# Patient Record
Sex: Female | Born: 1940 | Race: White | Hispanic: No | Smoking: Former smoker
Health system: Southern US, Community
[De-identification: ages and names within clinical notes are randomized; demographics above are authoritative.]

## PROBLEM LIST (undated history)

## (undated) DIAGNOSIS — Z952 Presence of prosthetic heart valve: Secondary | ICD-10-CM

## (undated) DIAGNOSIS — E039 Hypothyroidism, unspecified: Secondary | ICD-10-CM

## (undated) DIAGNOSIS — Z9289 Personal history of other medical treatment: Secondary | ICD-10-CM

## (undated) DIAGNOSIS — M810 Age-related osteoporosis without current pathological fracture: Secondary | ICD-10-CM

## (undated) DIAGNOSIS — K635 Polyp of colon: Secondary | ICD-10-CM

## (undated) DIAGNOSIS — I10 Essential (primary) hypertension: Secondary | ICD-10-CM

## (undated) DIAGNOSIS — F419 Anxiety disorder, unspecified: Secondary | ICD-10-CM

## (undated) DIAGNOSIS — Z8701 Personal history of pneumonia (recurrent): Secondary | ICD-10-CM

## (undated) DIAGNOSIS — K5732 Diverticulitis of large intestine without perforation or abscess without bleeding: Secondary | ICD-10-CM

## (undated) DIAGNOSIS — J449 Chronic obstructive pulmonary disease, unspecified: Secondary | ICD-10-CM

## (undated) DIAGNOSIS — R197 Diarrhea, unspecified: Secondary | ICD-10-CM

## (undated) DIAGNOSIS — I350 Nonrheumatic aortic (valve) stenosis: Secondary | ICD-10-CM

## (undated) DIAGNOSIS — K219 Gastro-esophageal reflux disease without esophagitis: Secondary | ICD-10-CM

## (undated) DIAGNOSIS — M479 Spondylosis, unspecified: Secondary | ICD-10-CM

## (undated) DIAGNOSIS — E782 Mixed hyperlipidemia: Secondary | ICD-10-CM

## (undated) HISTORY — DX: Nonrheumatic aortic (valve) stenosis: I35.0

## (undated) HISTORY — PX: OTHER SURGICAL HISTORY: SHX169

## (undated) HISTORY — DX: Polyp of colon: K63.5

## (undated) HISTORY — DX: Personal history of pneumonia (recurrent): Z87.01

## (undated) HISTORY — DX: Mixed hyperlipidemia: E78.2

## (undated) HISTORY — DX: Essential (primary) hypertension: I10

## (undated) HISTORY — DX: Diverticulitis of large intestine without perforation or abscess without bleeding: K57.32

## (undated) HISTORY — PX: LUMBAR DISC SURGERY: SHX700

## (undated) HISTORY — DX: Gastro-esophageal reflux disease without esophagitis: K21.9

## (undated) HISTORY — DX: Age-related osteoporosis without current pathological fracture: M81.0

## (undated) HISTORY — PX: APPENDECTOMY: SHX54

## (undated) HISTORY — DX: Chronic obstructive pulmonary disease, unspecified: J44.9

---

## 1970-10-18 HISTORY — PX: ABDOMINAL HYSTERECTOMY: SUR658

## 1998-08-20 ENCOUNTER — Inpatient Hospital Stay (HOSPITAL_COMMUNITY): Admission: EM | Admit: 1998-08-20 | Discharge: 1998-08-21 | Payer: Self-pay | Admitting: Cardiovascular Disease

## 1998-08-21 ENCOUNTER — Encounter: Payer: Self-pay | Admitting: Cardiovascular Disease

## 1999-05-29 ENCOUNTER — Ambulatory Visit (HOSPITAL_COMMUNITY): Admission: RE | Admit: 1999-05-29 | Discharge: 1999-05-29 | Payer: Self-pay | Admitting: Critical Care Medicine

## 1999-12-22 ENCOUNTER — Ambulatory Visit (HOSPITAL_COMMUNITY): Admission: RE | Admit: 1999-12-22 | Discharge: 1999-12-22 | Payer: Self-pay | Admitting: Gastroenterology

## 1999-12-22 ENCOUNTER — Encounter: Payer: Self-pay | Admitting: Gastroenterology

## 2005-10-07 ENCOUNTER — Ambulatory Visit: Payer: Self-pay | Admitting: Internal Medicine

## 2005-10-15 ENCOUNTER — Ambulatory Visit: Payer: Self-pay | Admitting: Internal Medicine

## 2005-10-15 ENCOUNTER — Ambulatory Visit (HOSPITAL_COMMUNITY): Admission: RE | Admit: 2005-10-15 | Discharge: 2005-10-15 | Payer: Self-pay | Admitting: Internal Medicine

## 2005-10-15 HISTORY — PX: COLONOSCOPY: SHX174

## 2005-10-15 HISTORY — PX: ESOPHAGOGASTRODUODENOSCOPY: SHX1529

## 2007-10-19 HISTORY — PX: ROTATOR CUFF REPAIR: SHX139

## 2007-11-02 ENCOUNTER — Ambulatory Visit (HOSPITAL_BASED_OUTPATIENT_CLINIC_OR_DEPARTMENT_OTHER): Admission: RE | Admit: 2007-11-02 | Discharge: 2007-11-02 | Payer: Self-pay | Admitting: Orthopedic Surgery

## 2008-06-11 ENCOUNTER — Ambulatory Visit: Payer: Self-pay | Admitting: Cardiology

## 2010-10-23 ENCOUNTER — Encounter (INDEPENDENT_AMBULATORY_CARE_PROVIDER_SITE_OTHER): Payer: Self-pay

## 2010-11-19 NOTE — Letter (Signed)
Summary: Recall Colonoscopy/Endoscopy, Change to Office Visit  [facility]  [address]   [place], [place] [address]   Phone: [phone]  Fax: [phone]      October 23, 2010   April Hull [address] [place], [place]  [address] April 01, 1941   Dear [name],   According to our records, it is time for you to schedule a Colonoscopy. However, after reviewing your medical record, we recommend an office visit in order to determine your need for a repeat procedure.  Please call [phone] at your convenience to schedule an office visit. If you have any questions or concerns, please feel free to contact our office.   Sincerely,   Cloria Spring LPN  [facility] Ph: [phone]   Fax: [phone]

## 2011-03-02 NOTE — Assessment & Plan Note (Signed)
[facility]                          [facility] OFFICE NOTE   April, Hull                       MRN:          [id number]  DATE:06/11/2008                            DOB:          1941-03-17    PRIMARY CARDIOLOGIST:  Jonelle Sidle, MD (new)   REFERRING PHYSICIAN:  Doreen Beam, MD   REASON FOR CONSULTATION:  April Hull is a very pleasant 70 year old  female, with no documented history of CAD, and whose husband was a long-  time patient of ours.  He passed away approximately 2 months ago and [name] is still experiencing significant grief.  It is in this context  that she presented to Dr. Sherril Croon recently, complaining of palpitations and  shortness of breath.  Preliminary workup consisted of both a 2-D echo  and an exercise stress Cardiolite.  She is now referred to Dr. [name] for further evaluation and recommendations.   The 2-D echo yielded normal LVF with no focal wall motion abnormalities  and no significant valvular disease.  The exercise stress Cardiolite,  during which the patient exercised for 7.5 minutes with no associated  chest pain, yielded normal perfusion and normal function (EF 53%).  There were no significant associated EKG changes during testing.   Clinically, April Hull informs me that predominately she has been  experiencing occasional palpitations, with some mild associated  symptoms.  However, these are not new and, in fact, she had been  evaluated for this symptom some 10 years ago here in our [facility].  She recalls being placed on an event monitor, which was reportedly  negative.   April Hull also refers to a remote cardiac catheterization at [facility],  some 10 years ago, which was also reportedly normal.   Clinically, she feels that she has noted some worsening shortness of  breath these past several months.  With respect to her chest pain, she  does report occasional chest pressure and  tightness when walking a  flight of stairs.  However, she did reiterate that she did not  experience any chest pain during her recent exercise stress test.   EKG in our office today reveals NSR at 79 bpm with normal axis and  nonspecific ST abnormalities.   CURRENT MEDICATIONS:  1. Levothyroxine 0.112 mg daily.  2. Nexium 20 inches daily.   PAST MEDICAL HISTORY:  1. GERD.  2. COPD.  3. Hypothyroidism.  4. Status post esophageal dilatation in 2006.  5. Status post lower back surgery.  6. Partial hysterectomy.  7. Longstanding palpitations.   SOCIAL HISTORY:  The patient is recently widowed, has 2 grown children.  She smokes half-a-pack a day.  Denies alcohol use.  Drinks 1 cup of  regular coffee a day as well as some occasional chocolate.  Typically,  does not drink regular tea.   FAMILY HISTORY:  Mother deceased age 61, with heart problems.  Six  brothers are deceased, 2 of whom had heart disease with 1 having  undergone CABG in his 30s.  The other died at age 4, fatal myocardial  infarction.   REVIEW OF SYSTEMS:  Denies history of myocardial infarction, congestive  heart failure, or stroke.  Denies history of hypertension or diabetes  mellitus.  Does have significant reflux disease.  Remaining systems  negative.   PHYSICAL EXAMINATION:  VITAL SIGNS:  Blood pressure currently 160/89,  pulse 81, regular weight 130.  GENERAL:  70 year old female, sitting upright, no distress.  HEENT:  Normocephalic, atraumatic.  NECK:  Palpable bilateral carotid pulses without bruits; no JVD.  LUNGS:  Clear to auscultation in all fields.  HEART:  RRR (S1S2) no significant murmurs.  No rubs or gallops.  ABDOMEN:  Soft, nontender, intact bowel sounds.  No bruits or pulsatile  epigastric mass.  EXTREMITIES:  Palpable bilateral femoral pulse without bruits.  Intact  distal pulses without edema.  No focal deficit.   Recent lab data notable for normal electrolytes, renal function, and  liver  enzymes.  Total cholesterol 293, triglyceride 146, HDL 46 and LDL  218.  TSH normal, CBC normal.   IMPRESSION:  1. Atypical chest pain.      a.     Recent normal adequate exercise stress Cardiolite; ejection       fraction 53%.      b.     Reportedly normal remote cardiac catheterization.  2. Exertional dyspnea.      a.     Normal left ventricular function.  3. Longstanding palpitations.  4. COPD/longstanding tobacco.  5. Hypertension.  6. Dyslipidemia.  7. Gastroesophageal reflux disease.   PLAN:  1. No further cardiac workup is recommended at this point in time.      The patient presents with atypical symptoms and had a recent      adequate stress test, which showed no definite evidence of      ischemia.  With respect to her palpitations, she is disinclined for      a reevaluation with a repeat monitor.  If, however, her episodes      increase in either frequency or intensity, then she is amenable to      having further evaluation with an event monitor.  2. Recommend starting low-dose aspirin 81 mg daily, for primary      prevention.  We will refer to Dr. Sherril Croon regarding treatment of      possible new onset hypertension.  I have instructed April Hull to      closely monitor this with outpatient ambulatory blood pressure      readings.  We also discussed the importance of lipid management,      and suggested initial treatment with diet/exercise regimen.  3. We also addressed the importance of smoking cessation, but      acknowledged that she continues to be in a grieving state over the      recent loss of her husband.  She clearly understands the long-term      impact of continued tobacco smoking, however.  4. We will have the patient return to Dr. [name] on an as-      needed basis.      Rozell Searing, PA-C  Electronically Signed      Jonelle Sidle, MD  Electronically Signed   GS/MedQ  DD: 06/11/2008  DT: 06/12/2008  Job #: [id number]

## 2011-03-02 NOTE — Op Note (Signed)
April Hull, April Hull                ACCOUNT NO.:  [id number]   MEDICAL RECORD NO.:  [id number]          PATIENT TYPE:  AMB   LOCATION:  DSC                          FACILITY:  [facility]   PHYSICIAN:  Rodney A. Mortenson, M.D.DATE OF BIRTH:  09-Oct-1941   DATE OF PROCEDURE:  11/02/2007  DATE OF DISCHARGE:                               OPERATIVE REPORT   JUSTIFICATION FOR PROCEDURE:  This 70 year old female presents with a  three-month history of left shoulder, left arm pain.  She has been  evaluated, has weakness to abduction and external rotation the shoulder.  An MRI shows arthritis of the glenohumeral joint and retracted tear of  the supraspinatus.  Because of persistent pain and discomfort, she was  now admitted for surgical reconstruction.  Complication discussed  preoperatively.  Questions answered and encouraged.   PREOPERATIVE DIAGNOSES:  1. Osteoarthritis, glenohumeral joint left shoulder.  2. Torn labrum.  3. Retracted tear supraspinatus left shoulder.   POSTOPERATIVE DIAGNOSES:  1. Osteoarthritis, glenohumeral joint left shoulder.  2. Torn labrum.  3. Retracted tear supraspinatus left shoulder.   OPERATION:  Arthroscopic debridement of the labrum; open acromioplasty  and open repair of rotator cuff left shoulder using Mitek anchor.   SURGEON:  Lenard Galloway. Chaney Malling, M.D.   ANESTHESIA:  General.   PROCEDURE:  The patient was placed on the operating table in the supine  position.  After satisfactory of general anesthesia, the patient was  placed in semi-sitting position.  The left shoulder and upper extremity  was prepped with DuraPrep and draped out in the usual manner.  Through  the standard posterior portal, an arthroscope was introduced and an  anterior operative portal was inserted.  Very careful examination of the  shoulder was undertaken.  There was an large area of total loss of  articular cartilage over the glenoid and a marked tearing of the  anterior labrum but  this was not torn.  Through the anterior portal, the  labrum was debrided with a full radius resector.  The posterior labrum  had some fraying and tearing.  This was also debrided.  A tear in the  rotator cuff could be seen just posterior to the biceps tendon.  The  biceps tendon itself appeared normal and the articular cartilage of the  humeral head was fairly normal.  This was a full thickness tear of the  supraspinatus.  The arthroscope was then removed.   Saber cut incision made over the anterolateral aspect of left shoulder.  Skin edges were retracted.  About 1 cm deltoid was released off the  anterior aspect of the acromion and a saw was used to do a small  acromioplasty.  Once this was accomplished, a bursectomy was completed.  There was a defect in the supraspinatus just posterior to the biceps  tendon.  This was opened and this was pulled off the footprint.  The  area of footprint was debrided with a rongeur.  The frayed and torn  material was then debrided sharply with a knife.  A Mitek anchor placed  over the footprint and the rotator cuff was  advanced distally and tacked  down in the anatomic position directly over the footprint.  This had an  excellent repair, good range of motion without impingement was  accomplished.  Again, this had an excellent stable repair.  Deltoid  fibers then reattached with 0 Vicryl, 2-0 Vicryl used to close the  subcutaneous tissue and stainless steel staples used to close the skin.  Sterile dressings were applied and the patient returned to the recovery  room doing extremely well.   DISPOSITION:  1. Discharge to home.  2. She may get out of the sling some during the day but wear it at      night.  3. Percocet for pain.  4. Follow-up in [place], [place], on Wednesday.      Rodney A. Chaney Malling, M.D.  Electronically Signed     RAM/MEDQ  D:  11/02/2007  T:  11/02/2007  Job:  [id number]   cc:   Doreen Beam, MD

## 2011-03-05 NOTE — Op Note (Signed)
[name], April Hull                ACCOUNT NO.:  [id number]   MEDICAL RECORD NO.:  [id number]          PATIENT TYPE:  AMB   LOCATION:  DAY                           FACILITY:  [facility]   PHYSICIAN:  Lionel December, M.D.    DATE OF BIRTH:  [birth year]   DATE OF PROCEDURE:  10/15/2005  DATE OF DISCHARGE:                                 OPERATIVE REPORT   PROCEDURE:  Esophagogastroduodenoscopy with esophageal dilation followed by  colonoscopy.   [name] is a 70 year old Caucasian female with chronic GERD whose symptoms  are poorly controlled with PPI. She also complains of intermittent solid  food dysphagia. She has a history of colonic polyps and overdue for  surveillance exam. She also has intermittent hematochezia.  Procedures were  reviewed the patient, informed consent was obtained.   MEDICINES FOR CONSCIOUS SEDATION:  Cetacaine spray pharyngeal topical  anesthesia, Demerol 25 milligrams IV, Versed 7 milligrams IV in divided  dose.   FINDINGS:  Procedures performed in endoscopy suite. The patient's vital  signs and O2 sat were monitored during procedure and remained stable.   PROCEDURE IN DETAIL:  Esophagogastroduodenoscopy. The patient was placed in  the left lateral position and Olympus videoscope was passed via oropharynx  without any difficulty into esophagus.  Mucosa of the esophagus was normal.  GE junction was at 41 cm from the incisors and was unremarkable.   Stomach. It was empty and distended very well with insufflation. Folds of  the proximal stomach were normal. Examination of mucosa at body, antrum,  pyloric channel, as well as angularis, fundus and cardia was normal.   Duodenum.  Bulbar mucosa was normal. Scope was passed and second part of the  duodenum where mucosa and folds were normal. Endoscope was withdrawn.   Esophagus was dilated by passing 54-French Maloney dilator to the full  insertion. As the dilator was withdrawn, endoscope was passed again and  esophageal mucosa reexamined and there was no disruption noted. Endoscope  was withdrawn and the patient prepared for procedure #2.   Colonoscopy. Rectal examination performed. No abnormality noted in external  or digital exam. Olympus videoscope was placed at the rectum and advanced  under vision into sigmoid colon beyond. Preparation was satisfactory.  Scattered diverticula noted at sigmoid colon. Scope was passed in the cecum  which was identified by appendiceal stump and ileocecal valve. Pictures  taken for the record. As the scope was withdrawn, colonic mucosa was  carefully examined. A single small polyp at transverse colon which was  ablated via cold biopsy. Mucosa and rest of colon and rectum was normal.  Scope was retroflexed to examine anorectal junction which was unremarkable.  There was thickening to anal papilla, otherwise normal. Endoscope was then  withdrawn. The patient tolerated the procedure well.   FINAL DIAGNOSES:  1.  Normal esophagogastroduodenoscopy.  2.  Esophagus dilated given history of dysphagia.  3.  Sigmoid colon diverticulosis.  4.  A small polyp ablated via cold biopsy from the transverse colon.   RECOMMENDATIONS:  1.  Antireflux measures. She needs to make every effort to quit cigarette  smoking which would help.  2.  Will increase Protonix to 40 milligrams p.o. b.i.d., prescription given      for a month with two refills.  3.  High-fiber diet. I will be contacting the patient with biopsy results      and plan to see him back in the office in 12 weeks.      Lionel December, M.D.  Electronically Signed     NR/MEDQ  D:  10/15/2005  T:  10/15/2005  Job:  [id number]   cc:   Dr. Doyne Keel

## 2011-07-08 LAB — POCT HEMOGLOBIN-HEMACUE: Hemoglobin: 13

## 2013-01-29 ENCOUNTER — Encounter: Payer: Self-pay | Admitting: Cardiology

## 2013-02-02 ENCOUNTER — Encounter: Payer: Self-pay | Admitting: Cardiology

## 2013-02-02 ENCOUNTER — Ambulatory Visit (INDEPENDENT_AMBULATORY_CARE_PROVIDER_SITE_OTHER): Payer: Medicare Other | Admitting: Cardiology

## 2013-02-02 VITALS — BP 139/82 | HR 80 | Ht 63.0 in | Wt 132.8 lb

## 2013-02-02 DIAGNOSIS — I350 Nonrheumatic aortic (valve) stenosis: Secondary | ICD-10-CM | POA: Insufficient documentation

## 2013-02-02 DIAGNOSIS — Z720 Tobacco use: Secondary | ICD-10-CM

## 2013-02-02 DIAGNOSIS — F17200 Nicotine dependence, unspecified, uncomplicated: Secondary | ICD-10-CM

## 2013-02-02 DIAGNOSIS — J449 Chronic obstructive pulmonary disease, unspecified: Secondary | ICD-10-CM | POA: Insufficient documentation

## 2013-02-02 DIAGNOSIS — I10 Essential (primary) hypertension: Secondary | ICD-10-CM

## 2013-02-02 DIAGNOSIS — J441 Chronic obstructive pulmonary disease with (acute) exacerbation: Secondary | ICD-10-CM

## 2013-02-02 DIAGNOSIS — I359 Nonrheumatic aortic valve disorder, unspecified: Secondary | ICD-10-CM

## 2013-02-02 NOTE — Patient Instructions (Addendum)
Your physician recommends that you schedule a follow-up appointment in: 3 months. Your physician recommends that you continue on your current medications as directed. Please refer to the Current Medication list given to you today. Your physician has requested that you have an echocardiogram in 3 months just before your next visit. Echocardiography is a painless test that uses sound waves to create images of your heart. It provides your doctor with information about the size and shape of your heart and how well your heart's chambers and valves are working. This procedure takes approximately one hour. There are no restrictions for this procedure. Our office will let you know if you need to have PFT's arranged at the hospital. This will be done if your family doctor has not already arranged this.

## 2013-02-02 NOTE — Assessment & Plan Note (Signed)
Agree that she needs PFTs and a chest x-ray. This will be important in future surgical consultations.

## 2013-02-02 NOTE — Assessment & Plan Note (Signed)
Reviewed recent echocardiogram report from [facility] internal medicine. Somewhat discordant information presented. She has relatively low gradients, calculated valve area of 1.14 cm, LVOT to AV peak velocity ratio 0.39. I suspect she has at least moderate aortic stenosis based on this. LVEF vigorous at 65-70%. Plan at this time will be to let her proceed with full physical and PFTs as this information will be important ultimately in terms of surgical intervention. A followup echocardiogram is going to be obtained through our office in 3 months, we can better review the images, and determine if any further studies are needed. I will see her back at that time.

## 2013-02-02 NOTE — Progress Notes (Signed)
Clinical Summary April Hull is a 72 y.o.female referred for cardiology consultation by Dr. Sheilah Mins. She underwent a recent echocardiogram demonstrating aortic stenosis and is referred for further discussion.  Recent echocardiogram done at [facility] internal medicine on 4/7 reports mild LVH with LVEF 65-70%, grade 1 diastolic dysfunction, mildly thickened mitral valve with mild mitral regurgitation, thickened and calcified aortic valve with mean gradient 11 mm mercury and peak gradient 24 mm mercury, LVOT to AV peak velocity ratio 0.39, valve area calculation based on velocity 1.14 cm, mild tricuspid regurgitation, RVSP 19 mm mercury. Cannot review the images at this time.  In terms of symptoms, she reports chronic shortness of breath over several months to years in the setting of tobacco use and underlying COPD, but has not been recently reassessed objectively. She reports chronic sinus problems, intermittent cough. No exertional chest pain. Does experience palpitations. She has had prior episodes of syncope around the time that she lost her husband 5 years ago. No exertional syncope.  She will be having a full physical with Dr. Sherril Croon soon, also PFTs.   Allergies  Allergen Reactions  . Bactrim (Sulfamethoxazole-Tmp Ds)   . Codeine Palpitations    Dizziness, sweats     Current Outpatient Prescriptions  Medication Sig Dispense Refill  . aspirin 81 MG tablet Take 81 mg by mouth. Takes 2-3 per week      . fish oil-omega-3 fatty acids 1000 MG capsule Take 1 g by mouth daily.      Marland Kitchen levothyroxine (SYNTHROID, LEVOTHROID) 100 MCG tablet Take 100 mcg by mouth daily before breakfast.       No current facility-administered medications for this visit.    Past Medical History  Diagnosis Date  . Essential hypertension, benign   . GERD (gastroesophageal reflux disease)   . COPD (chronic obstructive pulmonary disease)     Past Surgical History  Procedure Laterality Date  . Left shoulder surgery    .  Esophagogastroduodenoscopy    . Back surgery    . Abdominal hysterectomy      Family History  Problem Relation Age of Onset  . Heart attack Mother   . COPD Father   . CAD Brother     Social History Ms. Pimenta reports that she has been smoking Cigarettes.  She has been smoking about 0.00 packs per day. She does not have any smokeless tobacco history on file. Ms. Vandenheuvel reports that she does not drink alcohol.  Review of Systems No orthopnea or PND, no edema, no reported bleeding episodes. Otherwise negative except as outlined.  Physical Examination Filed Vitals:   02/02/13 0844  BP: 139/82  Pulse: 80   Filed Weights   02/02/13 0844  Weight: 132 lb 12.8 oz (60.238 kg)   No acute distress. HEENT: Conjunctiva and lids normal, oropharynx clear with moist mucosa. Neck: Supple, no elevated JVP or carotid bruits, no thyromegaly. Lungs: Clear to auscultation but diminished breath sound, nonlabored breathing at rest. Cardiac: Regular rate and rhythm, no S3, 2-3/6 systolic murmur with preserved second heart, no pericardial rub. Abdomen: Soft, nontender, bowel sounds present, no guarding or rebound. Extremities: No pitting edema, distal pulses 2+. Skin: Warm and dry. Musculoskeletal: No kyphosis. Neuropsychiatric: Alert and oriented x3, affect grossly appropriate.   Problem List and Plan   Aortic stenosis Reviewed recent echocardiogram report from [facility] internal medicine. Somewhat discordant information presented. She has relatively low gradients, calculated valve area of 1.14 cm, LVOT to AV peak velocity ratio 0.39. I suspect she has  at least moderate aortic stenosis based on this. LVEF vigorous at 65-70%. Plan at this time will be to let her proceed with full physical and PFTs as this information will be important ultimately in terms of surgical intervention. A followup echocardiogram is going to be obtained through our office in 3 months, we can better review the images, and  determine if any further studies are needed. I will see her back at that time.  Essential hypertension, benign No change to current regimen.  Tobacco abuse Patient states that she has cut back, but does not plan to quit smoking. We discussed this today.  COPD exacerbation Agree that she needs PFTs and a chest x-ray. This will be important in future surgical consultations.    Jonelle Sidle, M.D., F.A.C.C.

## 2013-02-02 NOTE — Assessment & Plan Note (Signed)
No change to current regimen. 

## 2013-02-02 NOTE — Assessment & Plan Note (Signed)
Patient states that she has cut back, but does not plan to quit smoking. We discussed this today.

## 2013-03-15 ENCOUNTER — Ambulatory Visit: Payer: Self-pay | Admitting: Cardiology

## 2013-04-18 ENCOUNTER — Other Ambulatory Visit (INDEPENDENT_AMBULATORY_CARE_PROVIDER_SITE_OTHER): Payer: Medicare Other

## 2013-04-18 ENCOUNTER — Other Ambulatory Visit: Payer: Self-pay

## 2013-04-18 DIAGNOSIS — I359 Nonrheumatic aortic valve disorder, unspecified: Secondary | ICD-10-CM

## 2013-04-18 DIAGNOSIS — I350 Nonrheumatic aortic (valve) stenosis: Secondary | ICD-10-CM

## 2013-04-19 ENCOUNTER — Telehealth: Payer: Self-pay | Admitting: *Deleted

## 2013-04-19 NOTE — Telephone Encounter (Signed)
Patient informed. 

## 2013-04-19 NOTE — Telephone Encounter (Signed)
Message copied by Eustace Moore on Thu Apr 19, 2013 10:12 AM ------      Message from: MCDOWELL, Illene Bolus      Created: Wed Apr 18, 2013  7:23 PM       Stable findings, moderate to severe aortic stenosis. Will review further at followup visit. ------

## 2013-04-23 ENCOUNTER — Ambulatory Visit (INDEPENDENT_AMBULATORY_CARE_PROVIDER_SITE_OTHER): Payer: Medicare Other | Admitting: Cardiology

## 2013-04-23 ENCOUNTER — Encounter: Payer: Self-pay | Admitting: Cardiology

## 2013-04-23 ENCOUNTER — Other Ambulatory Visit: Payer: Self-pay | Admitting: *Deleted

## 2013-04-23 ENCOUNTER — Telehealth: Payer: Self-pay | Admitting: Cardiology

## 2013-04-23 VITALS — BP 132/78 | HR 83 | Ht 63.0 in | Wt 133.0 lb

## 2013-04-23 DIAGNOSIS — F17200 Nicotine dependence, unspecified, uncomplicated: Secondary | ICD-10-CM

## 2013-04-23 DIAGNOSIS — J441 Chronic obstructive pulmonary disease with (acute) exacerbation: Secondary | ICD-10-CM

## 2013-04-23 DIAGNOSIS — R0602 Shortness of breath: Secondary | ICD-10-CM

## 2013-04-23 DIAGNOSIS — Z720 Tobacco use: Secondary | ICD-10-CM

## 2013-04-23 DIAGNOSIS — I359 Nonrheumatic aortic valve disorder, unspecified: Secondary | ICD-10-CM

## 2013-04-23 DIAGNOSIS — I350 Nonrheumatic aortic (valve) stenosis: Secondary | ICD-10-CM

## 2013-04-23 DIAGNOSIS — I10 Essential (primary) hypertension: Secondary | ICD-10-CM

## 2013-04-23 NOTE — Assessment & Plan Note (Signed)
Smoking cessation discussed 

## 2013-04-23 NOTE — Assessment & Plan Note (Signed)
Moderate to severe by recent followup echocardiogram. I would like to go ahead and have PFTs so that we have a sense of the significance of her COPD as this will impact eventually surgical management. Followup echocardiogram in symptom review in 6 months.

## 2013-04-23 NOTE — Telephone Encounter (Signed)
Mrs. April Hull called the office back very concerned about having upcoming surgery. States that her children are concerned about her waiting six more months before coming back to office for a follow up. She is requesting that Dr. Diona Browner call aer or advise if it is safe to her to wait (6) more months.

## 2013-04-23 NOTE — Telephone Encounter (Signed)
Patient discussed at visit with Dr Diona Browner today the possibility needing valve surgery in the future. Patient just wanted to make sure that it was ok to wait 6 months for follow up. Discussed with Dr Diona Browner and is ok to wait unless worsening symptoms. Advise patient.

## 2013-04-23 NOTE — Assessment & Plan Note (Signed)
PFTs being obtained.

## 2013-04-23 NOTE — Assessment & Plan Note (Signed)
No change to current regimen, keep followup with Dr. Vyas. 

## 2013-04-23 NOTE — Patient Instructions (Addendum)
Your physician has requested that you have an echocardiogram. Echocardiography is a painless test that uses sound waves to create images of your heart. It provides your doctor with information about the size and shape of your heart and how well your heart's chambers and valves are working. This procedure takes approximately one hour. There are no restrictions for this procedure.  Your physician wants you to follow-up in: 6 months after your echo You will receive a reminder letter in the mail two months in advance. If you don't receive a letter, please call our office to schedule the follow-up appointment.   Your physician has recommended that you have a pulmonary function test. Pulmonary Function Tests are a group of tests that measure how well air moves in and out of your lungs.

## 2013-04-23 NOTE — Progress Notes (Signed)
   Clinical Summary [name] is a 72 y.o.female last seen in April of this year. She reports stable NYHA class II dyspnea, intermittent cough. No exertional chest pain.  Recent echocardiogram on 7/2 revealed mild LVH with LVEF 65-70%, grade 1 diastolic dysfunction, functionally bicuspid aortic valve with moderate to severe aortic stenosis, mean gradient 16 mm mercury, valve area of 1.3 cm although smaller by planimetry, VTI ratio of LVOT to AV 0.41.  We reviewed the results of her echocardiogram. She has not had PFTs as yet to assess severity of her COPD.  Allergies  Allergen Reactions  . Bactrim (Sulfamethoxazole-Tmp Ds)   . Codeine Palpitations    Dizziness, sweats     Current Outpatient Prescriptions  Medication Sig Dispense Refill  . aspirin 81 MG tablet Take 81 mg by mouth. Takes 2-3 per week      . atorvastatin (LIPITOR) 10 MG tablet Take 10 mg by mouth daily.      Marland Kitchen levothyroxine (SYNTHROID, LEVOTHROID) 112 MCG tablet Take 112 mcg by mouth daily before breakfast.      . omeprazole (PRILOSEC) 40 MG capsule Take 40 mg by mouth daily.       No current facility-administered medications for this visit.    Past Medical History  Diagnosis Date  . Essential hypertension, benign   . GERD (gastroesophageal reflux disease)   . COPD (chronic obstructive pulmonary disease)   . Aortic stenosis     Social History [name] reports that she has been smoking Cigarettes.  She has been smoking about 0.00 packs per day. She does not have any smokeless tobacco history on file. [name] reports that she does not drink alcohol.  Review of Systems No palpitations, syncope, orthopnea, PND. Seasonal allergies. Stable appetite. Otherwise negative.  Physical Examination Filed Vitals:   04/23/13 0836  BP: 132/78  Pulse: 83   Filed Weights   04/23/13 0836  Weight: 133 lb (60.328 kg)    No acute distress.  HEENT: Conjunctiva and lids normal, oropharynx clear with moist mucosa.  Neck:  Supple, no elevated JVP or carotid bruits, no thyromegaly.  Lungs: Clear to auscultation but diminished breath sound, nonlabored breathing at rest.  Cardiac: Regular rate and rhythm, no S3, 2-3/6 systolic murmur with preserved second heart, no pericardial rub.  Abdomen: Soft, nontender, bowel sounds present, no guarding or rebound.  Extremities: No pitting edema, distal pulses 2+.  Skin: Warm and dry.  Musculoskeletal: No kyphosis.  Neuropsychiatric: Alert and oriented x3, affect grossly appropriate.   Problem List and Plan   Aortic stenosis Moderate to severe by recent followup echocardiogram. I would like to go ahead and have PFTs so that we have a sense of the significance of her COPD as this will impact eventually surgical management. Followup echocardiogram in symptom review in 6 months.  Essential hypertension, benign No change to current regimen, keep followup with Dr. Sherril Croon.  COPD exacerbation PFTs being obtained.  Tobacco abuse Smoking cessation discussed.    Jonelle Sidle, M.D., F.A.C.C.

## 2013-05-01 LAB — PULMONARY FUNCTION TEST

## 2013-05-09 ENCOUNTER — Telehealth: Payer: Self-pay | Admitting: *Deleted

## 2013-05-09 NOTE — Telephone Encounter (Signed)
Message copied by Eustace Moore on Wed May 09, 2013  8:18 AM ------      Message from: MCDOWELL, Illene Bolus      Created: Thu May 03, 2013  8:09 AM       Noted. This looks like a preliminary report. Uncertain if read by Dr. Orson Aloe or not. She does look to have a moderate obstructive lung defect, also decreased diffusion capacity. Would be consistent with COPD. Does not look like study quality was adequate due to patient's shortness of breath and difficulty performing the test. ------

## 2013-05-09 NOTE — Telephone Encounter (Signed)
Patient informed. Patient request copy be sent to PCP.

## 2013-10-24 ENCOUNTER — Other Ambulatory Visit (INDEPENDENT_AMBULATORY_CARE_PROVIDER_SITE_OTHER): Payer: Medicare Other

## 2013-10-24 ENCOUNTER — Other Ambulatory Visit: Payer: Self-pay

## 2013-10-24 DIAGNOSIS — I359 Nonrheumatic aortic valve disorder, unspecified: Secondary | ICD-10-CM

## 2013-10-24 DIAGNOSIS — I350 Nonrheumatic aortic (valve) stenosis: Secondary | ICD-10-CM

## 2013-10-25 ENCOUNTER — Telehealth: Payer: Self-pay | Admitting: *Deleted

## 2013-10-25 NOTE — Telephone Encounter (Signed)
Message copied by Eustace MooreANDERSON, Jevan Gaunt M on Thu Oct 25, 2013  4:25 PM ------      Message from: MCDOWELL, Illene BolusSAMUEL G      Created: Thu Oct 25, 2013  7:42 AM       Reviewed report. Relatively stable, moderate to severe aortic stenosis as noted previously. Can review further office followup. ------

## 2013-10-26 ENCOUNTER — Encounter: Payer: Self-pay | Admitting: Cardiology

## 2013-10-26 ENCOUNTER — Ambulatory Visit (INDEPENDENT_AMBULATORY_CARE_PROVIDER_SITE_OTHER): Payer: Medicare Other | Admitting: Cardiology

## 2013-10-26 VITALS — BP 134/82 | HR 87 | Ht 63.0 in | Wt 124.0 lb

## 2013-10-26 DIAGNOSIS — I10 Essential (primary) hypertension: Secondary | ICD-10-CM

## 2013-10-26 DIAGNOSIS — J449 Chronic obstructive pulmonary disease, unspecified: Secondary | ICD-10-CM

## 2013-10-26 DIAGNOSIS — F17200 Nicotine dependence, unspecified, uncomplicated: Secondary | ICD-10-CM

## 2013-10-26 DIAGNOSIS — I359 Nonrheumatic aortic valve disorder, unspecified: Secondary | ICD-10-CM

## 2013-10-26 DIAGNOSIS — Z720 Tobacco use: Secondary | ICD-10-CM

## 2013-10-26 DIAGNOSIS — I350 Nonrheumatic aortic (valve) stenosis: Secondary | ICD-10-CM

## 2013-10-26 NOTE — Telephone Encounter (Signed)
Discussed at OV today with Dr. Diona BrownerMcDowell.

## 2013-10-26 NOTE — Assessment & Plan Note (Signed)
Keep followup with Dr. Vyas. 

## 2013-10-26 NOTE — Assessment & Plan Note (Signed)
She is continuing to work on smoking cessation. Has cut back significantly.

## 2013-10-26 NOTE — Progress Notes (Signed)
    Clinical Summary Ms. Skibinski is a 73 y.o.female last seen in July 2014. She tells me that she is getting over a prolonged upper respiratory tract infection that has been managed by Dr. Sherril CroonVyas as an outpatient. From a cardiac perspective, no chest pain, no worsening shortness of breath over baseline. She is trying to continue to cut back smoking and quit at some point.  Recent followup echocardiogram in January showed moderate LVH with LVEF of 65-70%, grade 1 diastolic dysfunction, functionally bicuspid aortic valve with moderate to severe aortic stenosis, mean gradient 21 mm mercury, mild mitral regurgitation, mild tricuspid regurgitation. Overall relatively stable findings. We discussed this today. No clear indication to pursue surgical workup as yet.   Allergies  Allergen Reactions  . Bactrim [Sulfamethoxazole-Tmp Ds]   . Codeine Palpitations    Dizziness, sweats     Current Outpatient Prescriptions  Medication Sig Dispense Refill  . aspirin 81 MG tablet Take 81 mg by mouth. Takes 2-3 per week      . atorvastatin (LIPITOR) 10 MG tablet Take 10 mg by mouth daily.      Marland Kitchen. levothyroxine (SYNTHROID, LEVOTHROID) 100 MCG tablet Take 100 mcg by mouth daily before breakfast.      . omeprazole (PRILOSEC) 40 MG capsule Take 40 mg by mouth daily.       No current facility-administered medications for this visit.    Past Medical History  Diagnosis Date  . Essential hypertension, benign   . GERD (gastroesophageal reflux disease)   . COPD (chronic obstructive pulmonary disease)   . Aortic stenosis     Social History Ms. Yurkovich reports that she has been smoking Cigarettes.  She has been smoking about 0.00 packs per day. She does not have any smokeless tobacco history on file. Ms. Barrientes reports that she does not drink alcohol.  Review of Systems No palpitations or syncope. No orthopnea or leg edema. Otherwise as outlined.  Physical Examination Filed Vitals:   10/26/13 0855  BP: 134/82    Pulse: 87   Filed Weights   10/26/13 0855  Weight: 124 lb (56.246 kg)    No acute distress.  HEENT: Conjunctiva and lids normal, oropharynx clear with moist mucosa.  Neck: Supple, no elevated JVP or carotid bruits, no thyromegaly.  Lungs: Diminished breath sounds with scattered rhonchi, nonlabored breathing at rest.  Cardiac: Regular rate and rhythm, no S3, 3/6 systolic murmur with preserved second heart, no pericardial rub.  Abdomen: Soft, nontender, bowel sounds present, no guarding or rebound.  Extremities: No pitting edema, distal pulses 2+.  Skin: Warm and dry.  Musculoskeletal: No kyphosis.  Neuropsychiatric: Alert and oriented x3, affect grossly appropriate.   Problem List and Plan   Aortic stenosis Moderate to severe with relatively stable gradients. Plan is to continue observation, followup echocardiogram in 6 months with clinical visit.  Tobacco abuse She is continuing to work on smoking cessation. Has cut back significantly.  Essential hypertension, benign No change to current regimen.  COPD (chronic obstructive pulmonary disease) Keep followup with Dr. Sherril CroonVyas.    Jonelle SidleSamuel G. McDowell, M.D., F.A.C.C. \

## 2013-10-26 NOTE — Assessment & Plan Note (Signed)
No change to current regimen. 

## 2013-10-26 NOTE — Patient Instructions (Signed)
Continue all current medications. Echo - due in 6 months (just prior to next office visit) Your physician wants you to follow up in: 6 months.  You will receive a reminder letter in the mail one-two months in advance.  If you don't receive a letter, please call our office to schedule the follow up appointment

## 2013-10-26 NOTE — Assessment & Plan Note (Signed)
Moderate to severe with relatively stable gradients. Plan is to continue observation, followup echocardiogram in 6 months with clinical visit.

## 2014-01-15 ENCOUNTER — Encounter: Payer: Self-pay | Admitting: Cardiology

## 2014-02-11 ENCOUNTER — Encounter: Payer: Self-pay | Admitting: Pulmonary Disease

## 2014-02-11 ENCOUNTER — Ambulatory Visit (INDEPENDENT_AMBULATORY_CARE_PROVIDER_SITE_OTHER): Payer: Medicare Other | Admitting: Pulmonary Disease

## 2014-02-11 VITALS — BP 144/98 | HR 89 | Ht 63.0 in | Wt 133.8 lb

## 2014-02-11 DIAGNOSIS — I359 Nonrheumatic aortic valve disorder, unspecified: Secondary | ICD-10-CM

## 2014-02-11 DIAGNOSIS — R053 Chronic cough: Secondary | ICD-10-CM | POA: Insufficient documentation

## 2014-02-11 DIAGNOSIS — Z720 Tobacco use: Secondary | ICD-10-CM

## 2014-02-11 DIAGNOSIS — K219 Gastro-esophageal reflux disease without esophagitis: Secondary | ICD-10-CM

## 2014-02-11 DIAGNOSIS — J449 Chronic obstructive pulmonary disease, unspecified: Secondary | ICD-10-CM

## 2014-02-11 DIAGNOSIS — R058 Other specified cough: Secondary | ICD-10-CM

## 2014-02-11 DIAGNOSIS — I350 Nonrheumatic aortic (valve) stenosis: Secondary | ICD-10-CM

## 2014-02-11 DIAGNOSIS — R05 Cough: Secondary | ICD-10-CM

## 2014-02-11 DIAGNOSIS — F17200 Nicotine dependence, unspecified, uncomplicated: Secondary | ICD-10-CM

## 2014-02-11 DIAGNOSIS — J4489 Other specified chronic obstructive pulmonary disease: Secondary | ICD-10-CM

## 2014-02-11 DIAGNOSIS — R059 Cough, unspecified: Secondary | ICD-10-CM

## 2014-02-11 MED ORDER — OMEPRAZOLE 40 MG PO CPDR: 40.0000 mg | DELAYED_RELEASE_CAPSULE | Freq: Every day | ORAL | Status: DC

## 2014-02-11 MED ORDER — FLUTICASONE PROPIONATE 50 MCG/ACT NA SUSP: 2.0000 | Freq: Every day | NASAL | Status: DC

## 2014-02-11 MED ORDER — ALBUTEROL SULFATE HFA 108 (90 BASE) MCG/ACT IN AERS: 2.0000 | INHALATION_SPRAY | Freq: Four times a day (QID) | RESPIRATORY_TRACT | Status: DC | PRN

## 2014-02-11 NOTE — Progress Notes (Deleted)
   Subjective:    Patient ID: Jake BatheShelby J Yoo, female    DOB: 04/14/1941, 73 y.o.   MRN: [id number]  HPI    Review of Systems  Constitutional: Negative for fever and unexpected weight change.  HENT: Positive for congestion, ear pain, postnasal drip, rhinorrhea, sinus pressure and trouble swallowing. Negative for dental problem, nosebleeds, sneezing and sore throat.   Eyes: Negative for redness and itching.  Respiratory: Positive for cough and shortness of breath. Negative for chest tightness and wheezing.   Cardiovascular: Positive for palpitations. Negative for leg swelling.  Gastrointestinal: Negative for nausea and vomiting.  Genitourinary: Negative for dysuria.  Musculoskeletal: Negative for joint swelling.  Skin: Negative for rash.  Neurological: Negative for headaches.  Hematological: Does not bruise/bleed easily.  Psychiatric/Behavioral: Negative for dysphoric mood. The patient is not nervous/anxious.        Objective:   Physical Exam        Assessment & Plan:

## 2014-02-11 NOTE — Patient Instructions (Signed)
Use nasal irrigation (saline nasal spray) daily Flonase two sprays each nostril daily Proair two puffs up to four times per day as needed for cough, wheeze, or chest congestion Use omeprazole 30 to 45 minutes before first meal of the day Follow up in 6 weeks

## 2014-02-11 NOTE — Progress Notes (Signed)
Chief Complaint  Patient presents with  . Pulmonary Consult    Referred by Dr. Loreen Freudruv Vyas for chronic cough with thick clear mucous production x several months.    History of Present Illness: April Hull is a 73 y.o. female smoker for evaluation of chronic cough.  She has extensive history of smoking.  She started smoking as a teenager.  She smoked up to 1.5 packs per day, but is down to 3 cigarettes per day.  She had PFT's done several years ago, and was told she has COPD.  She has not been on inhaler therapy for this.  She developed a cough a few months ago.  She thinks this is related to her smoking, post-nasal drip, and reflux.  She is bringing up clear, stringy phlegm.  She is getting more frequent sputum production, and this is worse when she lays down.  She will also get episodes of wheezing, and this seems to happen more frequently.  She uses Ayr's nasal spray, but no other sinus regimen.  She takes 40 mg omeprazole at night.  She had EGD in 2006.  She denies prior history of allergies or asthma.  She denies history of pneumonia or exposure to tuberculosis.  She denies animal/bird exposures.  She is followed by Dr. Diona BrownerMcDowell with cardiology for aortic stenosis.  Tests: PFT 05/01/13 >> FEV1 1.54 (75%), FEV1% 64, TLC 5.04 (110%), RV 2.63 (140%), DLCO 58% Echo 10/24/13 >> mod LVH, EF 65 to 70%, grade 1 diastolic dysfx, mod AS, mild MR, mild TR CXR 01/15/14 >> normal   April Hull  has a past medical history of Hypertension; GERD (gastroesophageal reflux disease); Aortic stenosis; High cholesterol; Colon polyp; Sigmoid diverticulitis; Osteoporosis; and COPD (chronic obstructive pulmonary disease).  April Hull  has past surgical history that includes Left shoulder surgery (2006); Esophagogastroduodenoscopy (10/15/05); Vaginal hysterectomy; Colonoscopy (10/15/05); and Lumbar disc surgery.  Prior to Admission medications   Medication Sig Start Date End Date Taking? Authorizing  Provider  aspirin 81 MG tablet Take 81 mg by mouth. Takes 2-3 per week   Yes Historical Provider, MD  levothyroxine (SYNTHROID, LEVOTHROID) 100 MCG tablet Take 100 mcg by mouth daily before breakfast.   Yes Historical Provider, MD  omeprazole (PRILOSEC) 40 MG capsule Take 40 mg by mouth. 2-3 times per week.   Yes Historical Provider, MD    Allergies  Allergen Reactions  . Bactrim [Sulfamethoxazole-Tmp Ds]   . Codeine Palpitations    Dizziness, sweats     Her family history includes Breast cancer in her sister; CAD in her brother; COPD in her father; Emphysema in her father; Heart attack in her mother; Heart disease in her sister; Uterine cancer in her daughter.  She  reports that she has been smoking Cigarettes.  She has a 40 pack-year smoking history. She has never used smokeless tobacco. She reports that she does not drink alcohol or use illicit drugs.  Review of Systems  Constitutional: Negative for fever and unexpected weight change.  HENT: Positive for congestion, ear pain, postnasal drip, rhinorrhea, sinus pressure and trouble swallowing. Negative for dental problem, nosebleeds, sneezing and sore throat.   Eyes: Negative for redness and itching.  Respiratory: Positive for cough and shortness of breath. Negative for chest tightness and wheezing.   Cardiovascular: Positive for palpitations. Negative for leg swelling.  Gastrointestinal: Negative for nausea and vomiting.  Genitourinary: Negative for dysuria.  Musculoskeletal: Negative for joint swelling.  Skin: Negative for rash.  Neurological: Negative for headaches.  Hematological: Does not bruise/bleed easily.  Psychiatric/Behavioral: Negative for dysphoric mood. The patient is not nervous/anxious.    Physical Exam:  General - No distress ENT - No sinus tenderness, no oral exudate, no LAN, no thyromegaly, TM clear, pupils equal/reactive Cardiac - s1s2 regular, no murmur, pulses symmetric Chest - No wheeze/rales/dullness, good  air entry, normal respiratory excursion Back - No focal tenderness Abd - Soft, non-tender, no organomegaly, + bowel sounds Ext - No edema Neuro - Normal strength, cranial nerves intact Skin - No rashes Psych - Normal mood, and behavior  Assessment/Plan:  Coralyn HellingVineet Valene Villa, MD [facility]/Sleep Pager:  [phone]

## 2014-02-11 NOTE — Assessment & Plan Note (Signed)
Related to post-nasal drip, COPD, and reflux.   

## 2014-02-12 ENCOUNTER — Encounter: Payer: Self-pay | Admitting: Pulmonary Disease

## 2014-02-12 NOTE — Assessment & Plan Note (Signed)
Reviewed her PFT results with her.  Will have her try prn proair.  Depending on response will determine if she needs to try maintenance inhaler regimen.

## 2014-02-12 NOTE — Assessment & Plan Note (Signed)
Advised her to try using omeprazole 30 minutes before first meal of day.  Also discussed dietary restrictions with hx of reflux.  She may need additional GI evaluation if her cough and reflux persist.

## 2014-02-12 NOTE — Assessment & Plan Note (Signed)
Related to post-nasal drip.  Will have her use nasal irrigation and flonase.

## 2014-02-12 NOTE — Assessment & Plan Note (Signed)
Explained how continued tobacco use is contributing to her current symptoms.  Discussed options to help with smoking cessation.  She will consider these.  Will review again at next visit.

## 2014-02-12 NOTE — Assessment & Plan Note (Signed)
She will follow up with Dr. McDowell this Summer after having repeat Echo.   

## 2014-02-27 ENCOUNTER — Other Ambulatory Visit: Payer: Self-pay | Admitting: *Deleted

## 2014-02-27 DIAGNOSIS — I350 Nonrheumatic aortic (valve) stenosis: Secondary | ICD-10-CM

## 2014-04-08 ENCOUNTER — Ambulatory Visit (INDEPENDENT_AMBULATORY_CARE_PROVIDER_SITE_OTHER): Payer: Medicare Other | Admitting: Pulmonary Disease

## 2014-04-08 ENCOUNTER — Encounter: Payer: Self-pay | Admitting: Pulmonary Disease

## 2014-04-08 VITALS — BP 124/80 | HR 92 | Temp 97.9°F | Ht 63.0 in | Wt 132.6 lb

## 2014-04-08 DIAGNOSIS — J411 Mucopurulent chronic bronchitis: Secondary | ICD-10-CM

## 2014-04-08 DIAGNOSIS — R053 Chronic cough: Secondary | ICD-10-CM

## 2014-04-08 DIAGNOSIS — R05 Cough: Secondary | ICD-10-CM

## 2014-04-08 DIAGNOSIS — Z720 Tobacco use: Secondary | ICD-10-CM

## 2014-04-08 DIAGNOSIS — J418 Mixed simple and mucopurulent chronic bronchitis: Secondary | ICD-10-CM

## 2014-04-08 DIAGNOSIS — F17200 Nicotine dependence, unspecified, uncomplicated: Secondary | ICD-10-CM

## 2014-04-08 DIAGNOSIS — R058 Other specified cough: Secondary | ICD-10-CM

## 2014-04-08 DIAGNOSIS — R059 Cough, unspecified: Secondary | ICD-10-CM

## 2014-04-08 DIAGNOSIS — I359 Nonrheumatic aortic valve disorder, unspecified: Secondary | ICD-10-CM

## 2014-04-08 DIAGNOSIS — I350 Nonrheumatic aortic (valve) stenosis: Secondary | ICD-10-CM

## 2014-04-08 MED ORDER — FLUTICASONE FUROATE-VILANTEROL 100-25 MCG/INH IN AEPB: 1.0000 | INHALATION_SPRAY | Freq: Every day | RESPIRATORY_TRACT | Status: DC

## 2014-04-08 NOTE — Assessment & Plan Note (Signed)
Related to post-nasal drip, COPD, and reflux.

## 2014-04-08 NOTE — Assessment & Plan Note (Signed)
She will follow up with Dr. Diona BrownerMcDowell this Summer after having repeat Echo.

## 2014-04-08 NOTE — Assessment & Plan Note (Signed)
She has partial improvement with proair.  Will have her try scheduled Breo.  Discussed side effects to monitor for >> advised her to call if she [facility].

## 2014-04-08 NOTE — Patient Instructions (Signed)
Breo one puff daily >> rinse mouth after each use Proair two puffs as needed for cough, wheeze, or chest congestion Flonase two sprays each nostril daily as needed for sinus congestion, runny nose, or post-nasal drip Follow up in 4 months

## 2014-04-08 NOTE — Assessment & Plan Note (Signed)
Discussed options to help smoking cessation.  She feels she has too many issues to deal with at present, and is not ready to try to stop smoking.

## 2014-04-08 NOTE — Assessment & Plan Note (Signed)
She can use flonase prn.

## 2014-04-08 NOTE — Progress Notes (Signed)
Chief Complaint  Patient presents with  . Follow-up    Pt c/o productive cough with clear mucus, wheezing and SOB. No improvement since last OV. Pt states that she can only tell a small difference with new medications.     History of Present Illness: April Hull is a 73 y.o. female smoker with chronic cough from COPD, post-nasal drip, and reflux.  She also has hx of aortic stenosis.  She is still having cough.  Flonase and proair have helped.  She still smokes.  She brings up clear sputum.  She will wheeze some, and get winded.  She has notice intermittent feelings of palpitations and is going to f/u with cardiology.  TESTS: PFT 05/01/13 >> FEV1 1.54 (75%), FEV1% 64, TLC 5.04 (110%), RV 2.63 (140%), DLCO 58%  Echo 10/24/13 >> mod LVH, EF 65 to 70%, grade 1 diastolic dysfx, mod AS, mild MR, mild TR  CXR 01/15/14 >> normal    Soffia J Bledsoe  has a past medical history of Hypertension; GERD (gastroesophageal reflux disease); Aortic stenosis; High cholesterol; Colon polyp; Sigmoid diverticulitis; Osteoporosis; and COPD (chronic obstructive pulmonary disease).  Nestor RampShelby J Holderness  has past surgical history that includes Left shoulder surgery (2006); Esophagogastroduodenoscopy (10/15/05); Vaginal hysterectomy; Colonoscopy (10/15/05); and Lumbar disc surgery.  Prior to Admission medications   Medication Sig Start Date End Date Taking? Authorizing Shenika Quint  albuterol (PROAIR HFA) 108 (90 BASE) MCG/ACT inhaler Inhale 2 puffs into the lungs every 6 (six) hours as needed for wheezing or shortness of breath. 02/11/14  Yes Coralyn HellingVineet Sood, MD  aspirin 81 MG tablet Take 81 mg by mouth. Takes 2-3 per week   Yes Historical Ahtziry Saathoff, MD  fluticasone (FLONASE) 50 MCG/ACT nasal spray Place 2 sprays into both nostrils daily. 02/11/14  Yes Coralyn HellingVineet Sood, MD  levothyroxine (SYNTHROID, LEVOTHROID) 100 MCG tablet Take 100 mcg by mouth daily before breakfast.   Yes Historical Cammi Consalvo, MD  omeprazole (PRILOSEC) 40 MG capsule  Take 1 capsule (40 mg total) by mouth daily before breakfast. 2-3 times per week. 02/11/14  Yes Coralyn HellingVineet Sood, MD    Allergies  Allergen Reactions  . Bactrim [Sulfamethoxazole-Tmp Ds]   . Codeine Palpitations    Dizziness, sweats      Physical Exam:  General - No distress ENT - No sinus tenderness, no oral exudate, no LAN Cardiac - s1s2 regular, 2/6 SM Chest - No wheeze/rales/dullness Back - No focal tenderness Abd - Soft, non-tender Ext - No edema Neuro - Normal strength Skin - No rashes Psych - normal mood, and behavior   Assessment/Plan:  Coralyn HellingVineet Sood, MD [place] [facility]/Sleep Pager:  [phone]

## 2014-04-24 ENCOUNTER — Other Ambulatory Visit: Payer: Self-pay

## 2014-04-24 ENCOUNTER — Other Ambulatory Visit (INDEPENDENT_AMBULATORY_CARE_PROVIDER_SITE_OTHER): Payer: Medicare Other

## 2014-04-24 DIAGNOSIS — I359 Nonrheumatic aortic valve disorder, unspecified: Secondary | ICD-10-CM

## 2014-04-24 DIAGNOSIS — I059 Rheumatic mitral valve disease, unspecified: Secondary | ICD-10-CM

## 2014-04-24 DIAGNOSIS — I350 Nonrheumatic aortic (valve) stenosis: Secondary | ICD-10-CM

## 2014-05-02 ENCOUNTER — Encounter: Payer: Self-pay | Admitting: Cardiology

## 2014-05-02 ENCOUNTER — Ambulatory Visit (INDEPENDENT_AMBULATORY_CARE_PROVIDER_SITE_OTHER): Payer: Medicare Other | Admitting: Cardiology

## 2014-05-02 VITALS — BP 123/74 | HR 88 | Ht 63.0 in | Wt 132.4 lb

## 2014-05-02 DIAGNOSIS — I359 Nonrheumatic aortic valve disorder, unspecified: Secondary | ICD-10-CM

## 2014-05-02 DIAGNOSIS — J449 Chronic obstructive pulmonary disease, unspecified: Secondary | ICD-10-CM

## 2014-05-02 DIAGNOSIS — I350 Nonrheumatic aortic (valve) stenosis: Secondary | ICD-10-CM

## 2014-05-02 DIAGNOSIS — I10 Essential (primary) hypertension: Secondary | ICD-10-CM

## 2014-05-02 NOTE — Patient Instructions (Signed)
Your physician recommends that you schedule a follow-up appointment in: 6 months. You will receive a reminder letter in the mail in about 4 months reminding you to call and schedule your appointment. If you don't receive this letter, please contact our office. Your physician recommends that you continue on your current medications as directed. Please refer to the Current Medication list given to you today. Your physician has requested that you have an echocardiogram in 6 months just before your next visit. Echocardiography is a painless test that uses sound waves to create images of your heart. It provides your doctor with information about the size and shape of your heart and how well your heart's chambers and valves are working. This procedure takes approximately one hour. There are no restrictions for this procedure.  

## 2014-05-02 NOTE — Assessment & Plan Note (Signed)
Continue to work on smoking cessation. She is following with Dr. Craige CottaSood as well.

## 2014-05-02 NOTE — Progress Notes (Signed)
Clinical Summary April Hull is a 73 y.o.female last seen in January. She presents without any clear-cut exertional angina or progressive shortness of breath. Does tell me that she has been tired, sometimes lightheaded. 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  She has been trying to quit smoking completely, now down to 2 cigarettes a day. I congratulated her on her hard work.  Recent followup echocardiogram in July showed moderate LVH with LVEF 65-70%, grade 1 diastolic dysfunction with increased filling pressures, functionally bicuspid aortic valve with moderate to severe aortic stenosis, mean gradient 20 mm mercury, valve area 1.2 cm, MAC with mild mitral regurgitation, mild left atrial enlargement, trivial tricuspid regurgitation. We discussed the results.   Allergies  Allergen Reactions  . Bactrim [Sulfamethoxazole-Tmp Ds]   . Codeine Palpitations    Dizziness, sweats     Current Outpatient Prescriptions  Medication Sig Dispense Refill  . albuterol (PROAIR HFA) 108 (90 BASE) MCG/ACT inhaler Inhale 2 puffs into the lungs every 6 (six) hours as needed for wheezing or shortness of breath.  1 Inhaler  3  . aspirin 81 MG tablet Take 81 mg by mouth every other day.       . fluticasone (FLONASE) 50 MCG/ACT nasal spray Place 2 sprays into both nostrils daily.  16 g  2  . Fluticasone Furoate-Vilanterol (BREO ELLIPTA) 100-25 MCG/INH AEPB Inhale 1 puff into the lungs daily.  28 each  5  . levothyroxine (SYNTHROID, LEVOTHROID) 100 MCG tablet Take 100 mcg by mouth daily before breakfast.      . omeprazole (PRILOSEC) 40 MG capsule Take 40 mg by mouth every other day.       No current facility-administered medications for this visit.    Past Medical History  Diagnosis Date  . Essential hypertension, benign   . GERD (gastroesophageal reflux disease)   . Aortic stenosis   . Mixed hyperlipidemia   . Colon polyp   . Sigmoid diverticulitis   .  Osteoporosis   . COPD (chronic obstructive pulmonary disease)     Social History April Hull reports that she has been smoking Cigarettes.  She has a 40 pack-year smoking history. She has never used smokeless tobacco. April Hull reports that she does not drink alcohol.  Review of Systems No palpitations or syncope. Other systems reviewed and negative except as outlined.  Physical Examination Filed Vitals:   05/02/14 1003  BP: 123/74  Pulse: 88   Filed Weights   05/02/14 1003  Weight: 132 lb 6.4 oz (60.056 kg)    No acute distress.  HEENT: Conjunctiva and lids normal, oropharynx clear with moist mucosa.  Neck: Supple, no elevated JVP or carotid bruits, no thyromegaly.  Lungs: Diminished breath sounds with scattered rhonchi, nonlabored breathing at rest.  Cardiac: Regular rate and rhythm, no S3, 3/6 systolic murmur with preserved second heart, no pericardial rub.  Abdomen: Soft, nontender, bowel sounds present, no guarding or rebound.  Extremities: No pitting edema, distal pulses 2+.  Skin: Warm and dry.  Musculoskeletal: No kyphosis.  Neuropsychiatric: Alert and oriented x3, affect grossly appropriate.   Problem List and Plan   Aortic stenosis Stable, moderate to severe aortic stenosis, most recent mean gradient 20 mm mercury with valve area 1.2 cm. Clinical followup arranged in 6 months with repeat study at that time.  COPD (chronic obstructive pulmonary disease) Continue to work on smoking cessation. She is following with Dr. Craige CottaSood as well.  Essential  hypertension, benign Blood pressure is normal today.    Jonelle Sidle, M.D., F.A.C.C.

## 2014-05-02 NOTE — Assessment & Plan Note (Signed)
Blood pressure is normal today. 

## 2014-05-02 NOTE — Assessment & Plan Note (Signed)
Stable, moderate to severe aortic stenosis, most recent mean gradient 20 mm mercury with valve area 1.2 cm. Clinical followup arranged in 6 months with repeat study at that time.

## 2014-05-13 ENCOUNTER — Telehealth: Payer: Self-pay | Admitting: Cardiology

## 2014-05-13 NOTE — Telephone Encounter (Signed)
Patient is real shaky and fills like she is going to pass out.  Her eyes will not focus and she cant see good.  Last stated last week shortly after her visit with us.

## 2014-05-13 NOTE — Telephone Encounter (Signed)
Patient c/o not feeling well since her last visit. Patient said she didn't feel well when she saw cardiologist at last visit. Patient said her eyes aren't focusing good, she is shaky, and she feels like she is going to pass out. Patient informed nurse that she has sob all the time from her COPD. No c/o increased sob, no chest pain. Patient informed nurse that she feels like she did in the past when her vertigo would flare up. Nurse advised patient that she needed to contact her PCP with these symptoms and if her PCP felt that she needed to see her cardiologist, then they should contact our office for an urgent appointment. Patient verbalized understanding of plan.

## 2014-08-08 ENCOUNTER — Ambulatory Visit: Payer: Medicare Other | Admitting: Pulmonary Disease

## 2014-08-16 ENCOUNTER — Encounter: Payer: Self-pay | Admitting: Pulmonary Disease

## 2014-08-16 ENCOUNTER — Ambulatory Visit (INDEPENDENT_AMBULATORY_CARE_PROVIDER_SITE_OTHER): Payer: Medicare Other | Admitting: Pulmonary Disease

## 2014-08-16 VITALS — BP 128/78 | HR 77 | Temp 98.7°F | Ht 63.0 in | Wt 132.0 lb

## 2014-08-16 DIAGNOSIS — R05 Cough: Secondary | ICD-10-CM

## 2014-08-16 DIAGNOSIS — J449 Chronic obstructive pulmonary disease, unspecified: Secondary | ICD-10-CM

## 2014-08-16 DIAGNOSIS — Z720 Tobacco use: Secondary | ICD-10-CM

## 2014-08-16 DIAGNOSIS — R058 Other specified cough: Secondary | ICD-10-CM

## 2014-08-16 DIAGNOSIS — K2100 Gastro-esophageal reflux disease with esophagitis, without bleeding: Secondary | ICD-10-CM

## 2014-08-16 DIAGNOSIS — R053 Chronic cough: Secondary | ICD-10-CM

## 2014-08-16 NOTE — Patient Instructions (Signed)
Follow up in 6 months 

## 2014-08-16 NOTE — Assessment & Plan Note (Signed)
She has mild changes on PFT and no significant response to trial of inhaler therapy.  She can continue prn albuterol.

## 2014-08-16 NOTE — Assessment & Plan Note (Signed)
Advised her to use flonase and nasal irrigation on a regular basis.

## 2014-08-16 NOTE — Assessment & Plan Note (Signed)
She will d/w her PCP about getting referral to GI for further assessment.  She has previously been seen by Dr. Karilyn Cotaehman.  I am concerned that it will be difficult to control her cough symptoms until her GI issues are further addressed.

## 2014-08-16 NOTE — Assessment & Plan Note (Signed)
Related to post-nasal drip, COPD, and reflux.  It seems like reflux is the main issue at present.   She has been on ACE inhibitor for past several months, but her cough symptoms predate this and she has not noticed her cough getting any different since being on ACE inhibitor.

## 2014-08-16 NOTE — Progress Notes (Signed)
Chief Complaint  Patient presents with  . Follow-up    Pt reports increased cough. Pt states that PCP gave her a Rx for Pred and Levaquin 500 and a depo injection for increased breathing issues and cough. Both completed(7 day course) Pt taking Lisinopril 10mg . Used sample of Breo, did not work.     History of Present Illness: April Hull is a 73 y.o. female smoker with chronic cough from COPD, post-nasal drip, and reflux.  She also has hx of aortic stenosis.  She was in hospital in July for elevated blood pressure.  She was started on lisinopril then.  She continues to have trouble with her cough.  She tried breo >> no help.  She has tried using albuterol >> not much improvement.  She continues to have sinus congestion and post-nasal drip.  She has been using flonase.  She feels her main problem is related to reflux.  She says that she has a severely restricted diet >> otherwise when she eats then she feels burning, bitter liquid come back up her throat.  She has tried using PPI's, but these don't seem to help much.  She also has difficulty with constipation.  She was seen by Dr. Karilyn Cotaehman with GI for colonoscopy about 8 years ago.  She is down to 2 cigarettes per day.  TESTS: PFT 05/01/13 >> FEV1 1.54 (75%), FEV1% 64, TLC 5.04 (110%), RV 2.63 (140%), DLCO 58%  Echo 10/24/13 >> mod LVH, EF 65 to 70%, grade 1 diastolic dysfx, mod AS, mild MR, mild TR  CXR 01/15/14 >> normal   PMHx, PSHx, Medications, Allergies, Fhx, Shx reviewed.  Physical Exam:  General - No distress ENT - No sinus tenderness, no oral exudate, no LAN Cardiac - s1s2 regular, 2/6 SM Chest - No wheeze/rales/dullness Back - No focal tenderness Abd - Soft, non-tender Ext - No edema Neuro - Normal strength Skin - No rashes Psych - normal mood, and behavior   Assessment/Plan:  April HellingVineet Maegan Buller, MD [facility] Pager:  [phone]

## 2014-08-16 NOTE — Assessment & Plan Note (Signed)
Encouraged her to continue her smoking cessation efforts. 

## 2014-09-13 ENCOUNTER — Other Ambulatory Visit: Payer: Self-pay | Admitting: Pulmonary Disease

## 2014-10-21 ENCOUNTER — Encounter: Payer: Self-pay | Admitting: Cardiology

## 2014-10-21 ENCOUNTER — Ambulatory Visit (INDEPENDENT_AMBULATORY_CARE_PROVIDER_SITE_OTHER): Payer: Medicare Other | Admitting: Cardiology

## 2014-10-21 VITALS — BP 107/66 | HR 97 | Ht 63.0 in | Wt 120.0 lb

## 2014-10-21 DIAGNOSIS — I10 Essential (primary) hypertension: Secondary | ICD-10-CM

## 2014-10-21 DIAGNOSIS — I350 Nonrheumatic aortic (valve) stenosis: Secondary | ICD-10-CM

## 2014-10-21 NOTE — Patient Instructions (Signed)
   Your physician has requested that you have an echocardiogram. Echocardiography is a painless test that uses sound waves to create images of your heart. It provides your doctor with information about the size and shape of your heart and how well your heart's chambers and valves are working. This procedure takes approximately one hour. There are no restrictions for this procedure. Office will contact with results via phone or letter.   Continue all current medications. Your physician wants you to follow up in: 6 months.  You will receive a reminder letter in the mail one-two months in advance.  If you don't receive a letter, please call our office to schedule the follow up appointment     

## 2014-10-21 NOTE — Progress Notes (Signed)
Reason for visit: Aortic stenosis  Clinical Summary April Hull is a 74 y.o.female last seen in July 2015. She presents for a routine follow-up visit. From a cardiac perspective, no change in baseline dyspnea on exertion, no angina symptoms or palpitations. She continues on aspirin and Zestril.  She reports having problems with diverticulitis over the last few months, is under the care of Dr. Teena Dunk at this time, and continues on an antibiotic regimen. She anticipates colonoscopy in approximately 3 weeks.  Echocardiogram from July 2015 showed moderate LVH with LVEF 65-70%, grade 1 diastolic dysfunction with increased filling pressures, functionally bicuspid aortic valve with moderate to severe aortic stenosis, mean gradient 20 mm mercury, valve area 1.2 cm, MAC with mild mitral regurgitation, mild left atrial enlargement, trivial tricuspid regurgitation. She is due for a follow-up study.  Allergies  Allergen Reactions  . Bactrim [Sulfamethoxazole-Trimethoprim]   . Codeine Palpitations    Dizziness, sweats     Current Outpatient Prescriptions  Medication Sig Dispense Refill  . albuterol (PROAIR HFA) 108 (90 BASE) MCG/ACT inhaler Inhale 2 puffs into the lungs every 6 (six) hours as needed for wheezing or shortness of breath. 1 Inhaler 3  . aspirin 81 MG tablet Take 81 mg by mouth every other day.     . benzonatate (TESSALON) 100 MG capsule Take 100 mg by mouth 3 (three) times daily as needed for cough.    . calcium carbonate (TUMS EX) 750 MG chewable tablet Chew 1 tablet by mouth daily.    . fluticasone (FLONASE) 50 MCG/ACT nasal spray USE TWO SPRAY(S) IN EACH NOSTRIL ONCE DAILY 16 g 5  . levothyroxine (SYNTHROID, LEVOTHROID) 100 MCG tablet Take 100 mcg by mouth daily before breakfast.    . lisinopril (PRINIVIL,ZESTRIL) 10 MG tablet Take 10 mg by mouth daily.     No current facility-administered medications for this visit.    Past Medical History  Diagnosis Date  . Essential  hypertension, benign   . GERD (gastroesophageal reflux disease)   . Aortic stenosis   . Mixed hyperlipidemia   . Colon polyp   . Sigmoid diverticulitis   . Osteoporosis   . COPD (chronic obstructive pulmonary disease)     Social History Ms. Sweetin reports that she has been smoking Cigarettes.  She started smoking about 58 years ago. She has a 10 pack-year smoking history. She has never used smokeless tobacco. Ms. Salasar reports that she does not drink alcohol.  Review of Systems Complete review of systems negative except as otherwise outlined in the clinical summary and also the following. Reports problems with appetite, also constipation. No recent fevers or chills.  Physical Examination Filed Vitals:   10/21/14 0954  BP: 107/66  Pulse: 97   Filed Weights   10/21/14 0954  Weight: 120 lb (54.432 kg)    No acute distress.  HEENT: Conjunctiva and lids normal, oropharynx clear with moist mucosa.  Neck: Supple, no elevated JVP or carotid bruits, no thyromegaly.  Lungs: Diminished breath sounds with scattered rhonchi, nonlabored breathing at rest.  Cardiac: Regular rate and rhythm, no S3, 3/6 systolic murmur with preserved second heart, no pericardial rub.  Abdomen: Soft, nontender, bowel sounds present, no guarding or rebound.  Extremities: No pitting edema, distal pulses 2+.  Skin: Warm and dry.  Musculoskeletal: No kyphosis.  Neuropsychiatric: Alert and oriented x3, affect grossly appropriate.   Problem List and Plan   Aortic stenosis Symptomatically stable with moderate to severe aortic stenosis as outlined above. We will obtain  a follow-up echocardiogram for continued surveillance. I anticipate that she should be able to go ahead with planned colonoscopy in the next 3 weeks with Dr. Teena Dunk. No clear indication for SBE prophylaxis based on guidelines. We will continue six-month follow-up visits for now.  Essential hypertension, benign Blood pressure is well  controlled today.    Jonelle Sidle, M.D., F.A.C.C.

## 2014-10-21 NOTE — Assessment & Plan Note (Signed)
Blood pressure is well-controlled today. 

## 2014-10-21 NOTE — Assessment & Plan Note (Signed)
Symptomatically stable with moderate to severe aortic stenosis as outlined above. We will obtain a follow-up echocardiogram for continued surveillance. I anticipate that she should be able to go ahead with planned colonoscopy in the next 3 weeks with Dr. Teena Dunk. No clear indication for SBE prophylaxis based on guidelines. We will continue six-month follow-up visits for now.

## 2014-10-23 ENCOUNTER — Other Ambulatory Visit: Payer: Self-pay

## 2014-10-23 ENCOUNTER — Other Ambulatory Visit (INDEPENDENT_AMBULATORY_CARE_PROVIDER_SITE_OTHER): Payer: Medicare Other

## 2014-10-23 DIAGNOSIS — I350 Nonrheumatic aortic (valve) stenosis: Secondary | ICD-10-CM

## 2014-10-23 DIAGNOSIS — J449 Chronic obstructive pulmonary disease, unspecified: Secondary | ICD-10-CM

## 2014-10-24 ENCOUNTER — Telehealth: Payer: Self-pay | Admitting: *Deleted

## 2014-10-24 NOTE — Telephone Encounter (Signed)
-----   Message from Jonelle SidleSamuel G McDowell, MD sent at 10/24/2014  1:07 PM EST ----- Reviewed report. Please let her know that there does not look to be any significant progression in degree of aortic stenosis. We will continue observation for now.

## 2014-10-25 NOTE — Telephone Encounter (Signed)
Patient informed. 

## 2014-11-22 ENCOUNTER — Encounter (HOSPITAL_COMMUNITY): Payer: Self-pay | Admitting: General Practice

## 2014-11-22 ENCOUNTER — Inpatient Hospital Stay (HOSPITAL_COMMUNITY)
Admission: RE | Admit: 2014-11-22 | Discharge: 2014-12-09 | DRG: 329 | Disposition: A | Discharge status: Skilled Nursing Facility | Payer: Medicare Other | Source: Ambulatory Visit | Attending: General Surgery | Admitting: General Surgery

## 2014-11-22 ENCOUNTER — Other Ambulatory Visit (INDEPENDENT_AMBULATORY_CARE_PROVIDER_SITE_OTHER): Payer: Self-pay | Admitting: Surgery

## 2014-11-22 ENCOUNTER — Inpatient Hospital Stay (HOSPITAL_COMMUNITY): Payer: Medicare Other

## 2014-11-22 DIAGNOSIS — R1110 Vomiting, unspecified: Secondary | ICD-10-CM

## 2014-11-22 DIAGNOSIS — Z7951 Long term (current) use of inhaled steroids: Secondary | ICD-10-CM | POA: Diagnosis not present

## 2014-11-22 DIAGNOSIS — E871 Hypo-osmolality and hyponatremia: Secondary | ICD-10-CM

## 2014-11-22 DIAGNOSIS — I10 Essential (primary) hypertension: Secondary | ICD-10-CM | POA: Diagnosis present

## 2014-11-22 DIAGNOSIS — K56609 Unspecified intestinal obstruction, unspecified as to partial versus complete obstruction: Secondary | ICD-10-CM | POA: Diagnosis present

## 2014-11-22 DIAGNOSIS — D72829 Elevated white blood cell count, unspecified: Secondary | ICD-10-CM | POA: Diagnosis present

## 2014-11-22 DIAGNOSIS — E039 Hypothyroidism, unspecified: Secondary | ICD-10-CM | POA: Diagnosis present

## 2014-11-22 DIAGNOSIS — D649 Anemia, unspecified: Secondary | ICD-10-CM | POA: Diagnosis present

## 2014-11-22 DIAGNOSIS — R058 Other specified cough: Secondary | ICD-10-CM | POA: Diagnosis present

## 2014-11-22 DIAGNOSIS — K219 Gastro-esophageal reflux disease without esophagitis: Secondary | ICD-10-CM | POA: Diagnosis present

## 2014-11-22 DIAGNOSIS — F17210 Nicotine dependence, cigarettes, uncomplicated: Secondary | ICD-10-CM | POA: Diagnosis present

## 2014-11-22 DIAGNOSIS — K630 Abscess of intestine: Secondary | ICD-10-CM | POA: Diagnosis present

## 2014-11-22 DIAGNOSIS — Z7982 Long term (current) use of aspirin: Secondary | ICD-10-CM

## 2014-11-22 DIAGNOSIS — Z01811 Encounter for preprocedural respiratory examination: Secondary | ICD-10-CM

## 2014-11-22 DIAGNOSIS — K5732 Diverticulitis of large intestine without perforation or abscess without bleeding: Principal | ICD-10-CM | POA: Diagnosis present

## 2014-11-22 DIAGNOSIS — J449 Chronic obstructive pulmonary disease, unspecified: Secondary | ICD-10-CM | POA: Diagnosis present

## 2014-11-22 DIAGNOSIS — M810 Age-related osteoporosis without current pathological fracture: Secondary | ICD-10-CM | POA: Diagnosis present

## 2014-11-22 DIAGNOSIS — K5660 Unspecified intestinal obstruction: Secondary | ICD-10-CM

## 2014-11-22 DIAGNOSIS — R05 Cough: Secondary | ICD-10-CM | POA: Diagnosis present

## 2014-11-22 DIAGNOSIS — E782 Mixed hyperlipidemia: Secondary | ICD-10-CM | POA: Diagnosis present

## 2014-11-22 DIAGNOSIS — R140 Abdominal distension (gaseous): Secondary | ICD-10-CM

## 2014-11-22 DIAGNOSIS — E43 Unspecified severe protein-calorie malnutrition: Secondary | ICD-10-CM | POA: Insufficient documentation

## 2014-11-22 DIAGNOSIS — K5780 Diverticulitis of intestine, part unspecified, with perforation and abscess without bleeding: Secondary | ICD-10-CM | POA: Insufficient documentation

## 2014-11-22 DIAGNOSIS — Z01810 Encounter for preprocedural cardiovascular examination: Secondary | ICD-10-CM

## 2014-11-22 DIAGNOSIS — K5669 Other intestinal obstruction: Secondary | ICD-10-CM | POA: Diagnosis present

## 2014-11-22 DIAGNOSIS — Z720 Tobacco use: Secondary | ICD-10-CM | POA: Diagnosis present

## 2014-11-22 DIAGNOSIS — I350 Nonrheumatic aortic (valve) stenosis: Secondary | ICD-10-CM

## 2014-11-22 DIAGNOSIS — R0602 Shortness of breath: Secondary | ICD-10-CM

## 2014-11-22 HISTORY — DX: Spondylosis, unspecified: M47.9

## 2014-11-22 LAB — COMPREHENSIVE METABOLIC PANEL
ALBUMIN: 2.9 g/dL — AB (ref 3.5–5.2)
ALT: 11 U/L (ref 0–35)
AST: 20 U/L (ref 0–37)
Alkaline Phosphatase: 88 U/L (ref 39–117)
Anion gap: 9 (ref 5–15)
BUN: 11 mg/dL (ref 6–23)
CO2: 27 mmol/L (ref 19–32)
Calcium: 9.5 mg/dL (ref 8.4–10.5)
Chloride: 96 mmol/L (ref 96–112)
Creatinine, Ser: 0.53 mg/dL (ref 0.50–1.10)
Glucose, Bld: 94 mg/dL (ref 70–99)
POTASSIUM: 4 mmol/L (ref 3.5–5.1)
SODIUM: 132 mmol/L — AB (ref 135–145)
Total Bilirubin: 0.5 mg/dL (ref 0.3–1.2)
Total Protein: 6.6 g/dL (ref 6.0–8.3)

## 2014-11-22 LAB — CBC
HEMATOCRIT: 36.8 % (ref 36.0–46.0)
Hemoglobin: 11.8 g/dL — ABNORMAL LOW (ref 12.0–15.0)
MCH: 28.4 pg (ref 26.0–34.0)
MCHC: 32.1 g/dL (ref 30.0–36.0)
MCV: 88.7 fL (ref 78.0–100.0)
PLATELETS: 403 10*3/uL — AB (ref 150–400)
RBC: 4.15 MIL/uL (ref 3.87–5.11)
RDW: 14 % (ref 11.5–15.5)
WBC: 8.9 10*3/uL (ref 4.0–10.5)

## 2014-11-22 MED ORDER — SODIUM CHLORIDE 0.9 % IJ SOLN
10.0000 mL | INTRAMUSCULAR | Status: DC | PRN
  Administered 2014-11-23 – 2014-12-05 (×12): 10 mL
  Filled 2014-11-22 (×12): qty 40

## 2014-11-22 MED ORDER — POTASSIUM CHLORIDE IN NACL 20-0.9 MEQ/L-% IV SOLN
INTRAVENOUS | Status: AC
  Administered 2014-11-22 – 2014-11-23 (×2): via INTRAVENOUS
  Filled 2014-11-22 (×5): qty 1000

## 2014-11-22 MED ORDER — IOHEXOL 300 MG/ML  SOLN
100.0000 mL | Freq: Once | INTRAMUSCULAR | Status: AC | PRN
  Administered 2014-11-22: 100 mL via INTRAVENOUS

## 2014-11-22 MED ORDER — METRONIDAZOLE IN NACL 5-0.79 MG/ML-% IV SOLN
500.0000 mg | Freq: Three times a day (TID) | INTRAVENOUS | Status: DC
  Administered 2014-11-22 – 2014-12-05 (×38): 500 mg via INTRAVENOUS
  Filled 2014-11-22 (×44): qty 100

## 2014-11-22 MED ORDER — PANTOPRAZOLE SODIUM 40 MG IV SOLR
40.0000 mg | Freq: Every day | INTRAVENOUS | Status: DC
  Administered 2014-11-22 – 2014-12-05 (×14): 40 mg via INTRAVENOUS
  Filled 2014-11-22 (×17): qty 40

## 2014-11-22 MED ORDER — CIPROFLOXACIN IN D5W 400 MG/200ML IV SOLN
400.0000 mg | Freq: Two times a day (BID) | INTRAVENOUS | Status: DC
  Administered 2014-11-22 – 2014-12-05 (×25): 400 mg via INTRAVENOUS
  Filled 2014-11-22 (×27): qty 200

## 2014-11-22 MED ORDER — MORPHINE SULFATE 2 MG/ML IJ SOLN
1.0000 mg | INTRAMUSCULAR | Status: DC | PRN
  Administered 2014-11-23 – 2014-12-04 (×11): 2 mg via INTRAVENOUS
  Administered 2014-12-05: 4 mg via INTRAVENOUS
  Administered 2014-12-05 – 2014-12-06 (×2): 2 mg via INTRAVENOUS
  Filled 2014-11-22 (×6): qty 1
  Filled 2014-11-22: qty 2
  Filled 2014-11-22 (×8): qty 1

## 2014-11-22 MED ORDER — ENOXAPARIN SODIUM 30 MG/0.3ML ~~LOC~~ SOLN
30.0000 mg | SUBCUTANEOUS | Status: DC
  Administered 2014-11-22: 30 mg via SUBCUTANEOUS
  Filled 2014-11-22 (×2): qty 0.3

## 2014-11-22 MED ORDER — ENOXAPARIN SODIUM 40 MG/0.4ML ~~LOC~~ SOLN: 40.0000 mg | SUBCUTANEOUS | Status: DC

## 2014-11-22 MED ORDER — ARFORMOTEROL TARTRATE 15 MCG/2ML IN NEBU
15.0000 ug | INHALATION_SOLUTION | Freq: Two times a day (BID) | RESPIRATORY_TRACT | Status: DC
  Administered 2014-11-22 – 2014-11-25 (×6): 15 ug via RESPIRATORY_TRACT
  Filled 2014-11-22 (×8): qty 2

## 2014-11-22 MED ORDER — BUDESONIDE 0.25 MG/2ML IN SUSP
0.2500 mg | Freq: Two times a day (BID) | RESPIRATORY_TRACT | Status: DC
  Administered 2014-11-22 – 2014-11-25 (×6): 0.25 mg via RESPIRATORY_TRACT
  Filled 2014-11-22 (×9): qty 2

## 2014-11-22 MED ORDER — IOHEXOL 300 MG/ML  SOLN
25.0000 mL | INTRAMUSCULAR | Status: AC
  Administered 2014-11-22 (×2): 25 mL via ORAL

## 2014-11-22 MED ORDER — SODIUM CHLORIDE 0.9 % IJ SOLN
10.0000 mL | Freq: Two times a day (BID) | INTRAMUSCULAR | Status: DC
  Administered 2014-11-23 – 2014-12-04 (×10): 10 mL

## 2014-11-22 MED ORDER — ONDANSETRON HCL 4 MG/2ML IJ SOLN
4.0000 mg | Freq: Four times a day (QID) | INTRAMUSCULAR | Status: DC | PRN
  Administered 2014-11-22 – 2014-12-04 (×3): 4 mg via INTRAVENOUS
  Filled 2014-11-22 (×2): qty 2

## 2014-11-22 NOTE — H&P (Signed)
April Hull is an 74 y.o. female.   Chief Complaint: large bowel obstruction HPI: this is a 74 year old female that [place] secondary to a colonic stricture.  She is accompanied by her daughter.  Apparently, she started having bouts of diverticulitis in October 2015.  She has been on Antibiotics since thenand is even had [facility].   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  Currently, she has not had a bowel movement in several weeks.  She is only keeping down Enure.  She reports left lower quadrant abdominal pain.  Past Medical History  Diagnosis Date  . Essential hypertension, benign   . GERD (gastroesophageal reflux disease)   . Aortic stenosis   . Mixed hyperlipidemia   . Colon polyp   . Sigmoid diverticulitis   . Osteoporosis   . COPD (chronic obstructive pulmonary disease)     Past Surgical History  Procedure Laterality Date  . Left shoulder surgery  2006  . Esophagogastroduodenoscopy  10/15/05  . Vaginal hysterectomy    . Colonoscopy  10/15/05  . Lumbar disc surgery      x 2    Family History  Problem Relation Age of Onset  . Heart attack Mother   . COPD Father   . CAD Brother   . Emphysema Father   . Breast cancer Sister   . Heart disease Sister   . Uterine cancer Daughter    Social History:  reports that she has been smoking Cigarettes.  She started smoking about 58 years ago. She has a 10 pack-year smoking history. She has never used smokeless tobacco. She reports that she does not drink alcohol or use illicit drugs.  Allergies:  Allergies  Allergen Reactions  . Bactrim [Sulfamethoxazole-Trimethoprim]   . Codeine Palpitations    Dizziness, sweats      (Not in a hospital admission)  No results found for this or any previous visit (from the past 48 hour(s)). No results  found.  Review of Systems  Respiratory: Positive for cough and shortness of breath.   Gastrointestinal: Positive for nausea, vomiting, abdominal pain and constipation.    There were no vitals taken for this visit. Physical Exam  Constitutional: She is oriented to person, place, and time. No distress.  thin  Cardiovascular: Normal rate and regular rhythm.   Murmur heard. Respiratory: Effort normal and breath sounds normal. No respiratory distress. She has no wheezes.  GI: She exhibits distension. There is tenderness. There is guarding.  Tenderness in the LLQ  Musculoskeletal: Normal range of motion.  Neurological: She is alert and oriented to person, place, and time.  Skin: Skin is warm and dry.  Psychiatric: Her behavior is normal. Judgment normal.     Assessment/Plan Large bowel obstruction   I have none of the records [place].  She needs urgent admission to the hospital for eventual colon resection and probable colostomy.  I will order plain abd xrays and a CT scan.  Cardiology and pulmonary may need to be asked to see her preop.  I will have a PICC line placed as well in anticipation of the need for TNA.  I have notified our in hospital surgeon of the admission  Almira Phetteplace A 11/22/2014, 11:26 AM

## 2014-11-22 NOTE — Progress Notes (Signed)
Peripherally Inserted Central Catheter/Midline Placement  The IV Nurse has discussed with the patient and/or persons authorized to consent for the patient, the purpose of this procedure and the potential benefits and risks involved with this procedure.  The benefits include less needle sticks, lab draws from the catheter and patient may be discharged home with the catheter.  Risks include, but not limited to, infection, bleeding, blood clot (thrombus formation), and puncture of an artery; nerve damage and irregular heat beat.  Alternatives to this procedure were also discussed.  PICC/Midline Placement Documentation  PICC / Midline Double Lumen 11/22/14 PICC Right Basilic 37 cm 0 cm (Active)  Indication for Insertion or Continuance of Line Administration of hyperosmolar/irritating solutions (i.e. TPN, Vancomycin, etc.) 11/22/2014  5:51 PM  Exposed Catheter (cm) 0 cm 11/22/2014  5:51 PM  Site Assessment Clean;Dry;Intact 11/22/2014  5:51 PM  Lumen #1 Status Flushed;Saline locked;Blood return noted 11/22/2014  5:51 PM  Lumen #2 Status Flushed;Saline locked;Blood return noted 11/22/2014  5:51 PM  Dressing Type Transparent 11/22/2014  5:51 PM  Dressing Intervention New dressing 11/22/2014  5:51 PM  Dressing Change Due 11/29/14 11/22/2014  5:51 PM       April Hull, April Hull 11/22/2014, 5:53 PM

## 2014-11-22 NOTE — Progress Notes (Signed)
Patient had just a soft, formed and medium stool. Color yellow/brown.

## 2014-11-22 NOTE — Progress Notes (Signed)
Called Pulmonology and Cardiology about pre-operative/peri-operative recommendations given her cardiac and pulmonary problems as requested by Dr. Magnus IvanBlackman.  She may need to proceed with surgery on Monday if not improved or sooner if worsening.  Aris GeorgiaMegan Dort, PA-C [facility] [phone]

## 2014-11-22 NOTE — Consult Note (Signed)
PULMONARY/CCM CONSULT NOTE  Requesting MD/Service: CCS/Blackman Date of admission: 2/05 Date of consult: 2/05 Reason for consultation: pre-op pulm eval  Pt Profile:  87 F with mild COPD followed by VS in office admitted for large bowel obstruction due to a stricture with surgery likely first of next week. PCCM asked to provide pre-operative pulmonary evaluation. Respiratory status is at baseline   HPI:  Admission history has been reviewed and surgeon's plan of care noted. She is followed by Dr Craige Cotta in the [facility] pulmoary office for mild COPD by PFTs 2014 (FEV1 75% pred). She is maintained on PRN albuterol only and rarely uses this medication. She has not had severe exacerbations and does not have daily mucus. She has never had hemoptysis. She is limited in her exertional capacity by fatigue and weakness but does not believe that this is on the basis of dyspnea. She has no other chest or respiratory complaints. She does note inability to eat and significant wt loss over the past several weeks which has been attributed to her LBO.  Past Medical History  Diagnosis Date  . Essential hypertension, benign   . GERD (gastroesophageal reflux disease)   . Aortic stenosis   . Mixed hyperlipidemia   . Colon polyp   . Sigmoid diverticulitis   . Osteoporosis   . COPD (chronic obstructive pulmonary disease)   . Complication of anesthesia     " DIFFICULTY WAKING "  . OA (osteoarthritis of the spine)     MEDICATIONS:   History   Social History  . Marital Status: Married    Spouse Name: N/A    Number of Children: N/A  . Years of Education: N/A   Occupational History  . retired    Social History Main Topics  . Smoking status: Current Every Day Smoker -- 0.25 packs/day for 40 years    Types: Cigarettes    Start date: 08/04/1956  . Smokeless tobacco: Never Used     Comment: down to 2 ciggs per day   . Alcohol Use: No  . Drug Use: No  . Sexual Activity: No   Other Topics Concern  . Not on  file   Social History Narrative    Family History  Problem Relation Age of Onset  . Heart attack Mother   . COPD Father   . CAD Brother   . Emphysema Father   . Breast cancer Sister   . Heart disease Sister   . Uterine cancer Daughter     ROS - as per HPI. Otherwise, noncontributory  Filed Vitals:   11/22/14 1445  BP: 133/79  Pulse: 105  Resp: 18  Height:  (1.6 m)  Weight: 48.081 kg (106 lb)  SpO2: 100%    EXAM:  Gen: WDWN, pleasant, NAD HEENT: WNL Neck: no JVD, no LAN Lungs: mildly diminished BS, no adventitious sounds Cardiovascular: regular III/VI syst M radiating from aortic region to neck Abdomen: soft, no rebound, no guarding Ext: no C/C/E Neuro: grossly intact   DATA:  Prior PFTs from 04/2013 reviewed Prior CXR from 2015 reviewed   IMPRESSION:   Large bowel obstruction Likely laparotomy next week Mild COPD at baseline Smoker of a couple of cigarettes daily  DISCUSSION: She seems very much a suitable candidate for surgery from a pulmonary perspective. Since she is at her baseline, there is likely little to be done to reduce her already low risk of peri operative pulmonary complications  PLAN:  Check CXR Begin scheduled budesonide/arformoterol pre-operatively and continue  at least 24 hrs post operatively I counseled re: smoking cessation  PCCM will see again post operatively to assist in post op pulmonary care. Please call sooner PRN   Billy Fischeravid Soleia Badolato, MD ; [facility] Mobile [phone].  After 5:30 PM or weekends, call [phone]

## 2014-11-22 NOTE — Consult Note (Signed)
Reason for Consult:   Pre Op clearnace  Requesting Physician: CCS  HPI: This is a 74 y.o. female with a past medical history significant for HTN, COPD, GERD, nad moderate AS. She is followed by Dr Diona Browner in [place]. Her last echo was 10/23/14 and showed Nl LVF with LVH, bi cuspid AOV with moderate AS, mean gradient 19 mmHg, and AVA 1.3 cm. She has been asymptomatic from a cardiovascular standpoint. She is admitted now with large bowel obstruction. She has been having symptoms for months. She has lost 26 lbs since Oct 2015. She denies chest pain, syncope, or a change in her chronic DOE.   PMHx:  Past Medical History  Diagnosis Date  . Essential hypertension, benign   . GERD (gastroesophageal reflux disease)   . Aortic stenosis   . Mixed hyperlipidemia   . Colon polyp   . Sigmoid diverticulitis   . Osteoporosis   . COPD (chronic obstructive pulmonary disease)   . Complication of anesthesia     " DIFFICULTY WAKING "  . OA (osteoarthritis of the spine)     Past Surgical History  Procedure Laterality Date  . Left shoulder surgery  2006  . Esophagogastroduodenoscopy  10/15/05  . Vaginal hysterectomy    . Colonoscopy  10/15/05  . Lumbar disc surgery      x 2    SOCHx:  reports that she has been smoking Cigarettes.  She started smoking about 58 years ago. She has a 10 pack-year smoking history. She has never used smokeless tobacco. She reports that she does not drink alcohol or use illicit drugs.  FAMHx: Family History  Problem Relation Age of Onset  . Heart attack Mother   . COPD Father   . CAD Brother   . Emphysema Father   . Breast cancer Sister   . Heart disease Sister   . Uterine cancer Daughter     ALLERGIES: Allergies  Allergen Reactions  . Bactrim [Sulfamethoxazole-Trimethoprim]   . Codeine Palpitations    Dizziness, sweats     ROS: Pertinent items are noted in HPI. see H&P for complete ROS  HOME MEDICATIONS: Prior to Admission medications     Medication Sig Start Date End Date Taking? Authorizing Provider  albuterol (PROAIR HFA) 108 (90 BASE) MCG/ACT inhaler Inhale 2 puffs into the lungs every 6 (six) hours as needed for wheezing or shortness of breath. 02/11/14   Coralyn Helling, MD  aspirin 81 MG tablet Take 81 mg by mouth every other day.     Historical Provider, MD  benzonatate (TESSALON) 100 MG capsule Take 100 mg by mouth 3 (three) times daily as needed for cough.    Historical Provider, MD  calcium carbonate (TUMS EX) 750 MG chewable tablet Chew 1 tablet by mouth daily.    Historical Provider, MD  fluticasone (FLONASE) 50 MCG/ACT nasal spray USE TWO SPRAY(S) IN EACH NOSTRIL ONCE DAILY 09/13/14   Coralyn Helling, MD  levothyroxine (SYNTHROID, LEVOTHROID) 100 MCG tablet Take 100 mcg by mouth daily before breakfast.    Historical Provider, MD  lisinopril (PRINIVIL,ZESTRIL) 10 MG tablet Take 10 mg by mouth daily.    Historical Provider, MD    HOSPITAL MEDICATIONS: I have reviewed the patient's current medications.  VITALS: Blood pressure 133/79, pulse 105, resp. rate 18, height  (1.6 m), weight 106 lb (48.081 kg), SpO2 100 %.  PHYSICAL EXAM: General appearance: alert, cooperative, cachectic and no distress Neck: no carotid bruit and no  JVD Lungs: clear to auscultation bilaterally Heart: regular rate and rhythm and 2/6 systolic murmur AOV, S2 present Abdomen: soft, non-tender; bowel sounds normal; no masses,  no organomegaly Extremities: extremities normal, atraumatic, no cyanosis or edema Pulses: 2+ and symmetric Skin: Skin color, texture, turgor normal. No rashes or lesions Neurologic: Grossly normal  LABS: No results found for this or any previous visit (from the past 24 hour(s)).  EKG:  pending  IMAGING: No results found.  IMPRESSION: Principal Problem:   Large bowel obstruction Active Problems:   Aortic stenosis-moderate   Essential hypertension, benign   COPD (chronic obstructive pulmonary disease)    Pre-operative cardiovascular examination   Tobacco abuse   Upper airway cough syndrome   GERD (gastroesophageal reflux disease)   RECOMMENDATION: MD to see. Check baseline EKG.  She appears to be an acceptable cardiovascular risk for surgery. We will be available perioperatively. Avoid significant hypotension.   Time Spent Directly with Patient: 45 minutes  Abelino DerrickKILROY,LUKE K [phone] beeper 11/22/2014, 4:07 PM  Patient seen and examined and history reviewed. Agree with above findings and plan. Pleasant 74 yo WF with bowel obstruction. She has a history of moderate Aortic stenosis. She has been asymptomatic without exertional chest pain or dizziness. Ecg is pending. Exam is consistent with mild to moderate AS. She is at fairly low risk from a cardiac standpoint and can proceed with planned surgery.   Czarina Gingras [place], MDFACC 11/22/2014 4:24 PM

## 2014-11-22 NOTE — H&P (Signed)
Abigail Miyamoto, MD Physician Signed  H&P 11/22/2014 11:26 AM  Related encounter: Orders Only from 11/22/2014 in [facility]    Expand All Collapse All   April Hull is an 74 y.o. female.  Chief Complaint: large bowel obstruction HPI: this is a 74 year old female that was referred from [place], [place] secondary to a colonic stricture. She is accompanied by her daughter. Apparently, she started having bouts of diverticulitis in October 2015. She has been on Antibiotics since thenand is even had admission to the hospital at [facility]. 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 Currently, she has not had a bowel movement in several weeks. She is only keeping down Enure. She reports left lower quadrant abdominal pain.  Past Medical History  Diagnosis Date  . Essential hypertension, benign   . GERD (gastroesophageal reflux disease)   . Aortic stenosis   . Mixed hyperlipidemia   . Colon polyp   . Sigmoid diverticulitis   . Osteoporosis   . COPD (chronic obstructive pulmonary disease)     Past Surgical History  Procedure Laterality Date  . Left shoulder surgery  2006  . Esophagogastroduodenoscopy  10/15/05  . Vaginal hysterectomy    . Colonoscopy  10/15/05  . Lumbar disc surgery      x 2    Family History  Problem Relation Age of Onset  . Heart attack Mother   . COPD Father   . CAD Brother   . Emphysema Father   . Breast cancer Sister   . Heart disease Sister   . Uterine cancer Daughter    Social History:  reports that she has been smoking Cigarettes. She started smoking about 58 years ago. She has a 10 pack-year smoking history. She has never used smokeless tobacco. She reports that she does not drink alcohol or use illicit drugs.  Allergies:   Allergies  Allergen Reactions  . Bactrim [Sulfamethoxazole-Trimethoprim]   . Codeine Palpitations    Dizziness, sweats      (Not in a hospital admission)   Lab Results Last 48 Hours    No results found for this or any previous visit (from the past 48 hour(s)).    Imaging Results (Last 48 hours)    No results found.    Review of Systems  Respiratory: Positive for cough and shortness of breath.  Gastrointestinal: Positive for nausea, vomiting, abdominal pain and constipation.    There were no vitals taken for this visit. Physical Exam  Constitutional: She is oriented to person, place, and time. No distress.  thin  Cardiovascular: Normal rate and regular rhythm.  Murmur heard. Respiratory: Effort normal and breath sounds normal. No respiratory distress. She has no wheezes.  GI: She exhibits distension. There is tenderness. There is guarding.  Tenderness in the LLQ  Musculoskeletal: Normal range of motion.  Neurological: She is alert and oriented to person, place, and time.  Skin: Skin is warm and dry.  Psychiatric: Her behavior is normal. Judgment normal.     Assessment/Plan Large bowel obstruction  I have none of the records from [place], [place]. She needs urgent admission to the hospital for eventual colon resection and probable colostomy. I will order plain abd xrays and a CT scan. Cardiology and pulmonary may need to be asked to see her preop. I will have a PICC line placed as well in anticipation of  the need for TNA. I have notified our in hospital surgeon of the admission  [facility]BLACK[facility],Ottilie Wigglesworth A 11/22/2014, 11:26 AM         Routing History     Date/Time From To Method   11/22/2014 11:33 AM Abigail Miyamotoouglas Anniyah Mood, MD Abigail Miyamotoouglas Joellen Tullos, MD [facility]   11/22/2014 11:33 AM Abigail Miyamotoouglas Ledarrius Beauchaine, MD Ignatius Speckinghruv B. Vyas, MD Fax

## 2014-11-23 ENCOUNTER — Encounter (HOSPITAL_COMMUNITY): Payer: Self-pay | Admitting: Radiology

## 2014-11-23 ENCOUNTER — Inpatient Hospital Stay (HOSPITAL_COMMUNITY): Payer: Medicare Other

## 2014-11-23 DIAGNOSIS — K5780 Diverticulitis of intestine, part unspecified, with perforation and abscess without bleeding: Secondary | ICD-10-CM | POA: Insufficient documentation

## 2014-11-23 LAB — PROTIME-INR
INR: 1.07 (ref 0.00–1.49)
Prothrombin Time: 14 seconds (ref 11.6–15.2)

## 2014-11-23 MED ORDER — FENTANYL CITRATE 0.05 MG/ML IJ SOLN
INTRAMUSCULAR | Status: AC
  Filled 2014-11-23: qty 2

## 2014-11-23 MED ORDER — LIDOCAINE HCL 1 % IJ SOLN
INTRAMUSCULAR | Status: AC
  Filled 2014-11-23: qty 20

## 2014-11-23 MED ORDER — MIDAZOLAM HCL 2 MG/2ML IJ SOLN
INTRAMUSCULAR | Status: AC | PRN
  Administered 2014-11-23: 1 mg via INTRAVENOUS

## 2014-11-23 MED ORDER — LIDOCAINE-EPINEPHRINE 1 %-1:100000 IJ SOLN
INTRAMUSCULAR | Status: AC
  Filled 2014-11-23: qty 1

## 2014-11-23 MED ORDER — MIDAZOLAM HCL 2 MG/2ML IJ SOLN
INTRAMUSCULAR | Status: AC
  Filled 2014-11-23: qty 2

## 2014-11-23 MED ORDER — FENTANYL CITRATE 0.05 MG/ML IJ SOLN
INTRAMUSCULAR | Status: AC | PRN
  Administered 2014-11-23 (×2): 25 ug via INTRAVENOUS

## 2014-11-23 MED ORDER — CETYLPYRIDINIUM CHLORIDE 0.05 % MT LIQD
7.0000 mL | Freq: Two times a day (BID) | OROMUCOSAL | Status: DC
  Administered 2014-11-23 – 2014-12-05 (×18): 7 mL via OROMUCOSAL

## 2014-11-23 MED ORDER — CHLORHEXIDINE GLUCONATE 0.12 % MT SOLN
15.0000 mL | Freq: Two times a day (BID) | OROMUCOSAL | Status: DC
  Administered 2014-11-23 – 2014-12-09 (×28): 15 mL via OROMUCOSAL
  Filled 2014-11-23 (×22): qty 15

## 2014-11-23 NOTE — Sedation Documentation (Signed)
Patient denies pain and is resting comfortably.  

## 2014-11-23 NOTE — Progress Notes (Signed)
INITIAL NUTRITION ASSESSMENT  Pt meets criteria for SEVERE MALNUTRITION in the context of acute illness or injury as evidenced by a 11.6% weight loss in 1 month and severe fat and muscle mass loss.  DOCUMENTATION CODES Per approved criteria  -Severe malnutrition in the context of acute illness or injury   INTERVENTION: Diet advancement per MD.  Once diet is advanced: Monitor magnesium, potassium, and phosphorus daily for at least 3 days, MD to replete as needed, as pt is at risk for refeeding syndrome given extremely poor po intake > 1 week and severe malnutrition.  Will continue to monitor.  NUTRITION DIAGNOSIS: Inadequate oral intake related to large bowel obstruction, inability to eat as evidenced by pt report.   Goal: Pt to meet >/= 90% of their estimated nutrition needs   Monitor:  Diet advancement, weight trends, labs, I/O's  Reason for Assessment: MST  74 y.o. female  Admitting Dx: Large bowel obstruction  ASSESSMENT: Pt with c/o constipation and unable to have a BM for several weeks and recent colonoscopy revealed stricture of sigmoid colon. She was admitted to [facility] and CT revealed periocolonic abscess, the patient does admits to history of diverticulitis.  Pt reports her appetite is "ok", however says she has been unable to eat over the past 3 months as she reports "foods would not stay down". Pt reports having weight loss with usual body weight of 133 lbs where she reports last weighing in October 2015. Noted pt with a 11.6% weight loss in 1 month. Pt with extreme poor po intake over the past several week especially. Pt is at risk for refeeding syndrome. Pt reports she does like to drink Ensure when she is able to "keep it down". RD to order Ensure once diet advances. PT pt unable to tolerate PO, may need to consider TPN.  Nutrition Focused Physical Exam:  Subcutaneous Fat:  Orbital Region: N/A Upper Arm Region: Severe depletion Thoracic and Lumbar Region: Moderate  depletion  Muscle:  Temple Region: N/A Clavicle Bone Region: Severe depletion Clavicle and Acromion Bone Region: Severe depletion Scapular Bone Region: N/A Dorsal Hand: N/A Patellar Region: Moderate depletion Anterior Thigh Region: Severe depletion Posterior Calf Region: Moderate depletion  Edema: none  Labs and medications reviewed.  Height: Ht Readings from Last 1 Encounters:  11/22/14  (1.6 m)    Weight: Wt Readings from Last 1 Encounters:  11/22/14 106 lb (48.081 kg)    Ideal Body Weight: 115 lbs  % Ideal Body Weight: 92%  Wt Readings from Last 10 Encounters:  11/22/14 106 lb (48.081 kg)  10/21/14 120 lb (54.432 kg)  08/16/14 132 lb (59.875 kg)  05/02/14 132 lb 6.4 oz (60.056 kg)  04/08/14 132 lb 9.6 oz (60.147 kg)  02/11/14 133 lb 12.8 oz (60.691 kg)  10/26/13 124 lb (56.246 kg)  04/23/13 133 lb (60.328 kg)  02/02/13 132 lb 12.8 oz (60.238 kg)    Usual Body Weight: 133 lbs  % Usual Body Weight: 79%  BMI:  Body mass index is 18.78 kg/(m^2).  Estimated Nutritional Needs: Kcal: 1610-9604 Protein: 75-90 grams Fluid: 1.7 - 1.9 L/day  Skin: intact  Diet Order: Diet NPO time specified Except for: Ice Chips Diet NPO time specified Except for: Sips with Meds  EDUCATION NEEDS: -No education needs identified at this time   Intake/Output Summary (Last 24 hours) at 11/23/14 1419 Last data filed at 11/23/14 0704  Gross per 24 hour  Intake 1612.5 ml  Output    800 ml  Net  812.5 ml    Last BM: 2/5  Labs:   Recent Labs Lab 11/22/14 1540  NA 132*  K 4.0  CL 96  CO2 27  BUN 11  CREATININE 0.53  CALCIUM 9.5  GLUCOSE 94    CBG (last 3)  No results for input(s): GLUCAP in the last 72 hours.  Scheduled Meds: . antiseptic oral rinse  7 mL Mouth Rinse q12n4p  . arformoterol  15 mcg Nebulization BID  . budesonide  0.25 mg Nebulization BID  . chlorhexidine  15 mL Mouth Rinse BID  . ciprofloxacin  400 mg Intravenous Q12H  . fentaNYL       . lidocaine-EPINEPHrine      . metronidazole  500 mg Intravenous Q8H  . midazolam      . pantoprazole (PROTONIX) IV  40 mg Intravenous QHS  . sodium chloride  10-40 mL Intracatheter Q12H    Continuous Infusions: . 0.9 % NaCl with KCl 20 mEq / L 75 mL/hr at 11/23/14 1323    Past Medical History  Diagnosis Date  . Essential hypertension, benign   . GERD (gastroesophageal reflux disease)   . Aortic stenosis   . Mixed hyperlipidemia   . Colon polyp   . Sigmoid diverticulitis   . Osteoporosis   . COPD (chronic obstructive pulmonary disease)   . Complication of anesthesia     " DIFFICULTY WAKING "  . OA (osteoarthritis of the spine)     Past Surgical History  Procedure Laterality Date  . Left shoulder surgery  2006  . Esophagogastroduodenoscopy  10/15/05  . Vaginal hysterectomy    . Colonoscopy  10/15/05  . Lumbar disc surgery      x 2    Marijean NiemannStephanie La, MS, RD, LDN Pager # [phone] After hours/ weekend pager # [phone]

## 2014-11-23 NOTE — Progress Notes (Addendum)
Subjective: She is improving.  Afebrile. Heart rate 80. Lab work last night showed white count 8900. Creatinine 0.53. Potassium 4.0. Hemoglobin 11.8.   she had one bowel movement, reasonably formed, and flatus. She denies pain did have some vomiting after drinking the CT contrast yesterday.  I have reviewed her x-rays from [place] and from here.   BE does show a tight stricture of the mid sigmoid and isignificant dilatation of the colon. CT scan shows the left lower quadrant abscess, at least 6 cm, but: Is much less distended.  Appreciate Dr. Elvis CoilJordan's cardiac consultation regarding mild to moderate aortic stenosis. Apparently low risk from a cardiac standpoint. Appreciate Dr. Westley HummerSimond's consultation regarding tobacco abuse and mild COPD. To start pulmonary therapies. She's felt to be low risk for pulmonary complications    Objective: Vital signs in last 24 hours: Temp:  [98.5 F (36.9 C)] 98.5 F (36.9 C) (02/06 0459) Pulse Rate:  [80-105] 80 (02/06 0459) Resp:  [18-19] 18 (02/06 0459) BP: (105-133)/(54-79) 105/54 mmHg (02/06 0459) SpO2:  [95 %-100 %] 95 % (02/06 0459) Weight:  [106 lb (48.081 kg)] 106 lb (48.081 kg) (02/05 1445) Last BM Date: 11/22/14  Intake/Output from previous day: 02/05 0701 - 02/06 0700 In: 1612.5 [I.V.:1012.5; IV Piggyback:600] Out: 450 [Urine:450] Intake/Output this shift: Total I/O In: -  Out: 350 [Urine:350]  General appearance: Alert. Pleasant. Oriented. Cooperative. No distress. Resp: clear to auscultation bilaterally GI: Soft. Not obviously distended. No significant tenderness or guarding.  Lab Results:   Recent Labs  11/22/14 1540  WBC 8.9  HGB 11.8*  HCT 36.8  PLT 403*   BMET  Recent Labs  11/22/14 1540  NA 132*  K 4.0  CL 96  CO2 27  GLUCOSE 94  BUN 11  CREATININE 0.53  CALCIUM 9.5   PT/INR No results for input(s): LABPROT, INR in the last 72 hours. ABG No results for input(s): PHART, HCO3 in the last 72  hours.  Invalid input(s): PCO2, PO2  Studies/Results: Dg Abd 1 View  11/22/2014   CLINICAL DATA:  Abdominal pain and distention  EXAM: ABDOMEN - 1 VIEW  COMPARISON:  11/15/2014 barium enema  FINDINGS: Moderate stool burden. No dilated gas-filled loop of bowel is identified. Presence or absence of air-fluid levels or free air is suboptimally evaluated on this supine projection. Splenic arterial calcification reidentified over the left upper quadrant. L4-L5 disc degenerative change noted.  IMPRESSION: Normal bowel gas pattern.  Moderate stool burden.   Electronically Signed   By: Christiana PellantGretchen  Green M.D.   On: 11/22/2014 19:03   Ct Abdomen Pelvis W Contrast  11/23/2014   CLINICAL DATA:  Left lower quadrant abdominal pain. Recent colonoscopy.  EXAM: CT ABDOMEN AND PELVIS WITH CONTRAST  TECHNIQUE: Multidetector CT imaging of the abdomen and pelvis was performed using the standard protocol following bolus administration of intravenous contrast. Enteric and rectal contrast administered per request.  CONTRAST:  100mL OMNIPAQUE IOHEXOL 300 MG/ML  SOLN  COMPARISON:  CT 09/06/2014  FINDINGS: Motion artifact through the lung bases. Small calcification at the left costophrenic angle, unchanged.  There is colonic wall thickening in the sigmoid colon spanning greater than 10 cm with surrounding inflammatory change. There is an extraluminal pericolonic fluid collection measuring 5.6 x 6.0 x 5.4 cm in the left lower quadrant with multiple foci of internal air. Multiple diverticular seen in this region, this may reflect a or peri diverticular abscess. This is at site of previous acute diverticulitis. There is a large volume of  stool throughout the remainder of the colon. No small bowel dilatation. There is no free air in the upper abdomen.  No focal hepatic lesion. Gallbladder is physiologically distended. Pancreas and spleen are normal. Mild left adrenal thickening without discrete nodule. Kidneys demonstrate symmetric  enhancement and excretion without hydronephrosis. Subcentimeter cyst in the left kidney.  Atherosclerosis of the abdominal aorta which flares to 2.2 cm minimally distally but no aneurysmal dilatation. There is no retroperitoneal adenopathy.  No intra-abdominal ascites.  The bladder is decompressed, suggestion of bladder wall thickening. There is no intravesicular air. The uterus is surgically absent.  Small sclerotic focus in L5, likely bone island. There is degenerative change in the lumbar spine.  IMPRESSION: 1. Diffuse colonic wall thickening involving the sigmoid colon, spanning at least 10 cm. There is a pericolonic abscess measuring up to 6 cm. This may reflect a diverticular abscess, however neoplasm is not entirely excluded. 2. Large volume of stool throughout the entire colon.  3. Suggestion of bladder wall thickening, likely reactive. There is no intravesicular air to suggest colovesicular fistula. These results were called by telephone at the time of interpretation on 11/23/2014 at 3:38 am to Dr. Andrey Campanile, who verbally acknowledged these results.   Electronically Signed   By: Rubye Oaks M.D.   On: 11/23/2014 03:39    Anti-infectives: Anti-infectives    Start     Dose/Rate Route Frequency Ordered Stop   11/22/14 1500  ciprofloxacin (CIPRO) IVPB 400 mg     400 mg200 mL/hr over 60 Minutes Intravenous Every 12 hours 11/22/14 1447     11/22/14 1500  metroNIDAZOLE (FLAGYL) IVPB 500 mg     500 mg100 mL/hr over 60 Minutes Intravenous Every 8 hours 11/22/14 1447        Assessment/Plan:  Sigmoid diverticulitis with stricture, pericolonic abscess, and partial colonic obstruction area neck sign symptomatically the obstruction is improving. Radiographically appears to be much less as well.  Since large bowel obstruction is radiographically and  symptomatically improving, will proceed with IR guided drainage of pericolonic abscess. Next line continue antibiotics  NPO except ice chips Restart  Lovenox tomorrow if drainage procedure goes well Timing of surgical intervention will depend on response to these therapies.  Consider TNA.  Aortic stenosis Tobacco abuse and mild COPD.     LOS: 1 day    April Hull M 11/23/2014

## 2014-11-23 NOTE — Sedation Documentation (Signed)
Patient is resting comfortably. 

## 2014-11-23 NOTE — Sedation Documentation (Addendum)
Dr. Grace IsaacWatts injecting lidocaine, pt tolerating well. Opening eyes occasionally.

## 2014-11-23 NOTE — Sedation Documentation (Signed)
No CO2 waveform despite repositioning. Pt in supine position for this procedure.

## 2014-11-23 NOTE — H&P (Signed)
Reason for Consult: Pericolonic abscess  Referring Physician(s): CCS  History of Present Illness: April BatheShelby J Hull is a 74 y.o. female who was seen in the office by CCS on 2/5 with c/o constipation and unable to have a BM for several weeks and recent colonoscopy revealed stricture of sigmoid colon. [facility] revealed periocolonic abscess, the patient does admits to history of diverticulitis. Today she does c/o LLQ pain with palpation. She does admit to (+) flatus today and 1 BM last evening. CCS has requested IR consult for pericolonic abscess management. She denies any chest pain, shortness of breath or palpitations. She denies any active signs of bleeding or excessive bruising. The patient denies any history of sleep apnea or chronic oxygen use. She has previously tolerated sedation without complications.    Past Medical History  Diagnosis Date  . Essential hypertension, benign   . GERD (gastroesophageal reflux disease)   . Aortic stenosis   . Mixed hyperlipidemia   . Colon polyp   . Sigmoid diverticulitis   . Osteoporosis   . COPD (chronic obstructive pulmonary disease)   . Complication of anesthesia     " DIFFICULTY WAKING "  . OA (osteoarthritis of the spine)     Past Surgical History  Procedure Laterality Date  . Left shoulder surgery  2006  . Esophagogastroduodenoscopy  10/15/05  . Vaginal hysterectomy    . Colonoscopy  10/15/05  . Lumbar disc surgery      x 2    Allergies: Bactrim and Codeine  Medications: Prior to Admission medications   Medication Sig Start Date End Date Taking? Authorizing Provider  albuterol (PROAIR HFA) 108 (90 BASE) MCG/ACT inhaler Inhale 2 puffs into the lungs every 6 (six) hours as needed for wheezing or shortness of breath. 02/11/14  Yes Coralyn HellingVineet Sood, MD  aspirin 81 MG tablet Take 81 mg by mouth every other day.    Yes Historical Provider, MD  fluticasone (FLONASE) 50 MCG/ACT nasal spray USE TWO SPRAY(S) IN EACH  NOSTRIL ONCE DAILY 09/13/14  Yes Coralyn HellingVineet Sood, MD  levothyroxine (SYNTHROID, LEVOTHROID) 100 MCG tablet Take 100 mcg by mouth daily before breakfast.   Yes Historical Provider, MD  lisinopril (PRINIVIL,ZESTRIL) 10 MG tablet Take 10 mg by mouth daily.   Yes Historical Provider, MD  pantoprazole (PROTONIX) 40 MG tablet Take 40 mg by mouth daily.   Yes Historical Provider, MD    Family History  Problem Relation Age of Onset  . Heart attack Mother   . COPD Father   . CAD Brother   . Emphysema Father   . Breast cancer Sister   . Heart disease Sister   . Uterine cancer Daughter     History   Social History  . Marital Status: Married    Spouse Name: N/A    Number of Children: N/A  . Years of Education: N/A   Occupational History  . retired    Social History Main Topics  . Smoking status: Current Every Day Smoker -- 0.25 packs/day for 40 years    Types: Cigarettes    Start date: 08/04/1956  . Smokeless tobacco: Never Used     Comment: down to 2 ciggs per day   . Alcohol Use: No  . Drug Use: No  . Sexual Activity: No   Other Topics Concern  . None   Social History Narrative   Review of Systems: A 12 point ROS discussed and pertinent positives are indicated in the  HPI above.  All other systems are negative.  Review of Systems  Vital Signs: BP 105/54 mmHg  Pulse 80  Temp(Src) 98.5 F (36.9 C) (Oral)  Resp 18  Ht  (1.6 m)  Wt 106 lb (48.081 kg)  BMI 18.78 kg/m2  SpO2 95%  Physical Exam  Constitutional: She is oriented to person, place, and time. No distress.  HENT:  Head: Normocephalic and atraumatic.  Neck: No tracheal deviation present.  Cardiovascular: Normal rate and regular rhythm.  Exam reveals no gallop and no friction rub.   No murmur heard. Pulmonary/Chest: Effort normal and breath sounds normal. No respiratory distress. She has no wheezes. She has no rales.  Abdominal: Soft. Bowel sounds are normal. There is tenderness.  LLQ TTP  Neurological: She  is alert and oriented to person, place, and time.  Skin: Skin is warm and dry. She is not diaphoretic.  Psychiatric: She has a normal mood and affect. Her behavior is normal. Thought content normal.    Imaging: Dg Abd 1 View  11/22/2014   CLINICAL DATA:  Abdominal pain and distention  EXAM: ABDOMEN - 1 VIEW  COMPARISON:  11/15/2014 barium enema  FINDINGS: Moderate stool burden. No dilated gas-filled loop of bowel is identified. Presence or absence of air-fluid levels or free air is suboptimally evaluated on this supine projection. Splenic arterial calcification reidentified over the left upper quadrant. L4-L5 disc degenerative change noted.  IMPRESSION: Normal bowel gas pattern.  Moderate stool burden.   Electronically Signed   By: Christiana Pellant M.D.   On: 11/22/2014 19:03   Ct Abdomen Pelvis W Contrast  11/23/2014   CLINICAL DATA:  Left lower quadrant abdominal pain. Recent colonoscopy.  EXAM: CT ABDOMEN AND PELVIS WITH CONTRAST  TECHNIQUE: Multidetector CT imaging of the abdomen and pelvis was performed using the standard protocol following bolus administration of intravenous contrast. Enteric and rectal contrast administered per request.  CONTRAST:  OMNIPAQUE IOHEXOL 300 MG/ML  SOLN  COMPARISON:  CT 09/06/2014  FINDINGS: Motion artifact through the lung bases. Small calcification at the left costophrenic angle, unchanged.  There is colonic wall thickening in the sigmoid colon spanning greater than 10 cm with surrounding inflammatory change. There is an extraluminal pericolonic fluid collection measuring 5.6 x 6.0 x 5.4 cm in the left lower quadrant with multiple foci of internal air. Multiple diverticular seen in this region, this may reflect a or peri diverticular abscess. This is at site of previous acute diverticulitis. There is a large volume of stool throughout the remainder of the colon. No small bowel dilatation. There is no free air in the upper abdomen.  No focal hepatic lesion.  Gallbladder is physiologically distended. Pancreas and spleen are normal. Mild left adrenal thickening without discrete nodule. Kidneys demonstrate symmetric enhancement and excretion without hydronephrosis. Subcentimeter cyst in the left kidney.  Atherosclerosis of the abdominal aorta which flares to 2.2 cm minimally distally but no aneurysmal dilatation. There is no retroperitoneal adenopathy.  No intra-abdominal ascites.  The bladder is decompressed, suggestion of bladder wall thickening. There is no intravesicular air. The uterus is surgically absent.  Small sclerotic focus in L5, likely bone island. There is degenerative change in the lumbar spine.  IMPRESSION: 1. Diffuse colonic wall thickening involving the sigmoid colon, spanning at least 10 cm. There is a pericolonic abscess measuring up to 6 cm. This may reflect a diverticular abscess, however neoplasm is not entirely excluded. 2. Large volume of stool throughout the entire colon.  3. Suggestion  of bladder wall thickening, likely reactive. There is no intravesicular air to suggest colovesicular fistula. These results were called by telephone at the time of interpretation on 11/23/2014 at 3:38 am to Dr. Andrey Campanile, who verbally acknowledged these results.   Electronically Signed   By: Rubye Oaks M.D.   On: 11/23/2014 03:39    Labs:  CBC:  Recent Labs  11/22/14 1540  WBC 8.9  HGB 11.8*  HCT 36.8  PLT 403*    COAGS: No results for input(s): INR, APTT in the last 8760 hours.  BMP:  Recent Labs  11/22/14 1540  NA 132*  K 4.0  CL 96  CO2 27  GLUCOSE 94  BUN 11  CALCIUM 9.5  CREATININE 0.53  GFRNONAA >90  GFRAA >90    LIVER FUNCTION TESTS:  Recent Labs  11/22/14 1540  BILITOT 0.5  AST 20  ALT 11  ALKPHOS 88  PROT 6.6  ALBUMIN 2.9*   Assessment and Plan: Sigmoid diverticulitis with stricture, pericolonic abscess Partial colonic obstruction Scheduled for CT guided left transgluteal percutaneous drain placement  today with moderate sedation for abscess Patient has been NPO, sq lovenox  given yesterday 1623 per Dr. Grace Isaac ok to proceed with procedure later today, labs reviewed INR ordered Risks and Benefits discussed with the patient. All of the patient's questions were answered, patient is agreeable to proceed. Consent signed and in chart. Mild to moderate AS Mild COPD.   Thank you for this interesting consult.  I greatly enjoyed meeting April Hull and look forward to participating in their care.  SignedBerneta Levins 11/23/2014, 10:16 AM   I spent a total of 40 minutes face to face in clinical consultation, greater than 50% of which was counseling/coordinating care

## 2014-11-23 NOTE — Procedures (Signed)
Technically successful CT guided drainage catheter placement into left hemipelvis yielding 10 cc of serosanguinous material.  Samples sent to laboratory.  No immediate post procedural complications.

## 2014-11-23 NOTE — Progress Notes (Signed)
IR PA aware of request for pericolonic abscess drainage. The patient did receive sq lovenox 30mg  last evening at 1623, our guidelines require holding a full dose so we will proceed 2/7 in the am. IR PA to see patient 2/5 to consent.  Pattricia BossKoreen Zakyia Gagan PA-C Interventional Radiology  11/23/14  9:04 AM

## 2014-11-23 NOTE — Sedation Documentation (Signed)
Dr. Grace IsaacWatts s/w patient about procedure. Questions answered.

## 2014-11-24 LAB — BASIC METABOLIC PANEL
ANION GAP: 6 (ref 5–15)
BUN: <5 mg/dL — ABNORMAL LOW (ref 6–23)
CO2: 27 mmol/L (ref 19–32)
Calcium: 8.2 mg/dL — ABNORMAL LOW (ref 8.4–10.5)
Chloride: 100 mmol/L (ref 96–112)
Creatinine, Ser: 0.51 mg/dL (ref 0.50–1.10)
GFR calc non Af Amer: >90 mL/min (ref >90)
Glucose, Bld: 110 mg/dL — ABNORMAL HIGH (ref 70–99)
Potassium: 4 mmol/L (ref 3.5–5.1)
SODIUM: 133 mmol/L — AB (ref 135–145)

## 2014-11-24 LAB — CBC
HEMATOCRIT: 30.8 % — AB (ref 36.0–46.0)
Hemoglobin: 9.9 g/dL — ABNORMAL LOW (ref 12.0–15.0)
MCH: 28.5 pg (ref 26.0–34.0)
MCHC: 32.1 g/dL (ref 30.0–36.0)
MCV: 88.8 fL (ref 78.0–100.0)
Platelets: 327 10*3/uL (ref 150–400)
RBC: 3.47 MIL/uL — ABNORMAL LOW (ref 3.87–5.11)
RDW: 14.1 % (ref 11.5–15.5)
WBC: 8.9 10*3/uL (ref 4.0–10.5)

## 2014-11-24 LAB — GLUCOSE, CAPILLARY
GLUCOSE-CAPILLARY: 81 mg/dL (ref 70–99)
GLUCOSE-CAPILLARY: 88 mg/dL (ref 70–99)

## 2014-11-24 LAB — PHOSPHORUS: Phosphorus: 2.9 mg/dL (ref 2.3–4.6)

## 2014-11-24 LAB — MAGNESIUM: MAGNESIUM: 2 mg/dL (ref 1.5–2.5)

## 2014-11-24 MED ORDER — FAT EMULSION 20 % IV EMUL
240.0000 mL | INTRAVENOUS | Status: AC
  Administered 2014-11-24: 240 mL via INTRAVENOUS
  Filled 2014-11-24: qty 250

## 2014-11-24 MED ORDER — INSULIN ASPART 100 UNIT/ML ~~LOC~~ SOLN
0.0000 [IU] | Freq: Four times a day (QID) | SUBCUTANEOUS | Status: DC
  Administered 2014-11-25 (×2): 1 [IU] via SUBCUTANEOUS
  Administered 2014-11-27: 3 [IU] via SUBCUTANEOUS
  Administered 2014-11-28: 1 [IU] via SUBCUTANEOUS
  Administered 2014-11-28: 2 [IU] via SUBCUTANEOUS
  Administered 2014-11-28 – 2014-11-29 (×5): 1 [IU] via SUBCUTANEOUS

## 2014-11-24 MED ORDER — ENOXAPARIN SODIUM 40 MG/0.4ML ~~LOC~~ SOLN
40.0000 mg | SUBCUTANEOUS | Status: DC
  Administered 2014-11-24 – 2014-11-25 (×2): 40 mg via SUBCUTANEOUS
  Filled 2014-11-24 (×3): qty 0.4

## 2014-11-24 MED ORDER — TRACE MINERALS CR-CU-F-FE-I-MN-MO-SE-ZN IV SOLN
INTRAVENOUS | Status: AC
  Administered 2014-11-24: 18:00:00 via INTRAVENOUS
  Filled 2014-11-24: qty 840

## 2014-11-24 MED ORDER — SODIUM CHLORIDE 0.9 % IV SOLN
INTRAVENOUS | Status: AC
  Administered 2014-11-24: 18:00:00 via INTRAVENOUS

## 2014-11-24 NOTE — Progress Notes (Signed)
Subjective: Stable and alert. Pain is much better than it was on admission. Still has a little bit of lower abdominal pain. No stool or flatus yesterday, but did have stool and flatus the day before.  she says she would like to drink liquids. No nausea  Pericolonic abscess drained by in the interventional radiology yesterday. Serosanguineous fluid retrieved. Cultures pending. Remains on Cipro and Flagyl.  Has been evaluated by Dr. [place] from cardiology and Dr. Sung Amabile from pulmonary medicine. She is felt to be low risk for surgical intervention from cardiac and pulmonary standpoint.   Vital signs in last 24 hours: Temp:  [97.8 F (36.6 C)-98.4 F (36.9 C)] 97.8 F (36.6 C) (02/07 0500) Pulse Rate:  [78-97] 85 (02/07 0500) Resp:  [16-25] 16 (02/07 0500) BP: (98-140)/(42-66) 134/61 mmHg (02/07 0500) SpO2:  [92 %-98 %] 93 % (02/07 0500) Weight:  [127 lb 13.9 oz (58 kg)] 127 lb 13.9 oz (58 kg) (02/07 0500) Last BM Date: 11/22/14  Intake/Output from previous day: 02/06 0701 - 02/07 0700 In: 1265 [I.V.:1255] Out: 1270 [Urine:1250; Drains:20] Intake/Output this shift: Total I/O In: 1250 [I.V.:1245; Other:5] Out: 910 [Urine:900; Drains:10]  General appearance: Patient is in no distress. Looks well. Cooperative and pleasant. No distress Resp: clear to auscultation bilaterally GI: Soft. Somewhat tender in lower abdomen. No peritoneal signs. No mass. Active bowel sounds. Not distended. Drainage tube and left gluteal area. Serosanguineous.  Lab Results:   Recent Labs  11/22/14 1540  WBC 8.9  HGB 11.8*  HCT 36.8  PLT 403*   BMET  Recent Labs  11/22/14 1540 11/24/14 0500  NA 132* 133*  K 4.0 4.0  CL 96 100  CO2 27 27  GLUCOSE 94 110*  BUN 11 <5*  CREATININE 0.53 0.51  CALCIUM 9.5 8.2*   PT/INR  Recent Labs  11/23/14 1148  LABPROT 14.0  INR 1.07   ABG No results for input(s): PHART, HCO3 in the last 72 hours.  Invalid input(s): PCO2,  PO2  Studies/Results: Dg Abd 1 View  11/22/2014   CLINICAL DATA:  Abdominal pain and distention  EXAM: ABDOMEN - 1 VIEW  COMPARISON:  11/15/2014 barium enema  FINDINGS: Moderate stool burden. No dilated gas-filled loop of bowel is identified. Presence or absence of air-fluid levels or free air is suboptimally evaluated on this supine projection. Splenic arterial calcification reidentified over the left upper quadrant. L4-L5 disc degenerative change noted.  IMPRESSION: Normal bowel gas pattern.  Moderate stool burden.   Electronically Signed   By: Christiana Pellant M.D.   On: 11/22/2014 19:03   Ct Abdomen Pelvis W Contrast  11/23/2014   CLINICAL DATA:  Left lower quadrant abdominal pain. Recent colonoscopy.  EXAM: CT ABDOMEN AND PELVIS WITH CONTRAST  TECHNIQUE: Multidetector CT imaging of the abdomen and pelvis was performed using the standard protocol following bolus administration of intravenous contrast. Enteric and rectal contrast administered per request.  CONTRAST:  OMNIPAQUE IOHEXOL 300 MG/ML  SOLN  COMPARISON:  CT 09/06/2014  FINDINGS: Motion artifact through the lung bases. Small calcification at the left costophrenic angle, unchanged.  There is colonic wall thickening in the sigmoid colon spanning greater than 10 cm with surrounding inflammatory change. There is an extraluminal pericolonic fluid collection measuring 5.6 x 6.0 x 5.4 cm in the left lower quadrant with multiple foci of internal air. Multiple diverticular seen in this region, this may reflect a or peri diverticular abscess. This is at site of previous acute diverticulitis. There is a large volume  of stool throughout the remainder of the colon. No small bowel dilatation. There is no free air in the upper abdomen.  No focal hepatic lesion. Gallbladder is physiologically distended. Pancreas and spleen are normal. Mild left adrenal thickening without discrete nodule. Kidneys demonstrate symmetric enhancement and excretion without  hydronephrosis. Subcentimeter cyst in the left kidney.  Atherosclerosis of the abdominal aorta which flares to 2.2 cm minimally distally but no aneurysmal dilatation. There is no retroperitoneal adenopathy.  No intra-abdominal ascites.  The bladder is decompressed, suggestion of bladder wall thickening. There is no intravesicular air. The uterus is surgically absent.  Small sclerotic focus in L5, likely bone island. There is degenerative change in the lumbar spine.  IMPRESSION: 1. Diffuse colonic wall thickening involving the sigmoid colon, spanning at least 10 cm. There is a pericolonic abscess measuring up to 6 cm. This may reflect a diverticular abscess, however neoplasm is not entirely excluded. 2. Large volume of stool throughout the entire colon.  3. Suggestion of bladder wall thickening, likely reactive. There is no intravesicular air to suggest colovesicular fistula. These results were called by telephone at the time of interpretation on 11/23/2014 at 3:38 am to Dr. Andrey CampanileWilson, who verbally acknowledged these results.   Electronically Signed   By: Rubye OaksMelanie  Ehinger M.D.   On: 11/23/2014 03:39   Ct Image Guided Drainage By Percutaneous Catheter  11/23/2014   INDICATION: History of diverticular abscess. Please perform CT-guided percutaneous drainage catheter placement.  EXAM: CT IMAGE GUIDED DRAINAGE BY PERCUTANEOUS CATHETER  COMPARISON:  CT abdomen pelvis - 11/22/2014; 09/06/2014; 12/22/2006  MEDICATIONS: The patient is currently admitted to the hospital and receiving intravenous antibiotics. The antibiotics were administered within an appropriate time frame prior to the initiation of the procedure.  ANESTHESIA/SEDATION: Fentanyl 50 mcg IV; Versed 1 mg IV  Total Moderate Sedation time  23 minutes  CONTRAST:  None  COMPLICATIONS: None immediate  PROCEDURE: Informed written consent was obtained from the patient after a discussion of the risks, benefits and alternatives to treatment. The patient was placed prone on  the CT gantry and a pre procedural CT was performed re-demonstrating the known abscess/fluid collection within the lobulated fluid collection with the left hemipelvis with dominant collection measuring approximately 5.8 x 2.0 cm (image 37, series 2. The procedure was planned. A timeout was performed prior to the initiation of the procedure.  The left buttocks was prepped and draped in the usual sterile fashion. The overlying soft tissues were anesthetized with 1% lidocaine with epinephrine. Appropriate trajectory was planned with the use of a 22 gauge spinal needle. An 18 gauge trocar needle was advanced into the abscess/fluid collection and a short Amplatz super stiff wire was coiled within the collection. Appropriate positioning was confirmed with a limited CT scan. The tract was serially dilated allowing placement of a 10 [place] all-purpose drainage catheter. Appropriate positioning was confirmed with a limited postprocedural CT scan.  Approximately 10 Ml of serosanguineous fluid was aspirated. The tube was connected to a drainage bag and sutured in place. A dressing was placed. The patient tolerated the procedure well without immediate post procedural complication.  IMPRESSION: Successful CT guided placement of a 10 French all purpose drain catheter into the left hemipelvis with aspiration of 10 mL of serosanguineous fluid. Samples were sent to the laboratory as requested by the ordering clinical team.   Electronically Signed   By: Simonne ComeJohn  Watts M.D.   On: 11/23/2014 14:57    Anti-infectives: Anti-infectives    Start  Dose/Rate Route Frequency Ordered Stop   11/22/14 1500  ciprofloxacin (CIPRO) IVPB 400 mg     400 mg200 mL/hr over 60 Minutes Intravenous Every 12 hours 11/22/14 1447     11/22/14 1500  metroNIDAZOLE (FLAGYL) IVPB 500 mg     500 mg100 mL/hr over 60 Minutes Intravenous Every 8 hours 11/22/14 1447        Assessment/Plan: Sigmoid diverticulitis with stricture, pericolonic abscess, and  partial colonic obstruction area neck sign symptomatically the obstruction is improving. Radiographically appears to be much less as well. Allow clear liquids.  Successful percutaneous drainage of pericolonic abscess yesterday.  Continue Cipro and Flagyl   Restart Lovenox  Timing of surgical intervention will depend on response to these therapies.  startNA.  Aortic stenosis Tobacco abuse and mild COPD.    LOS: 2 days    Claud Kelp M 11/24/2014

## 2014-11-24 NOTE — Progress Notes (Signed)
Referring Physician(s): CCS  Subjective: Patient states lower abdominal pain is improving since yesterday's procedure, she does c/o sharp shooting pains in abdomen with drinking liquids-RN aware. She denies any BM  Allergies: Bactrim and Codeine  Medications: Prior to Admission medications   Medication Sig Start Date End Date Taking? Authorizing Provider  albuterol (PROAIR HFA) 108 (90 BASE) MCG/ACT inhaler Inhale 2 puffs into the lungs every 6 (six) hours as needed for wheezing or shortness of breath. 02/11/14  Yes Coralyn HellingVineet Sood, MD  aspirin 81 MG tablet Take 81 mg by mouth every other day.    Yes Historical Provider, MD  fluticasone (FLONASE) 50 MCG/ACT nasal spray USE TWO SPRAY(S) IN EACH NOSTRIL ONCE DAILY 09/13/14  Yes Coralyn HellingVineet Sood, MD  levothyroxine (SYNTHROID, LEVOTHROID) 100 MCG tablet Take 100 mcg by mouth daily before breakfast.   Yes Historical Provider, MD  lisinopril (PRINIVIL,ZESTRIL) 10 MG tablet Take 10 mg by mouth daily.   Yes Historical Provider, MD  pantoprazole (PROTONIX) 40 MG tablet Take 40 mg by mouth daily.   Yes Historical Provider, MD    Review of Systems  Vital Signs: BP 128/60 mmHg  Pulse 84  Temp(Src) 98.1 F (36.7 C) (Oral)  Resp 16  Ht 5\' 3"  (1.6 m)  Wt 127 lb 13.9 oz (58 kg)  BMI 22.66 kg/m2  SpO2 95%  Physical Exam General: A&Ox3, NAD ABD: Soft, (+) BS, tenderness TP in lower abdomen, Left TG drain intact serosang output 30 cc in 24 hours, < 5 cc in bag, Cx No growth/pending  Imaging: Dg Abd 1 View  11/22/2014   CLINICAL DATA:  Abdominal pain and distention  EXAM: ABDOMEN - 1 VIEW  COMPARISON:  11/15/2014 barium enema  FINDINGS: Moderate stool burden. No dilated gas-filled loop of bowel is identified. Presence or absence of air-fluid levels or free air is suboptimally evaluated on this supine projection. Splenic arterial calcification reidentified over the left upper quadrant. L4-L5 disc degenerative change noted.  IMPRESSION: Normal bowel gas  pattern.  Moderate stool burden.   Electronically Signed   By: Christiana PellantGretchen  Green M.D.   On: 11/22/2014 19:03   Ct Abdomen Pelvis W Contrast  11/23/2014   CLINICAL DATA:  Left lower quadrant abdominal pain. Recent colonoscopy.  EXAM: CT ABDOMEN AND PELVIS WITH CONTRAST  TECHNIQUE: Multidetector CT imaging of the abdomen and pelvis was performed using the standard protocol following bolus administration of intravenous contrast. Enteric and rectal contrast administered per request.  CONTRAST:  100mL OMNIPAQUE IOHEXOL 300 MG/ML  SOLN  COMPARISON:  CT 09/06/2014  FINDINGS: Motion artifact through the lung bases. Small calcification at the left costophrenic angle, unchanged.  There is colonic wall thickening in the sigmoid colon spanning greater than 10 cm with surrounding inflammatory change. There is an extraluminal pericolonic fluid collection measuring 5.6 x 6.0 x 5.4 cm in the left lower quadrant with multiple foci of internal air. Multiple diverticular seen in this region, this may reflect a or peri diverticular abscess. This is at site of previous acute diverticulitis. There is a large volume of stool throughout the remainder of the colon. No small bowel dilatation. There is no free air in the upper abdomen.  No focal hepatic lesion. Gallbladder is physiologically distended. Pancreas and spleen are normal. Mild left adrenal thickening without discrete nodule. Kidneys demonstrate symmetric enhancement and excretion without hydronephrosis. Subcentimeter cyst in the left kidney.  Atherosclerosis of the abdominal aorta which flares to 2.2 cm minimally distally but no aneurysmal dilatation. There is no  retroperitoneal adenopathy.  No intra-abdominal ascites.  The bladder is decompressed, suggestion of bladder wall thickening. There is no intravesicular air. The uterus is surgically absent.  Small sclerotic focus in L5, likely bone island. There is degenerative change in the lumbar spine.  IMPRESSION: 1. Diffuse colonic  wall thickening involving the sigmoid colon, spanning at least 10 cm. There is a pericolonic abscess measuring up to 6 cm. This may reflect a diverticular abscess, however neoplasm is not entirely excluded. 2. Large volume of stool throughout the entire colon.  3. Suggestion of bladder wall thickening, likely reactive. There is no intravesicular air to suggest colovesicular fistula. These results were called by telephone at the time of interpretation on 11/23/2014 at 3:38 am to Dr. Andrey Campanile, who verbally acknowledged these results.   Electronically Signed   By: Rubye Oaks M.D.   On: 11/23/2014 03:39   Ct Image Guided Drainage By Percutaneous Catheter  11/23/2014   INDICATION: History of diverticular abscess. Please perform CT-guided percutaneous drainage catheter placement.  EXAM: CT IMAGE GUIDED DRAINAGE BY PERCUTANEOUS CATHETER  COMPARISON:  CT abdomen pelvis - 11/22/2014; 09/06/2014; 12/22/2006  MEDICATIONS: The patient is currently admitted to the hospital and receiving intravenous antibiotics. The antibiotics were administered within an appropriate time frame prior to the initiation of the procedure.  ANESTHESIA/SEDATION: Fentanyl 50 mcg IV; Versed 1 mg IV  Total Moderate Sedation time  23 minutes  CONTRAST:  None  COMPLICATIONS: None immediate  PROCEDURE: Informed written consent was obtained from the patient after a discussion of the risks, benefits and alternatives to treatment. The patient was placed prone on the CT gantry and a pre procedural CT was performed re-demonstrating the known abscess/fluid collection within the lobulated fluid collection with the left hemipelvis with dominant collection measuring approximately 5.8 x 2.0 cm (image 37, series 2. The procedure was planned. A timeout was performed prior to the initiation of the procedure.  The left buttocks was prepped and draped in the usual sterile fashion. The overlying soft tissues were anesthetized with 1% lidocaine with epinephrine.  Appropriate trajectory was planned with the use of a 22 gauge spinal needle. An 18 gauge trocar needle was advanced into the abscess/fluid collection and a short Amplatz super stiff wire was coiled within the collection. Appropriate positioning was confirmed with a limited CT scan. The tract was serially dilated allowing placement of a 10 [place] all-purpose drainage catheter. Appropriate positioning was confirmed with a limited postprocedural CT scan.  Approximately 10 Ml of serosanguineous fluid was aspirated. The tube was connected to a drainage bag and sutured in place. A dressing was placed. The patient tolerated the procedure well without immediate post procedural complication.  IMPRESSION: Successful CT guided placement of a 10 French all purpose drain catheter into the left hemipelvis with aspiration of 10 mL of serosanguineous fluid. Samples were sent to the laboratory as requested by the ordering clinical team.   Electronically Signed   By: Simonne Come M.D.   On: 11/23/2014 14:57    Labs:  CBC:  Recent Labs  11/22/14 1540 11/24/14 0500  WBC 8.9 8.9  HGB 11.8* 9.9*  HCT 36.8 30.8*  PLT 403* 327    COAGS:  Recent Labs  11/23/14 1148  INR 1.07    BMP:  Recent Labs  11/22/14 1540 11/24/14 0500  NA 132* 133*  K 4.0 4.0  CL 96 100  CO2 27 27  GLUCOSE 94 110*  BUN 11 <5*  CALCIUM 9.5 8.2*  CREATININE 0.53 0.51  GFRNONAA >90 >90  GFRAA >90 >90    LIVER FUNCTION TESTS:  Recent Labs  11/22/14 1540  BILITOT 0.5  AST 20  ALT 11  ALKPHOS 88  PROT 6.6  ALBUMIN 2.9*    Assessment and Plan: Sigmoid diverticulitis with stricture, pericolonic abscess Partial colonic obstruction-slowly improving S/p left transgluteal percutaneous drain placement 2/6 with 10 cc aspiration and 30cc out over last 24 hours Continue to flush TID, monitor output and follow Cx Plans per CCS    I spent a total of 15 minutes face to face in clinical consultation/evaluation, greater than  50% of which was counseling/coordinating care   Signed: Berneta Levins 11/24/2014, 10:24 AM

## 2014-11-24 NOTE — Progress Notes (Addendum)
PARENTERAL NUTRITION CONSULT NOTE - INITIAL  Pharmacy Consult for TPN Indication: large bowel obstruction  Allergies  Allergen Reactions  . Bactrim [Sulfamethoxazole-Trimethoprim] Hives, Itching and Other (See Comments)    Irregular heart beat  . Codeine Palpitations    Dizziness, sweats     Patient Measurements: Height: 5\' 3"  (160 cm) Weight: 127 lb 13.9 oz (58 kg) IBW/kg (Calculated) : 52.4  Usual weight: 60.5kg   Vital Signs: Temp: 97.8 F (36.6 C) (02/07 0500) Temp Source: Oral (02/07 0500) BP: 134/61 mmHg (02/07 0500) Pulse Rate: 85 (02/07 0500) Intake/Output from previous day: 02/06 0701 - 02/07 0700 In: 1858.8 [I.V.:1848.8] Out: 1280 [Urine:1250; Drains:30] Intake/Output from this shift:    Labs:  Recent Labs  11/22/14 1540 11/23/14 1148 11/24/14 0500  WBC 8.9  --  8.9  HGB 11.8*  --  9.9*  HCT 36.8  --  30.8*  PLT 403*  --  327  INR  --  1.07  --      Recent Labs  11/22/14 1540 11/24/14 0500  NA 132* 133*  K 4.0 4.0  CL 96 100  CO2 27 27  GLUCOSE 94 110*  BUN 11 <5*  CREATININE 0.53 0.51  CALCIUM 9.5 8.2*  PROT 6.6  --   ALBUMIN 2.9*  --   AST 20  --   ALT 11  --   ALKPHOS 88  --   BILITOT 0.5  --    Estimated Creatinine Clearance: 50.3 mL/min (by C-G formula based on Cr of 0.51).   No results for input(s): GLUCAP in the last 72 hours.  Insulin Requirements in the past 24 hours:  None ordered  Current Nutrition:  Clear liquid diet started this morning- prior to this was NPO  Nutritional Goals:  1680-1880 kCal, 75-90 grams of protein per day per RD recommendations 2/6  Admit: [name] here after having constipation for several weeks and recent colonoscopy revealed stricture of sigmoid colon. CT revealed pericolonic abscess- now s/p percutaneous drainage of abscess.  GI: reported "food would not stay down" for ~3 months and has had significant weight loss in the past 1 month. Has been having BMs and flatus- obstruction  radiographically and symptomatically improving. On IV PPI  Endo: no history, no A1C or TSH on file. No meds. Serum glucs 94-110  Lytes: Na low at 133, Cl nml, K nml at 4, CorCa 8.8  Renal: SCr 0.51, est CrCl ~1850mL/min. Has NS with 20mEq KCL running at 9075mL/hr  Pulm: hx COPD- Brovana, Pulmicort  Cards: hx HTN and aortic stenosis  Hepatobil: LFTs from 2/5- all WNL except low albumin at 2.9  Neuro: no acute issues  ID: cipro and Flagyl started 2/5. WBC 8.9  Best Practices: Lovenox (resumed today after drainage)  TPN Access: right basilic double lumen PICC TPN day#: 0 (starting 2/7)  Plan:  -get phosphorous and magnesium added on to this morning's BMET for baseline values. Will replace if needed. -start Clinimix E 5/15 at 6235mL/hr + 20% IVFE at 710mL/hr- this will provide 42g protein and 1076kcal in a 24h period *goal rate will be Clinimix 5/15 at 6375mL/hr + fats at 3810mL/hr daily- this will provide 90g protein and 1758kcal daily which will meet 100% of goals -full multivitamin and trace elements in TPN -start CBGs and sensitive SSI q6h -change MIVF to NS (WITHOUT KCL) at 6340mL/hr starting at 1800 tonight when TPN is hung -TPN labs as ordered as per protocol -will follow diet advancement, clinical progression, surgical plans  Asheley Hellberg D. Eustacio Ellen, PharmD, BCPS Clinical Pharmacist Pager: [phone] 11/24/2014 7:40 AM   ADDENDUM Phosphorous came back at 2.9, mag 2- no need for repletion  Maysen Bonsignore D. Camila Maita, PharmD, BCPS Clinical Pharmacist Pager: [phone] 11/24/2014 9:24 AM

## 2014-11-25 DIAGNOSIS — J449 Chronic obstructive pulmonary disease, unspecified: Secondary | ICD-10-CM

## 2014-11-25 DIAGNOSIS — K5660 Unspecified intestinal obstruction: Secondary | ICD-10-CM

## 2014-11-25 LAB — COMPREHENSIVE METABOLIC PANEL
ALBUMIN: 2.2 g/dL — AB (ref 3.5–5.2)
ALK PHOS: 77 U/L (ref 39–117)
ALT: 8 U/L (ref 0–35)
ANION GAP: 3 — AB (ref 5–15)
AST: 17 U/L (ref 0–37)
BUN: <5 mg/dL — ABNORMAL LOW (ref 6–23)
CO2: 28 mmol/L (ref 19–32)
CREATININE: 0.51 mg/dL (ref 0.50–1.10)
Calcium: 7.9 mg/dL — ABNORMAL LOW (ref 8.4–10.5)
Chloride: 101 mmol/L (ref 96–112)
GFR calc Af Amer: >90 mL/min (ref >90)
Glucose, Bld: 131 mg/dL — ABNORMAL HIGH (ref 70–99)
POTASSIUM: 3.7 mmol/L (ref 3.5–5.1)
SODIUM: 132 mmol/L — AB (ref 135–145)
TOTAL PROTEIN: 5.4 g/dL — AB (ref 6.0–8.3)
Total Bilirubin: 0.1 mg/dL — ABNORMAL LOW (ref 0.3–1.2)

## 2014-11-25 LAB — CBC
HEMATOCRIT: 31.6 % — AB (ref 36.0–46.0)
Hemoglobin: 10.1 g/dL — ABNORMAL LOW (ref 12.0–15.0)
MCH: 28.1 pg (ref 26.0–34.0)
MCHC: 32 g/dL (ref 30.0–36.0)
MCV: 88 fL (ref 78.0–100.0)
Platelets: 295 10*3/uL (ref 150–400)
RBC: 3.59 MIL/uL — AB (ref 3.87–5.11)
RDW: 13.8 % (ref 11.5–15.5)
WBC: 8.5 10*3/uL (ref 4.0–10.5)

## 2014-11-25 LAB — PHOSPHORUS: PHOSPHORUS: 2.4 mg/dL (ref 2.3–4.6)

## 2014-11-25 LAB — DIFFERENTIAL
Basophils Absolute: 0 10*3/uL (ref 0.0–0.1)
Basophils Relative: 0 % (ref 0–1)
EOS ABS: 0.2 10*3/uL (ref 0.0–0.7)
EOS PCT: 2 % (ref 0–5)
LYMPHS PCT: 15 % (ref 12–46)
Lymphs Abs: 1.3 10*3/uL (ref 0.7–4.0)
Monocytes Absolute: 0.8 10*3/uL (ref 0.1–1.0)
Monocytes Relative: 10 % (ref 3–12)
NEUTROS ABS: 6.2 10*3/uL (ref 1.7–7.7)
Neutrophils Relative %: 73 % (ref 43–77)

## 2014-11-25 LAB — TRIGLYCERIDES: Triglycerides: 107 mg/dL (ref <150)

## 2014-11-25 LAB — PREALBUMIN: Prealbumin: 6.3 mg/dL — ABNORMAL LOW (ref 17.0–34.0)

## 2014-11-25 LAB — GLUCOSE, CAPILLARY
GLUCOSE-CAPILLARY: 115 mg/dL — AB (ref 70–99)
Glucose-Capillary: 125 mg/dL — ABNORMAL HIGH (ref 70–99)

## 2014-11-25 LAB — MAGNESIUM: MAGNESIUM: 1.9 mg/dL (ref 1.5–2.5)

## 2014-11-25 MED ORDER — MAGNESIUM SULFATE IN D5W 10-5 MG/ML-% IV SOLN
1.0000 g | Freq: Once | INTRAVENOUS | Status: AC
  Administered 2014-11-25: 1 g via INTRAVENOUS
  Filled 2014-11-25: qty 100

## 2014-11-25 MED ORDER — FAT EMULSION 20 % IV EMUL
240.0000 mL | INTRAVENOUS | Status: AC
Start: 2014-11-25 — End: 2014-11-26
  Administered 2014-11-25: 240 mL via INTRAVENOUS
  Filled 2014-11-25: qty 250

## 2014-11-25 MED ORDER — SODIUM PHOSPHATE 3 MMOLE/ML IV SOLN
15.0000 mmol | Freq: Once | INTRAVENOUS | Status: AC
  Administered 2014-11-25: 15 mmol via INTRAVENOUS
  Filled 2014-11-25: qty 5

## 2014-11-25 MED ORDER — ALBUTEROL SULFATE (2.5 MG/3ML) 0.083% IN NEBU: 2.5000 mg | INHALATION_SOLUTION | RESPIRATORY_TRACT | Status: DC | PRN

## 2014-11-25 MED ORDER — SODIUM CHLORIDE 0.9 % IV SOLN
INTRAVENOUS | Status: DC
  Administered 2014-11-27 – 2014-12-01 (×3): via INTRAVENOUS

## 2014-11-25 MED ORDER — TRACE MINERALS CR-CU-F-FE-I-MN-MO-SE-ZN IV SOLN
INTRAVENOUS | Status: AC
  Administered 2014-11-25: 18:00:00 via INTRAVENOUS
  Filled 2014-11-25: qty 1200

## 2014-11-25 NOTE — Progress Notes (Signed)
Referring Physician(s): CCS  Subjective:  Pericolonic abscess Transgluteal drain placed 2/6 Feeling better Output diminishing   Allergies: Bactrim and Codeine  Medications: Prior to Admission medications   Medication Sig Start Date End Date Taking? Authorizing Provider  albuterol (PROAIR HFA) 108 (90 BASE) MCG/ACT inhaler Inhale 2 puffs into the lungs every 6 (six) hours as needed for wheezing or shortness of breath. 02/11/14  Yes Coralyn HellingVineet Sood, MD  aspirin 81 MG tablet Take 81 mg by mouth every other day.    Yes Historical Provider, MD  fluticasone (FLONASE) 50 MCG/ACT nasal spray USE TWO SPRAY(S) IN EACH NOSTRIL ONCE DAILY 09/13/14  Yes Coralyn HellingVineet Sood, MD  levothyroxine (SYNTHROID, LEVOTHROID) 100 MCG tablet Take 100 mcg by mouth daily before breakfast.   Yes Historical Provider, MD  lisinopril (PRINIVIL,ZESTRIL) 10 MG tablet Take 10 mg by mouth daily.   Yes Historical Provider, MD  pantoprazole (PROTONIX) 40 MG tablet Take 40 mg by mouth daily.   Yes Historical Provider, MD    Review of Systems  Vital Signs: BP 134/59 mmHg  Pulse 79  Temp(Src) 98.4 F (36.9 C) (Oral)  Resp 16  Ht 5\' 3"  (1.6 m)  Wt 58 kg (127 lb 13.9 oz)  BMI 22.66 kg/m2  SpO2 95%  Physical Exam  Skin:  Site of drain is NT No bleeding Clean and dry Output minimal Serous fluid 10 cc yesterday; scant in drain bag Cx NG so far    Imaging: Dg Abd 1 View  11/22/2014   CLINICAL DATA:  Abdominal pain and distention  EXAM: ABDOMEN - 1 VIEW  COMPARISON:  11/15/2014 barium enema  FINDINGS: Moderate stool burden. No dilated gas-filled loop of bowel is identified. Presence or absence of air-fluid levels or free air is suboptimally evaluated on this supine projection. Splenic arterial calcification reidentified over the left upper quadrant. L4-L5 disc degenerative change noted.  IMPRESSION: Normal bowel gas pattern.  Moderate stool burden.   Electronically Signed   By: Christiana PellantGretchen  Green M.D.   On: 11/22/2014 19:03     Ct Abdomen Pelvis W Contrast  11/23/2014   CLINICAL DATA:  Left lower quadrant abdominal pain. Recent colonoscopy.  EXAM: CT ABDOMEN AND PELVIS WITH CONTRAST  TECHNIQUE: Multidetector CT imaging of the abdomen and pelvis was performed using the standard protocol following bolus administration of intravenous contrast. Enteric and rectal contrast administered per request.  CONTRAST:  100mL OMNIPAQUE IOHEXOL 300 MG/ML  SOLN  COMPARISON:  CT 09/06/2014  FINDINGS: Motion artifact through the lung bases. Small calcification at the left costophrenic angle, unchanged.  There is colonic wall thickening in the sigmoid colon spanning greater than 10 cm with surrounding inflammatory change. There is an extraluminal pericolonic fluid collection measuring 5.6 x 6.0 x 5.4 cm in the left lower quadrant with multiple foci of internal air. Multiple diverticular seen in this region, this may reflect a or peri diverticular abscess. This is at site of previous acute diverticulitis. There is a large volume of stool throughout the remainder of the colon. No small bowel dilatation. There is no free air in the upper abdomen.  No focal hepatic lesion. Gallbladder is physiologically distended. Pancreas and spleen are normal. Mild left adrenal thickening without discrete nodule. Kidneys demonstrate symmetric enhancement and excretion without hydronephrosis. Subcentimeter cyst in the left kidney.  Atherosclerosis of the abdominal aorta which flares to 2.2 cm minimally distally but no aneurysmal dilatation. There is no retroperitoneal adenopathy.  No intra-abdominal ascites.  The bladder is decompressed, suggestion of bladder  wall thickening. There is no intravesicular air. The uterus is surgically absent.  Small sclerotic focus in L5, likely bone island. There is degenerative change in the lumbar spine.  IMPRESSION: 1. Diffuse colonic wall thickening involving the sigmoid colon, spanning at least 10 cm. There is a pericolonic abscess  measuring up to 6 cm. This may reflect a diverticular abscess, however neoplasm is not entirely excluded. 2. Large volume of stool throughout the entire colon.  3. Suggestion of bladder wall thickening, likely reactive. There is no intravesicular air to suggest colovesicular fistula. These results were called by telephone at the time of interpretation on 11/23/2014 at 3:38 am to Dr. Andrey Campanile, who verbally acknowledged these results.   Electronically Signed   By: Rubye Oaks M.D.   On: 11/23/2014 03:39   Ct Image Guided Drainage By Percutaneous Catheter  11/23/2014   INDICATION: History of diverticular abscess. Please perform CT-guided percutaneous drainage catheter placement.  EXAM: CT IMAGE GUIDED DRAINAGE BY PERCUTANEOUS CATHETER  COMPARISON:  CT abdomen pelvis - 11/22/2014; 09/06/2014; 12/22/2006  MEDICATIONS: The patient is currently admitted to the hospital and receiving intravenous antibiotics. The antibiotics were administered within an appropriate time frame prior to the initiation of the procedure.  ANESTHESIA/SEDATION: Fentanyl 50 mcg IV; Versed 1 mg IV  Total Moderate Sedation time  23 minutes  CONTRAST:  None  COMPLICATIONS: None immediate  PROCEDURE: Informed written consent was obtained from the patient after a discussion of the risks, benefits and alternatives to treatment. The patient was placed prone on the CT gantry and a pre procedural CT was performed re-demonstrating the known abscess/fluid collection within the lobulated fluid collection with the left hemipelvis with dominant collection measuring approximately 5.8 x 2.0 cm (image 37, series 2. The procedure was planned. A timeout was performed prior to the initiation of the procedure.  The left buttocks was prepped and draped in the usual sterile fashion. The overlying soft tissues were anesthetized with 1% lidocaine with epinephrine. Appropriate trajectory was planned with the use of a 22 gauge spinal needle. An 18 gauge trocar needle was  advanced into the abscess/fluid collection and a short Amplatz super stiff wire was coiled within the collection. Appropriate positioning was confirmed with a limited CT scan. The tract was serially dilated allowing placement of a 10 [place] all-purpose drainage catheter. Appropriate positioning was confirmed with a limited postprocedural CT scan.  Approximately 10 Ml of serosanguineous fluid was aspirated. The tube was connected to a drainage bag and sutured in place. A dressing was placed. The patient tolerated the procedure well without immediate post procedural complication.  IMPRESSION: Successful CT guided placement of a 10 French all purpose drain catheter into the left hemipelvis with aspiration of 10 mL of serosanguineous fluid. Samples were sent to the laboratory as requested by the ordering clinical team.   Electronically Signed   By: Simonne Come M.D.   On: 11/23/2014 14:57    Labs:  CBC:  Recent Labs  11/22/14 1540 11/24/14 0500 11/25/14 0540  WBC 8.9 8.9 8.5  HGB 11.8* 9.9* 10.1*  HCT 36.8 30.8* 31.6*  PLT 403* 327 295    COAGS:  Recent Labs  11/23/14 1148  INR 1.07    BMP:  Recent Labs  11/22/14 1540 11/24/14 0500 11/25/14 0540  NA 132* 133* 132*  K 4.0 4.0 3.7  CL 96 100 101  CO2 GLUCOSE 94 110* 131*  BUN 11 <5* <5*  CALCIUM 9.5 8.2* 7.9*  CREATININE 0.53  0.51 0.51  GFRNONAA >90 >90 >90  GFRAA >90 >90 >90    LIVER FUNCTION TESTS:  Recent Labs  11/22/14 1540 11/25/14 0540  BILITOT 0.5 0.1*  AST 20 17  ALT 11 8  ALKPHOS 88 77  PROT 6.6 5.4*  ALBUMIN 2.9* 2.2*    Assessment and Plan:  Pericolonic abscess TG drain intact Better Output minimal Will need re CT to evaluate collection prior to drain pull   I spent a total of 15 minutes face to face in clinical consultation/evaluation, greater than 50% of which was counseling/coordinating care for pericolonic abscess drain  Signed: Bonney Berres A 11/25/2014, 1:18 PM

## 2014-11-25 NOTE — Progress Notes (Signed)
Name: April Hull MRN: [id number] DOB: Jul 09, 1941    ADMISSION DATE:  11/22/2014 CONSULTATION DATE:  11/22/2014  REFERRING MD : CCS  CHIEF COMPLAINT:  Abdominal pain  BRIEF PATIENT DESCRIPTION:  74 yo female admitted for abdominal pain from colonic stricture, sigmoid diverticulitis and pericolic abscess.  She is followed in pulmonary office by Dr. 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  Pulmonary was asked to assess respiratory status prior to surgery.  SIGNIFICANT EVENTS  2/05 Admit, pulmonary/cardiology consulted 2/06 IR placed drain in Lt hemipelvis  STUDIES:  2/05 CT abd/pelvis >> diffuse colon thickening in sigmoid colon with pericolic abscess  SUBJECTIVE:  Still has constipation, abdominal discomfort.  Denies cough, wheeze, chest congestion, or sinus congestion.  States that she stopped smoking one week prior to coming to hospital.  She does not feel like brovana/pulmicort make any difference.  VITAL SIGNS: Temp:  [97.7 F (36.5 C)-98.5 F (36.9 C)] 97.7 F (36.5 C) (02/08 1346) Pulse Rate:  [79-84] 80 (02/08 1346) Resp:  [16-17] 16 (02/08 1346) BP: (123-146)/(59-97) 123/97 mmHg (02/08 1346) SpO2:  [94 %-97 %] 96 % (02/08 1346)  PHYSICAL EXAMINATION: General: thin Neuro:  Normal strength HEENT:  No sinus tenderness Cardiovascular:  regular Lungs:  No wheeze Abdomen:  Soft, mild LLQ tenderness, drain in LLQ Musculoskeletal:  No edema Skin:  No rashes  CBC Recent Labs     11/22/14  1540  11/24/14  0500  11/25/14  0540  WBC  8.9  8.9  8.5  HGB  11.8*  9.9*  10.1*  HCT  36.8  30.8*  31.6*  PLT  403*  327  295    Coag's Recent Labs     11/23/14  1148  INR  1.07    BMET Recent Labs     11/22/14  1540  11/24/14  0500  11/25/14  0540  NA  132*  133*  132*  K  4.0  4.0  3.7  CL  96  100  101  CO2  BUN  11  <5*  <5*  CREATININE  0.53  0.51  0.51  GLUCOSE  94  110*  131*    Electrolytes Recent  Labs     11/22/14  1540  11/24/14  0500  11/25/14  0540  CALCIUM  9.5  8.2*  7.9*  MG   --   2.0  1.9  PHOS   --   2.9  2.4   Liver Enzymes Recent Labs     11/22/14  1540  11/25/14  0540  AST  20  17  ALT  11  8  ALKPHOS  88  77  BILITOT  0.5  0.1*  ALBUMIN  2.9*  2.2*   Glucose Recent Labs     11/24/14  1305  11/24/14  1800  11/25/14  0003  11/25/14  0557  GLUCAP  81  88  115*  125*    Imaging No results found.    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 Plan: - tentative plan for surgery on 2/10 - Day 4 of cipro/flagyl - f/u abscess cx from 2/06 >> - defer nutrition to CCS  Chronic cough 2nd to mild COPD with continued tobacco abuse (reports recently quitting), UACS, GERD. Plan: - prn albuterol - d/c brovana/pulmicort - bronchial hygiene - prn flonase, nasal irrigation - IV protonix  Summary: There are  no pulmonary contra-indications for her having abdominal surgery.  PCCM will f/u after surgery on 11/27/14.  Call if help needed sooner.  Coralyn HellingVineet Audry Kauzlarich, MD [facility] 11/25/2014, 3:30 PM Pager:  [phone] After 3pm call: [phone]

## 2014-11-25 NOTE — Progress Notes (Signed)
PARENTERAL NUTRITION CONSULT NOTE - FOLLOW UP  Pharmacy Consult:  TPN Indication:  large bowel obstruction  Allergies  Allergen Reactions  . Bactrim [Sulfamethoxazole-Trimethoprim] Hives, Itching and Other (See Comments)    Irregular heart beat  . Codeine Palpitations    Dizziness, sweats     Patient Measurements: Height:  (160 cm) Weight: 127 lb 13.9 oz (58 kg) IBW/kg (Calculated) : 52.4  Usual weight: 61 kg  Vital Signs: Temp: 98.4 F (36.9 C) (02/08 0555) Temp Source: Oral (02/08 0555) BP: 134/59 mmHg (02/08 0555) Pulse Rate: 79 (02/08 0555) Intake/Output from previous day: 02/07 0701 - 02/08 0700 In: 1289.4 [P.O.:360; I.V.:434.7; TPN:489.8] Out: 2460 [Urine:2450; Drains:10]  Labs:  Recent Labs  11/22/14 1540 11/23/14 1148 11/24/14 0500 11/25/14 0540  WBC 8.9  --  8.9 8.5  HGB 11.8*  --  9.9* 10.1*  HCT 36.8  --  30.8* 31.6*  PLT 403*  --  327 295  INR  --  1.07  --   --      Recent Labs  11/22/14 1540 11/24/14 0500 11/25/14 0540  NA 132* 133* 132*  K 4.0 4.0 3.7  CL 96 100 101  CO2 GLUCOSE 94 110* 131*  BUN 11 <5* <5*  CREATININE 0.53 0.51 0.51  CALCIUM 9.5 8.2* 7.9*  MG  --  2.0 1.9  PHOS  --  2.9 2.4  PROT 6.6  --  5.4*  ALBUMIN 2.9*  --  2.2*  AST 20  --  17  ALT 11  --  8  ALKPHOS 88  --  77  BILITOT 0.5  --  0.1*  TRIG  --   --  107   Estimated Creatinine Clearance: 50.3 mL/min (by C-G formula based on Cr of 0.51).    Recent Labs  11/24/14 1800 11/25/14 0003 11/25/14 0557  GLUCAP 88 115* 125*     Insulin Requirements in the past 24 hours:  None  Admit: [name] admited after having constipation for several weeks and recent colonoscopy revealed stricture of sigmoid colon. CT revealed pericolonic abscess, now s/p percutaneous drainage of abscess.  Patient continues on TPN for nurtional support.  GI: reported "food would not stay down" for ~3 months and has had significant weight loss in the past 1 month. Has  been having BMs and flatus - obstruction radiographically and symptomatically improving. On IV PPI Endo: no history DM - CBGs controlled Lytes: mild hyponatremia, others WNL Renal: SCr stable, CrCL 50 ml/min - good UOP 1.8 ml/kg/hr, NS at 40 ml/hr Pulm: COPD - stable on RA - Brovana, Pulmicort Cards: HTN / aortic stenosis - VSS Hepatobil: LFTs / tbili / TG WNL Neuro: no acute issues ID: Cipro/Flagyl D#4 - afebrile, WBC WNL, abscess cx pending Best Practices: Lovenox TPN Access: right basilic double lumen PICC TPN day#: 1 (2/7 >> )  Current Nutrition:  Clear liquid diet - tolerating and MD may advance diet TPN (goal: Clinimix 5/15 at 35mL/hr + IVFE at 9mL/hr daily = 90g protein and 1758 kCal daily)  Nutritional Goals:  1680-1880 kCal, 75-90 grams of protein per day   Plan:  - Increase Clinimix E 5/15 to 50 ml/hr (goal rate 75 ml/hr) + 20% IVFE at 10 ml/hr.  Advance to goal TPN rate once refeeding risk is no longer a concern. - Daily multivitamin and trace elements - Continue sensitive SSI.  D/C if CBGs remain controlled at goal TPN rate. - Decrease IVF to [facility] once new  TPN bag starts - Mag sulfate 1gm IV x 1 - NaPhos 15 mmol IV x 1 - BMET, Mag, Phos in AM    April Hull D. April Hull, PharmD, BCPS Pager:  [phone] 11/25/2014, 11:54 AM

## 2014-11-25 NOTE — Progress Notes (Signed)
NUTRITION FOLLOW UP  Pt meets criteria for SEVERE MALNUTRITION in the context of acute illness or injury as evidenced by a 11.6% weight loss in 1 month and severe fat and muscle mass loss.  DOCUMENTATION CODES Per approved criteria  -Severe malnutrition in the context of acute illness or injury   Intervention:    TPN dosing per Pharmacy to meet 100% of estimated needs as able.  Monitor magnesium, potassium, and phosphorus daily for at least 3 days, MD to replete as needed, as pt is at risk for refeeding syndrome given extremely poor PO intake for > 1 week and severe malnutrition.  Nutrition Dx:   Inadequate oral intake related to large bowel obstruction and inability to eat as evidenced by patient report. Ongoing.  Goal:   Intake to meet >90% of estimated nutrition needs.  Monitor:   TPN tolerance/adequacy, ability to start PO diet, weight trend, labs.  Assessment:   [facility] with stricture of sigmoid colon. CT revealed pericolonic abscess.  TPN was initiated on 2/7. Patient is receiving TPN with Clinimix E 5/15 @ 35 ml/hr and lipids @ 10 ml/hr. Provides 1080 ml, 1076 kcal, and 42 grams protein per day. Meets 64% minimum estimated energy needs and 56% minimum estimated protein needs.   Height: Ht Readings from Last 1 Encounters:  11/22/14 5\' 3"  (1.6 m)    Weight Status:   Wt Readings from Last 1 Encounters:  11/24/14 127 lb 13.9 oz (58 kg)   11/22/14 106 lb (48.081 kg)    Re-estimated needs:  Kcal: [id number] Protein: 75-90 grams Fluid: 1.7 - 1.9 L/day  Skin: WDL  Diet Order: Diet clear liquid TPN (CLINIMIX-E) Adult TPN (CLINIMIX-E) Adult   Intake/Output Summary (Last 24 hours) at 11/25/14 1352 Last data filed at 11/25/14 0500  Gross per 24 hour  Intake 924.42 ml  Output   2460 ml  Net -1535.58 ml    Last BM: 2/5   Labs:   Recent Labs Lab 11/22/14 1540 11/24/14 0500 11/25/14 0540  NA 132* 133* 132*  K 4.0 4.0 3.7  CL  96 100 101  CO2 27 27 28   BUN 11 <5* <5*  CREATININE 0.53 0.51 0.51  CALCIUM 9.5 8.2* 7.9*  MG  --  2.0 1.9  PHOS  --  2.9 2.4  GLUCOSE 94 110* 131*    CBG (last 3)   Recent Labs  11/24/14 1800 11/25/14 0003 11/25/14 0557  GLUCAP 88 115* 125*    Scheduled Meds: . antiseptic oral rinse  7 mL Mouth Rinse q12n4p  . arformoterol  15 mcg Nebulization BID  . budesonide  0.25 mg Nebulization BID  . chlorhexidine  15 mL Mouth Rinse BID  . ciprofloxacin  400 mg Intravenous Q12H  . enoxaparin (LOVENOX) injection  40 mg Subcutaneous Q24H  . insulin aspart  0-9 Units Subcutaneous 4 times per day  . magnesium sulfate 1 - 4 g bolus IVPB  1 g Intravenous Once  . metronidazole  500 mg Intravenous Q8H  . pantoprazole (PROTONIX) IV  40 mg Intravenous QHS  . sodium chloride  10-40 mL Intracatheter Q12H  . sodium phosphate  Dextrose 5% IVPB  15 mmol Intravenous Once    Continuous Infusions: . sodium chloride 40 mL/hr at 11/24/14 1808  . sodium chloride    . Marland Kitchen.TPN (CLINIMIX-E) Adult 35 mL/hr at 11/24/14 1807   And  . fat emulsion 240 mL (11/24/14 1807)  . Marland Kitchen.TPN (CLINIMIX-E) Adult  And  . fat emulsion      Joaquin Courts, RD, LDN, CNSC Pager [phone] After Hours Pager [phone]

## 2014-11-25 NOTE — Progress Notes (Signed)
Patient ID: April Hull, female   DOB: 1941-05-10, 74 y.o.   MRN: [id number]     [facility]      [address] [place], [place] [address]    Phone: [phone] FAX: [phone]     Subjective: Intermittent shooting pain, overall better.  Tolerated clears.  VSS.  Afebrile. WBC normal.  Passing flatus, no BM.  Drain with minimal output (42m).  Objective:  Vital signs:  Filed Vitals:   11/24/14 1801 11/24/14 2017 11/24/14 2207 11/25/14 0555  BP: 139/61  146/67 134/59  Pulse: 84  83 79  Temp: 98.1 F (36.7 C)  98.5 F (36.9 C) 98.4 F (36.9 C)  TempSrc: Oral  Oral Oral  Resp: '16  17 16  ' Height:      Weight:      SpO2: 97% 94% 95% 94%    Last BM Date: 11/22/14  Intake/Output   Yesterday:  02/07 0701 - 02/08 0700 In: 1289.4 [P.O.:360; I.V.:434.7; TPN:489.8] Out: 2460 [Urine:2450; Drains:10] This shift:    I/O last 3 completed shifts: In: 3133.2 [P.O.:360; I.V.:2273.4; Other:10] Out: 39937[Urine:3350; Drains:30]    Physical Exam: General: Pt awake/alert/oriented x4 in no acute distress Chest: S1S2 RRR 3/6 SEM, 2/2 pulses.  CV:  Pulses intact.  Regular rhythm Abdomen: Soft.  Nondistended. Mildly tender LLQ.  Perc drain with serosanguinous output.   No evidence of peritonitis.  No incarcerated hernias. Ext:  SCDs BLE.  No mjr edema.  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    Results:   Labs: Results for orders placed or performed during the hospital encounter of 11/22/14 (from the past 48 hour(s))  Protime-INR     Status:  None   Collection Time: 11/23/14 11:48 AM  Result Value Ref Range   Prothrombin Time 14.0 11.6 - 15.2 seconds   INR 1.07 0.00 - 1.49  Culture, routine-abscess     Status: None (Preliminary result)   Collection Time: 11/23/14  2:49 PM  Result Value Ref Range   Specimen Description ABSCESS ABDOMEN    Special Requests NONE    Gram Stain PENDING    Culture      NO GROWTH 2 DAYS Performed at [facility]   Report Status PENDING   Basic metabolic panel     Status: Abnormal   Collection Time: 11/24/14  5:00 AM  Result Value Ref Range   Sodium 133 (L) 135 - 145 mmol/L   Potassium 4.0 3.5 - 5.1 mmol/L   Chloride 100 96 - 112 mmol/L   CO2 27 19 - 32 mmol/L   Glucose, Bld 110 (H) 70 - 99 mg/dL   BUN <5 (L) 6 - 23 mg/dL   Creatinine, Ser 0.51 0.50 - 1.10 mg/dL   Calcium 8.2 (L) 8.4 - 10.5 mg/dL   GFR calc non Af Amer >90 >90 mL/min   GFR calc Af Amer >90 >90 mL/min    Comment: (NOTE) The eGFR has been calculated using the CKD EPI equation. This calculation has not been validated in all clinical situations. eGFR's persistently <90 mL/min signify possible Chronic Kidney  Disease.    Anion gap 6 5 - 15  CBC     Status: Abnormal   Collection Time: 11/24/14  5:00 AM  Result Value Ref Range   WBC 8.9 4.0 - 10.5 K/uL   RBC 3.47 (L) 3.87 - 5.11 MIL/uL   Hemoglobin 9.9 (L) 12.0 - 15.0 g/dL   HCT 30.8 (L) 36.0 - 46.0 %   MCV 88.8 78.0 - 100.0 fL   MCH 28.5 26.0 - 34.0 pg   MCHC 32.1 30.0 - 36.0 g/dL   RDW 14.1 11.5 - 15.5 %   Platelets 327 150 - 400 K/uL  Phosphorus     Status: None   Collection Time: 11/24/14  5:00 AM  Result Value Ref Range   Phosphorus 2.9 2.3 - 4.6 mg/dL  Magnesium     Status: None   Collection Time: 11/24/14  5:00 AM  Result Value Ref Range   Magnesium 2.0 1.5 - 2.5 mg/dL  Glucose, capillary     Status: None   Collection Time: 11/24/14  1:05 PM  Result Value Ref Range   Glucose-Capillary 81 70 - 99 mg/dL  Glucose, capillary     Status: None    Collection Time: 11/24/14  6:00 PM  Result Value Ref Range   Glucose-Capillary 88 70 - 99 mg/dL  Glucose, capillary     Status: Abnormal   Collection Time: 11/25/14 12:03 AM  Result Value Ref Range   Glucose-Capillary 115 (H) 70 - 99 mg/dL  Comprehensive metabolic panel     Status: Abnormal   Collection Time: 11/25/14  5:40 AM  Result Value Ref Range   Sodium 132 (L) 135 - 145 mmol/L   Potassium 3.7 3.5 - 5.1 mmol/L   Chloride 101 96 - 112 mmol/L   CO2 28 19 - 32 mmol/L   Glucose, Bld 131 (H) 70 - 99 mg/dL   BUN <5 (L) 6 - 23 mg/dL   Creatinine, Ser 0.51 0.50 - 1.10 mg/dL   Calcium 7.9 (L) 8.4 - 10.5 mg/dL   Total Protein 5.4 (L) 6.0 - 8.3 g/dL   Albumin 2.2 (L) 3.5 - 5.2 g/dL   AST 17 0 - 37 U/L   ALT 8 0 - 35 U/L   Alkaline Phosphatase 77 39 - 117 U/L   Total Bilirubin 0.1 (L) 0.3 - 1.2 mg/dL   GFR calc non Af Amer >90 >90 mL/min   GFR calc Af Amer >90 >90 mL/min    Comment: (NOTE) The eGFR has been calculated using the CKD EPI equation. This calculation has not been validated in all clinical situations. eGFR's persistently <90 mL/min signify possible Chronic Kidney Disease.    Anion gap 3 (L) 5 - 15  Magnesium     Status: None   Collection Time: 11/25/14  5:40 AM  Result Value Ref Range   Magnesium 1.9 1.5 - 2.5 mg/dL  Phosphorus     Status: None   Collection Time: 11/25/14  5:40 AM  Result Value Ref Range   Phosphorus 2.4 2.3 - 4.6 mg/dL  CBC     Status: Abnormal   Collection Time: 11/25/14  5:40 AM  Result Value Ref Range   WBC 8.5 4.0 - 10.5 K/uL   RBC 3.59 (L) 3.87 - 5.11 MIL/uL   Hemoglobin 10.1 (L) 12.0 - 15.0 g/dL   HCT 31.6 (L) 36.0 - 46.0 %   MCV 88.0 78.0 - 100.0 fL   MCH 28.1 26.0 - 34.0 pg   MCHC 32.0 30.0 -  36.0 g/dL   RDW 13.8 11.5 - 15.5 %   Platelets 295 150 - 400 K/uL  Differential     Status: None   Collection Time: 11/25/14  5:40 AM  Result Value Ref Range   Neutrophils Relative % 73 43 - 77 %   Neutro Abs 6.2 1.7 - 7.7 K/uL    Lymphocytes Relative 15 12 - 46 %   Lymphs Abs 1.3 0.7 - 4.0 K/uL   Monocytes Relative 10 3 - 12 %   Monocytes Absolute 0.8 0.1 - 1.0 K/uL   Eosinophils Relative 2 0 - 5 %   Eosinophils Absolute 0.2 0.0 - 0.7 K/uL   Basophils Relative 0 0 - 1 %   Basophils Absolute 0.0 0.0 - 0.1 K/uL  Triglycerides     Status: None   Collection Time: 11/25/14  5:40 AM  Result Value Ref Range   Triglycerides 107 <150 mg/dL  Glucose, capillary     Status: Abnormal   Collection Time: 11/25/14  5:57 AM  Result Value Ref Range   Glucose-Capillary 125 (H) 70 - 99 mg/dL    Imaging / Studies: Ct Image Guided Drainage By Percutaneous Catheter  11/23/2014   INDICATION: History of diverticular abscess. Please perform CT-guided percutaneous drainage catheter placement.  EXAM: CT IMAGE GUIDED DRAINAGE BY PERCUTANEOUS CATHETER  COMPARISON:  CT abdomen pelvis - 11/22/2014; 09/06/2014; 12/22/2006  MEDICATIONS: The patient is currently admitted to the hospital and receiving intravenous antibiotics. The antibiotics were administered within an appropriate time frame prior to the initiation of the procedure.  ANESTHESIA/SEDATION: Fentanyl 50 mcg IV; Versed 1 mg IV  Total Moderate Sedation time  23 minutes  CONTRAST:  None  COMPLICATIONS: None immediate  PROCEDURE: Informed written consent was obtained from the patient after a discussion of the risks, benefits and alternatives to treatment. The patient was placed prone on the CT gantry and a pre procedural CT was performed re-demonstrating the known abscess/fluid collection within the lobulated fluid collection with the left hemipelvis with dominant collection measuring approximately 5.8 x 2.0 cm (image 37, series 2. The procedure was planned. A timeout was performed prior to the initiation of the procedure.  The left buttocks was prepped and draped in the usual sterile fashion. The overlying soft tissues were anesthetized with 1% lidocaine with epinephrine. Appropriate trajectory was  planned with the use of a 22 gauge spinal needle. An 18 gauge trocar needle was advanced into the abscess/fluid collection and a short Amplatz super stiff wire was coiled within the collection. Appropriate positioning was confirmed with a limited CT scan. The tract was serially dilated allowing placement of a 10 [place] all-purpose drainage catheter. Appropriate positioning was confirmed with a limited postprocedural CT scan.  Approximately 10 Ml of serosanguineous fluid was aspirated. The tube was connected to a drainage bag and sutured in place. A dressing was placed. The patient tolerated the procedure well without immediate post procedural complication.  IMPRESSION: Successful CT guided placement of a 10 French all purpose drain catheter into the left hemipelvis with aspiration of 10 mL of serosanguineous fluid. Samples were sent to the laboratory as requested by the ordering clinical team.   Electronically Signed   By: Sandi Mariscal M.D.   On: 11/23/2014 14:57    Medications / Allergies:  Scheduled Meds: . antiseptic oral rinse  7 mL Mouth Rinse q12n4p  . arformoterol  15 mcg Nebulization BID  . budesonide  0.25 mg Nebulization BID  . chlorhexidine  15 mL Mouth Rinse  BID  . ciprofloxacin  400 mg Intravenous Q12H  . enoxaparin (LOVENOX) injection  40 mg Subcutaneous Q24H  . insulin aspart  0-9 Units Subcutaneous 4 times per day  . metronidazole  500 mg Intravenous Q8H  . pantoprazole (PROTONIX) IV  40 mg Intravenous QHS  . sodium chloride  10-40 mL Intracatheter Q12H   Continuous Infusions: . sodium chloride 40 mL/hr at 11/24/14 1808  . Marland KitchenTPN (CLINIMIX-E) Adult 35 mL/hr at 11/24/14 1807   And  . fat emulsion 240 mL (11/24/14 1807)   PRN Meds:.morphine injection, ondansetron, sodium chloride  Antibiotics: Anti-infectives    Start     Dose/Rate Route Frequency Ordered Stop   11/22/14 1500  ciprofloxacin (CIPRO) IVPB 400 mg     400 mg200 mL/hr over 60 Minutes Intravenous Every 12 hours  11/22/14 1447     11/22/14 1500  metroNIDAZOLE (FLAGYL) IVPB 500 mg     500 mg100 mL/hr over 60 Minutes Intravenous Every 8 hours 11/22/14 1447          Assessment/Plan Sigmoid diverticulitis with stricture, pericolonic abscess and partial colonic obstruction  S/p percutaneous drainage of pericolonic abscess 2/6 -culture negative to date -cipro/flagyl D3/? -tolerating clears, may be able to advance,  Will hold off until Dr. Brantley Stage evaluates the patient -continue with TNA -continue drain care -SCD/lovenox -mobilize -last CT 2/5, repeat in a few days  Erby Pian, [facility] Pager [phone](7A-4:30P) For consults and floor pages call [phone](7A-4:30P)  11/25/2014 8:37 AM

## 2014-11-26 DIAGNOSIS — E43 Unspecified severe protein-calorie malnutrition: Secondary | ICD-10-CM | POA: Insufficient documentation

## 2014-11-26 LAB — GLUCOSE, CAPILLARY
GLUCOSE-CAPILLARY: 136 mg/dL — AB (ref 70–99)
GLUCOSE-CAPILLARY: 128 mg/dL — AB (ref 70–99)
Glucose-Capillary: 114 mg/dL — ABNORMAL HIGH (ref 70–99)
Glucose-Capillary: 108 mg/dL — ABNORMAL HIGH (ref 70–99)

## 2014-11-26 LAB — BASIC METABOLIC PANEL
Anion gap: 9 (ref 5–15)
BUN: 8 mg/dL (ref 6–23)
CALCIUM: 8.1 mg/dL — AB (ref 8.4–10.5)
CHLORIDE: 99 mmol/L (ref 96–112)
CO2: 24 mmol/L (ref 19–32)
CREATININE: 0.44 mg/dL — AB (ref 0.50–1.10)
GFR calc Af Amer: >90 mL/min (ref >90)
GFR calc non Af Amer: >90 mL/min (ref >90)
Glucose, Bld: 87 mg/dL (ref 70–99)
Potassium: 3.8 mmol/L (ref 3.5–5.1)
Sodium: 132 mmol/L — ABNORMAL LOW (ref 135–145)

## 2014-11-26 LAB — PHOSPHORUS: PHOSPHORUS: 2.7 mg/dL (ref 2.3–4.6)

## 2014-11-26 LAB — MAGNESIUM: Magnesium: 2.2 mg/dL (ref 1.5–2.5)

## 2014-11-26 MED ORDER — FAT EMULSION 20 % IV EMUL
240.0000 mL | INTRAVENOUS | Status: AC
  Administered 2014-11-26 – 2014-11-27 (×2): 240 mL via INTRAVENOUS
  Filled 2014-11-26: qty 250

## 2014-11-26 MED ORDER — ENOXAPARIN SODIUM 40 MG/0.4ML ~~LOC~~ SOLN
40.0000 mg | SUBCUTANEOUS | Status: AC
  Administered 2014-11-26: 40 mg via SUBCUTANEOUS

## 2014-11-26 MED ORDER — TRACE MINERALS CR-CU-F-FE-I-MN-MO-SE-ZN IV SOLN
INTRAVENOUS | Status: AC
  Administered 2014-11-26: 18:00:00 via INTRAVENOUS
  Filled 2014-11-26: qty 1800

## 2014-11-26 MED ORDER — CHLORHEXIDINE GLUCONATE 4 % EX LIQD
1.0000 "application " | Freq: Once | CUTANEOUS | Status: AC
  Administered 2014-11-27: 1 via TOPICAL
  Filled 2014-11-26: qty 15

## 2014-11-26 MED ORDER — DEXTROSE 5 % IV SOLN
2.0000 g | INTRAVENOUS | Status: AC
  Administered 2014-11-27 (×2): 2 g via INTRAVENOUS
  Filled 2014-11-26: qty 2

## 2014-11-26 NOTE — Consult Note (Addendum)
WOC consult requested for possible colostomy stoma site marking. Assessed pt while lying and sitting upright on side of bed.  Mark placed within rectus muscles, in line of vision, to area free from folds.  April LericheMark is 5 cm to left of umbilicus and 3 cm below.  There are very pronounced skin folds which occur when pt is sitting up which are located above and below the mark and should be avoided if possible when surgery is performed.  Demonstrated pouch appearance and briefly discussed pouching activities.  Plan to follow-up after surgery for further teaching sessions.  Educational materials left at bedside; pt asks appropriate questions. [name] MSN, RN, CWOCN, [place], CNS [phone]

## 2014-11-26 NOTE — Progress Notes (Signed)
  Subjective: Stable and alert. Minimal pain. Tolerating clear liquid diet. Passing flatus but no stool. Remains on TNA. Remains on IV Cipro and Flagyl. Drain output 35 mL in 24 hours. Afebrile.  Discussed surgical care with patient and Dr. Cornett, who plans Hartmenn resection tomorrow.  Vital signs in last 24 hours: Temp:  [97.7 F (36.5 C)-98.5 F (36.9 C)] 97.7 F (36.5 C) (02/09 0555) Pulse Rate:  [78-84] 78 (02/09 0555) Resp:  [16] 16 (02/09 0555) BP: (118-131)/(60-97) 118/60 mmHg (02/09 0555) SpO2:  [93 %-96 %] 93 % (02/09 0555) Last BM Date: 11/22/14  Intake/Output from previous day: 02/08 0701 - 02/09 0700 In: 1302 [I.V.:552; IV Piggyback:250; TPN:480] Out: 335 [Urine:300; Drains:35] Intake/Output this shift:    General appearance: Alert. No distress. Oriented and cooperative. Resp: clear to auscultation bilaterally GI: Soft. Nondistended. Mild lower abdominal tenderness. No mass.  Lab Results:   Recent Labs  11/24/14 0500 11/25/14 0540  WBC 8.9 8.5  HGB 9.9* 10.1*  HCT 30.8* 31.6*  PLT 327 295   BMET  Recent Labs  11/25/14 0540 11/26/14 0548  NA 132* 132*  K 3.7 3.8  CL 101 99  CO2 28 24  GLUCOSE 131* 87  BUN <5* 8  CREATININE 0.51 0.44*  CALCIUM 7.9* 8.1*   PT/INR  Recent Labs  11/23/14 1148  LABPROT 14.0  INR 1.07   ABG No results for input(s): PHART, HCO3 in the last 72 hours.  Invalid input(s): PCO2, PO2  Studies/Results: No results found.  Anti-infectives: Anti-infectives    Start     Dose/Rate Route Frequency Ordered Stop   11/22/14 1500  ciprofloxacin (CIPRO) IVPB 400 mg     400 mg200 mL/hr over 60 Minutes Intravenous Every 12 hours 11/22/14 1447     11/22/14 1500  metroNIDAZOLE (FLAGYL) IVPB 500 mg     500 mg100 mL/hr over 60 Minutes Intravenous Every 8 hours 11/22/14 1447        Assessment/Plan:  Sigmoid diverticulitis with stricture, pericolonic abscess, and partial colonic obstruction area  symptomatically  the obstruction is improving. Radiographically appears to be much less as well.  A tight stricture, nevertheless. Allow clear liquids. Continue Cipro and Flagyl.  Continue TNA  Dr. Thomas Cornett plans Hartmann resection tomorrow. He is going to discuss this with the patient again today.  Allow Lovenox today, no Lovenox tomorrow.  Successful percutaneous drainage of pericolonic abscess  Continue Cipro and Flagyl  Aortic stenosis Tobacco abuse and mild COPD.    LOS: 4 days    April Hull 11/26/2014  

## 2014-11-26 NOTE — Care Management Note (Signed)
  Page 1 of 1   11/29/2014     2:21:23 PM CARE MANAGEMENT NOTE 11/29/2014  Patient:  April Hull,April Hull   Account Number:  [id number]  Date Initiated:  11/26/2014  Documentation initiated by:  Ronny FlurryWILE,Reda Gettis  Subjective/Objective Assessment:     Action/Plan:   Gertie GowdaHartmann resection tomorrow   Anticipated DC Date:     Anticipated DC Plan:  SKILLED NURSING FACILITY  In-house referral  Clinical Social Worker         Choice offered to / List presented to:             Status of service:   Medicare Important Message given?  YES (If response is "NO", the following Medicare IM given date fields will be blank) Date Medicare IM given:  11/19/2014 Medicare IM given by:  Ronny FlurryWILE,Kathleene Bergemann Date Additional Medicare IM given:  11/29/2014 Additional Medicare IM given by:  Ronny FlurryHEATHER Maverick Dieudonne  Discharge Disposition:    Per UR Regulation:    If discussed at Long Length of Stay Meetings, dates discussed:    Comments:

## 2014-11-26 NOTE — Progress Notes (Signed)
PARENTERAL NUTRITION CONSULT NOTE - FOLLOW UP  Pharmacy Consult:  TPN Indication:  large bowel obstruction  Allergies  Allergen Reactions  . Bactrim [Sulfamethoxazole-Trimethoprim] Hives, Itching and Other (See Comments)    Irregular heart beat  . Codeine Palpitations    Dizziness, sweats     Patient Measurements: Height: 5\' 3"  (160 cm) Weight: 127 lb 13.9 oz (58 kg) IBW/kg (Calculated) : 52.4  Usual weight: 61 kg  Vital Signs: Temp: 97.7 F (36.5 C) (02/09 0555) Temp Source: Oral (02/09 0555) BP: 118/60 mmHg (02/09 0555) Pulse Rate: 78 (02/09 0555) Intake/Output from previous day: 02/08 0701 - 02/09 0700 In: 1302 [I.V.:552; IV Piggyback:250; TPN:480] Out: 335 [Urine:300; Drains:35]  Labs:  Recent Labs  11/23/14 1148 11/24/14 0500 11/25/14 0540  WBC  --  8.9 8.5  HGB  --  9.9* 10.1*  HCT  --  30.8* 31.6*  PLT  --  327 295  INR 1.07  --   --      Recent Labs  11/24/14 0500 11/25/14 0540 11/26/14 0548  NA 133* 132* 132*  K 4.0 3.7 3.8  CL 100 101 99  CO2 27 28 24   GLUCOSE 110* 131* 87  BUN <5* <5* 8  CREATININE 0.51 0.51 0.44*  CALCIUM 8.2* 7.9* 8.1*  MG 2.0 1.9 2.2  PHOS 2.9 2.4 2.7  PROT  --  5.4*  --   ALBUMIN  --  2.2*  --   AST  --  17  --   ALT  --  8  --   ALKPHOS  --  77  --   BILITOT  --  0.1*  --   PREALBUMIN  --  6.3*  --   TRIG  --  107  --    Estimated Creatinine Clearance: 50.3 mL/min (by C-G formula based on Cr of 0.44).    Recent Labs  11/24/14 1800 11/25/14 0003 11/25/14 0557  GLUCAP 88 115* 125*     Insulin Requirements in the past 24 hours:  2 units SSI  Admit: [name] admited after having constipation for several weeks and recent colonoscopy revealed stricture of sigmoid colon. CT revealed pericolonic abscess, now s/p percutaneous drainage of abscess.  Patient continues on TPN for nurtional support.  GI: reported "food would not stay down" for ~3 months and has had significant weight loss in the past 1 month.   Baseline prealbumin low at 6.3.  Has been having BMs and flatus - obstruction radiographically and symptomatically improving >> Hartmann resection on 2/10.  On IV PPI Endo: no history DM - CBGs controlled but has trended up post TPN initiation Lytes: mild hyponatremia, others WNL Renal: SCr stable, CrCL 50 ml/min - good UOP 1.8 ml/kg/hr, NS at 20 ml/hr Pulm: COPD - stable on RA - Brovana, Pulmicort Cards: HTN / aortic stenosis - VSS Hepatobil: LFTs / tbili / TG WNL Neuro: no acute issues ID: Cipro/Flagyl D#5 - afebrile, WBC WNL, abscess cx pending Best Practices: Lovenox TPN Access: right basilic double lumen PICC TPN day#: 2 (2/7 >> )  Current Nutrition:  Clear liquid diet - tolerating TPN (goal: Clinimix 5/15 at 3275mL/hr + IVFE at 7410mL/hr daily = 90g protein and 1758 kCal daily)  Nutritional Goals:  1680-1880 kCal, 75-90 grams of protein per day   Plan:  - Increase Clinimix E 5/15 to goal rate of 75 ml/hr + 20% IVFE at 10 ml/hr.  TPN provides 1758 kCal and 90gm of protein daily, meeting 100% of patient's needs -  Daily multivitamin and trace elements - Continue sensitive SSI.  D/C if CBGs remain controlled at goal TPN rate. - F/U AM labs    Meisha Salone D. Laney Potash, PharmD, BCPS Pager:  [phone] 11/26/2014, 10:39 AM

## 2014-11-27 ENCOUNTER — Inpatient Hospital Stay (HOSPITAL_COMMUNITY): Payer: Medicare Other | Admitting: Anesthesiology

## 2014-11-27 ENCOUNTER — Encounter (HOSPITAL_COMMUNITY): Admission: RE | Disposition: A | Discharge status: Skilled Nursing Facility | Payer: Self-pay | Source: Ambulatory Visit

## 2014-11-27 HISTORY — PX: COLOSTOMY: SHX63

## 2014-11-27 HISTORY — PX: LAPAROSCOPIC SIGMOID COLECTOMY: SHX5928

## 2014-11-27 LAB — GLUCOSE, CAPILLARY
GLUCOSE-CAPILLARY: 87 mg/dL (ref 70–99)
GLUCOSE-CAPILLARY: 246 mg/dL — AB (ref 70–99)
Glucose-Capillary: 100 mg/dL — ABNORMAL HIGH (ref 70–99)
Glucose-Capillary: 112 mg/dL — ABNORMAL HIGH (ref 70–99)
Glucose-Capillary: 114 mg/dL — ABNORMAL HIGH (ref 70–99)
Glucose-Capillary: 179 mg/dL — ABNORMAL HIGH (ref 70–99)

## 2014-11-27 LAB — BASIC METABOLIC PANEL
Anion gap: 8 (ref 5–15)
BUN: 10 mg/dL (ref 6–23)
CALCIUM: 8.2 mg/dL — AB (ref 8.4–10.5)
CO2: 22 mmol/L (ref 19–32)
CREATININE: 0.48 mg/dL — AB (ref 0.50–1.10)
Chloride: 101 mmol/L (ref 96–112)
GFR calc non Af Amer: >90 mL/min (ref >90)
Glucose, Bld: 102 mg/dL — ABNORMAL HIGH (ref 70–99)
Potassium: 3.9 mmol/L (ref 3.5–5.1)
Sodium: 131 mmol/L — ABNORMAL LOW (ref 135–145)

## 2014-11-27 LAB — MAGNESIUM: Magnesium: 2 mg/dL (ref 1.5–2.5)

## 2014-11-27 LAB — PHOSPHORUS: PHOSPHORUS: 2.9 mg/dL (ref 2.3–4.6)

## 2014-11-27 LAB — SURGICAL PCR SCREEN
MRSA, PCR: NEGATIVE
STAPHYLOCOCCUS AUREUS: NEGATIVE

## 2014-11-27 SURGERY — COLECTOMY, SIGMOID, LAPAROSCOPIC
Anesthesia: General | Site: Abdomen

## 2014-11-27 MED ORDER — ALBUMIN HUMAN 5 % IV SOLN
INTRAVENOUS | Status: DC | PRN
  Administered 2014-11-27: 12:00:00 via INTRAVENOUS

## 2014-11-27 MED ORDER — ONDANSETRON HCL 4 MG/2ML IJ SOLN
INTRAMUSCULAR | Status: AC
  Filled 2014-11-27: qty 2

## 2014-11-27 MED ORDER — ONDANSETRON HCL 4 MG/2ML IJ SOLN: 4.0000 mg | Freq: Four times a day (QID) | INTRAMUSCULAR | Status: DC | PRN

## 2014-11-27 MED ORDER — BUPIVACAINE-EPINEPHRINE (PF) 0.25% -1:200000 IJ SOLN
INTRAMUSCULAR | Status: AC
  Filled 2014-11-27: qty 30

## 2014-11-27 MED ORDER — LACTATED RINGERS IV SOLN
INTRAVENOUS | Status: DC | PRN
  Administered 2014-11-27 (×2): via INTRAVENOUS

## 2014-11-27 MED ORDER — LIDOCAINE HCL (CARDIAC) 20 MG/ML IV SOLN
INTRAVENOUS | Status: AC
  Filled 2014-11-27: qty 5

## 2014-11-27 MED ORDER — DEXTROSE 5 % IV SOLN
2.0000 g | INTRAVENOUS | Status: DC
  Filled 2014-11-27: qty 2

## 2014-11-27 MED ORDER — HYDROMORPHONE HCL 1 MG/ML IJ SOLN
INTRAMUSCULAR | Status: AC
  Filled 2014-11-27: qty 1

## 2014-11-27 MED ORDER — FENTANYL CITRATE 0.05 MG/ML IJ SOLN
INTRAMUSCULAR | Status: DC | PRN
  Administered 2014-11-27 (×2): 50 ug via INTRAVENOUS
  Administered 2014-11-27: 100 ug via INTRAVENOUS
  Administered 2014-11-27 (×4): 50 ug via INTRAVENOUS
  Administered 2014-11-27: 100 ug via INTRAVENOUS

## 2014-11-27 MED ORDER — GLYCOPYRROLATE 0.2 MG/ML IJ SOLN
INTRAMUSCULAR | Status: AC
  Filled 2014-11-27: qty 3

## 2014-11-27 MED ORDER — TRACE MINERALS CR-CU-F-FE-I-MN-MO-SE-ZN IV SOLN
INTRAVENOUS | Status: AC
  Administered 2014-11-27: 18:00:00 via INTRAVENOUS
  Filled 2014-11-27: qty 1800

## 2014-11-27 MED ORDER — GLYCOPYRROLATE 0.2 MG/ML IJ SOLN
INTRAMUSCULAR | Status: DC | PRN
  Administered 2014-11-27: 0.6 mg via INTRAVENOUS

## 2014-11-27 MED ORDER — FENTANYL CITRATE 0.05 MG/ML IJ SOLN
INTRAMUSCULAR | Status: AC
  Filled 2014-11-27: qty 5

## 2014-11-27 MED ORDER — PHENYLEPHRINE HCL 10 MG/ML IJ SOLN
INTRAMUSCULAR | Status: DC | PRN
  Administered 2014-11-27: 80 ug via INTRAVENOUS

## 2014-11-27 MED ORDER — SODIUM CHLORIDE 0.9 % IJ SOLN
INTRAMUSCULAR | Status: AC
  Filled 2014-11-27: qty 10

## 2014-11-27 MED ORDER — NALOXONE HCL 0.4 MG/ML IJ SOLN: 0.4000 mg | INTRAMUSCULAR | Status: DC | PRN

## 2014-11-27 MED ORDER — PHENYLEPHRINE HCL 10 MG/ML IJ SOLN
10.0000 mg | INTRAVENOUS | Status: DC | PRN
  Administered 2014-11-27: 50 ug/min via INTRAVENOUS

## 2014-11-27 MED ORDER — ARTIFICIAL TEARS OP OINT
TOPICAL_OINTMENT | OPHTHALMIC | Status: AC
  Filled 2014-11-27: qty 3.5

## 2014-11-27 MED ORDER — DIPHENHYDRAMINE HCL 50 MG/ML IJ SOLN: 12.5000 mg | Freq: Four times a day (QID) | INTRAMUSCULAR | Status: DC | PRN

## 2014-11-27 MED ORDER — SUCCINYLCHOLINE CHLORIDE 20 MG/ML IJ SOLN
INTRAMUSCULAR | Status: AC
Start: 2014-11-27 — End: 2014-11-27
  Filled 2014-11-27: qty 2

## 2014-11-27 MED ORDER — DIPHENHYDRAMINE HCL 12.5 MG/5ML PO ELIX: 12.5000 mg | ORAL_SOLUTION | Freq: Four times a day (QID) | ORAL | Status: DC | PRN

## 2014-11-27 MED ORDER — 0.9 % SODIUM CHLORIDE (POUR BTL) OPTIME
TOPICAL | Status: DC | PRN
  Administered 2014-11-27: 2000 mL
  Administered 2014-11-27: 1000 mL

## 2014-11-27 MED ORDER — ONDANSETRON HCL 4 MG/2ML IJ SOLN
INTRAMUSCULAR | Status: DC | PRN
Start: 2014-11-27 — End: 2014-11-27
  Administered 2014-11-27: 4 mg via INTRAVENOUS

## 2014-11-27 MED ORDER — FAT EMULSION 20 % IV EMUL
240.0000 mL | INTRAVENOUS | Status: AC
  Administered 2014-11-27: 240 mL via INTRAVENOUS
  Filled 2014-11-27: qty 250

## 2014-11-27 MED ORDER — ROCURONIUM BROMIDE 100 MG/10ML IV SOLN
INTRAVENOUS | Status: DC | PRN
Start: 2014-11-27 — End: 2014-11-27
  Administered 2014-11-27: 10 mg via INTRAVENOUS
  Administered 2014-11-27: 20 mg via INTRAVENOUS
  Administered 2014-11-27: 50 mg via INTRAVENOUS

## 2014-11-27 MED ORDER — PROPOFOL 10 MG/ML IV BOLUS
INTRAVENOUS | Status: AC
  Filled 2014-11-27: qty 20

## 2014-11-27 MED ORDER — SUCCINYLCHOLINE CHLORIDE 20 MG/ML IJ SOLN
INTRAMUSCULAR | Status: DC | PRN
  Administered 2014-11-27: 80 mg via INTRAVENOUS

## 2014-11-27 MED ORDER — NEOSTIGMINE METHYLSULFATE 10 MG/10ML IV SOLN
INTRAVENOUS | Status: DC | PRN
  Administered 2014-11-27: 4 mg via INTRAVENOUS

## 2014-11-27 MED ORDER — EPHEDRINE SULFATE 50 MG/ML IJ SOLN
INTRAMUSCULAR | Status: AC
Start: 2014-11-27 — End: 2014-11-27
  Filled 2014-11-27: qty 1

## 2014-11-27 MED ORDER — BUPIVACAINE-EPINEPHRINE 0.25% -1:200000 IJ SOLN
INTRAMUSCULAR | Status: DC | PRN
  Administered 2014-11-27: 30 mL

## 2014-11-27 MED ORDER — ESMOLOL HCL 10 MG/ML IV SOLN
INTRAVENOUS | Status: DC | PRN
  Administered 2014-11-27: 10 mg via INTRAVENOUS
  Administered 2014-11-27: 20 mg via INTRAVENOUS
  Administered 2014-11-27: 10 mg via INTRAVENOUS

## 2014-11-27 MED ORDER — ARTIFICIAL TEARS OP OINT
TOPICAL_OINTMENT | OPHTHALMIC | Status: DC | PRN
  Administered 2014-11-27: 1 via OPHTHALMIC

## 2014-11-27 MED ORDER — ROCURONIUM BROMIDE 50 MG/5ML IV SOLN
INTRAVENOUS | Status: AC
  Filled 2014-11-27: qty 2

## 2014-11-27 MED ORDER — SODIUM CHLORIDE 0.9 % IJ SOLN: 9.0000 mL | INTRAMUSCULAR | Status: DC | PRN

## 2014-11-27 MED ORDER — FENTANYL 10 MCG/ML IV SOLN
INTRAVENOUS | Status: DC
  Administered 2014-11-27: 9 ug/h via INTRAVENOUS
  Administered 2014-11-27: 15 ug via INTRAVENOUS
  Administered 2014-11-28: 45 ug via INTRAVENOUS
  Administered 2014-11-28: 30 ug/h via INTRAVENOUS
  Administered 2014-11-28: 120 ug via INTRAVENOUS
  Administered 2014-11-28 (×2): 30 ug via INTRAVENOUS
  Administered 2014-11-28: 30 ug/h via INTRAVENOUS
  Administered 2014-11-28: 45 ug via INTRAVENOUS
  Administered 2014-11-29: 30 ug via INTRAVENOUS
  Administered 2014-11-29: 60 ug/h via INTRAVENOUS
  Administered 2014-11-29: 75 ug via INTRAVENOUS
  Filled 2014-11-27 (×2): qty 50

## 2014-11-27 MED ORDER — HYDROMORPHONE HCL 1 MG/ML IJ SOLN
0.2500 mg | INTRAMUSCULAR | Status: DC | PRN
  Administered 2014-11-27 (×2): 0.25 mg via INTRAVENOUS

## 2014-11-27 MED ORDER — SODIUM CHLORIDE 0.9 % IV SOLN
Freq: Once | INTRAVENOUS | Status: AC
  Administered 2014-11-27: 500 mL via INTRAVENOUS

## 2014-11-27 MED ORDER — MIDAZOLAM HCL 2 MG/2ML IJ SOLN
INTRAMUSCULAR | Status: AC
  Filled 2014-11-27: qty 2

## 2014-11-27 MED ORDER — HEMOSTATIC AGENTS (NO CHARGE) OPTIME
TOPICAL | Status: DC | PRN
  Administered 2014-11-27: 2 via TOPICAL

## 2014-11-27 MED ORDER — LIDOCAINE HCL (CARDIAC) 20 MG/ML IV SOLN
INTRAVENOUS | Status: DC | PRN
  Administered 2014-11-27: 40 mg via INTRAVENOUS

## 2014-11-27 MED ORDER — PROPOFOL 10 MG/ML IV BOLUS
INTRAVENOUS | Status: DC | PRN
  Administered 2014-11-27: 100 mg via INTRAVENOUS

## 2014-11-27 MED ORDER — SODIUM CHLORIDE 0.9 % IR SOLN
Status: DC | PRN
  Administered 2014-11-27: 1000 mL

## 2014-11-27 MED ORDER — NEOSTIGMINE METHYLSULFATE 10 MG/10ML IV SOLN
INTRAVENOUS | Status: AC
  Filled 2014-11-27: qty 1

## 2014-11-27 SURGICAL SUPPLY — 101 items
APPLIER CLIP 5 13 M/L LIGAMAX5 (MISCELLANEOUS)
APR CLP MED LRG 5 ANG JAW (MISCELLANEOUS)
BLADE SURG 10 STRL SS (BLADE) ×2 IMPLANT
BLADE SURG ROTATE 9660 (MISCELLANEOUS) IMPLANT
BNDG GAUZE ELAST 4 BULKY (GAUZE/BANDAGES/DRESSINGS) ×2 IMPLANT
BRR ADH 5X3 SEPRAFILM 6 SHT (MISCELLANEOUS) ×1
CANISTER SUCTION 2500CC (MISCELLANEOUS) ×2 IMPLANT
CELLS DAT CNTRL 66122 CELL SVR (MISCELLANEOUS) IMPLANT
CHLORAPREP W/TINT 26ML (MISCELLANEOUS) ×3 IMPLANT
CLIP APPLIE 5 13 M/L LIGAMAX5 (MISCELLANEOUS) IMPLANT
CLIP TI MEDIUM 6 (CLIP) IMPLANT
COVER MAYO STAND STRL (DRAPES) ×3 IMPLANT
COVER SURGICAL LIGHT HANDLE (MISCELLANEOUS) ×5 IMPLANT
DRAIN CHANNEL 19F RND (DRAIN) ×2 IMPLANT
DRAPE LAPAROSCOPIC ABDOMINAL (DRAPES) ×3 IMPLANT
DRAPE PROXIMA HALF (DRAPES) ×1 IMPLANT
DRAPE WARM FLUID 44X44 (DRAPE) ×3 IMPLANT
DRSG OPSITE POSTOP 4X10 (GAUZE/BANDAGES/DRESSINGS) IMPLANT
DRSG OPSITE POSTOP 4X8 (GAUZE/BANDAGES/DRESSINGS) IMPLANT
DRSG TEGADERM 2-3/8X2-3/4 SM (GAUZE/BANDAGES/DRESSINGS) ×4 IMPLANT
ELECT BLADE 6.5 EXT (BLADE) ×3 IMPLANT
ELECT CAUTERY BLADE 6.4 (BLADE) ×4 IMPLANT
ELECT REM PT RETURN 9FT ADLT (ELECTROSURGICAL) ×3
ELECTRODE REM PT RTRN 9FT ADLT (ELECTROSURGICAL) ×1 IMPLANT
EVACUATOR SILICONE 100CC (DRAIN) ×2 IMPLANT
GAUZE SPONGE 4X4 12PLY STRL (GAUZE/BANDAGES/DRESSINGS) ×3 IMPLANT
GEL ULTRASOUND 20GR AQUASONIC (MISCELLANEOUS) IMPLANT
GLOVE BIO SURGEON STRL SZ8 (GLOVE) ×6 IMPLANT
GLOVE BIOGEL PI IND STRL 7.0 (GLOVE) IMPLANT
GLOVE BIOGEL PI IND STRL 7.5 (GLOVE) IMPLANT
GLOVE BIOGEL PI IND STRL 8 (GLOVE) ×2 IMPLANT
GLOVE BIOGEL PI INDICATOR 7.0 (GLOVE) ×4
GLOVE BIOGEL PI INDICATOR 7.5 (GLOVE) ×2
GLOVE BIOGEL PI INDICATOR 8 (GLOVE) ×4
GLOVE SURG SS PI 7.0 STRL IVOR (GLOVE) ×2 IMPLANT
GOWN STRL REUS W/ TWL LRG LVL3 (GOWN DISPOSABLE) ×6 IMPLANT
GOWN STRL REUS W/ TWL XL LVL3 (GOWN DISPOSABLE) ×2 IMPLANT
GOWN STRL REUS W/TWL LRG LVL3 (GOWN DISPOSABLE) ×9
GOWN STRL REUS W/TWL XL LVL3 (GOWN DISPOSABLE)
HEMOSTAT SNOW SURGICEL 2X4 (HEMOSTASIS) ×4 IMPLANT
KIT BASIN OR (CUSTOM PROCEDURE TRAY) ×3 IMPLANT
KIT OSTOMY DRAINABLE 2.75 STR (WOUND CARE) ×2 IMPLANT
KIT ROOM TURNOVER OR (KITS) ×3 IMPLANT
LIGASURE IMPACT 36 18CM CVD LR (INSTRUMENTS) ×2 IMPLANT
LIGASURE SHORT ATLAS 20CM (ELECTROSURGICAL) ×1 IMPLANT
NS IRRIG 1000ML POUR BTL (IV SOLUTION) ×6 IMPLANT
PACK GENERAL/GYN (CUSTOM PROCEDURE TRAY) ×3 IMPLANT
PAD ARMBOARD 7.5X6 YLW CONV (MISCELLANEOUS) ×4 IMPLANT
PENCIL BUTTON HOLSTER BLD 10FT (ELECTRODE) ×6 IMPLANT
RELOAD PROXIMATE 75MM BLUE (ENDOMECHANICALS) ×12 IMPLANT
RELOAD STAPLE 75 3.8 BLU REG (ENDOMECHANICALS) IMPLANT
RETRACTOR WND ALEXIS 18 MED (MISCELLANEOUS) IMPLANT
RTRCTR WOUND ALEXIS 18CM MED (MISCELLANEOUS)
SCALPEL HARMONIC ACE (MISCELLANEOUS) ×1 IMPLANT
SCISSORS LAP 5X35 DISP (ENDOMECHANICALS) ×3 IMPLANT
SEPRAFILM PROCEDURAL PACK 3X5 (MISCELLANEOUS) ×2 IMPLANT
SET IRRIG TUBING LAPAROSCOPIC (IRRIGATION / IRRIGATOR) ×2 IMPLANT
SLEEVE ENDOPATH XCEL 5M (ENDOMECHANICALS) ×5 IMPLANT
SPECIMEN JAR X LARGE (MISCELLANEOUS) ×3 IMPLANT
SPONGE GAUZE 4X4 12PLY STER LF (GAUZE/BANDAGES/DRESSINGS) ×2 IMPLANT
SPONGE LAP 18X18 X RAY DECT (DISPOSABLE) ×9 IMPLANT
STAPLER CUT CVD 40MM BLUE (STAPLE) ×2 IMPLANT
STAPLER GUN LINEAR PROX 60 (STAPLE) ×2 IMPLANT
STAPLER PROXIMATE 75MM BLUE (STAPLE) ×2 IMPLANT
STAPLER VISISTAT 35W (STAPLE) ×3 IMPLANT
SUCTION POOLE TIP (SUCTIONS) ×3 IMPLANT
SUT ETHILON 2 0 FS 18 (SUTURE) ×2 IMPLANT
SUT MON AB 3-0 SH 27 (SUTURE)
SUT MON AB 3-0 SH27 (SUTURE) IMPLANT
SUT PDS AB 1 CTX 36 (SUTURE) ×2 IMPLANT
SUT PROLENE 2 0 CT2 30 (SUTURE) ×2 IMPLANT
SUT PROLENE 2 0 SH DA (SUTURE) IMPLANT
SUT SILK 2 0 TIES 10X30 (SUTURE) IMPLANT
SUT SILK 3 0 SH CR/8 (SUTURE) ×2 IMPLANT
SUT SILK 3 0 TIES 10X30 (SUTURE) IMPLANT
SUT VIC AB 2-0 SH 18 (SUTURE) ×3 IMPLANT
SUT VIC AB 3-0 54X BRD REEL (SUTURE) ×1 IMPLANT
SUT VIC AB 3-0 BRD 54 (SUTURE) ×3
SUT VIC AB 3-0 SH 18 (SUTURE) ×7 IMPLANT
SUT VICRYL AB 2 0 TIES (SUTURE) ×3 IMPLANT
SUT VICRYL AB 3 0 TIES (SUTURE) ×3 IMPLANT
SYR BULB IRRIGATION 50ML (SYRINGE) ×3 IMPLANT
SYS LAPSCP GELPORT 120MM (MISCELLANEOUS)
SYSTEM LAPSCP GELPORT 120MM (MISCELLANEOUS) IMPLANT
TAPE CLOTH SURG 4X10 WHT LF (GAUZE/BANDAGES/DRESSINGS) ×2 IMPLANT
TOWEL OR 17X24 6PK STRL BLUE (TOWEL DISPOSABLE) ×1 IMPLANT
TOWEL OR 17X26 10 PK STRL BLUE (TOWEL DISPOSABLE) ×4 IMPLANT
TRAY FOLEY CATH 16FRSI W/METER (SET/KITS/TRAYS/PACK) ×3 IMPLANT
TRAY LAPAROSCOPIC (CUSTOM PROCEDURE TRAY) ×1 IMPLANT
TRAY PROCTOSCOPIC FIBER OPTIC (SET/KITS/TRAYS/PACK) ×3 IMPLANT
TROCAR XCEL 12X100 BLDLESS (ENDOMECHANICALS) IMPLANT
TROCAR XCEL BLUNT TIP 100MML (ENDOMECHANICALS) IMPLANT
TROCAR XCEL NON-BLD 11X100MML (ENDOMECHANICALS) IMPLANT
TROCAR XCEL NON-BLD 5MMX100MML (ENDOMECHANICALS) ×3 IMPLANT
TUBE CONNECTING 12'X1/4 (SUCTIONS) ×1
TUBE CONNECTING 12X1/4 (SUCTIONS) ×3 IMPLANT
TUBING FILTER THERMOFLATOR (ELECTROSURGICAL) ×3 IMPLANT
TUBING INSUFFLATION (TUBING) ×3 IMPLANT
UNDERPAD 30X30 INCONTINENT (UNDERPADS AND DIAPERS) IMPLANT
WATER STERILE IRR 1000ML POUR (IV SOLUTION) ×1 IMPLANT
YANKAUER SUCT BULB TIP NO VENT (SUCTIONS) ×4 IMPLANT

## 2014-11-27 NOTE — Progress Notes (Signed)
PARENTERAL NUTRITION CONSULT NOTE - FOLLOW UP  Pharmacy Consult:  TPN Indication:  Large bowel obstruction  Allergies  Allergen Reactions  . Bactrim [Sulfamethoxazole-Trimethoprim] Hives, Itching and Other (See Comments)    Irregular heart beat  . Codeine Palpitations    Dizziness, sweats     Patient Measurements: Height: 5\' 3"  (160 cm) Weight: 127 lb 13.9 oz (58 kg) IBW/kg (Calculated) : 52.4  Usual weight: 61 kg  Vital Signs: Temp: 98.6 F (37 C) (02/10 0602) Temp Source: Oral (02/09 2138) BP: 125/61 mmHg (02/10 0602) Pulse Rate: 79 (02/10 0602) Intake/Output from previous day: 02/09 0701 - 02/10 0700 In: 1340 [P.O.:650; TPN:680] Out: 1310 [Urine:1300; Drains:10]  Labs:  Recent Labs  11/25/14 0540  WBC 8.5  HGB 10.1*  HCT 31.6*  PLT 295     Recent Labs  11/25/14 0540 11/26/14 0548 11/27/14 0608  NA 132* 132* 131*  K 3.7 3.8 3.9  CL 101 99 101  CO2 28 24 22   GLUCOSE 131* 87 102*  BUN <5* 8 10  CREATININE 0.51 0.44* 0.48*  CALCIUM 7.9* 8.1* 8.2*  MG 1.9 2.2 2.0  PHOS 2.4 2.7 2.9  PROT 5.4*  --   --   ALBUMIN 2.2*  --   --   AST 17  --   --   ALT 8  --   --   ALKPHOS 77  --   --   BILITOT 0.1*  --   --   PREALBUMIN 6.3*  --   --   TRIG 107  --   --    Estimated Creatinine Clearance: 50.3 mL/min (by C-G formula based on Cr of 0.48).    Recent Labs  11/26/14 1812 11/27/14 0016 11/27/14 0612  GLUCAP 108* 114* 100*     Insulin Requirements in the past 24 hours:  0 unit SSI  Admit: [name] admited after having constipation for several weeks and recent colonoscopy revealed stricture of sigmoid colon. CT revealed pericolonic abscess, now s/p percutaneous drainage of abscess.  Patient continues on TPN for nurtional support.  GI: reported "food would not stay down" for ~3 months and has had significant weight loss in the past 1 month.  Baseline prealbumin low at 6.3.  Has been having BMs and flatus - obstruction radiographically and  symptomatically improving >> Hartmann resection on 2/10.  On IV PPI Endo: no history DM - CBGs controlled Lytes: mild hyponatremia, others WNL Renal: SCr stable, CrCL 50 ml/min - good UOP 0.9 ml/kg/hr, NS at 20 ml/hr Pulm: COPD - stable on RA - Brovana, Pulmicort Cards: HTN / aortic stenosis - VSS Hepatobil: LFTs / tbili / TG WNL Neuro: no acute issues - received PRN morphine 2/9 ID: Cipro/Flagyl D#6 - afebrile, WBC WNL, abscess cx pending Best Practices: Lovenox held for surgery TPN Access: right basilic double lumen PICC TPN day#: 3 (2/7 >> )  Current Nutrition:  TPN  Nutritional Goals:  1680-1880 kCal, 75-90 grams of protein per day   Plan:  - Continue Clinimix E 5/15 at goal rate of 75 ml/hr + 20% IVFE at 10 ml/hr.  TPN provides 1758 kCal and 90gm of protein daily, meeting 100% of patient's needs - Daily multivitamin and trace elements - Continue sensitive SSI post-operatively - F/U AM labs    Evelynne Spiers D. Laney Potashang, PharmD, BCPS Pager:  [phone] 11/27/2014, 8:54 AM

## 2014-11-27 NOTE — Progress Notes (Signed)
Left TG drain no longer needed patient post op and drain has pulled back into soft tissue. Successful removal of drain with no immediate complication, dressing placed.   Pattricia BossKoreen Adell Koval PA-C Interventional Radiology  11/27/14  4:48 PM

## 2014-11-27 NOTE — Anesthesia Postprocedure Evaluation (Signed)
Anesthesia Post Note  Patient: April Hull  Procedure(s) Performed: Procedure(s) (LRB): LAPAROSCOPIC ASSISTED SIGMOID COLECTOMY (N/A) COLOSTOMY (N/A)  Anesthesia type: General  Patient location: PACU  Post pain: Pain level controlled  Post assessment: Post-op Vital signs reviewed  Last Vitals: BP 133/74 mmHg  Pulse 86  Temp(Src) 36.4 C (Oral)  Resp 29  Ht 5\' 3"  (1.6 m)  Wt 127 lb 13.9 oz (58 kg)  BMI 22.66 kg/m2  SpO2 99%  Post vital signs: Reviewed  Level of consciousness: sedated  PACU course: Mild hypotension, responded to 500ml NS bolus. VSS  Complications: No apparent anesthesia complications

## 2014-11-27 NOTE — Anesthesia Procedure Notes (Signed)
Procedure Name: Intubation Date/Time: 11/27/2014 8:39 AM Performed by: Carmela RimaMARTINELLI, Dorian Renfro F Pre-anesthesia Checklist: Patient being monitored, Suction available, Emergency Drugs available, Patient identified and Timeout performed Patient Re-evaluated:Patient Re-evaluated prior to inductionOxygen Delivery Method: Circle system utilized Preoxygenation: Pre-oxygenation with 100% oxygen Intubation Type: IV induction and Rapid sequence Ventilation: Mask ventilation without difficulty Laryngoscope Size: Mac and 3 Grade View: Grade I Tube type: Oral Tube size: 7.0 mm Number of attempts: 1 Placement Confirmation: positive ETCO2,  ETT inserted through vocal cords under direct vision and breath sounds checked- equal and bilateral Secured at: 21 cm Tube secured with: Tape Dental Injury: Teeth and Oropharynx as per pre-operative assessment

## 2014-11-27 NOTE — Interval H&P Note (Signed)
History and Physical Interval Note:  11/27/2014 8:07 AM  April RampShelby J Hull  has presented today for surgery, with the diagnosis of Large Bowel Obstruction  The various methods of treatment have been discussed with the patient and family. After consideration of risks, benefits and other options for treatment, the patient has consented to  Procedure(s): LAPAROSCOPIC SIGMOID COLECTOMY (N/A) COLOSTOMY (N/A) as a surgical intervention .  The patient's history has been reviewed, patient examined, no change in status, stable for surgery.  I have reviewed the patient's chart and labs.  Questions were answered to the patient's satisfaction.     Izola Teague A.

## 2014-11-27 NOTE — Transfer of Care (Signed)
Immediate Anesthesia Transfer of Care Note  Patient: April Hull  Procedure(s) Performed: Procedure(s): LAPAROSCOPIC ASSISTED SIGMOID COLECTOMY (N/A) COLOSTOMY (N/A)  Patient Location: PACU  Anesthesia Type:General  Level of Consciousness: awake, alert  and oriented  Airway & Oxygen Therapy: Patient Spontanous Breathing and Patient connected to nasal cannula oxygen  Post-op Assessment: Report given to RN, Post -op Vital signs reviewed and stable and Patient moving all extremities X 4  Post vital signs: Reviewed and stable  Last Vitals:  Filed Vitals:   11/27/14 0602  BP: 125/61  Pulse: 79  Temp: 37 C  Resp: 17    Complications: No apparent anesthesia complications

## 2014-11-27 NOTE — Brief Op Note (Signed)
11/22/2014 - 11/27/2014  11:49 AM  PATIENT:  Nestor RampShelby J Sulser  74 y.o. female  PRE-OPERATIVE DIAGNOSIS:  Large Bowel Obstruction  POST-OPERATIVE DIAGNOSIS:  Large Bowel Obstruction  PROCEDURE:  Procedure(s): LAPAROSCOPIC SIGMOID COLECTOMY (N/A) COLOSTOMY (N/A)  SURGEON:  Surgeon(s) and Role:    Harriette Bouillon* Shardea Cwynar, MD - Primary      ANESTHESIA:   general  EBL:  Total I/O In: 1000 [I.V.:1000] Out: 725 [Urine:525; Blood:200]  BLOOD ADMINISTERED:none  DRAINS: (19) Jackson-Pratt drain(s) with closed bulb suction in the pelvis   LOCAL MEDICATIONS USED:  BUPIVICAINE   SPECIMEN:  Source of Specimen:  sigmoid colon proximal rectum  DISPOSITION OF SPECIMEN:  PATHOLOGY  COUNTS:  YES  TOURNIQUET:  * No tourniquets in log *  DICTATION: .Other Dictation: Dictation Number   [id number]  PLAN OF CARE: Admit to inpatient   PATIENT DISPOSITION:  PACU - hemodynamically stable.   Delay start of Pharmacological VTE agent (>24hrs) due to surgical blood loss or risk of bleeding: yes

## 2014-11-27 NOTE — Anesthesia Preprocedure Evaluation (Addendum)
Anesthesia Evaluation  Patient identified by MRN, date of birth, ID band Patient awake    Reviewed: Allergy & Precautions, H&P , NPO status , Patient's Chart, lab work & pertinent test results  Airway Mallampati: II  TM Distance: <3 FB Neck ROM: full    Dental no notable dental hx. (+) Edentulous Upper, Edentulous Lower, Dental Advidsory Given   Pulmonary COPD COPD inhaler, Current Smoker,  breath sounds clear to auscultation  Pulmonary exam normal       Cardiovascular hypertension, Pt. on medications + Valvular Problems/Murmurs AS Rhythm:Regular Rate:Normal + Systolic murmurs    Neuro/Psych negative neurological ROS  negative psych ROS   GI/Hepatic Neg liver ROS, GERD-  Medicated and Controlled,  Endo/Other  negative endocrine ROS  Renal/GU negative Renal ROS  negative genitourinary   Musculoskeletal  (+) Arthritis -, Osteoarthritis,    Abdominal   Peds  Hematology negative hematology ROS (+)   Anesthesia Other Findings   Reproductive/Obstetrics negative OB ROS                           Anesthesia Physical Anesthesia Plan  ASA: III  Anesthesia Plan: General   Post-op Pain Management:    Induction: Intravenous  Airway Management Planned: Oral ETT  Additional Equipment: Arterial line  Intra-op Plan:   Post-operative Plan: Extubation in OR  Informed Consent: I have reviewed the patients History and Physical, chart, labs and discussed the procedure including the risks, benefits and alternatives for the proposed anesthesia with the patient or authorized representative who has indicated his/her understanding and acceptance.   Dental advisory given and Dental Advisory Given  Plan Discussed with: CRNA  Anesthesia Plan Comments:        Anesthesia Quick Evaluation

## 2014-11-27 NOTE — H&P (View-Only) (Signed)
  Subjective: Stable and alert. Minimal pain. Tolerating clear liquid diet. Passing flatus but no stool. Remains on TNA. Remains on IV Cipro and Flagyl. Drain output 35 mL in 24 hours. Afebrile.  Discussed surgical care with patient and Dr. Luisa Hartornett, who plans Hartmenn resection tomorrow.  Vital signs in last 24 hours: Temp:  [97.7 F (36.5 C)-98.5 F (36.9 C)] 97.7 F (36.5 C) (02/09 0555) Pulse Rate:  [78-84] 78 (02/09 0555) Resp:  [16] 16 (02/09 0555) BP: (118-131)/(60-97) 118/60 mmHg (02/09 0555) SpO2:  [93 %-96 %] 93 % (02/09 0555) Last BM Date: 11/22/14  Intake/Output from previous day: 02/08 0701 - 02/09 0700 In: 1302 [I.V.:552; IV Piggyback:250; TPN:480] Out: 335 [Urine:300; Drains:35] Intake/Output this shift:    General appearance: Alert. No distress. Oriented and cooperative. Resp: clear to auscultation bilaterally GI: Soft. Nondistended. Mild lower abdominal tenderness. No mass.  Lab Results:   Recent Labs  11/24/14 0500 11/25/14 0540  WBC 8.9 8.5  HGB 9.9* 10.1*  HCT 30.8* 31.6*  PLT 327 295   BMET  Recent Labs  11/25/14 0540 11/26/14 0548  NA 132* 132*  K 3.7 3.8  CL 101 99  CO2 28 24  GLUCOSE 131* 87  BUN <5* 8  CREATININE 0.51 0.44*  CALCIUM 7.9* 8.1*   PT/INR  Recent Labs  11/23/14 1148  LABPROT 14.0  INR 1.07   ABG No results for input(s): PHART, HCO3 in the last 72 hours.  Invalid input(s): PCO2, PO2  Studies/Results: No results found.  Anti-infectives: Anti-infectives    Start     Dose/Rate Route Frequency Ordered Stop   11/22/14 1500  ciprofloxacin (CIPRO) IVPB 400 mg     400 mg200 mL/hr over 60 Minutes Intravenous Every 12 hours 11/22/14 1447     11/22/14 1500  metroNIDAZOLE (FLAGYL) IVPB 500 mg     500 mg100 mL/hr over 60 Minutes Intravenous Every 8 hours 11/22/14 1447        Assessment/Plan:  Sigmoid diverticulitis with stricture, pericolonic abscess, and partial colonic obstruction area  symptomatically  the obstruction is improving. Radiographically appears to be much less as well.  A tight stricture, nevertheless. Allow clear liquids. Continue Cipro and Flagyl.  Continue TNA  Dr[facility] Harri[facility] He is going to discuss this with the patient again today.  Allow Lovenox today, no Lovenox tomorrow.  Successful percutaneous drainage of pericolonic abscess  Continue Cipro and Flagyl  Aortic stenosis Tobacco abuse and mild COPD.    LOS: 4 days    April Hull 11/26/2014

## 2014-11-28 LAB — GLUCOSE, CAPILLARY
GLUCOSE-CAPILLARY: 134 mg/dL — AB (ref 70–99)
GLUCOSE-CAPILLARY: 129 mg/dL — AB (ref 70–99)
Glucose-Capillary: 122 mg/dL — ABNORMAL HIGH (ref 70–99)
Glucose-Capillary: 124 mg/dL — ABNORMAL HIGH (ref 70–99)

## 2014-11-28 LAB — COMPREHENSIVE METABOLIC PANEL
ALBUMIN: 1.8 g/dL — AB (ref 3.5–5.2)
ALT: 7 U/L (ref 0–35)
ANION GAP: 3 — AB (ref 5–15)
AST: 19 U/L (ref 0–37)
Alkaline Phosphatase: 55 U/L (ref 39–117)
BILIRUBIN TOTAL: 0.3 mg/dL (ref 0.3–1.2)
BUN: 10 mg/dL (ref 6–23)
CALCIUM: 7.4 mg/dL — AB (ref 8.4–10.5)
CO2: 26 mmol/L (ref 19–32)
CREATININE: 0.39 mg/dL — AB (ref 0.50–1.10)
Chloride: 101 mmol/L (ref 96–112)
GFR calc Af Amer: >90 mL/min (ref >90)
GFR calc non Af Amer: >90 mL/min (ref >90)
Glucose, Bld: 121 mg/dL — ABNORMAL HIGH (ref 70–99)
Potassium: 3.5 mmol/L (ref 3.5–5.1)
Sodium: 130 mmol/L — ABNORMAL LOW (ref 135–145)
Total Protein: 4.2 g/dL — ABNORMAL LOW (ref 6.0–8.3)

## 2014-11-28 LAB — MAGNESIUM: Magnesium: 1.7 mg/dL (ref 1.5–2.5)

## 2014-11-28 LAB — CULTURE, ROUTINE-ABSCESS
CULTURE: NO GROWTH
GRAM STAIN: NONE SEEN

## 2014-11-28 LAB — PHOSPHORUS: PHOSPHORUS: 2 mg/dL — AB (ref 2.3–4.6)

## 2014-11-28 MED ORDER — TRACE MINERALS CR-CU-F-FE-I-MN-MO-SE-ZN IV SOLN
INTRAVENOUS | Status: AC
  Administered 2014-11-28: 18:00:00 via INTRAVENOUS
  Filled 2014-11-28: qty 1800

## 2014-11-28 MED ORDER — POTASSIUM CHLORIDE 10 MEQ/50ML IV SOLN
10.0000 meq | INTRAVENOUS | Status: AC
  Administered 2014-11-28 (×4): 10 meq via INTRAVENOUS
  Filled 2014-11-28 (×4): qty 50

## 2014-11-28 MED ORDER — MAGNESIUM SULFATE 2 GM/50ML IV SOLN
2.0000 g | Freq: Once | INTRAVENOUS | Status: AC
  Administered 2014-11-28: 2 g via INTRAVENOUS
  Filled 2014-11-28: qty 50

## 2014-11-28 MED ORDER — LEVOTHYROXINE SODIUM 100 MCG IV SOLR
50.0000 ug | Freq: Every day | INTRAVENOUS | Status: DC
  Administered 2014-11-28: 50 ug via INTRAVENOUS
  Filled 2014-11-28 (×3): qty 5

## 2014-11-28 MED ORDER — FAT EMULSION 20 % IV EMUL
240.0000 mL | INTRAVENOUS | Status: AC
  Administered 2014-11-28: 240 mL via INTRAVENOUS
  Filled 2014-11-28: qty 250

## 2014-11-28 MED ORDER — SODIUM PHOSPHATE 3 MMOLE/ML IV SOLN
20.0000 mmol | Freq: Once | INTRAVENOUS | Status: AC
  Administered 2014-11-28: 20 mmol via INTRAVENOUS
  Filled 2014-11-28: qty 6.67

## 2014-11-28 NOTE — Progress Notes (Signed)
No post op pulm complications noted PCCM will sign off. Please call if we can be of further assistance  Billy Fischeravid Collene Massimino, MD ; [facility] Mobile [phone].  After 5:30 PM or weekends, call [phone]

## 2014-11-28 NOTE — Progress Notes (Signed)
PARENTERAL NUTRITION CONSULT NOTE - FOLLOW UP  Pharmacy Consult:  TPN Indication:  Large bowel obstruction  Allergies  Allergen Reactions  . Bactrim [Sulfamethoxazole-Trimethoprim] Hives, Itching and Other (See Comments)    Irregular heart beat  . Codeine Palpitations    Dizziness, sweats     Patient Measurements: Height: 5\' 3"  (160 cm) Weight: 127 lb 13.9 oz (58 kg) IBW/kg (Calculated) : 52.4  Usual weight: 61 kg  Vital Signs: Temp: 97.3 F (36.3 C) (02/11 0553) Temp Source: Oral (02/11 0553) BP: 114/52 mmHg (02/11 0553) Pulse Rate: 91 (02/11 0553) Intake/Output from previous day: 02/10 0701 - 02/11 0700 In: 4094.3 [I.V.:3414.3; TPN:680] Out: 1640 [Urine:1325; Drains:115; Blood:200]  Labs: No results for input(s): WBC, HGB, HCT, PLT, APTT, INR in the last 72 hours.   Recent Labs  11/26/14 0548 11/27/14 0608 11/28/14 0520  NA 132* 131* 130*  K 3.8 3.9 3.5  CL 99 101 101  CO2 24 22 26   GLUCOSE 87 102* 121*  BUN 8 10 10   CREATININE 0.44* 0.48* 0.39*  CALCIUM 8.1* 8.2* 7.4*  MG 2.2 2.0 1.7  PHOS 2.7 2.9 2.0*  PROT  --   --  4.2*  ALBUMIN  --   --  1.8*  AST  --   --  19  ALT  --   --  7  ALKPHOS  --   --  55  BILITOT  --   --  0.3   Estimated Creatinine Clearance: 50.3 mL/min (by C-G formula based on Cr of 0.39).    Recent Labs  11/27/14 1738 11/27/14 2330 11/28/14 0550  GLUCAP 246* 179* 134*     Insulin Requirements in the past 24 hours:  3 units SSI  Admit:  [name] admited after having constipation for several weeks and recent colonoscopy revealed stricture of sigmoid colon. CT revealed pericolonic abscess, now s/p percutaneous drainage of abscess.  Patient continues on TPN for nurtional support.  GI: reported "food would not stay down" for ~3 months and has had significant weight loss in the past 1 month.  Baseline prealbumin low at 6.3.  Obstruction radiographically >> s/p sigmoid colectomy with colostomy, SBR of distal ileum, drainage of  pelvic abscess on 2/10.  On IV PPI Endo: no history DM - CBGs mostly controlled (?elevated values related to stress from surgery) Lytes: mild hyponatremia, K+ and Mag low normal, Phos low at 2 Renal: SCr stable, CrCL 50 ml/min - good UOP 1 ml/kg/hr, NS at 20 ml/hr Pulm: COPD - RA >> 2L Marks - Brovana, Pulmicort Cards: HTN / aortic stenosis - BP controlled, tachy Hepatobil: LFTs / tbili / TG WNL Neuro: started Fentanyl PCA post-op + PRN Dilaudid, pain score 1-2 ID: Cipro/Flagyl D#7 for intra-abd infxn - afebrile, WBC WNL, abscess cx pending Best Practices: Lovenox held for surgery TPN Access: right basilic double lumen PICC TPN day#: 4 (2/7 >> )  Current Nutrition:  TPN  Nutritional Goals:  1680-1880 kCal, 75-90 grams of protein per day   Plan:  - Continue Clinimix E 5/15 at goal rate of 75 ml/hr + 20% IVFE at 10 ml/hr.  TPN provides 1758 kCal and 90gm of protein daily, meeting 100% of patient's needs - Daily multivitamin and trace elements - Continue sensitive SSI post-operatively - KCL x 4 runs - NaPhos 20 mmol IV x 1 - Mag sulfate 2gm IV x 1 - F/U AM labs    Zykeriah Mathia D. Laney Potashang, PharmD, BCPS Pager:  [phone] 11/28/2014, 8:00 AM

## 2014-11-28 NOTE — Progress Notes (Signed)
Patient ID: Jake BatheShelby J Hipwell, female   DOB: 1941/09/28, 74 y.o.   MRN: [id number] 1 Day Post-Op  Subjective: Pt having some pain today, but otherwise stable  Objective: Vital signs in last 24 hours: Temp:  [97.3 F (36.3 C)-99 F (37.2 C)] 97.3 F (36.3 C) (02/11 0553) Pulse Rate:  [76-110] 91 (02/11 0553) Resp:  [16-29] 26 (02/11 0857) BP: (85-139)/(44-75) 114/52 mmHg (02/11 0553) SpO2:  [93 %-100 %] 93 % (02/11 0857) Arterial Line BP: (76-145)/(38-66) 143/63 mmHg (02/10 1423) Last BM Date: 11/22/14  Intake/Output from previous day: 02/10 0701 - 02/11 0700 In: 4094.3 [I.V.:3414.3; TPN:680] Out: 1640 [Urine:1325; Drains:115; Blood:200] Intake/Output this shift:    PE: Abd: soft, appropriately tender, absent BS, minimal NGT output, ostomy is clean and pink but no output Heart: regular Lungs: CTAB  Lab Results:  No results for input(s): WBC, HGB, HCT, PLT in the last 72 hours. BMET  Recent Labs  11/27/14 0608 11/28/14 0520  NA 131* 130*  K 3.9 3.5  CL 101 101  CO2 22 26  GLUCOSE 102* 121*  BUN 10 10  CREATININE 0.48* 0.39*  CALCIUM 8.2* 7.4*   PT/INR No results for input(s): LABPROT, INR in the last 72 hours. CMP     Component Value Date/Time   NA 130* 11/28/2014 0520   K 3.5 11/28/2014 0520   CL 101 11/28/2014 0520   CO2 26 11/28/2014 0520   GLUCOSE 121* 11/28/2014 0520   BUN 10 11/28/2014 0520   CREATININE 0.39* 11/28/2014 0520   CALCIUM 7.4* 11/28/2014 0520   PROT 4.2* 11/28/2014 0520   ALBUMIN 1.8* 11/28/2014 0520   AST 19 11/28/2014 0520   ALT 7 11/28/2014 0520   ALKPHOS 55 11/28/2014 0520   BILITOT 0.3 11/28/2014 0520   GFRNONAA >90 11/28/2014 0520   GFRAA >90 11/28/2014 0520   Lipase  No results found for: LIPASE     Studies/Results: No results found.  Anti-infectives: Anti-infectives    Start     Dose/Rate Route Frequency Ordered Stop   11/27/14 1100  cefOXitin (MEFOXIN) 2 g in dextrose 5 % 50 mL IVPB  Status:  Discontinued     2  g 100 mL/hr over 30 Minutes Intravenous To Surgery 11/27/14 1106 11/27/14 1446   11/27/14 0600  cefOXitin (MEFOXIN) 2 g in dextrose 5 % 50 mL IVPB     2 g 100 mL/hr over 30 Minutes Intravenous On call to O.R. 11/26/14 1357 11/27/14 0850   11/22/14 1500  ciprofloxacin (CIPRO) IVPB 400 mg     400 mg 200 mL/hr over 60 Minutes Intravenous Every 12 hours 11/22/14 1447     11/22/14 1500  metroNIDAZOLE (FLAGYL) IVPB 500 mg     500 mg 100 mL/hr over 60 Minutes Intravenous Every 8 hours 11/22/14 1447         Assessment/Plan  1. POD 1, s/p Hartman's procedure for sigmoid diverticulitis with pelvic abscess   Plan: 1. Will cont NPO and NGT today.  Await bowel function 2. Order PT, mobilize 3. Leave foley in place today, take it out tomorrow 4. Cont PCA 5. Cont Cipro and Flagyl, likely for 7 days post op  LOS: 6 days    Eduin Friedel E 11/28/2014, 9:30 AM Pager: [phone]

## 2014-11-28 NOTE — Evaluation (Signed)
Physical Therapy Evaluation Patient Details Name: April Hull MRN: [id number] DOB: April 13, 1941 Today's Date: 11/28/2014   History of Present Illness  74 y/o female presents with large bowel obstruction and s/p LAPAROSCOPIC SIGMOID COLECTOMY with COLOSTOMY   Clinical Impression  Pt presents with the below listed impairments and will benefit from skilled PT services to address these impairments and increase functional independence with mobility. Pt limited on eval due to pain and symptoms of nausea. Will benefit from gait trial in future session. Focused on OOB transfer and upright tolerance to increase overall endurance. D/c plan per pt and daughter is to go to short-term SNF for rehab due to pt living alone and family unable to provide support.     Follow Up Recommendations SNF;Supervision/Assistance - 24 hour    Equipment Recommendations  Other (comment) (TBD)    Recommendations for Other Services OT consult     Precautions / Restrictions Precautions Precautions: Fall Precaution Comments: multiple lines and leads, NGT, drain, foley, O2 Restrictions Weight Bearing Restrictions: No      Mobility  Bed Mobility Overal bed mobility: Needs Assistance Bed Mobility: Supine to Sit     Supine to sit: Min assist     General bed mobility comments: cues for technique and min A to raise trunk into sitting position. Symptoms of nausea and slight lightheadedness which resolved upon initial supine to sit  Transfers Overall transfer level: Needs assistance Equipment used: None Transfers: Stand Pivot Transfers;Sit to/from Stand Sit to Stand: Min assist Stand pivot transfers: Min guard       General transfer comment: Cues for hand placement, technique and upright posture. +2 for management of lines/leads/tubing during transfer.  Ambulation/Gait             General Gait Details: Unable to assess on eval due to nausea. Next session with PT just notify RN to remove from low wall  suction for gait.  Stairs            Wheelchair Mobility    Modified Rankin (Stroke Patients Only)       Balance Overall balance assessment: Needs assistance Sitting-balance support: Feet supported;Single extremity supported Sitting balance-Leahy Scale: Fair     Standing balance support: Bilateral upper extremity supported Standing balance-Leahy Scale: Poor                               Pertinent Vitals/Pain Pain Assessment: 0-10 Pain Score: 3  Pain Location: abdomen Pain Descriptors / Indicators: Sore Pain Intervention(s): Monitored during session;Limited activity within patient's tolerance;Premedicated before session    Home Living Family/patient expects to be discharged to:: Skilled nursing facility Living Arrangements: Alone               Additional Comments: Pt lives alone and daughter works full time (lives an hour away) and son lives out of state. Daughter reports they would like to short term SNF for her rehab until safe to return home independently.    Prior Function Level of Independence: Independent         Comments: Pt living alone and independent without AD PTA.     Hand Dominance        Extremity/Trunk Assessment   Upper Extremity Assessment: Generalized weakness           Lower Extremity Assessment: Generalized weakness      Cervical / Trunk Assessment: Kyphotic  Communication   Communication: No difficulties  Cognition Arousal/Alertness: Awake/alert Behavior  During Therapy: WFL for tasks assessed/performed Overall Cognitive Status: Within Functional Limits for tasks assessed                      General Comments      Exercises        Assessment/Plan    PT Assessment Patient needs continued PT services  PT Diagnosis Difficulty walking;Generalized weakness;Acute pain   PT Problem List Decreased strength;Decreased activity tolerance;Decreased balance;Decreased mobility;Decreased knowledge of  use of DME;Cardiopulmonary status limiting activity;Pain  PT Treatment Interventions DME instruction;Gait training;Stair training;Functional mobility training;Therapeutic activities;Therapeutic exercise;Balance training;Patient/family education;Neuromuscular re-education   PT Goals (Current goals can be found in the Care Plan section) Acute Rehab PT Goals Patient Stated Goal: none stated PT Goal Formulation: With patient/family Time For Goal Achievement: 12/12/14 Potential to Achieve Goals: Good    Frequency Min 3X/week   Barriers to discharge Decreased caregiver support Pt lives alone. Plan to d/c to SNF short-term    Co-evaluation               End of Session Equipment Utilized During Treatment: Oxygen Activity Tolerance: Patient limited by pain (and nausea) Patient left: in chair;with call bell/phone within reach;with nursing/sitter in room Nurse Communication: Mobility status         Time: 1400-1436 PT Time Calculation (min) (ACUTE ONLY): 36 min   Charges:   PT Evaluation $Initial PT Evaluation Tier I: 1 Procedure PT Treatments $Therapeutic Activity: 23-37 mins   PT G Codes:        Tedd SiasGray, Marck Mcclenny [place] 11/28/2014, 2:48 PM

## 2014-11-28 NOTE — Op Note (Signed)
April Situ:  Hull, April Hull                ACCOUNT NO.:  [id number]  MEDICAL RECORD NO.:  [id number]  LOCATION:  [id number]                        FACILITY:  [facility]  PHYSICIAN:  Maisie Fushomas A. Aubry Tucholski, M.D.DATE OF BIRTH:  [birth year]  DATE OF PROCEDURE:  11/27/2014 DATE OF DISCHARGE:                              OPERATIVE REPORT   PREOPERATIVE DIAGNOSIS:  Sigmoid diverticulitis with large bowel obstruction.  POSTOPERATIVE DIAGNOSIS:  Sigmoid diverticulitis with large bowel obstruction and pelvic abscess.  PROCEDURE: 1. Diagnostic laparoscopy. 2. Open sigmoid colectomy with colostomy 3. Takedown of splenic flexure. 4. Drainage of pelvic abscess. 5. Small bowel resection of distal ileum.  SURGEON:  Maisie Fushomas A. Stiles Maxcy, MD  ANESTHESIA:  General endotracheal anesthesia.  EBL:  200 mL.  IV FLUIDS:  2 L crystalloid.  DRAINS:  A 19 round drain to the pelvis.  SPECIMEN: 1. Sigmoid colon and proximal rectum. 2. Ileum.  INDICATIONS FOR PROCEDURE:  The patient is a 74 year old female, who has had chronic diverticulitis since October 2015.  She had been [place] medically [place] by her primary care doctor.  She has continued to have more abdominal pain, [facility] after flexible sigmoidoscopy showed obstruction of the distal sigmoid colon.  She was admitted and found to have a large pelvic abscess and this was drained.  She has continued to have a large bowel obstruction despite drainage.  We discussed operative intervention now or trying to delay it further.  Given her obstructive symptoms she has been going through, she wished to proceed and understand the need for colostomy in the setting.  I discussed procedure at great length to include but not exclusive of complications of bleeding, infection, ureteral injury, bladder injury, pelvic injury, small bowel injury, injury to blood supply to the pelvis, and lower extremities, potential injury to  nerves in this region, incisional hernia, the need for colostomy, additional surgery, death, DVT, sepsis, organ failure.  She understood all of the above and wished to proceed.  PROCEDURE IN DETAIL:  Patient was brought to operating room, after being seen in the holding area.  She was marked preoperatively by the wound ostomy care nurse.  She was placed supine on the OR table.  Anesthesia placed an A-line for closer blood pressure monitoring.  After an induction of general anesthesia, the abdomen was prepped and draped in sterile fashion.  Foley catheter was placed.  Time-out was done.  She was already on preoperative antibiotics.  She received an additional 2 g of Mefoxin.  A 5 mm port was placed after time-out in the right lower quadrant with the help of 5 mm scope.  All abdominal wall layers were visualized that was passed through.  Upon entering, she had some lower mid adhesions from previous hysterectomy.  These were taken down after placing 2 additional 5 mm ports, 1 in the right upper quadrant, the second in the right lower quadrant.  We then identified the segment of diverticulitis.  It was densely adherent to the left pelvic sidewall. We then mobilized the left colon sharply with laparoscopic scissors after the splenic flexure mobilized this down.  After doing this, I was able to then  work on trying to mobilize the sigmoid colon, but it was densely adherent and was not mobile whatsoever.  We spent about 30 to 40 minutes trying to mobilize this, but it was very dense and there was no good tissue plane.  At this point, I elected to go ahead and open and complete the procedure open.  Our laparoscopic ports were removed and passed off the field.  Lower midline incision was made and dissection was carried to the abdominal wall into the abdominal cavity.  Retractor placed.  We then began to dissect out the distal descending colon and sigmoid colon.  There was severe dense adhesive  tissues that was edematous.  I was able to dissect down into the pelvis, and this was carried down around a large pelvic abscess that had a transgluteal drain in it.  The small bowel was also stuck on this abscess cavity in the pelvis which was the ileum.  We dissected all this, it was dense and in the small bowel, she had begun to see signs of erosion of the abscess into the small bowel, was able to separate the abscess from the small bowel.  The small bowel had significant stenosis and was inflamed and it was viable to keep it.  We resected this piece of ileum which was about 15 cm from the ileocecal valve using a GIA-75 stapling device and then created a side-to-side anastomosis with the GIA-75 stapling device and a TA-60 for common enterotomy closure.  Mesentery closed with 3-0 Vicryl. This laid nicely with a good widely patent anastomosis.  I oversewed the suture line with 3-0 Vicryl.  This had no kinking or tension and laid nicely in the right lower quadrant.  After this was done, we tediously began to mobilize the sigmoid colon through all the inflammation.  I was able to mobilize it down to where the lower pelvic abscess was into this and there was still large amount of pus.  The drain had pulled back away from this, it looked like.  We washed this out and this continued all the way down into the pelvis, and was abutting the proximal rectum. Once we mobilized all this, divided the distal descending colon with a GIA-75 stapling device.  I then used the LigaSure to carefully take down the mesentery as close to the colon as I could.  We did this all the way down into the pelvis, and took actually the very proximal rectum with it.  This took some time and was very oozy and irrigation was used and cautery was used to control bleeding as well as oversew small bleeding points with 3-0 Vicryl.  I then identified the  Left ureter, it was preserved below all of this and there was no evidence  of injury to the right ureter.  We then used the Contour stapling device to transect the specimen distally and the proximal rectum and this was all removed en bloc.  This was sent to Pathology for evaluation.  Irrigation was used. The pelvis was made hemostatic.  I placed 2 marking sutures into the rectal stump of 2-0 Prolene.  There was some oozing along the left pelvic sidewall controlled with cautery and Surgicel SNoW.  I then placed Seprafilm around the stump for layer takedown of her colostomy and placed a drain in the pelvis to the right lower port site which was a 19-round [place] drain.  This was secured to the skin with 2-0 nylon. I then chose a place that was  marked for colostomy maturation.  Circular incision was made there with the help of a Kocher.  We dissected down to the anterior sheath of the rectus, opened this in the direction of its fibers and then opened the posterior sheath easily.  We reached them with a Babcock and pulled the proximal colon out and it was not twisted. We then removed all the packs we used and count these and these were correct.  NG tube was placed.  No evidence of any other small bowel issue or colonic tissue that I could see after running the small intestine.  We closed the fascia with #1 PDS single stranded in a running fashion.  We matured the colostomy with 3-0 Vicryl.  Appliance applied.  We packed the wound open.  All final counts were found to be correct, sponge, needle, and instruments. The drains were placed to bulb suction.  The patient was awoke, extubated, taken to recovery in satisfactory condition.     Trevious Rampey A. Kyleen Villatoro, M.D.     TAC/MEDQ  D:  11/27/2014  T:  11/28/2014  Job:  [id number]

## 2014-11-28 NOTE — Consult Note (Addendum)
WOC ostomy follow up Stoma type/location:  Colostomy surgery performed on 2/10 to LLQ Stomal assessment/size:  Stoma red and viable, appears to be flush with skin level. Output  No stool or flatus in pouch Ostomy pouching: 1pc. Education provided:  Pt is very sore on the first post-op day.  Pouch intact with good seal.  Will plan to perform pouch change demonstration on Friday  when pt feels better.  Supplies left at bedside for staff nurses. April Mcgeeawn Aadvika Konen MSN, RN, CWOCN, [place]CN-AP, CNS [phone]

## 2014-11-29 ENCOUNTER — Encounter (HOSPITAL_COMMUNITY): Payer: Self-pay | Admitting: Surgery

## 2014-11-29 LAB — BASIC METABOLIC PANEL
Anion gap: 6 (ref 5–15)
BUN: 10 mg/dL (ref 6–23)
CO2: 23 mmol/L (ref 19–32)
Calcium: 7.4 mg/dL — ABNORMAL LOW (ref 8.4–10.5)
Chloride: 99 mmol/L (ref 96–112)
Creatinine, Ser: 0.4 mg/dL — ABNORMAL LOW (ref 0.50–1.10)
Glucose, Bld: 114 mg/dL — ABNORMAL HIGH (ref 70–99)
POTASSIUM: 3.7 mmol/L (ref 3.5–5.1)
Sodium: 128 mmol/L — ABNORMAL LOW (ref 135–145)

## 2014-11-29 LAB — PHOSPHORUS: Phosphorus: 1.8 mg/dL — ABNORMAL LOW (ref 2.3–4.6)

## 2014-11-29 LAB — GLUCOSE, CAPILLARY
GLUCOSE-CAPILLARY: 114 mg/dL — AB (ref 70–99)
GLUCOSE-CAPILLARY: 128 mg/dL — AB (ref 70–99)
Glucose-Capillary: 124 mg/dL — ABNORMAL HIGH (ref 70–99)

## 2014-11-29 LAB — MAGNESIUM: MAGNESIUM: 1.9 mg/dL (ref 1.5–2.5)

## 2014-11-29 MED ORDER — TRACE MINERALS CR-CU-F-FE-I-MN-MO-SE-ZN IV SOLN
INTRAVENOUS | Status: AC
  Administered 2014-11-29: 17:00:00 via INTRAVENOUS
  Filled 2014-11-29: qty 1800

## 2014-11-29 MED ORDER — FAT EMULSION 20 % IV EMUL
240.0000 mL | INTRAVENOUS | Status: AC
  Administered 2014-11-29: 240 mL via INTRAVENOUS
  Filled 2014-11-29: qty 250

## 2014-11-29 MED ORDER — POTASSIUM PHOSPHATES 15 MMOLE/5ML IV SOLN
20.0000 mmol | Freq: Once | INTRAVENOUS | Status: AC
  Administered 2014-11-29: 20 mmol via INTRAVENOUS
  Filled 2014-11-29: qty 6.67

## 2014-11-29 MED ORDER — ENOXAPARIN SODIUM 40 MG/0.4ML ~~LOC~~ SOLN
40.0000 mg | SUBCUTANEOUS | Status: DC
  Administered 2014-11-29 – 2014-12-09 (×11): 40 mg via SUBCUTANEOUS
  Filled 2014-11-29 (×11): qty 0.4

## 2014-11-29 MED ORDER — FENTANYL CITRATE 0.05 MG/ML IJ SOLN
100.0000 ug | INTRAMUSCULAR | Status: DC | PRN
  Administered 2014-11-29 – 2014-12-07 (×18): 100 ug via INTRAVENOUS
  Filled 2014-11-29 (×18): qty 2

## 2014-11-29 MED ORDER — LEVOTHYROXINE SODIUM 100 MCG PO TABS
100.0000 ug | ORAL_TABLET | Freq: Every day | ORAL | Status: DC
  Administered 2014-11-29 – 2014-12-01 (×3): 100 ug via ORAL
  Filled 2014-11-29 (×4): qty 1

## 2014-11-29 NOTE — Progress Notes (Signed)
Patient ID: April BatheShelby J Hull, female   DOB: 10-23-1940, 74 y.o.   MRN: [id number] 2 Days Post-Op  Subjective: Pt wants NGT out desperately.  Sat up in a chair for 1.5 hrs yesterday.  Pain controlled.   Objective: Vital signs in last 24 hours: Temp:  [97.7 F (36.5 C)-98.3 F (36.8 C)] 97.7 F (36.5 C) (02/12 0118) Pulse Rate:  [90-98] 96 (02/12 0501) Resp:  [16-26] 22 (02/12 0522) BP: (111-143)/(58-67) 140/67 mmHg (02/12 0501) SpO2:  [4 %-98 %] 98 % (02/12 0522) Last BM Date: 11/22/14  Intake/Output from previous day: 02/11 0701 - 02/12 0700 In: 4918 [IV Piggyback:3800; TPN:1088] Out: 1610 [Urine:1350; Emesis/NG output:200; Drains:60] Intake/Output this shift:    PE: Abd: soft, some BS, but no function in ostomy yet.  Wound is clean and packed.  JP drain with serosang drainage. Heart: regular Lungs: CTAB  Lab Results:  No results for input(s): WBC, HGB, HCT, PLT in the last 72 hours. BMET  Recent Labs  11/28/14 0520 11/29/14 0425  NA 130* 128*  K 3.5 3.7  CL 101 99  CO2 26 23  GLUCOSE 121* 114*  BUN 10 10  CREATININE 0.39* 0.40*  CALCIUM 7.4* 7.4*   PT/INR No results for input(s): LABPROT, INR in the last 72 hours. CMP     Component Value Date/Time   NA 128* 11/29/2014 0425   K 3.7 11/29/2014 0425   CL 99 11/29/2014 0425   CO2 23 11/29/2014 0425   GLUCOSE 114* 11/29/2014 0425   BUN 10 11/29/2014 0425   CREATININE 0.40* 11/29/2014 0425   CALCIUM 7.4* 11/29/2014 0425   PROT 4.2* 11/28/2014 0520   ALBUMIN 1.8* 11/28/2014 0520   AST 19 11/28/2014 0520   ALT 7 11/28/2014 0520   ALKPHOS 55 11/28/2014 0520   BILITOT 0.3 11/28/2014 0520   GFRNONAA >90 11/29/2014 0425   GFRAA >90 11/29/2014 0425   Lipase  No results found for: LIPASE     Studies/Results: No results found.  Anti-infectives: Anti-infectives    Start     Dose/Rate Route Frequency Ordered Stop   11/27/14 1100  cefOXitin (MEFOXIN) 2 g in dextrose 5 % 50 mL IVPB  Status:  Discontinued      2 g 100 mL/hr over 30 Minutes Intravenous To Surgery 11/27/14 1106 11/27/14 1446   11/27/14 0600  cefOXitin (MEFOXIN) 2 g in dextrose 5 % 50 mL IVPB     2 g 100 mL/hr over 30 Minutes Intravenous On call to O.R. 11/26/14 1357 11/27/14 0850   11/22/14 1500  ciprofloxacin (CIPRO) IVPB 400 mg     400 mg 200 mL/hr over 60 Minutes Intravenous Every 12 hours 11/22/14 1447     11/22/14 1500  metroNIDAZOLE (FLAGYL) IVPB 500 mg     500 mg 100 mL/hr over 60 Minutes Intravenous Every 8 hours 11/22/14 1447         Assessment/Plan   1. POD 2, s/p Hartman's procedure for sigmoid diverticulitis with pelvic abscess 2. COPD 3. HTN 4. Aortic Stenosis  Plan: 1. DC NGT and give sips of clears, no tray yet 2. PT consult ordered yesterday.  Patient needs to start mobilizing today 3. Foley DC this am 4. pulm toilet 5. Cont bronchodilators as needed for COPD 6. Change synthroid to oral. 7. Cont Cipro/Flagyl  D 7/12 (will complete 7 days post op given abscess during surgery) 8. Lovenox/SCDs for prophylaxis   LOS: 7 days    Oriyah Lamphear E 11/29/2014, 8:01 AM Pager: [phone]

## 2014-11-29 NOTE — Consult Note (Signed)
WOC ostomy follow up Stoma type/location: Colostomy from 2/10 to LLQ Stomal assessment/size:  Stoma red and viable, flush with skin level, 1 1/4 inches Peristomal assessment:  Intact skin surrounding, crease located to 9:00 o'clock Output  No stool or flatus at this time. Pouching: Applied one piece with barrier ring to maintain seal. Education provided:  Demonstrated pouch application and emptying to patient and daughter at bedside.  Pt is able to open and close velcro to empty.  She asked appropriate questions.  Discussed pouching routines and ordering supplies.  Educational materials left at bedside.  Plan to perform another educational session on Monday. Enrolled [place] in [place] [employer] [place] Discharge program: No Cammie Mcgeeawn Addison Whidbee MSN, RN, Kathrynn HumbleCWOCN, CWCN-AP, [place] [phone]

## 2014-11-29 NOTE — Clinical Social Work Note (Signed)
CSW attempted to meet with patient at bedside to discuss discharge disposition. Patient currently asleep. CSW attempted to arouse patient 3 times. Patient would not awake to speak with CSW. CSW to continue to follow and assist with discharge planning needs.  Marcelline Deistmily Lavern Crimi, LCSWA [phone]) Licensed Clinical Social Worker Orthopedics [phone]([id number]) and Surgical [phone]([id number])

## 2014-11-29 NOTE — Progress Notes (Addendum)
NUTRITION FOLLOW UP  Pt meets criteria for SEVERE MALNUTRITION in the context of acute illness or injury as evidenced by a 11.6% weight loss in 1 month and severe fat and muscle mass loss.  DOCUMENTATION CODES Per approved criteria  -Severe malnutrition in the context of acute illness or injury       Intervention:   -TPN per pharmacy -RD to follow for advancement to PO diet as medically feasible  Nutrition Dx:   Inadequate oral intake related to large bowel obstruction and inability to eat as evidenced by patient report. Ongoing.  Goal:   Intake to meet >90% of estimated nutrition needs.  Monitor:   TPN tolerance/adequacy, ability to start PO diet, weight trend, labs.  Assessment:   Patient admitted to [facility] on 2/5 with stricture of sigmoid colon. CT revealed pericolonic abscess.  S/p Procedure(s) on 11/27/14: LAPAROSCOPIC SIGMOID COLECTOMY (N/A) COLOSTOMY (N/A)  NGT was d/c this morning. Plan is to give pt sips of clears from floor today. She is not to be advanced to a clear liquid diet yet. Pt remains on TPN, currently receiving  Clinimix E 5/15 at goal rate of 75 ml/hr + 20% IVFE at 10 ml/hr. TPN provides 1758 kCal and 90gm of protein daily, meeting 100% of patient's needs.  Labs reviewed. Na: 128, Creat: 0.48, Calcium: 7.4, Phos: 1.8 (receiving supplement), Glucose: 114, CBGS: 114-129. Mg and K WDL.   Height: Ht Readings from Last 1 Encounters:  11/22/14  (1.6 m)    Weight Status:   Wt Readings from Last 1 Encounters:  11/24/14 127 lb 13.9 oz (58 kg)   11/24/14 127 lb 13.9 oz (58 kg)   11/22/14 106 lb (48.081 kg)       Re-estimated needs:  Kcal: 1700-1900 Protein: 80-90 grams Fluid: 1.7-1.9 L  Skin: closed abdominal incision, JP drain, 2 port abdominal incision, LLQ colostomy  Diet Order: TPN (CLINIMIX-E) Adult TPN (CLINIMIX-E) Adult Diet NPO time specified Except for: Ice Chips, Other (See Comments)   Intake/Output Summary (Last 24 hours)  at 11/29/14 1005 Last data filed at 11/29/14 1191  Gross per 24 hour  Intake   4918 ml  Output   1790 ml  Net   3128 ml    Last BM: 11/22/14   Labs:   Recent Labs Lab 11/27/14 0608 11/28/14 0520 11/29/14 0425  NA 131* 130* 128*  K 3.9 3.5 3.7  CL 101 101 99  CO2 BUN CREATININE 0.48* 0.39* 0.40*  CALCIUM 8.2* 7.4* 7.4*  MG 2.0 1.7 1.9  PHOS 2.9 2.0* 1.8*  GLUCOSE 102* 121* 114*    CBG (last 3)   Recent Labs  11/28/14 1751 11/28/14 2331 11/29/14 0625  GLUCAP 129* 124* 114*    Scheduled Meds: . antiseptic oral rinse  7 mL Mouth Rinse q12n4p  . chlorhexidine  15 mL Mouth Rinse BID  . ciprofloxacin  400 mg Intravenous Q12H  . enoxaparin (LOVENOX) injection  40 mg Subcutaneous Q24H  . fentaNYL   Intravenous 6 times per day  . insulin aspart  0-9 Units Subcutaneous 4 times per day  . levothyroxine  100 mcg Oral QAC breakfast  . metronidazole  500 mg Intravenous Q8H  . pantoprazole (PROTONIX) IV  40 mg Intravenous QHS  . potassium phosphate IVPB (mmol)  20 mmol Intravenous Once  . sodium chloride  10-40 mL Intracatheter Q12H    Continuous Infusions: . sodium chloride 20 mL/hr at 11/27/14 1542  . Marland Kitchen  TPN (CLINIMIX-E) Adult 75 mL/hr at 11/28/14 1820   And  . fat emulsion 240 mL (11/28/14 1820)  . Marland Kitchen.TPN (CLINIMIX-E) Adult     And  . fat emulsion      Margia Wiesen A. Mayford KnifeWilliams, RD, LDN, CDE Pager: [phone] After hours Pager: [phone]

## 2014-11-29 NOTE — Progress Notes (Signed)
PARENTERAL NUTRITION CONSULT NOTE - FOLLOW UP  Pharmacy Consult:  TPN Indication:  Large bowel obstruction  Allergies  Allergen Reactions  . Bactrim [Sulfamethoxazole-Trimethoprim] Hives, Itching and Other (See Comments)    Irregular heart beat  . Codeine Palpitations    Dizziness, sweats     Patient Measurements: Height: 5\' 3"  (160 cm) Weight: 127 lb 13.9 oz (58 kg) IBW/kg (Calculated) : 52.4  Usual weight: 61 kg  Vital Signs: Temp: 97.7 F (36.5 C) (02/12 0118) Temp Source: Oral (02/12 0501) BP: 140/67 mmHg (02/12 0501) Pulse Rate: 96 (02/12 0501) Intake/Output from previous day: 02/11 0701 - 02/12 0700 In: 4918 [IV Piggyback:3800; TPN:1088] Out: 1610 [Urine:1350; Emesis/NG output:200; Drains:60]  Labs: No results for input(s): WBC, HGB, HCT, PLT, APTT, INR in the last 72 hours.   Recent Labs  11/27/14 0608 11/28/14 0520 11/29/14 0425  NA 131* 130* 128*  K 3.9 3.5 3.7  CL 101 101 99  CO2 22 26 23   GLUCOSE 102* 121* 114*  BUN 10 10 10   CREATININE 0.48* 0.39* 0.40*  CALCIUM 8.2* 7.4* 7.4*  MG 2.0 1.7 1.9  PHOS 2.9 2.0* 1.8*  PROT  --  4.2*  --   ALBUMIN  --  1.8*  --   AST  --  19  --   ALT  --  7  --   ALKPHOS  --  55  --   BILITOT  --  0.3  --    Estimated Creatinine Clearance: 50.3 mL/min (by C-G formula based on Cr of 0.4).    Recent Labs  11/28/14 1751 11/28/14 2331 11/29/14 0625  GLUCAP 129* 124* 114*     Insulin Requirements in the past 24 hours:  3 units SSI  Admit:  [name] admited after having constipation for several weeks and recent colonoscopy revealed stricture of sigmoid colon. CT revealed pericolonic abscess, s/p sigmoid colectomy and drainage of pelvic abscess on 11/27/14.  Patient continues on TPN for nurtional support.  GI: reported "food would not stay down" for ~3 months and has had significant weight loss in the past 1 month.  Baseline prealbumin low at 6.3.  Obstruction radiographically >> s/p sigmoid colectomy with  colostomy, SBR of distal ileum, drainage of pelvic abscess on 2/10.  On IV PPI Endo: no history DM - CBGs controlled  Lytes: mild hyponatremia, K+ and Mag low normal, Phos low at 1.8 Renal: SCr stable, CrCL 50 ml/min - good UOP 1 ml/kg/hr, NS at 20 ml/hr Pulm: COPD - RA >> 2L [place] - [place], Pulmicort Cards: HTN / aortic stenosis - BP controlled, tachy Hepatobil: LFTs / tbili / TG WNL Neuro: started Fentanyl PCA post-op + PRN Dilaudid, pain score 1-2 ID: Cipro/Flagyl D#8 for intra-abd infxn - afebrile, WBC WNL, abscess cx no growth - final Best Practices: Lovenox held for surgery TPN Access: right basilic double lumen PICC TPN day#: 5 (2/7 >> )  Current Nutrition:  TPN  Nutritional Goals:  1680-1880 kCal, 75-90 grams of protein per day   Plan:  - Continue Clinimix E 5/15 at goal rate of 75 ml/hr + 20% IVFE at 10 ml/hr.  TPN provides 1758 kCal and 90gm of protein daily, meeting 100% of patient's needs - Daily multivitamin and trace elements - Continue sensitive SSI post-operatively - KPhos 20 mmol IV x 1 - F/U AM labs  Celedonio MiyamotoJeremy Kalii Chesmore, PharmD, BCPS Clinical Pharmacist Pager [phone]   11/29/2014, 7:30 AM

## 2014-11-30 LAB — GLUCOSE, CAPILLARY
GLUCOSE-CAPILLARY: 120 mg/dL — AB (ref 70–99)
GLUCOSE-CAPILLARY: 122 mg/dL — AB (ref 70–99)
Glucose-Capillary: 108 mg/dL — ABNORMAL HIGH (ref 70–99)

## 2014-11-30 LAB — BASIC METABOLIC PANEL
Anion gap: 3 — ABNORMAL LOW (ref 5–15)
BUN: 10 mg/dL (ref 6–23)
CALCIUM: 6.9 mg/dL — AB (ref 8.4–10.5)
CO2: 28 mmol/L (ref 19–32)
Chloride: 92 mmol/L — ABNORMAL LOW (ref 96–112)
Creatinine, Ser: 0.31 mg/dL — ABNORMAL LOW (ref 0.50–1.10)
GFR calc Af Amer: >90 mL/min (ref >90)
GLUCOSE: 98 mg/dL (ref 70–99)
Potassium: 3.5 mmol/L (ref 3.5–5.1)
Sodium: 123 mmol/L — ABNORMAL LOW (ref 135–145)

## 2014-11-30 LAB — CBC
HCT: 21.3 % — ABNORMAL LOW (ref 36.0–46.0)
Hemoglobin: 7.1 g/dL — ABNORMAL LOW (ref 12.0–15.0)
MCH: 28.3 pg (ref 26.0–34.0)
MCHC: 33.3 g/dL (ref 30.0–36.0)
MCV: 84.9 fL (ref 78.0–100.0)
Platelets: 250 10*3/uL (ref 150–400)
RBC: 2.51 MIL/uL — ABNORMAL LOW (ref 3.87–5.11)
RDW: 14.3 % (ref 11.5–15.5)
WBC: 13 10*3/uL — AB (ref 4.0–10.5)

## 2014-11-30 LAB — MAGNESIUM: MAGNESIUM: 1.6 mg/dL (ref 1.5–2.5)

## 2014-11-30 LAB — PREPARE RBC (CROSSMATCH)

## 2014-11-30 LAB — ABO/RH: ABO/RH(D): O POS

## 2014-11-30 LAB — PHOSPHORUS: Phosphorus: 2.4 mg/dL (ref 2.3–4.6)

## 2014-11-30 MED ORDER — TRACE MINERALS CR-CU-F-FE-I-MN-MO-SE-ZN IV SOLN
INTRAVENOUS | Status: AC
  Administered 2014-11-30: 18:00:00 via INTRAVENOUS
  Filled 2014-11-30: qty 1800

## 2014-11-30 MED ORDER — ZOLPIDEM TARTRATE 5 MG PO TABS
5.0000 mg | ORAL_TABLET | Freq: Every evening | ORAL | Status: DC | PRN
  Administered 2014-12-01 – 2014-12-08 (×2): 5 mg via ORAL
  Filled 2014-11-30 (×2): qty 1

## 2014-11-30 MED ORDER — POTASSIUM PHOSPHATES 15 MMOLE/5ML IV SOLN
15.0000 mmol | Freq: Once | INTRAVENOUS | Status: AC
  Administered 2014-11-30: 15 mmol via INTRAVENOUS
  Filled 2014-11-30: qty 5

## 2014-11-30 MED ORDER — FAT EMULSION 20 % IV EMUL
240.0000 mL | INTRAVENOUS | Status: AC
  Administered 2014-11-30: 240 mL via INTRAVENOUS
  Filled 2014-11-30: qty 250

## 2014-11-30 MED ORDER — SODIUM CHLORIDE 0.9 % IV SOLN: Freq: Once | INTRAVENOUS | Status: DC

## 2014-11-30 MED ORDER — MAGNESIUM SULFATE 2 GM/50ML IV SOLN
2.0000 g | Freq: Once | INTRAVENOUS | Status: AC
  Administered 2014-11-30: 2 g via INTRAVENOUS
  Filled 2014-11-30: qty 50

## 2014-11-30 NOTE — Progress Notes (Addendum)
[facility]/Fatiha Guzy Progress Note 3 Days Post-Op  Subjective: Patient doing okay.  Generally not very happy and says she wixhes that she had not had surgery.  Not very specific about complaints except she has not slept well.  Objective: Vital signs in last 24 hours: Temp:  [97.6 F (36.4 C)-97.9 F (36.6 C)] 97.9 F (36.6 C) (02/13 0539) Pulse Rate:  [95-99] 96 (02/13 0539) Resp:  [20-26] 20 (02/13 0539) BP: (126-157)/(55-65) 126/62 mmHg (02/13 0606) SpO2:  [93 %-96 %] 93 % (02/13 0539) Last BM Date: 11/22/14  Intake/Output from previous day: 02/12 0701 - 02/13 0700 In: 890 [P.O.:80; I.V.:10; IV Piggyback:800] Out: 750 [Urine:580; Emesis/NG output:100; Drains:70] Intake/Output this shift:    General: No acute distress.  Lungs: Clear  Abd: Minimal bowel sounds.  Soft, distended.  No output of gas or stool from ostomy  Extremities: No clinical signs or symptoms of DVT  Neuro: Intact  Lab Results:  @LABLAST2 (wbc:2,hgb:2,hct:2,plt:2) BMET  Recent Labs  11/29/14 0425 11/30/14 0515  NA 128* 123*  K 3.7 3.5  CL 99 92*  CO2 23 28  GLUCOSE 114* 98  BUN 10 10  CREATININE 0.40* 0.31*  CALCIUM 7.4* 6.9*   PT/INR No results for input(s): LABPROT, INR in the last 72 hours. ABG No results for input(s): PHART, HCO3 in the last 72 hours.  Invalid input(s): PCO2, PO2  Studies/Results: No results found.  Anti-infectives: Anti-infectives    Start     Dose/Rate Route Frequency Ordered Stop   11/27/14 1100  cefOXitin (MEFOXIN) 2 g in dextrose 5 % 50 mL IVPB  Status:  Discontinued     2 g 100 mL/hr over 30 Minutes Intravenous To Surgery 11/27/14 1106 11/27/14 1446   11/27/14 0600  cefOXitin (MEFOXIN) 2 g in dextrose 5 % 50 mL IVPB     2 g 100 mL/hr over 30 Minutes Intravenous On call to O.R. 11/26/14 1357 11/27/14 0850   11/22/14 1500  ciprofloxacin (CIPRO) IVPB 400 mg     400 mg 200 mL/hr over 60 Minutes Intravenous Every 12 hours 11/22/14 1447     11/22/14 1500   metroNIDAZOLE (FLAGYL) IVPB 500 mg     500 mg 100 mL/hr over 60 Minutes Intravenous Every 8 hours 11/22/14 1447        Assessment/Plan: s/p Procedure(s): LAPAROSCOPIC ASSISTED SIGMOID COLECTOMY COLOSTOMY Anemic to the tune of 7.1.  Not very symptomatic, but looks pale.  Not sleeping well Wound looks great. Hyponatremia, moderate.  Na+ 123.  Will bump up Normal saline drip and recheck tomorrow.  Cannot give hypertonic saline on the floor.    Will order some Ambien for sleep Recheck CBC tomorrow.  LOS: 8 days   Marta LamasJames O. Gae BonWyatt, III, MD, FACS [phone]--pager [phone]--office [facility] 11/30/2014

## 2014-11-30 NOTE — Progress Notes (Signed)
Patient is coughing/ spitting up yellowish green viscous fluid, similar to bile in appearance. Measured 50mL. Marlana SalvageMichelle Mathews, Student Nurse

## 2014-11-30 NOTE — Progress Notes (Signed)
PARENTERAL NUTRITION CONSULT NOTE - FOLLOW UP  Pharmacy Consult:  TPN Indication:  Large bowel obstruction  Allergies  Allergen Reactions  . Bactrim [Sulfamethoxazole-Trimethoprim] Hives, Itching and Other (See Comments)    Irregular heart beat  . Codeine Palpitations    Dizziness, sweats     Patient Measurements: Height: 5\' 3"  (160 cm) Weight: 127 lb 13.9 oz (58 kg) IBW/kg (Calculated) : 52.4  Usual weight: 61 kg  Vital Signs: Temp: 97.9 F (36.6 C) (02/13 0539) Temp Source: Oral (02/13 0539) BP: 126/62 mmHg (02/13 0606) Pulse Rate: 96 (02/13 0539) Intake/Output from previous day: 02/12 0701 - 02/13 0700 In: 890 [P.O.:80; I.V.:10; IV Piggyback:800] Out: 750 [Urine:580; Emesis/NG output:100; Drains:70]  Labs:  Recent Labs  11/30/14 0515  WBC 13.0*  HGB 7.1*  HCT 21.3*  PLT 250     Recent Labs  11/28/14 0520 11/29/14 0425 11/30/14 0515  NA 130* 128* 123*  K 3.5 3.7 3.5  CL 101 99 92*  CO2 26 23 28   GLUCOSE 121* 114* 98  BUN 10 10 10   CREATININE 0.39* 0.40* 0.31*  CALCIUM 7.4* 7.4* 6.9*  MG 1.7 1.9 1.6  PHOS 2.0* 1.8* 2.4  PROT 4.2*  --   --   ALBUMIN 1.8*  --   --   AST 19  --   --   ALT 7  --   --   ALKPHOS 55  --   --   BILITOT 0.3  --   --    Estimated Creatinine Clearance: 50.3 mL/min (by C-G formula based on Cr of 0.31).    Recent Labs  11/29/14 1722 11/30/14 0006 11/30/14 0545  GLUCAP 124* 120* 108*     Insulin Requirements in the past 24 hours:  3 units SSI  Admit:  [name] admited after having constipation for several weeks and recent colonoscopy revealed stricture of sigmoid colon. CT revealed pericolonic abscess, s/p sigmoid colectomy and drainage of pelvic abscess on 11/27/14.  Patient continues on TPN for nurtional support.  GI: reported "food would not stay down" for ~3 months and has had significant weight loss in the past 1 month.  Baseline prealbumin low at 6.3.  Obstruction radiographically >> s/p sigmoid colectomy with  colostomy, SBR of distal ileum, drainage of pelvic abscess on 2/10.  On IV PPI.  NG tube removed 2/12 and sips of clears started. Endo: no history DM - CBGs controlled  Lytes: Na 123, Phos 2.4, Mg 1.6, Ca 6.9 (corrects to 8.7) Renal: SCr stable, CrCL 50 ml/min - good UOP 0.4 ml/kg/hr, NS at 20 ml/hr.   Pulm: COPD - RA >> 2L Cameron - Brovana, Pulmicort Cards: HTN / aortic stenosis - BP controlled, tachy Hepatobil: LFTs / tbili / TG WNL Neuro: started Fentanyl PCA post-op + PRN Dilaudid, pain score 1-2 ID: Cipro/Flagyl D#9 for intra-abd infxn - afebrile, WBC 13.0, abscess cx no growth - final Best Practices: Lovenox  TPN Access: right basilic double lumen PICC TPN day#: 6 (2/7 >> )  Current Nutrition:  TPN  Nutritional Goals:  1680-1880 kCal, 75-90 grams of protein per day   Plan:  - Continue Clinimix E 5/15 at goal rate of 75 ml/hr + 20% IVFE at 10 ml/hr.  TPN provides 1758 kCal and 90gm of protein daily, meeting 100% of patient's needs - Daily multivitamin and trace elements - Discontinue SSI and CBGs - Magnesium 2g IV x 1 - KPhos 15 mmol IV x 1  Celedonio MiyamotoJeremy Tabetha Haraway, PharmD, BCPS Clinical Pharmacist Pager  [phone]   11/30/2014, 8:00 AM

## 2014-12-01 ENCOUNTER — Inpatient Hospital Stay (HOSPITAL_COMMUNITY): Payer: Medicare Other

## 2014-12-01 DIAGNOSIS — E871 Hypo-osmolality and hyponatremia: Secondary | ICD-10-CM

## 2014-12-01 LAB — TYPE AND SCREEN
ABO/RH(D): O POS
ANTIBODY SCREEN: NEGATIVE
UNIT DIVISION: 0

## 2014-12-01 LAB — CBC WITH DIFFERENTIAL/PLATELET
Basophils Absolute: 0.1 10*3/uL (ref 0.0–0.1)
Basophils Relative: 0 % (ref 0–1)
EOS ABS: 0.4 10*3/uL (ref 0.0–0.7)
EOS PCT: 3 % (ref 0–5)
HEMATOCRIT: 29.6 % — AB (ref 36.0–46.0)
Hemoglobin: 10.2 g/dL — ABNORMAL LOW (ref 12.0–15.0)
LYMPHS PCT: 15 % (ref 12–46)
Lymphs Abs: 2.4 10*3/uL (ref 0.7–4.0)
MCH: 29.1 pg (ref 26.0–34.0)
MCHC: 34.5 g/dL (ref 30.0–36.0)
MCV: 84.6 fL (ref 78.0–100.0)
Monocytes Absolute: 1.1 10*3/uL — ABNORMAL HIGH (ref 0.1–1.0)
Monocytes Relative: 7 % (ref 3–12)
NEUTROS PCT: 74 % (ref 43–77)
Neutro Abs: 11.5 10*3/uL — ABNORMAL HIGH (ref 1.7–7.7)
Platelets: 361 10*3/uL (ref 150–400)
RBC: 3.5 MIL/uL — ABNORMAL LOW (ref 3.87–5.11)
RDW: 14 % (ref 11.5–15.5)
WBC: 15.5 10*3/uL — AB (ref 4.0–10.5)

## 2014-12-01 LAB — BASIC METABOLIC PANEL
Anion gap: 5 (ref 5–15)
BUN: 10 mg/dL (ref 6–23)
CHLORIDE: 92 mmol/L — AB (ref 96–112)
CO2: 25 mmol/L (ref 19–32)
Calcium: 7.6 mg/dL — ABNORMAL LOW (ref 8.4–10.5)
Creatinine, Ser: <0.3 mg/dL — ABNORMAL LOW (ref 0.50–1.10)
Glucose, Bld: 80 mg/dL (ref 70–99)
Potassium: 4.1 mmol/L (ref 3.5–5.1)
Sodium: 122 mmol/L — ABNORMAL LOW (ref 135–145)

## 2014-12-01 MED ORDER — METOPROLOL TARTRATE 1 MG/ML IV SOLN
5.0000 mg | Freq: Four times a day (QID) | INTRAVENOUS | Status: DC | PRN
  Filled 2014-12-01: qty 5

## 2014-12-01 MED ORDER — FAT EMULSION 20 % IV EMUL
240.0000 mL | INTRAVENOUS | Status: AC
  Administered 2014-12-01: 240 mL via INTRAVENOUS
  Filled 2014-12-01: qty 250

## 2014-12-01 MED ORDER — TRACE MINERALS CR-CU-F-FE-I-MN-MO-SE-ZN IV SOLN
INTRAVENOUS | Status: AC
  Administered 2014-12-01: 18:00:00 via INTRAVENOUS
  Filled 2014-12-01: qty 1800

## 2014-12-01 NOTE — Clinical Social Work Placement (Addendum)
Clinical Social Work Department CLINICAL SOCIAL WORK PLACEMENT NOTE 12/01/2014  Patient:  April Hull,April Hull  Account Number:  [id number] Admit date:  11/22/2014  Clinical Social Worker:  [name], LCSWA  Date/time:  12/01/2014 10:34 PM  Clinical Social Work is seeking post-discharge placement for this patient at the following level of care:   SKILLED NURSING   (*CSW will update this form in Epic as items are completed)   12/01/2014  Patient/family provided with [facility] Department of Clinical Social Work's list of facilities offering this level of care within the geographic area requested by the patient (or if unable, by the patient's family).  12/01/2014  Patient/family informed of their freedom to choose among providers that offer the needed level of care, that participate in Medicare, Medicaid or managed care program needed by the patient, have an available bed and are willing to accept the patient.  12/01/2014  Patient/family informed of [facility]' ownership interest in [facility], as well as of the fact that they are under no obligation to receive care at this facility.  PASARR submitted to EDS on 11/29/2014 PASARR number received on 11/29/2014  FL2 transmitted to all facilities in geographic area requested by pt/family on  12/01/2014 FL2 transmitted to all facilities within larger geographic area on   Patient informed that his/her managed care company has contracts with or will negotiate with  certain facilities, including the following:     Patient/family informed of bed offers received:   12/06/2014 [facility] (Marcelline DeistEmily Nickolis Diel, [place]) Physician recommends and patient chooses bed at    [facility] be [facility]  12/09/2014 [name]([name], Theresia MajorsLCSWA) Patient to be transferred to facility by Patient's daughter transporting [name]([name], [place]) Patient and family notified of transfer on  12/09/2014 [name](Kelyse Pask, Theresia MajorsLCSWA) Name of family member notified:  Patient's daughter, [name], updated regarding discharge. [name]([name], LCSWA)  The following physician request were entered in Epic:   Additional Comments:  [name], [facility] Weekend Clinical Social Worker [phone]  Marcelline Deistmily Breyanna Valera, [place] [phone]) Licensed Clinical Social Worker Orthopedics [phone]([id number]) and Surgical [phone]([id number])

## 2014-12-01 NOTE — Clinical Social Work Psychosocial (Signed)
Clinical Social Work Department BRIEF PSYCHOSOCIAL ASSESSMENT 12/01/2014  Patient:  April Hull, April Hull     Account Number:  [id number]     Admit date:  11/22/2014  Clinical Social Worker:  Hubert Azure  Date/Time:  12/01/2014 04:38 PM  Referred by:  Physician  Date Referred:  12/01/2014 Referred for  SNF Placement   Other Referral:   Interview type:  Family Other interview type:   Patient's daughter was present at bedside.    PSYCHOSOCIAL DATA Living Status:  ALONE Admitted from facility:   Level of care:   Primary support name:  Regino Schultze Primary support relationship to patient:  CHILD, ADULT Degree of support available:   Good.    CURRENT CONCERNS Current Concerns  Post-Acute Placement   Other Concerns:    SOCIAL WORK ASSESSMENT / PLAN CSW met with patient's daughter who was present at bedside. CSW introduced self and explained role. CSW explained SNF placement process and discussed d/c plan. Per daughter, patient resides in [place], but she [place] in [place]. Daughter states this "was  preventable." and she was hoping patient could go home, but things have been happening so fast. Per daughter, patient has a son who resides in [place], so she would prefer to have patient in a SNF in [place] or [place] as it would be closer to her. CSW acknowledged daughter's desire to be closer to patient for more rapid response to crises that may arise.   Assessment/plan status:  Other - See comment Other assessment/ plan:   CSW to submit PASARR and complete FL2 for placement.   Information/referral to community resources:   CSW provided patient's daughter with community SNF list.    PATIENT'S/FAMILY'S RESPONSE TO PLAN OF CARE: Patient's daughter blamed distance for her inability to provide appropriate supervision for patient as her current hospitalization could have been avoided. Daughter was assisted in processing support provided during  hospitalization and active participation in d/c planning process. CSW provided appropriate emotional support to patient's daughter.    [place], [place] Weekend Clinical Social Worker [phone]

## 2014-12-01 NOTE — Progress Notes (Signed)
PARENTERAL NUTRITION CONSULT NOTE - FOLLOW UP  Pharmacy Consult:  TPN Indication:  Large bowel obstruction  Allergies  Allergen Reactions  . Bactrim [Sulfamethoxazole-Trimethoprim] Hives, Itching and Other (See Comments)    Irregular heart beat  . Codeine Palpitations    Dizziness, sweats     Patient Measurements: Height: 5\' 3"  (160 cm) Weight: 127 lb 13.9 oz (58 kg) IBW/kg (Calculated) : 52.4  Usual weight: 61 kg  Vital Signs: Temp: 98.3 F (36.8 C) (02/14 0616) Temp Source: Oral (02/14 0616) BP: 166/81 mmHg (02/14 0616) Pulse Rate: 102 (02/14 0616) Intake/Output from previous day: 02/13 0701 - 02/14 0700 In: 335 [Blood:335] Out: 1415 [Urine:1075; Drains:80]  Labs:  Recent Labs  11/30/14 0515 12/01/14 0545  WBC 13.0* 15.5*  HGB 7.1* 10.2*  HCT 21.3* 29.6*  PLT 250 361     Recent Labs  11/29/14 0425 11/30/14 0515 12/01/14 0545  NA 128* 123* 122*  K 3.7 3.5 4.1  CL 99 92* 92*  CO2 23 28 25   GLUCOSE 114* 98 80  BUN 10 10 10   CREATININE 0.40* 0.31* <0.30*  CALCIUM 7.4* 6.9* 7.6*  MG 1.9 1.6  --   PHOS 1.8* 2.4  --    CrCl cannot be calculated (Patient has no serum creatinine result on file.).    Recent Labs  11/30/14 0006 11/30/14 0545 11/30/14 1157  GLUCAP 120* 108* 122*     Insulin Requirements in the past 24 hours:  None - CBGs discontinued  Admit:  [name] admited after having constipation for several weeks and recent colonoscopy revealed stricture of sigmoid colon. CT revealed pericolonic abscess, s/p sigmoid colectomy and drainage of pelvic abscess on 11/27/14.  Patient continues on TPN for nurtional support.  GI: reported "food would not stay down" for ~3 months and has had significant weight loss in the past 1 month.  Baseline prealbumin low at 6.3.  Obstruction radiographically >> s/p sigmoid colectomy with colostomy, SBR of distal ileum, drainage of pelvic abscess on 2/10.  On IV PPI.  NG tube removed 2/12 and sips of clears  started. Endo: no history DM - CBGs controlled  Lytes: Na 122, K 4.1, Phos 2.4, Mg 1.6, Ca 7.6 (corrects to 9.3).  MD increased normal saline yesterday.  Hyponatremia persists. Renal: SCr stable, CrCL 50 ml/min - good UOP 0.8 ml/kg/hr, NS at 50 ml/hr.   Pulm: COPD - RA - Brovana, Pulmicort Cards: HTN / aortic stenosis - BP mildly elevated, tachy Hepatobil: LFTs / tbili / TG WNL Neuro: morphine, fentanyl PRN ID: Cipro/Flagyl D#10 for intra-abd infxn - afebrile, WBC 13.0, abscess cx no growth - final Best Practices: Lovenox  TPN Access: right basilic double lumen PICC TPN day#: 7 (2/7 >> )  Current Nutrition:  TPN  Nutritional Goals:  1680-1880 kCal, 75-90 grams of protein per day   Plan:  - Continue Clinimix E 5/15 at goal rate of 75 ml/hr + 20% IVFE at 10 ml/hr.  TPN provides 1758 kCal and 90gm of protein daily, meeting 100% of patient's needs - Daily multivitamin and trace elements - TPN labs tomorrow  April MiyamotoJeremy Izzy Hull, PharmD, BCPS Clinical Pharmacist Pager [phone]   12/01/2014, 7:34 AM

## 2014-12-01 NOTE — Progress Notes (Signed)
Patient ID: April Hull, female   DOB: 09-08-1941, 74 y.o.   MRN: [id number]     [facility]      [address] [place], [place] [address]    Phone: [phone] FAX: [phone]     Subjective: Vomiting since yesterday, bilious.  Na down from 123 to 122.  VSS.  Afebrile.  WBC up to 15.5k.   Objective:  Vital signs:  Filed Vitals:   11/30/14 1830 11/30/14 2102 11/30/14 2145 12/01/14 0616  BP: 136/61 154/76 140/74 166/81  Pulse: 96 92 95 102  Temp: 98 F (36.7 C) 98.1 F (36.7 C) 98 F (36.7 C) 98.3 F (36.8 C)  TempSrc: Oral Oral Oral Oral  Resp: '20 18 18 18  ' Height:      Weight:      SpO2: 96% 96% 96% 95%    Last BM Date: 11/22/14  Intake/Output   Yesterday:  02/13 0701 - 02/14 0700 In: 335 [Blood:335] Out: 0962 [Urine:1075; Drains:80] This shift:    Physical Exam: General: Pt awake/alert/oriented x4 in no acute distress Chest: cta.  No chest wall pain w good excursion CV:  s1s2 rrr, 3/6 SEM. MS: Normal AROM mjr joints.  No obvious deformity Abdomen: Soft. distended. Hypoactive bowel sounds.  ttp over incision.  JP drain with serosanguinous output.  No evidence of peritonitis.  No incarcerated hernias. Ext:  SCDs BLE.  No mjr edema.  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   Protein-calorie malnutrition, severe    Results:   Labs: Results for orders placed or performed during the hospital encounter of 11/22/14 (from the past 48 hour(s))  Glucose, capillary     Status: Abnormal   Collection Time: 11/29/14 11:58 AM   Result Value Ref Range   Glucose-Capillary 128 (H) 70 - 99 mg/dL  Glucose, capillary     Status: Abnormal   Collection Time: 11/29/14  5:22 PM  Result Value Ref Range   Glucose-Capillary 124 (H) 70 - 99 mg/dL  Glucose, capillary     Status: Abnormal   Collection Time: 11/30/14 12:06 AM  Result Value Ref Range   Glucose-Capillary 120 (H) 70 - 99 mg/dL  Basic metabolic panel     Status: Abnormal   Collection Time: 11/30/14  5:15 AM  Result Value Ref Range   Sodium 123 (L) 135 - 145 mmol/L   Potassium 3.5 3.5 - 5.1 mmol/L   Chloride 92 (L) 96 - 112 mmol/L   CO2 28 19 - 32 mmol/L   Glucose, Bld 98 70 - 99 mg/dL   BUN 10 6 - 23 mg/dL   Creatinine, Ser 0.31 (L) 0.50 - 1.10 mg/dL   Calcium 6.9 (L) 8.4 - 10.5 mg/dL   GFR calc non Af Amer >90 >90 mL/min   GFR calc Af Amer >90 >90 mL/min    Comment: (NOTE) The eGFR has been calculated using the CKD EPI equation. This calculation has not been validated in all clinical situations. eGFR's persistently <90 mL/min signify possible Chronic Kidney Disease.    Anion gap 3 (L) 5 - 15  Phosphorus  Status: None   Collection Time: 11/30/14  5:15 AM  Result Value Ref Range   Phosphorus 2.4 2.3 - 4.6 mg/dL  Magnesium     Status: None   Collection Time: 11/30/14  5:15 AM  Result Value Ref Range   Magnesium 1.6 1.5 - 2.5 mg/dL  CBC     Status: Abnormal   Collection Time: 11/30/14  5:15 AM  Result Value Ref Range   WBC 13.0 (H) 4.0 - 10.5 K/uL   RBC 2.51 (L) 3.87 - 5.11 MIL/uL   Hemoglobin 7.1 (L) 12.0 - 15.0 g/dL   HCT 21.3 (L) 36.0 - 46.0 %   MCV 84.9 78.0 - 100.0 fL   MCH 28.3 26.0 - 34.0 pg   MCHC 33.3 30.0 - 36.0 g/dL   RDW 14.3 11.5 - 15.5 %   Platelets 250 150 - 400 K/uL  Glucose, capillary     Status: Abnormal   Collection Time: 11/30/14  5:45 AM  Result Value Ref Range   Glucose-Capillary 108 (H) 70 - 99 mg/dL  Prepare RBC     Status: None   Collection Time: 11/30/14 11:30 AM  Result Value Ref Range   Order Confirmation  ORDER PROCESSED BY BLOOD BANK   Glucose, capillary     Status: Abnormal   Collection Time: 11/30/14 11:57 AM  Result Value Ref Range   Glucose-Capillary 122 (H) 70 - 99 mg/dL  Type and screen     Status: None   Collection Time: 11/30/14  1:00 PM  Result Value Ref Range   ABO/RH(D) O POS    Antibody Screen NEG    Sample Expiration 12/03/2014    Unit Number [id number]    Blood Component Type RED CELLS,LR    Unit division 00    Status of Unit ISSUED,FINAL    Transfusion Status OK TO TRANSFUSE    Crossmatch Result Compatible   ABO/Rh     Status: None   Collection Time: 11/30/14  1:00 PM  Result Value Ref Range   ABO/RH(D) O POS   Basic metabolic panel     Status: Abnormal   Collection Time: 12/01/14  5:45 AM  Result Value Ref Range   Sodium 122 (L) 135 - 145 mmol/L   Potassium 4.1 3.5 - 5.1 mmol/L   Chloride 92 (L) 96 - 112 mmol/L   CO2 25 19 - 32 mmol/L   Glucose, Bld 80 70 - 99 mg/dL   BUN 10 6 - 23 mg/dL   Creatinine, Ser <0.30 (L) 0.50 - 1.10 mg/dL   Calcium 7.6 (L) 8.4 - 10.5 mg/dL   GFR calc non Af Amer NOT CALCULATED >90 mL/min   GFR calc Af Amer NOT CALCULATED >90 mL/min    Comment: (NOTE) The eGFR has been calculated using the CKD EPI equation. This calculation has not been validated in all clinical situations. eGFR's persistently <90 mL/min signify possible Chronic Kidney Disease.    Anion gap 5 5 - 15  CBC with Differential/Platelet     Status: Abnormal   Collection Time: 12/01/14  5:45 AM  Result Value Ref Range   WBC 15.5 (H) 4.0 - 10.5 K/uL   RBC 3.50 (L) 3.87 - 5.11 MIL/uL   Hemoglobin 10.2 (L) 12.0 - 15.0 g/dL    Comment: DELTA CHECK NOTED POST TRANSFUSION SPECIMEN    HCT 29.6 (L) 36.0 - 46.0 %   MCV 84.6 78.0 - 100.0 fL   MCH 29.1 26.0 - 34.0 pg   MCHC 34.5 30.0 -  36.0 g/dL   RDW 14.0 11.5 - 15.5 %   Platelets 361 150 - 400 K/uL    Comment: DELTA CHECK NOTED   Neutrophils Relative % 74 43 - 77 %   Neutro Abs 11.5 (H) 1.7 - 7.7 K/uL    Lymphocytes Relative 15 12 - 46 %   Lymphs Abs 2.4 0.7 - 4.0 K/uL   Monocytes Relative 7 3 - 12 %   Monocytes Absolute 1.1 (H) 0.1 - 1.0 K/uL   Eosinophils Relative 3 0 - 5 %   Eosinophils Absolute 0.4 0.0 - 0.7 K/uL   Basophils Relative 0 0 - 1 %   Basophils Absolute 0.1 0.0 - 0.1 K/uL    Imaging / Studies: Dg Chest Port 1 View  12/01/2014   CLINICAL DATA:  Shortness of breath  EXAM: PORTABLE CHEST - 1 VIEW  COMPARISON:  Chest radiograph 05/13/2014  FINDINGS: Right upper extremity PICC line is present with tip terminating at the superior cavoatrial junction. Stable cardiac and mediastinal contours. Elevation of the right hemidiaphragm. Minimal right basilar heterogeneous opacities. No pleural effusion or pneumothorax.  IMPRESSION: Elevation right hemidiaphragm.  Minimal right basilar atelectasis.  Right upper extremity PICC line tip projects over the superior cavoatrial junction.   Electronically Signed   By: Lovey Newcomer M.D.   On: 12/01/2014 09:26   Dg Abd Portable 1v  12/01/2014   CLINICAL DATA:  Shortness of breath.  Vomiting.  EXAM: PORTABLE ABDOMEN - 1 VIEW  COMPARISON:  11/22/2014 CT of the abdomen and pelvis  FINDINGS: There is residual contrast in right-sided bowel loops since the previous exam. There is dilatation of small bowel loops in the central abdomen. Stomach is distended. No evidence for free air.  IMPRESSION: Dilated small bowel loops and stomach. Question of developing small bowel obstruction.   Electronically Signed   By: Nolon Nations M.D.   On: 12/01/2014 09:33    Medications / Allergies:  Scheduled Meds: . sodium chloride   Intravenous Once  . antiseptic oral rinse  7 mL Mouth Rinse q12n4p  . chlorhexidine  15 mL Mouth Rinse BID  . ciprofloxacin  400 mg Intravenous Q12H  . enoxaparin (LOVENOX) injection  40 mg Subcutaneous Q24H  . levothyroxine  100 mcg Oral QAC breakfast  . metronidazole  500 mg Intravenous Q8H  . pantoprazole (PROTONIX) IV  40 mg Intravenous  QHS  . sodium chloride  10-40 mL Intracatheter Q12H   Continuous Infusions: . sodium chloride 50 mL/hr at 11/30/14 1215  . Marland KitchenTPN (CLINIMIX-E) Adult 75 mL/hr at 11/30/14 1800   And  . fat emulsion 240 mL (11/30/14 1800)  . Marland KitchenTPN (CLINIMIX-E) Adult     And  . fat emulsion     PRN Meds:.albuterol, fentaNYL, morphine injection, ondansetron, sodium chloride, zolpidem  Antibiotics: Anti-infectives    Start     Dose/Rate Route Frequency Ordered Stop   11/27/14 1100  cefOXitin (MEFOXIN) 2 g in dextrose 5 % 50 mL IVPB  Status:  Discontinued     2 g 100 mL/hr over 30 Minutes Intravenous To Surgery 11/27/14 1106 11/27/14 1446   11/27/14 0600  cefOXitin (MEFOXIN) 2 g in dextrose 5 % 50 mL IVPB     2 g 100 mL/hr over 30 Minutes Intravenous On call to O.R. 11/26/14 1357 11/27/14 0850   11/22/14 1500  ciprofloxacin (CIPRO) IVPB 400 mg     400 mg 200 mL/hr over 60 Minutes Intravenous Every 12 hours 11/22/14 1447     11/22/14  1500  metroNIDAZOLE (FLAGYL) IVPB 500 mg     500 mg 100 mL/hr over 60 Minutes Intravenous Every 8 hours 11/22/14 1447        Assessment/Plan Diverticulitis with pelvic abscess POD#4 laparoscopic assisted sigmoid colectomy w/ colostomy---Dr. Brantley Stage Ileus -inset NGT to LWIS -AXR with contrast on the right, none on the left.  Last CT/contrast was on 2/6.  May need contrast study through her ostomy to check for obstruction -pulmonary toilet -cipro flagyl D#9/12 -SCD/lovenox -consult to [place] -dressing changes -continue drain care Leukocytosis -WBC up, afebrile, repeat labs in AM and continue with antibiotics -may need to be scanned if it continues to trend up.  PCM -TPN, reduce rate to 22m/hr COPD HTN -PRN metoprolol Aortic stenosis -monitor for fluid overload, CXR okay Hyponatremia -reduce TPN, continue with NS '@50ml' /hr -asymptomatic -repeat labs in AM -may need to have medicine consult if no improvement    EErby Pian [facility] Pager [phone] consults and floor pages call [phone] 12/01/2014 10:54 AM

## 2014-12-02 ENCOUNTER — Inpatient Hospital Stay (HOSPITAL_COMMUNITY): Payer: Medicare Other

## 2014-12-02 LAB — DIFFERENTIAL
BASOS ABS: 0 10*3/uL (ref 0.0–0.1)
BASOS PCT: 0 % (ref 0–1)
EOS ABS: 0.4 10*3/uL (ref 0.0–0.7)
EOS PCT: 3 % (ref 0–5)
LYMPHS PCT: 11 % — AB (ref 12–46)
Lymphs Abs: 1.5 10*3/uL (ref 0.7–4.0)
MONOS PCT: 9 % (ref 3–12)
Monocytes Absolute: 1.2 10*3/uL — ABNORMAL HIGH (ref 0.1–1.0)
Neutro Abs: 10.4 10*3/uL — ABNORMAL HIGH (ref 1.7–7.7)
Neutrophils Relative %: 77 % (ref 43–77)

## 2014-12-02 LAB — COMPREHENSIVE METABOLIC PANEL
ALT: 9 U/L (ref 0–35)
AST: 22 U/L (ref 0–37)
Albumin: 1.8 g/dL — ABNORMAL LOW (ref 3.5–5.2)
Alkaline Phosphatase: 64 U/L (ref 39–117)
Anion gap: 3 — ABNORMAL LOW (ref 5–15)
BILIRUBIN TOTAL: 0.3 mg/dL (ref 0.3–1.2)
BUN: 10 mg/dL (ref 6–23)
CO2: 27 mmol/L (ref 19–32)
Calcium: 7.5 mg/dL — ABNORMAL LOW (ref 8.4–10.5)
Chloride: 92 mmol/L — ABNORMAL LOW (ref 96–112)
Creatinine, Ser: 0.33 mg/dL — ABNORMAL LOW (ref 0.50–1.10)
GFR calc Af Amer: >90 mL/min (ref >90)
GFR calc non Af Amer: >90 mL/min (ref >90)
Glucose, Bld: 109 mg/dL — ABNORMAL HIGH (ref 70–99)
Potassium: 3.7 mmol/L (ref 3.5–5.1)
SODIUM: 122 mmol/L — AB (ref 135–145)
Total Protein: 4.8 g/dL — ABNORMAL LOW (ref 6.0–8.3)

## 2014-12-02 LAB — TRIGLYCERIDES: Triglycerides: 129 mg/dL (ref <150)

## 2014-12-02 LAB — CBC
HCT: 27.3 % — ABNORMAL LOW (ref 36.0–46.0)
HEMOGLOBIN: 9.4 g/dL — AB (ref 12.0–15.0)
MCH: 28.7 pg (ref 26.0–34.0)
MCHC: 34.4 g/dL (ref 30.0–36.0)
MCV: 83.2 fL (ref 78.0–100.0)
PLATELETS: 342 10*3/uL (ref 150–400)
RBC: 3.28 MIL/uL — AB (ref 3.87–5.11)
RDW: 14.1 % (ref 11.5–15.5)
WBC: 13.5 10*3/uL — ABNORMAL HIGH (ref 4.0–10.5)

## 2014-12-02 LAB — MAGNESIUM: Magnesium: 1.8 mg/dL (ref 1.5–2.5)

## 2014-12-02 LAB — PREALBUMIN: PREALBUMIN: 17.4 mg/dL — AB (ref 17.0–34.0)

## 2014-12-02 LAB — GLUCOSE, CAPILLARY: GLUCOSE-CAPILLARY: 96 mg/dL (ref 70–99)

## 2014-12-02 LAB — PHOSPHORUS: Phosphorus: 2.8 mg/dL (ref 2.3–4.6)

## 2014-12-02 MED ORDER — FAT EMULSION 20 % IV EMUL
240.0000 mL | INTRAVENOUS | Status: AC
  Administered 2014-12-02: 240 mL via INTRAVENOUS
  Filled 2014-12-02: qty 250

## 2014-12-02 MED ORDER — ALTEPLASE 2 MG IJ SOLR
2.0000 mg | Freq: Once | INTRAMUSCULAR | Status: AC
Start: 2014-12-02 — End: 2014-12-02
  Administered 2014-12-02: 2 mg
  Filled 2014-12-02: qty 2

## 2014-12-02 MED ORDER — TRACE MINERALS CR-CU-F-FE-I-MN-MO-SE-ZN IV SOLN
INTRAVENOUS | Status: AC
  Administered 2014-12-02: 18:00:00 via INTRAVENOUS
  Filled 2014-12-02: qty 1200

## 2014-12-02 MED ORDER — LEVOTHYROXINE SODIUM 100 MCG IV SOLR
50.0000 ug | Freq: Every day | INTRAVENOUS | Status: DC
  Administered 2014-12-02 – 2014-12-06 (×5): 50 ug via INTRAVENOUS
  Filled 2014-12-02 (×7): qty 5

## 2014-12-02 NOTE — Progress Notes (Signed)
PARENTERAL NUTRITION CONSULT NOTE - FOLLOW UP  Pharmacy Consult for TPN Indication: Large bowel obstruction  Allergies  Allergen Reactions  . Bactrim [Sulfamethoxazole-Trimethoprim] Hives, Itching and Other (See Comments)    Irregular heart beat  . Codeine Palpitations    Dizziness, sweats     Patient Measurements: Height:  (160 cm) Weight: 127 lb 13.9 oz (58 kg) IBW/kg (Calculated) : 52.4 Adjusted Body Weight:  Usual Weight:   Vital Signs: Temp: 98 F (36.7 C) (02/15 0605) Temp Source: Oral (02/15 0605) BP: 161/68 mmHg (02/15 0605) Pulse Rate: 96 (02/15 0605) Intake/Output from previous day: 02/14 0701 - 02/15 0700 In: -  Out: 1070 [Urine:100; Emesis/NG output:900; Drains:70] Intake/Output from this shift:    Labs:  Recent Labs  11/30/14 0515 12/01/14 0545 12/02/14 0215  WBC 13.0* 15.5* 13.5*  HGB 7.1* 10.2* 9.4*  HCT 21.3* 29.6* 27.3*  PLT 250 361 342     Recent Labs  11/30/14 0515 12/01/14 0545 12/02/14 0215  NA 123* 122* 122*  K 3.5 4.1 3.7  CL 92* 92* 92*  CO2 GLUCOSE 98 80 109*  BUN CREATININE 0.31* <0.30* 0.33*  CALCIUM 6.9* 7.6* 7.5*  MG 1.6  --  1.8  PHOS 2.4  --  2.8  PROT  --   --  4.8*  ALBUMIN  --   --  1.8*  AST  --   --  22  ALT  --   --  9  ALKPHOS  --   --  64  BILITOT  --   --  0.3  TRIG  --   --  129   Estimated Creatinine Clearance: 50.3 mL/min (by C-G formula based on Cr of 0.33).    Recent Labs  11/30/14 0006 11/30/14 0545 11/30/14 1157  GLUCAP 120* 108* 122*    Medications:  Scheduled:  . sodium chloride   Intravenous Once  . antiseptic oral rinse  7 mL Mouth Rinse q12n4p  . chlorhexidine  15 mL Mouth Rinse BID  . ciprofloxacin  400 mg Intravenous Q12H  . enoxaparin (LOVENOX) injection  40 mg Subcutaneous Q24H  . levothyroxine  50 mcg Intravenous QAC breakfast  . metronidazole  500 mg Intravenous Q8H  . pantoprazole (PROTONIX) IV  40 mg Intravenous QHS  . sodium chloride  10-40  mL Intracatheter Q12H    Insulin Requirements in the past 24 hours:  None - CBGs discontinued  Current Nutrition:  TPN  Nutritional Goals:  1680-1880 kCal, 75-90 grams of protein per day  Admit:  44 YOF admited after having constipation for several weeks and recent colonoscopy revealed stricture of sigmoid colon. CT revealed pericolonic abscess, s/p sigmoid colectomy and drainage of pelvic abscess on 11/27/14. Patient continues on TPN for nurtional support.  GI: reported "food would not stay down" for ~3 months and has had significant weight loss in the past 1 month. Baseline prealbumin low at 6.3. Obstruction radiographically >> s/p sigmoid colectomy with colostomy, SBR of distal ileum, drainage of pelvic abscess on 2/10. Abd soft, NT/ND, (+)BS.  NG replaced- bilious output.  KUB with likely ileus, but poss SBO.  PPI-IV  Endo: Hypothyroid, no history DM - no SSI, CBGs controlled.  Synthroid IV  Lytes: Na 122- unchanged, K 3.7, Phos 2.8, Mg 1.8, Ca 7.6 (corrects to 9.3). MD increased normal saline yesterday. Hyponatremia persists.  Renal: SCr stable, CrCL 50 ml/min - good UOP 0.8 ml/kg/hr, NS at 50 ml/hr.  Pulm: COPD - RA - Brovana, Pulmicort  Cards:  HTN / aortic stenosis - BP mildly elevated, hr wnl.  Metoprolol IV prn  Hepatobil:  LFTs / tbili / TG remain WNL  Neuro: morphine, fentanyl PRN  ID:  Cipro/Flagyl D#10/12 for intra-abd infxn - afebrile, WBC 13.5, abscess cx no growth - final  Best Practices: Lovenox, SCDs, mouthcare TPN Access: right basilic double lumen PICC TPN day#:  2/7 >>   Plan:  -  MD requesting decrease TPN to 150ml/hr, d/t hyponatremia.  Also receiving NS at 1850ml/hr -  Dec Clinimix E 5/15 to 50 ml/hr + 20% IVFE at 10 ml/hr. TPN provides 1758 kCal and 90gm of protein daily, meeting 100% of patient's needs - Daily multivitamin and trace elements - BMet in AM  Marisue HumbleKendra Asusena Sigley, PharmD Clinical Pharmacist [facility]

## 2014-12-02 NOTE — Consult Note (Addendum)
WOC ostomy follow up Stoma type/location: Colostomy from 2/10 to LLQ Stomal assessment/size: Stoma pale red and edematous with yellow film over top which removes easily when wiped; flush with skin level, 1 1/4 inches Peristomal assessment: Intact skin surrounding, crease located to 9:00 o'clock Output No stool or flatus at this time. Gently inserted finger during pouch change to assess for patency; no stool was noted inside stoma canal. Pouching: Applied one piece with barrier ring to maintain seal. Education provided: Demonstrated pouch application and emptying to patient. Pt feeling poorly and did not participate. Plan to perform another educational session when she is feeling better later this week. Supplies at bedside for staff nurse use. Enrolled patient in [facility] [employer] Discharge program: No Cammie Mcgeeawn Johnsie Moscoso MSN, RN, Kathrynn HumbleCWOCN, CWCN-AP, [place] [phone]

## 2014-12-02 NOTE — Progress Notes (Signed)
Physical Therapy Treatment Patient Details Name: April Hull MRN: [id number] DOB: [birth year] Today's Date: 12/02/2014    History of Present Illness 74 y/o female presents with large bowel obstruction and s/p LAPAROSCOPIC SIGMOID COLECTOMY with COLOSTOMY     PT Comments    Pt. With good mobility with endurance this session, able to safely use the RW. Very agreeable and recognizes that walking will help her get stronger to get discharged to SNF when medically stable. Pt. Would benefit from more gait training to increase endurance and transfer training to increase independence with less cueing.   Follow Up Recommendations  SNF;Supervision/Assistance - 24 hour     Equipment Recommendations  Other (comment)    Recommendations for Other Services OT consult     Precautions / Restrictions Precautions Precautions: Fall Precaution Comments: multiple lines and leads, NGT, drain Restrictions Weight Bearing Restrictions: No    Mobility  Bed Mobility Overal bed mobility: Needs Assistance Bed Mobility: Supine to Sit     Supine to sit: Min assist     General bed mobility comments: cues for technique and min A to raise trunk into sitting position.   Transfers Overall transfer level: Needs assistance Equipment used: None;Rolling walker (2 wheeled) Transfers: Stand Pivot Transfers;Sit to/from Stand Sit to Stand: Min guard Stand pivot transfers: Min assist       General transfer comment: min A for hips and +2 line management, cues for hand placement  Ambulation/Gait Ambulation/Gait assistance: Min assist Ambulation Distance (Feet): 115 Feet Assistive device: Rolling walker (2 wheeled) Gait Pattern/deviations: Step-through pattern;Decreased stride length;Drifts right/left Gait velocity: WFL Gait velocity interpretation: at or above normal speed for age/gender General Gait Details: Good pace and needed one rest break; safe use of RW   Stairs            Wheelchair  Mobility    Modified Rankin (Stroke Patients Only)       Balance Overall balance assessment: Needs assistance Sitting-balance support: Feet supported;Bilateral upper extremity supported Sitting balance-Leahy Scale: Fair     Standing balance support: During functional activity;Single extremity supported   Standing balance comment: able to maintain balnce while performing pericare at [facility] with one hand supporting weight                    Cognition Arousal/Alertness: Awake/alert Behavior During Therapy: WFL for tasks assessed/performed Overall Cognitive Status: Within Functional Limits for tasks assessed                      Exercises      General Comments        Pertinent Vitals/Pain Pain Score: 3  Pain Location: abdomen Pain Descriptors / Indicators: Sore Pain Intervention(s): Monitored during session;Premedicated before session    Home Living                      Prior Function            PT Goals (current goals can now be found in the care plan section) Progress towards PT goals: Progressing toward goals    Frequency  Min 3X/week    PT Plan Current plan remains appropriate    Co-evaluation             End of Session Equipment Utilized During Treatment: Gait belt Activity Tolerance: Patient tolerated treatment well Patient left: in chair;with call bell/phone within reach     Time: 1024-1050 PT Time Calculation (min) (ACUTE ONLY): 26 min  Charges:                       G Codes:      Perlie MayoWilson, Lemoine Goyne, SPTA 12/02/2014, 11:30 AM

## 2014-12-02 NOTE — Progress Notes (Signed)
Patient ID: April Hull, female   DOB: [birth year], 74 y.o.   MRN: [id number]     [facility]      [address] [place], [place] [address]    Phone: [phone] FAX: [phone]     Subjective: No further n/v.  NGT 971m out, bilious.  AXR likely ileus, but cannot r/u SBO.   Objective:  Vital signs:  Filed Vitals:   12/01/14 1825 12/01/14 1853 12/02/14 0000 12/02/14 0605  BP: 140/68 140/68 134/69 161/68  Pulse: 91 91 88 96  Temp: 98 F (36.7 C) 98 F (36.7 C) 97.4 F (36.3 C) 98 F (36.7 C)  TempSrc: Oral Oral Oral Oral  Resp: _0 Height:      Weight:      SpO2: 95% 95% 97% 97%    Last BM Date: 11/22/14  Intake/Output   Yesterday:  02/14 0701 - 02/15 0700 In: -  Out: 1070 [Urine:100; Emesis/NG output:900; Drains:70] This shift:     Physical Exam: General: Pt awake/alert/oriented x4 in no acute distress Chest: cta. No chest wall pain w good excursion CV: s1s2 rrr, 3/6 SEM. MS: Normal AROM mjr joints. No obvious deformity Abdomen: Soft. Non distended. +bowel sounds. ttp over incision which is beefy red, packing replaced. JP drain with serosanguinous output. No evidence of peritonitis. No incarcerated hernias.  LLQ stoma is pink with central bullae/brownish, viable. Ext: SCDs BLE. No mjr edema. 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   Protein-calorie malnutrition, severe   Hyponatremia    Results:   Labs: Results for orders placed or performed during the hospital encounter of 11/22/14 (from the past 48  hour(s))  Prepare RBC     Status: None   Collection Time: 11/30/14 11:30 AM  Result Value Ref Range   Order Confirmation ORDER PROCESSED BY BLOOD BANK   Glucose, capillary     Status: Abnormal   Collection Time: 11/30/14 11:57 AM  Result Value Ref Range   Glucose-Capillary 122 (H) 70 - 99 mg/dL  Type and screen     Status: None   Collection Time: 11/30/14  1:00 PM  Result Value Ref Range   ABO/RH(D) O POS    Antibody Screen NEG    Sample Expiration 12/03/2014    Unit Number [id number]   Blood Component Type RED CELLS,LR    Unit division 00    Status of Unit ISSUED,FINAL    Transfusion Status OK TO TRANSFUSE    Crossmatch Result Compatible   ABO/Rh     Status: None   Collection Time: 11/30/14  1:00 PM  Result Value Ref Range   ABO/RH(D) O POS   Basic metabolic panel     Status: Abnormal   Collection Time: 12/01/14  5:45 AM  Result Value Ref Range   Sodium 122 (L) 135 - 145 mmol/L   Potassium 4.1 3.5 - 5.1 mmol/L   Chloride 92 (L) 96 - 112 mmol/L   CO2 25 19 - 32 mmol/L   Glucose, Bld 80 70 - 99 mg/dL   BUN 10 6 - 23 mg/dL  Creatinine, Ser <0.30 (L) 0.50 - 1.10 mg/dL   Calcium 7.6 (L) 8.4 - 10.5 mg/dL   GFR calc non Af Amer NOT CALCULATED >90 mL/min   GFR calc Af Amer NOT CALCULATED >90 mL/min    Comment: (NOTE) The eGFR has been calculated using the CKD EPI equation. This calculation has not been validated in all clinical situations. eGFR's persistently <90 mL/min signify possible Chronic Kidney Disease.    Anion gap 5 5 - 15  CBC with Differential/Platelet     Status: Abnormal   Collection Time: 12/01/14  5:45 AM  Result Value Ref Range   WBC 15.5 (H) 4.0 - 10.5 K/uL   RBC 3.50 (L) 3.87 - 5.11 MIL/uL   Hemoglobin 10.2 (L) 12.0 - 15.0 g/dL    Comment: DELTA CHECK NOTED POST TRANSFUSION SPECIMEN    HCT 29.6 (L) 36.0 - 46.0 %   MCV 84.6 78.0 - 100.0 fL   MCH 29.1 26.0 - 34.0 pg   MCHC 34.5 30.0 - 36.0 g/dL   RDW 14.0 11.5 - 15.5 %   Platelets 361 150 - 400  K/uL    Comment: DELTA CHECK NOTED   Neutrophils Relative % 74 43 - 77 %   Neutro Abs 11.5 (H) 1.7 - 7.7 K/uL   Lymphocytes Relative 15 12 - 46 %   Lymphs Abs 2.4 0.7 - 4.0 K/uL   Monocytes Relative 7 3 - 12 %   Monocytes Absolute 1.1 (H) 0.1 - 1.0 K/uL   Eosinophils Relative 3 0 - 5 %   Eosinophils Absolute 0.4 0.0 - 0.7 K/uL   Basophils Relative 0 0 - 1 %   Basophils Absolute 0.1 0.0 - 0.1 K/uL  Comprehensive metabolic panel     Status: Abnormal   Collection Time: 12/02/14  2:15 AM  Result Value Ref Range   Sodium 122 (L) 135 - 145 mmol/L   Potassium 3.7 3.5 - 5.1 mmol/L   Chloride 92 (L) 96 - 112 mmol/L   CO2 27 19 - 32 mmol/L   Glucose, Bld 109 (H) 70 - 99 mg/dL   BUN 10 6 - 23 mg/dL   Creatinine, Ser 0.33 (L) 0.50 - 1.10 mg/dL   Calcium 7.5 (L) 8.4 - 10.5 mg/dL   Total Protein 4.8 (L) 6.0 - 8.3 g/dL   Albumin 1.8 (L) 3.5 - 5.2 g/dL   AST 22 0 - 37 U/L   ALT 9 0 - 35 U/L   Alkaline Phosphatase 64 39 - 117 U/L   Total Bilirubin 0.3 0.3 - 1.2 mg/dL   GFR calc non Af Amer >90 >90 mL/min   GFR calc Af Amer >90 >90 mL/min    Comment: (NOTE) The eGFR has been calculated using the CKD EPI equation. This calculation has not been validated in all clinical situations. eGFR's persistently <90 mL/min signify possible Chronic Kidney Disease.    Anion gap 3 (L) 5 - 15  Magnesium     Status: None   Collection Time: 12/02/14  2:15 AM  Result Value Ref Range   Magnesium 1.8 1.5 - 2.5 mg/dL  Phosphorus     Status: None   Collection Time: 12/02/14  2:15 AM  Result Value Ref Range   Phosphorus 2.8 2.3 - 4.6 mg/dL  CBC     Status: Abnormal   Collection Time: 12/02/14  2:15 AM  Result Value Ref Range   WBC 13.5 (H) 4.0 - 10.5 K/uL   RBC 3.28 (L) 3.87 - 5.11 MIL/uL  Hemoglobin 9.4 (L) 12.0 - 15.0 g/dL   HCT 27.3 (L) 36.0 - 46.0 %   MCV 83.2 78.0 - 100.0 fL   MCH 28.7 26.0 - 34.0 pg   MCHC 34.4 30.0 - 36.0 g/dL   RDW 14.1 11.5 - 15.5 %   Platelets 342 150 - 400 K/uL   Differential     Status: Abnormal   Collection Time: 12/02/14  2:15 AM  Result Value Ref Range   Neutrophils Relative % 77 43 - 77 %   Neutro Abs 10.4 (H) 1.7 - 7.7 K/uL   Lymphocytes Relative 11 (L) 12 - 46 %   Lymphs Abs 1.5 0.7 - 4.0 K/uL   Monocytes Relative 9 3 - 12 %   Monocytes Absolute 1.2 (H) 0.1 - 1.0 K/uL   Eosinophils Relative 3 0 - 5 %   Eosinophils Absolute 0.4 0.0 - 0.7 K/uL   Basophils Relative 0 0 - 1 %   Basophils Absolute 0.0 0.0 - 0.1 K/uL  Triglycerides     Status: None   Collection Time: 12/02/14  2:15 AM  Result Value Ref Range   Triglycerides 129 <150 mg/dL    Imaging / Studies: Dg Chest Port 1 View  12/01/2014   CLINICAL DATA:  Shortness of breath  EXAM: PORTABLE CHEST - 1 VIEW  COMPARISON:  Chest radiograph 05/13/2014  FINDINGS: Right upper extremity PICC line is present with tip terminating at the superior cavoatrial junction. Stable cardiac and mediastinal contours. Elevation of the right hemidiaphragm. Minimal right basilar heterogeneous opacities. No pleural effusion or pneumothorax.  IMPRESSION: Elevation right hemidiaphragm.  Minimal right basilar atelectasis.  Right upper extremity PICC line tip projects over the superior cavoatrial junction.   Electronically Signed   By: Lovey Newcomer M.D.   On: 12/01/2014 09:26   Dg Abd 2 Views  12/02/2014   CLINICAL DATA:  Vomiting.  EXAM: ABDOMEN - 2 VIEW  COMPARISON:  12/01/2014.  CT 11/23/2014.  CT 11/22/2014.  FINDINGS: NG tube projected of the stomach. Drainage catheter projected over right lower quadrant. Dilated loops of small bowel and colon are noted. Small bowel distention is new. Although these findings may be secondary adynamic ileus, developing small bowel obstruction cannot be excluded no free air noted. Linear densities noted of the right lung base most likely represent atelectasis. Oral contrast noted in the right colon. No acute bony abnormality.  IMPRESSION: 1. NG tube noted projected over stomach.  Drainage catheter project over right lower quadrant. 2. Dilated loops of small and large bowel. Oral contrast is noted in the right colon. Although these changes most likely represent adynamic ileus, partial small bowel obstruction cannot be excluded. Continued follow-up abdominal series suggested.   Electronically Signed   By: Marcello Moores  Register   On: 12/02/2014 07:29   Dg Abd Portable 1v  12/01/2014   CLINICAL DATA:  Shortness of breath.  Vomiting.  EXAM: PORTABLE ABDOMEN - 1 VIEW  COMPARISON:  11/22/2014 CT of the abdomen and pelvis  FINDINGS: There is residual contrast in right-sided bowel loops since the previous exam. There is dilatation of small bowel loops in the central abdomen. Stomach is distended. No evidence for free air.  IMPRESSION: Dilated small bowel loops and stomach. Question of developing small bowel obstruction.   Electronically Signed   By: Nolon Nations M.D.   On: 12/01/2014 09:33    Medications / Allergies:  Scheduled Meds: . sodium chloride   Intravenous Once  . antiseptic oral rinse  7 mL  Mouth Rinse q12n4p  . chlorhexidine  15 mL Mouth Rinse BID  . ciprofloxacin  400 mg Intravenous Q12H  . enoxaparin (LOVENOX) injection  40 mg Subcutaneous Q24H  . levothyroxine  50 mcg Intravenous QAC breakfast  . metronidazole  500 mg Intravenous Q8H  . pantoprazole (PROTONIX) IV  40 mg Intravenous QHS  . sodium chloride  10-40 mL Intracatheter Q12H   Continuous Infusions: . sodium chloride 50 mL/hr at 12/01/14 1937  . Marland KitchenTPN (CLINIMIX-E) Adult 75 mL/hr at 12/01/14 1740   And  . fat emulsion 240 mL (12/01/14 1740)   PRN Meds:.albuterol, fentaNYL, metoprolol, morphine injection, ondansetron, sodium chloride, zolpidem  Antibiotics: Anti-infectives    Start     Dose/Rate Route Frequency Ordered Stop   11/27/14 1100  cefOXitin (MEFOXIN) 2 g in dextrose 5 % 50 mL IVPB  Status:  Discontinued     2 g 100 mL/hr over 30 Minutes Intravenous To Surgery 11/27/14 1106 11/27/14 1446    11/27/14 0600  cefOXitin (MEFOXIN) 2 g in dextrose 5 % 50 mL IVPB     2 g 100 mL/hr over 30 Minutes Intravenous On call to O.R. 11/26/14 1357 11/27/14 0850   11/22/14 1500  ciprofloxacin (CIPRO) IVPB 400 mg     400 mg 200 mL/hr over 60 Minutes Intravenous Every 12 hours 11/22/14 1447     11/22/14 1500  metroNIDAZOLE (FLAGYL) IVPB 500 mg     500 mg 100 mL/hr over 60 Minutes Intravenous Every 8 hours 11/22/14 1447        Assessment/Plan Diverticulitis with pelvic abscess POD#5 laparoscopic assisted sigmoid colectomy w/ colostomy---Dr. Brantley Stage Ileus -continue NGT to LWIS -AXR with contrast on the right, none on the left. Last CT/contrast was on 2/6. May need contrast study through her ostomy to check for obstruction if no improvement -pulmonary toilet -cipro flagyl D#10/12 -SCD/lovenox -consult to [place] -dressing changes -continue drain care(54m serosanguinous)  Leukocytosis -2/2 #1 WBC trending down PCM -TPN, reduce rate to 513mhr d/t hyponatremia  COPD HTN -PRN metoprolol Hypothyroidism -change to IV synthroid Aortic stenosis -monitor for fluid overload, CXR okay Hyponatremia -TPN reduced, continue with NS _0 /hr.  Unchanged since yesterday. -asymptomatic -repeat labs in AM -may need to have medicine consult if no improvement    [facility] Pager [phone](7A-4:30P) For consults and floor pages call [phone](7A-4:30P)  12/02/2014  9:10 AM

## 2014-12-03 LAB — BASIC METABOLIC PANEL
Anion gap: 7 (ref 5–15)
BUN: 8 mg/dL (ref 6–23)
CO2: 25 mmol/L (ref 19–32)
CREATININE: 0.36 mg/dL — AB (ref 0.50–1.10)
Calcium: 7.7 mg/dL — ABNORMAL LOW (ref 8.4–10.5)
Chloride: 92 mmol/L — ABNORMAL LOW (ref 96–112)
GFR calc Af Amer: >90 mL/min (ref >90)
GFR calc non Af Amer: >90 mL/min (ref >90)
Glucose, Bld: 124 mg/dL — ABNORMAL HIGH (ref 70–99)
Potassium: 3.5 mmol/L (ref 3.5–5.1)
SODIUM: 124 mmol/L — AB (ref 135–145)

## 2014-12-03 MED ORDER — WHITE PETROLATUM GEL
Status: AC
  Filled 2014-12-03: qty 1

## 2014-12-03 MED ORDER — TRACE MINERALS CR-CU-F-FE-I-MN-MO-SE-ZN IV SOLN
INTRAVENOUS | Status: AC
  Administered 2014-12-03: 18:00:00 via INTRAVENOUS
  Filled 2014-12-03: qty 1200

## 2014-12-03 MED ORDER — FAT EMULSION 20 % IV EMUL
240.0000 mL | INTRAVENOUS | Status: AC
  Administered 2014-12-03: 240 mL via INTRAVENOUS
  Filled 2014-12-03: qty 250

## 2014-12-03 MED ORDER — SODIUM CHLORIDE 0.9 % IV SOLN
INTRAVENOUS | Status: DC
  Administered 2014-12-03 – 2014-12-04 (×2): via INTRAVENOUS

## 2014-12-03 NOTE — Progress Notes (Signed)
PARENTERAL NUTRITION CONSULT NOTE - FOLLOW UP  Pharmacy Consult for TPN Indication: Large bowel obstruction  Allergies  Allergen Reactions  . Bactrim [Sulfamethoxazole-Trimethoprim] Hives, Itching and Other (See Comments)    Irregular heart beat  . Codeine Palpitations    Dizziness, sweats     Patient Measurements: Height:  (160 cm) Weight: 127 lb 13.9 oz (58 kg) IBW/kg (Calculated) : 52.4 Adjusted Body Weight:  Usual Weight:   Vital Signs: Temp: 97.6 F (36.4 C) (02/16 0535) Temp Source: Oral (02/16 0535) BP: 150/85 mmHg (02/16 0535) Pulse Rate: 85 (02/16 0535) Intake/Output from previous day: 02/15 0701 - 02/16 0700 In: 830 [I.V.:368; TPN:462] Out: 2165 [Urine:1650; Emesis/NG output:500; Drains:15] Intake/Output from this shift: Total I/O In: 0  Out: 500 [Urine:500]  Labs:  Recent Labs  12/01/14 0545 12/02/14 0215  WBC 15.5* 13.5*  HGB 10.2* 9.4*  HCT 29.6* 27.3*  PLT 361 342     Recent Labs  12/01/14 0545 12/02/14 0215 12/03/14 0417  NA 122* 122* 124*  K 4.1 3.7 3.5  CL 92* 92* 92*  CO2 GLUCOSE 80 109* 124*  BUN CREATININE <0.30* 0.33* 0.36*  CALCIUM 7.6* 7.5* 7.7*  MG  --  1.8  --   PHOS  --  2.8  --   PROT  --  4.8*  --   ALBUMIN  --  1.8*  --   AST  --  22  --   ALT  --  9  --   ALKPHOS  --  64  --   BILITOT  --  0.3  --   PREALBUMIN  --  17.4*  --   TRIG  --  129  --    Estimated Creatinine Clearance: 50.3 mL/min (by C-G formula based on Cr of 0.36).    Recent Labs  12/02/14 1749  GLUCAP 96    Medications:  Scheduled:  . sodium chloride   Intravenous Once  . antiseptic oral rinse  7 mL Mouth Rinse q12n4p  . chlorhexidine  15 mL Mouth Rinse BID  . ciprofloxacin  400 mg Intravenous Q12H  . enoxaparin (LOVENOX) injection  40 mg Subcutaneous Q24H  . levothyroxine  50 mcg Intravenous QAC breakfast  . metronidazole  500 mg Intravenous Q8H  . pantoprazole (PROTONIX) IV  40 mg Intravenous QHS  . sodium  chloride  10-40 mL Intracatheter Q12H    Insulin Requirements in the past 24 hours:  None - CBGs discontinued  Current Nutrition:  TPN  Nutritional Goals:  1680-1880 kCal, 75-90 grams of protein per day  Admit:  88 YOF admited after having constipation for several weeks and recent colonoscopy revealed stricture of sigmoid colon. CT revealed pericolonic abscess, s/p sigmoid colectomy and drainage of pelvic abscess on 11/27/14. reported "food would not stay down" for ~3 months and has had significant weight loss in the past 1 month. Baseline prealbumin low at 6.3. Patient continues on TPN for nurtional support.  GI:  Obstruction radiographically >> s/p sigmoid colectomy with colostomy, SBR of distal ileum, drainage of pelvic abscess on 2/10. Abd soft, NT/ND, (+)BS, (-)N/V, (+)stool in ostomy. NG bilious output, considering clamping trial. KUB with likely ileus, but poss SBO. PPI-IV  Endo: Hypothyroid, no history DM - no SSI, CBGs controlled. Synthroid IV  Lytes: Na 124- improved with dec tpn (per MD) & NS 31ml/hr, K 3.5  Renal: SCr stable, CrCL 50 ml/min - good UOP 0.8 ml/kg/hr, NS at 50  ml/hr.   Pulm: COPD - RA - Brovana, Pulmicort  Cards: HTN / aortic stenosis - BP mildly elevated, hr wnl. Metoprolol IV prn  Hepatobil: LFTs / tbili / TG remain WNL  Neuro: morphine, fentanyl PRN  ID: Cipro/Flagyl thru 2/17 for intra-abd infxn - afebrile, WBC 13.5, abscess cx no growth - final  Best Practices: Lovenox, SCDs, mouthcare TPN Access: right basilic double lumen PICC TPN day#: 2/7 >>   Plan:  -Clinimix E 5/15 to 50 ml/hr + 20% IVFE at 10 ml/hr. TPN provides 1332kCal and 60gm of protein daily, meeting 80% of caloric and protein needs - Daily multivitamin and trace elements - BMet in AM  Marisue HumbleKendra Johnel Yielding, PharmD Clinical Pharmacist [facility]

## 2014-12-03 NOTE — Progress Notes (Signed)
Patient ID: April Hull, female   DOB: [birth year], 74 y.o.   MRN: [id number]     [facility]      [address] [place], [place] [address]    Phone: [phone] FAX: [phone]     Subjective: Tearful and frustrated.  We talked for some time.  She is getting better.  Explained to her this is a big surgery and she was sick since October, so naturally things take time.    Has stool in ostomy.  No n/v.  574m NGT output, bilious. Not taking any ice chips in.  VSS.  Afebrile. Na is improved today.   Objective:  Vital signs:  Filed Vitals:   12/02/14 0605 12/02/14 1303 12/02/14 2145 12/03/14 0535  BP: 161/68 144/63 126/69 150/85  Pulse: 96 84 79 85  Temp: 98 F (36.7 C) 97.6 F (36.4 C) 97.7 F (36.5 C) 97.6 F (36.4 C)  TempSrc: Oral Oral Oral Oral  Resp: '17 17 18 18  ' Height:      Weight:      SpO2: 97% 97% 97% 94%    Last BM Date: 11/22/14  Intake/Output   Yesterday:  02/15 0701 - 02/16 0700 In: 830 [I.V.:368; TPN:462] Out: 2165 [Urine:1650; Emesis/NG output:500; Drains:15] This shift:  Total I/O In: -  Out: 250 [Urine:250]  Physical Exam: General: Pt awake/alert/oriented x4 in no acute distress Chest: cta. No chest wall pain w good excursion CV: s1s2 rrr, 3/6 SEM. MS: Normal AROM mjr joints. No obvious deformity Abdomen: Soft. Non distended. +bowel sounds. ttp over incision, dressing is dry.  JP drain with serosanguinous output. No evidence of peritonitis. No incarcerated hernias. LLQ stoma is pink and viable, solid stool. Ext: SCDs BLE. No mjr edema. 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   Protein-calorie malnutrition, severe   Hyponatremia    Results:   Labs: Results for orders placed or performed during the hospital encounter of 11/22/14 (from the past 48 hour(s))  Comprehensive metabolic panel     Status: Abnormal   Collection Time: 12/02/14  2:15 AM  Result Value Ref Range   Sodium 122 (L) 135 - 145 mmol/L   Potassium 3.7 3.5 - 5.1 mmol/L   Chloride 92 (L) 96 - 112 mmol/L   CO2 27 19 - 32 mmol/L   Glucose, Bld 109 (H) 70 - 99 mg/dL   BUN 10 6 - 23 mg/dL   Creatinine, Ser 0.33 (L) 0.50 - 1.10 mg/dL   Calcium 7.5 (L) 8.4 - 10.5 mg/dL   Total Protein 4.8 (L) 6.0 - 8.3 g/dL   Albumin 1.8 (L) 3.5 - 5.2 g/dL   AST 22 0 - 37 U/L   ALT 9 0 - 35 U/L   Alkaline Phosphatase 64 39 - 117 U/L   Total Bilirubin 0.3 0.3 - 1.2 mg/dL   GFR calc non Af Amer >90 >90 mL/min   GFR calc Af Amer >90 >90 mL/min    Comment: (NOTE) The eGFR has been calculated using the CKD EPI equation. This calculation has not been validated in all clinical situations. eGFR's persistently <90  mL/min signify possible Chronic Kidney Disease.    Anion gap 3 (L) 5 - 15  Magnesium     Status: None   Collection Time: 12/02/14  2:15 AM  Result Value Ref Range   Magnesium 1.8 1.5 - 2.5 mg/dL  Phosphorus     Status: None   Collection Time: 12/02/14  2:15 AM  Result Value Ref Range   Phosphorus 2.8 2.3 - 4.6 mg/dL  CBC     Status: Abnormal   Collection Time: 12/02/14  2:15 AM  Result Value Ref Range   WBC 13.5 (H) 4.0 - 10.5 K/uL   RBC 3.28 (L) 3.87 - 5.11 MIL/uL   Hemoglobin 9.4 (L) 12.0 - 15.0 g/dL   HCT 27.3 (L) 36.0 - 46.0 %   MCV 83.2 78.0 - 100.0 fL   MCH 28.7 26.0 - 34.0 pg   MCHC 34.4 30.0 - 36.0 g/dL   RDW 14.1 11.5 - 15.5 %   Platelets 342 150 - 400 K/uL  Differential     Status: Abnormal   Collection Time: 12/02/14  2:15 AM  Result Value Ref Range   Neutrophils Relative % 77 43 - 77 %   Neutro Abs  10.4 (H) 1.7 - 7.7 K/uL   Lymphocytes Relative 11 (L) 12 - 46 %   Lymphs Abs 1.5 0.7 - 4.0 K/uL   Monocytes Relative 9 3 - 12 %   Monocytes Absolute 1.2 (H) 0.1 - 1.0 K/uL   Eosinophils Relative 3 0 - 5 %   Eosinophils Absolute 0.4 0.0 - 0.7 K/uL   Basophils Relative 0 0 - 1 %   Basophils Absolute 0.0 0.0 - 0.1 K/uL  Prealbumin     Status: Abnormal   Collection Time: 12/02/14  2:15 AM  Result Value Ref Range   Prealbumin 17.4 (L) 17.0 - 34.0 mg/dL    Comment: Performed at [facility]  Triglycerides     Status: None   Collection Time: 12/02/14  2:15 AM  Result Value Ref Range   Triglycerides 129 <150 mg/dL  Glucose, capillary     Status: None   Collection Time: 12/02/14  5:49 PM  Result Value Ref Range   Glucose-Capillary 96 70 - 99 mg/dL  Basic metabolic panel     Status: Abnormal   Collection Time: 12/03/14  4:17 AM  Result Value Ref Range   Sodium 124 (L) 135 - 145 mmol/L   Potassium 3.5 3.5 - 5.1 mmol/L   Chloride 92 (L) 96 - 112 mmol/L   CO2 25 19 - 32 mmol/L   Glucose, Bld 124 (H) 70 - 99 mg/dL   BUN 8 6 - 23 mg/dL   Creatinine, Ser 0.36 (L) 0.50 - 1.10 mg/dL   Calcium 7.7 (L) 8.4 - 10.5 mg/dL   GFR calc non Af Amer >90 >90 mL/min   GFR calc Af Amer >90 >90 mL/min    Comment: (NOTE) The eGFR has been calculated using the CKD EPI equation. This calculation has not been validated in all clinical situations. eGFR's persistently <90 mL/min signify possible Chronic Kidney Disease.    Anion gap 7 5 - 15    Imaging / Studies: Dg Chest Port 1 View  12/01/2014   CLINICAL DATA:  Shortness of breath  EXAM: PORTABLE CHEST - 1 VIEW  COMPARISON:  Chest radiograph 05/13/2014  FINDINGS: Right upper extremity PICC line is present with tip terminating at the superior cavoatrial junction. Stable cardiac and mediastinal contours. Elevation of the right hemidiaphragm.  Minimal right basilar heterogeneous opacities. No pleural effusion or pneumothorax.  IMPRESSION: Elevation  right hemidiaphragm.  Minimal right basilar atelectasis.  Right upper extremity PICC line tip projects over the superior cavoatrial junction.   Electronically Signed   By: Lovey Newcomer M.D.   On: 12/01/2014 09:26   Dg Abd 2 Views  12/02/2014   CLINICAL DATA:  Vomiting.  EXAM: ABDOMEN - 2 VIEW  COMPARISON:  12/01/2014.  CT 11/23/2014.  CT 11/22/2014.  FINDINGS: NG tube projected of the stomach. Drainage catheter projected over right lower quadrant. Dilated loops of small bowel and colon are noted. Small bowel distention is new. Although these findings may be secondary adynamic ileus, developing small bowel obstruction cannot be excluded no free air noted. Linear densities noted of the right lung base most likely represent atelectasis. Oral contrast noted in the right colon. No acute bony abnormality.  IMPRESSION: 1. NG tube noted projected over stomach. Drainage catheter project over right lower quadrant. 2. Dilated loops of small and large bowel. Oral contrast is noted in the right colon. Although these changes most likely represent adynamic ileus, partial small bowel obstruction cannot be excluded. Continued follow-up abdominal series suggested.   Electronically Signed   By: Marcello Moores  Register   On: 12/02/2014 07:29   Dg Abd Portable 1v  12/01/2014   CLINICAL DATA:  Shortness of breath.  Vomiting.  EXAM: PORTABLE ABDOMEN - 1 VIEW  COMPARISON:  11/22/2014 CT of the abdomen and pelvis  FINDINGS: There is residual contrast in right-sided bowel loops since the previous exam. There is dilatation of small bowel loops in the central abdomen. Stomach is distended. No evidence for free air.  IMPRESSION: Dilated small bowel loops and stomach. Question of developing small bowel obstruction.   Electronically Signed   By: Nolon Nations M.D.   On: 12/01/2014 09:33    Medications / Allergies:  Scheduled Meds: . sodium chloride   Intravenous Once  . antiseptic oral rinse  7 mL Mouth Rinse q12n4p  . chlorhexidine  15  mL Mouth Rinse BID  . ciprofloxacin  400 mg Intravenous Q12H  . enoxaparin (LOVENOX) injection  40 mg Subcutaneous Q24H  . levothyroxine  50 mcg Intravenous QAC breakfast  . metronidazole  500 mg Intravenous Q8H  . pantoprazole (PROTONIX) IV  40 mg Intravenous QHS  . sodium chloride  10-40 mL Intracatheter Q12H   Continuous Infusions: . sodium chloride 50 mL/hr at 12/03/14 0751  . Marland KitchenTPN (CLINIMIX-E) Adult 50 mL/hr at 12/02/14 1750   And  . fat emulsion 240 mL (12/02/14 1750)   PRN Meds:.albuterol, fentaNYL, metoprolol, morphine injection, ondansetron, sodium chloride, zolpidem  Antibiotics: Anti-infectives    Start     Dose/Rate Route Frequency Ordered Stop   11/27/14 1100  cefOXitin (MEFOXIN) 2 g in dextrose 5 % 50 mL IVPB  Status:  Discontinued     2 g 100 mL/hr over 30 Minutes Intravenous To Surgery 11/27/14 1106 11/27/14 1446   11/27/14 0600  cefOXitin (MEFOXIN) 2 g in dextrose 5 % 50 mL IVPB     2 g 100 mL/hr over 30 Minutes Intravenous On call to O.R. 11/26/14 1357 11/27/14 0850   11/22/14 1500  ciprofloxacin (CIPRO) IVPB 400 mg     400 mg 200 mL/hr over 60 Minutes Intravenous Every 12 hours 11/22/14 1447     11/22/14 1500  metroNIDAZOLE (FLAGYL) IVPB 500 mg     500 mg 100 mL/hr over 60 Minutes Intravenous Every 8 hours 11/22/14 1447  Assessment/Plan Diverticulitis with pelvic abscess POD#6 laparoscopic assisted sigmoid colectomy w/ colostomy---Dr. Brantley Stage Ileus -continue NGT to LWIS, has ostomy function now, will consider clamping trial(535m bilious) -pulmonary toilet -cipro flagyl D#11/12 -SCD/lovenox -consult to WKerrick-dressing changes -continue drain care(144mserous) ?ok to remove Leukocytosis -2/2 #1 WBC trending down PCM -TPN, continue rate to 5030mr d/t hyponatremia  COPD HTN -PRN metoprolol Hypothyroidism -IV synthroid Aortic stenosis -monitor for fluid overload, CXR okay Hyponatremia -TPN reduced, continue with NS '@50ml' /hr. -improved to  124 -continue to monitor  EmiErby PianNPGeorgia Cataract And Eye [facility] Centerrgery Pager [phone](7A-4:30P) For consults and floor pages call [phone](7A-4:30P)  12/03/2014 8:15 AM

## 2014-12-04 LAB — CBC WITH DIFFERENTIAL/PLATELET
Basophils Absolute: 0.1 10*3/uL (ref 0.0–0.1)
Basophils Relative: 0 % (ref 0–1)
EOS ABS: 0.3 10*3/uL (ref 0.0–0.7)
Eosinophils Relative: 3 % (ref 0–5)
HEMATOCRIT: 28.2 % — AB (ref 36.0–46.0)
Hemoglobin: 9.4 g/dL — ABNORMAL LOW (ref 12.0–15.0)
LYMPHS ABS: 1.3 10*3/uL (ref 0.7–4.0)
LYMPHS PCT: 12 % (ref 12–46)
MCH: 28.6 pg (ref 26.0–34.0)
MCHC: 33.3 g/dL (ref 30.0–36.0)
MCV: 85.7 fL (ref 78.0–100.0)
MONOS PCT: 10 % (ref 3–12)
Monocytes Absolute: 1.1 10*3/uL — ABNORMAL HIGH (ref 0.1–1.0)
NEUTROS PCT: 75 % (ref 43–77)
Neutro Abs: 8.5 10*3/uL — ABNORMAL HIGH (ref 1.7–7.7)
PLATELETS: 397 10*3/uL (ref 150–400)
RBC: 3.29 MIL/uL — ABNORMAL LOW (ref 3.87–5.11)
RDW: 15.4 % (ref 11.5–15.5)
WBC: 11.3 10*3/uL — ABNORMAL HIGH (ref 4.0–10.5)

## 2014-12-04 LAB — BASIC METABOLIC PANEL
ANION GAP: 6 (ref 5–15)
BUN: 7 mg/dL (ref 6–23)
CHLORIDE: 98 mmol/L (ref 96–112)
CO2: 24 mmol/L (ref 19–32)
CREATININE: 0.33 mg/dL — AB (ref 0.50–1.10)
Calcium: 7.7 mg/dL — ABNORMAL LOW (ref 8.4–10.5)
GFR calc Af Amer: >90 mL/min (ref >90)
GFR calc non Af Amer: >90 mL/min (ref >90)
Glucose, Bld: 95 mg/dL (ref 70–99)
Potassium: 3.6 mmol/L (ref 3.5–5.1)
Sodium: 128 mmol/L — ABNORMAL LOW (ref 135–145)

## 2014-12-04 MED ORDER — TRACE MINERALS CR-CU-F-FE-I-MN-MO-SE-ZN IV SOLN
INTRAVENOUS | Status: AC
  Administered 2014-12-04: 18:00:00 via INTRAVENOUS
  Filled 2014-12-04: qty 1200

## 2014-12-04 MED ORDER — FAT EMULSION 20 % IV EMUL
240.0000 mL | INTRAVENOUS | Status: AC
  Administered 2014-12-04: 240 mL via INTRAVENOUS
  Filled 2014-12-04: qty 250

## 2014-12-04 NOTE — Consult Note (Signed)
WOC ostomy follow up [facility] following for assessment and plan of care to abd wound.  Colostomy to LLQ Pouch intact with good seal.  Small amt semi-formed stool in pouch.  Pt still with nausea and NG intact.  Plan to change pouch tomorrow. Supplies in room for bedside nurse use. Placed on [facility] discharge program.   Cammie Mcgeeawn Shenita Trego MSN, RN, Rober MinionCWOCN, ReftonWCN-AP, CNS [phone]

## 2014-12-04 NOTE — Care Management (Signed)
Medicare IM given. Jovanni Rash RN BSN  

## 2014-12-04 NOTE — Progress Notes (Signed)
Patient ID: April Hull, female   DOB: [birth year], 74 y.o.   MRN: [id number]     [address] [place], [place] [address]    Phone: [phone] FAX: [phone]     Subjective: NGT clamped x24 hrs.  Had vomiting last night.  Has stool function.   Objective:  Vital signs:  Filed Vitals:   12/03/14 0535 12/03/14 1432 12/03/14 2143 12/04/14 0447  BP: 150/85 141/65 138/67 155/65  Pulse: 85 82 88 86  Temp: 97.6 F (36.4 C) 97.5 F (36.4 C) 97.5 F (36.4 C) 98.5 F (36.9 C)  TempSrc: Oral Oral Oral Oral  Resp: '18 18 17 18  ' Height:      Weight:      SpO2: 94% 93% 96% 95%    Last BM Date: 12/03/14  Intake/Output   Yesterday:  02/16 0701 - 02/17 0700 In: 2124.2 [I.V.:1074.2; IV Piggyback:300; TPN:750] Out: 2336 [Urine:2325; Drains:10; Stool:1] This shift:  Total I/O In: -  Out: 400 [Urine:400]  Physical Exam: General: Pt awake/alert/oriented x4 in no acute distress Chest: cta. No chest wall pain w good excursion CV: s1s2 rrr, 3/6 SEM. MS: Normal AROM mjr joints. No obvious deformity Abdomen: Soft. Non distended. +bowel sounds. ttp over incision, beefy red, repacked. JP drain with serosanguinous output. No evidence of peritonitis. No incarcerated hernias. LLQ stoma is pink and viable, solid stool. Ext: SCDs BLE. No mjr edema. 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   Protein-calorie malnutrition, severe   Hyponatremia    Results:   Labs: Results for orders placed or performed during the hospital  encounter of 11/22/14 (from the past 48 hour(s))  Glucose, capillary     Status: None   Collection Time: 12/02/14  5:49 PM  Result Value Ref Range   Glucose-Capillary 96 70 - 99 mg/dL  Basic metabolic panel     Status: Abnormal   Collection Time: 12/03/14  4:17 AM  Result Value Ref Range   Sodium 124 (L) 135 - 145 mmol/L   Potassium 3.5 3.5 - 5.1 mmol/L   Chloride 92 (L) 96 - 112 mmol/L   CO2 25 19 - 32 mmol/L   Glucose, Bld 124 (H) 70 - 99 mg/dL   BUN 8 6 - 23 mg/dL   Creatinine, Ser 0.36 (L) 0.50 - 1.10 mg/dL   Calcium 7.7 (L) 8.4 - 10.5 mg/dL   GFR calc non Af Amer >90 >90 mL/min   GFR calc Af Amer >90 >90 mL/min    Comment: (NOTE) The eGFR has been calculated using the CKD EPI equation. This calculation has not been validated in all clinical situations. eGFR's persistently <90 mL/min signify possible Chronic Kidney Disease.    Anion gap 7 5 - 15  CBC with Differential/Platelet     Status: Abnormal   Collection Time: 12/04/14  4:55 AM  Result Value Ref Range   WBC 11.3 (H) 4.0 - 10.5 K/uL   RBC 3.29 (L) 3.87 - 5.11 MIL/uL   Hemoglobin 9.4 (L) 12.0 - 15.0 g/dL   HCT 28.2 (L) 36.0 -  46.0 %   MCV 85.7 78.0 - 100.0 fL   MCH 28.6 26.0 - 34.0 pg   MCHC 33.3 30.0 - 36.0 g/dL   RDW 15.4 11.5 - 15.5 %   Platelets 397 150 - 400 K/uL   Neutrophils Relative % 75 43 - 77 %   Neutro Abs 8.5 (H) 1.7 - 7.7 K/uL   Lymphocytes Relative 12 12 - 46 %   Lymphs Abs 1.3 0.7 - 4.0 K/uL   Monocytes Relative 10 3 - 12 %   Monocytes Absolute 1.1 (H) 0.1 - 1.0 K/uL   Eosinophils Relative 3 0 - 5 %   Eosinophils Absolute 0.3 0.0 - 0.7 K/uL   Basophils Relative 0 0 - 1 %   Basophils Absolute 0.1 0.0 - 0.1 K/uL  Basic metabolic panel     Status: Abnormal   Collection Time: 12/04/14  4:55 AM  Result Value Ref Range   Sodium 128 (L) 135 - 145 mmol/L   Potassium 3.6 3.5 - 5.1 mmol/L   Chloride 98 96 - 112 mmol/L   CO2 24 19 - 32 mmol/L   Glucose, Bld 95 70 - 99 mg/dL   BUN 7 6 - 23 mg/dL    Creatinine, Ser 0.33 (L) 0.50 - 1.10 mg/dL   Calcium 7.7 (L) 8.4 - 10.5 mg/dL   GFR calc non Af Amer >90 >90 mL/min   GFR calc Af Amer >90 >90 mL/min    Comment: (NOTE) The eGFR has been calculated using the CKD EPI equation. This calculation has not been validated in all clinical situations. eGFR's persistently <90 mL/min signify possible Chronic Kidney Disease.    Anion gap 6 5 - 15    Imaging / Studies: No results found.  Medications / Allergies:  Scheduled Meds: . sodium chloride   Intravenous Once  . antiseptic oral rinse  7 mL Mouth Rinse q12n4p  . chlorhexidine  15 mL Mouth Rinse BID  . ciprofloxacin  400 mg Intravenous Q12H  . enoxaparin (LOVENOX) injection  40 mg Subcutaneous Q24H  . levothyroxine  50 mcg Intravenous QAC breakfast  . metronidazole  500 mg Intravenous Q8H  . pantoprazole (PROTONIX) IV  40 mg Intravenous QHS  . sodium chloride  10-40 mL Intracatheter Q12H  . white petrolatum       Continuous Infusions: . sodium chloride 50 mL/hr at 12/03/14 1142  . Marland KitchenTPN (CLINIMIX-E) Adult 50 mL/hr at 12/03/14 1756   And  . fat emulsion 240 mL (12/03/14 1756)   PRN Meds:.albuterol, fentaNYL, metoprolol, morphine injection, ondansetron, sodium chloride, zolpidem  Antibiotics: Anti-infectives    Start     Dose/Rate Route Frequency Ordered Stop   11/27/14 1100  cefOXitin (MEFOXIN) 2 g in dextrose 5 % 50 mL IVPB  Status:  Discontinued     2 g 100 mL/hr over 30 Minutes Intravenous To Surgery 11/27/14 1106 11/27/14 1446   11/27/14 0600  cefOXitin (MEFOXIN) 2 g in dextrose 5 % 50 mL IVPB     2 g 100 mL/hr over 30 Minutes Intravenous On call to O.R. 11/26/14 1357 11/27/14 0850   11/22/14 1500  ciprofloxacin (CIPRO) IVPB 400 mg     400 mg 200 mL/hr over 60 Minutes Intravenous Every 12 hours 11/22/14 1447     11/22/14 1500  metroNIDAZOLE (FLAGYL) IVPB 500 mg     500 mg 100 mL/hr over 60 Minutes Intravenous Every 8 hours 11/22/14 1447        Assessment/Plan Diverticulitis with pelvic abscess POD#7 laparoscopic  assisted sigmoid colectomy w/ colostomy---Dr. Brantley Stage Ileus -clamp NGT and give small sips.  If she vomits again, place back to suction and obtain AXR -pulmonary toilet -cipro flagyl D#12/12--stop -SCD/lovenox -consult to [facility] -dressing changes -continue drain care(89m serous) ?ok to remove Leukocytosis -2/2 #1 WBC trending down PCM -TPN, continue rate to 518mhr d/t hyponatremia  COPD HTN -PRN metoprolol Hypothyroidism -IV synthroid Aortic stenosis -monitor for fluid overload, CXR okay Hyponatremia -TPN reduced, continue with NS '@50ml' /hr. -improved to 128 -continue to monitor  EmErby PianANCanyon Pinole Surgery Center LPurgery Pager [phone](7A-4:30P) For consults and floor pages call [phone](7A-4:30P)  12/04/2014 8:28 AM

## 2014-12-04 NOTE — Progress Notes (Signed)
NUTRITION FOLLOW UP  Pt meets criteria for SEVERE MALNUTRITION in the context of acute illness or injury as evidenced by a 11.6% weight loss in 1 month and severe fat and muscle mass loss.  DOCUMENTATION CODES Per approved criteria  -Severe malnutrition in the context of acute illness or injury       Intervention:   -TPN per pharmacy -RD to follow for advancement to PO diet as medically feasible  Nutrition Dx:   Inadequate oral intake related to large bowel obstruction and inability to eat as evidenced by patient report; Ongoing.  Goal:   Intake to meet >90% of estimated nutrition needs; progressing  Monitor:   TPN tolerance/adequacy, ability to start PO diet, weight trend, labs.  Assessment:   [facility] stricture of sigmoid colon. CT revealed pericolonic abscess.  S/p Procedure(s) on 11/27/14: LAPAROSCOPIC SIGMOID COLECTOMY (N/A) COLOSTOMY (N/A)  NGT reinserted on 2/14 due to nausea and vomiting. Pt diet has been downgraded to NPO with sips of clears from the floor. Per MD notes, ileus persists but feels better with NGT.  Pt remains on TPN,  currently receiving Clinimix 5/15-E at 750ml/hr (d/t hyponatremia) with IVFE 7610ml/hr providing 1332kCal and 60gm of protein daily. This provides 78% of estimated kcal needs and 75% of estimated protein needs. Per pharmacy, rate was decreased per MD request due to hyponatremia.  Labs reviewed. Na: 124, Cl: 92, Creat: 0.36, Calcium: 7.7,, Glucose: 124, CBGS WDL. Mg, Phos, and K WDL.   Height: Ht Readings from Last 1 Encounters:  11/22/14 5\' 3"  (1.6 m)    Weight Status:   Wt Readings from Last 1 Encounters:  11/24/14 127 lb 13.9 oz (58 kg)   11/24/14 127 lb 13.9 oz (58 kg)   11/22/14 106 lb (48.081 kg)       Re-estimated needs:  Kcal: 1700-1900 Protein: 80-90 grams Fluid: 1.7-1.9 L  Skin: closed abdominal incision, JP drain, 2 port abdominal incision, LLQ colostomy  Diet Order: TPN  (CLINIMIX-E) Adult TPN (CLINIMIX-E) Adult Diet clear liquid   Intake/Output Summary (Last 24 hours) at 12/04/14 1109 Last data filed at 12/04/14 1057  Gross per 24 hour  Intake 2124.17 ml  Output   2336 ml  Net -211.83 ml    Last BM: 12/03/14   Labs:   Recent Labs Lab 11/29/14 0425 11/30/14 0515  12/02/14 0215 12/03/14 0417 12/04/14 0455  NA 128* 123*  < > 122* 124* 128*  K 3.7 3.5  < > 3.7 3.5 3.6  CL 99 92*  < > 92* 92* 98  CO2 23 28  < > 27 25 24   BUN 10 10  < > 10 8 7   CREATININE 0.40* 0.31*  < > 0.33* 0.36* 0.33*  CALCIUM 7.4* 6.9*  < > 7.5* 7.7* 7.7*  MG 1.9 1.6  --  1.8  --   --   PHOS 1.8* 2.4  --  2.8  --   --   GLUCOSE 114* 98  < > 109* 124* 95  < > = values in this interval not displayed.  CBG (last 3)   Recent Labs  12/02/14 1749  GLUCAP 96    Scheduled Meds: . sodium chloride   Intravenous Once  . antiseptic oral rinse  7 mL Mouth Rinse q12n4p  . chlorhexidine  15 mL Mouth Rinse BID  . ciprofloxacin  400 mg Intravenous Q12H  . enoxaparin (LOVENOX) injection  40 mg Subcutaneous Q24H  . levothyroxine  50 mcg  Intravenous QAC breakfast  . metronidazole  500 mg Intravenous Q8H  . pantoprazole (PROTONIX) IV  40 mg Intravenous QHS  . sodium chloride  10-40 mL Intracatheter Q12H  . white petrolatum        Continuous Infusions: . sodium chloride 50 mL/hr at 12/04/14 1018  . Marland KitchenTPN (CLINIMIX-E) Adult 50 mL/hr at 12/03/14 1756   And  . fat emulsion 240 mL (12/03/14 1756)  . Marland KitchenTPN (CLINIMIX-E) Adult     And  . fat emulsion      April Hull, RD, LDN, CDE Pager: [phone] After hours Pager: [phone]

## 2014-12-04 NOTE — Progress Notes (Signed)
PARENTERAL NUTRITION CONSULT NOTE - FOLLOW UP  Pharmacy Consult for TPN Indication: Large bowel obstruction  Allergies  Allergen Reactions  . Bactrim [Sulfamethoxazole-Trimethoprim] Hives, Itching and Other (See Comments)    Irregular heart beat  . Codeine Palpitations    Dizziness, sweats     Patient Measurements: Height:  (160 cm) Weight: 127 lb 13.9 oz (58 kg) IBW/kg (Calculated) : 52.4 Adjusted Body Weight:  Usual Weight:   Vital Signs: Temp: 98.5 F (36.9 C) (02/17 0447) Temp Source: Oral (02/17 0447) BP: 155/65 mmHg (02/17 0447) Pulse Rate: 86 (02/17 0447) Intake/Output from previous day: 02/16 0701 - 02/17 0700 In: 2124.2 [I.V.:1074.2; IV Piggyback:300; TPN:750] Out: 2336 [Urine:2325; Drains:10; Stool:1] Intake/Output from this shift: Total I/O In: 0  Out: 400 [Urine:400]  Labs:  Recent Labs  12/02/14 0215 12/04/14 0455  WBC 13.5* 11.3*  HGB 9.4* 9.4*  HCT 27.3* 28.2*  PLT 342 397     Recent Labs  12/02/14 0215 12/03/14 0417 12/04/14 0455  NA 122* 124* 128*  K 3.7 3.5 3.6  CL 92* 92* 98  CO2 GLUCOSE 109* 124* 95  BUN CREATININE 0.33* 0.36* 0.33*  CALCIUM 7.5* 7.7* 7.7*  MG 1.8  --   --   PHOS 2.8  --   --   PROT 4.8*  --   --   ALBUMIN 1.8*  --   --   AST 22  --   --   ALT 9  --   --   ALKPHOS 64  --   --   BILITOT 0.3  --   --   PREALBUMIN 17.4*  --   --   TRIG 129  --   --    Estimated Creatinine Clearance: 50.3 mL/min (by C-G formula based on Cr of 0.33).    Recent Labs  12/02/14 1749  GLUCAP 96    Medications:  Scheduled:  . sodium chloride   Intravenous Once  . antiseptic oral rinse  7 mL Mouth Rinse q12n4p  . chlorhexidine  15 mL Mouth Rinse BID  . ciprofloxacin  400 mg Intravenous Q12H  . enoxaparin (LOVENOX) injection  40 mg Subcutaneous Q24H  . levothyroxine  50 mcg Intravenous QAC breakfast  . metronidazole  500 mg Intravenous Q8H  . pantoprazole (PROTONIX) IV  40 mg Intravenous QHS  .  sodium chloride  10-40 mL Intracatheter Q12H  . white petrolatum        Insulin Requirements in the past 24 hours:  None - CBGs discontinued  Current Nutrition:  TPN  Nutritional Goals:  1680-1880 kCal, 75-90 grams of protein per day  Clinimix 5/15-E at 63ml/hr (d/t hyponatremia) with IVFE 22ml/hr providing 1332kCal and 60gm of protein daily, meeting 80% of caloric and protein needs  Admit:  20 YOF admited after having constipation for several weeks and recent colonoscopy revealed stricture of sigmoid colon. CT revealed pericolonic abscess, s/p sigmoid colectomy and drainage of pelvic abscess on 11/27/14. reported "food would not stay down" for ~3 months and has had significant weight loss in the past 1 month. Baseline prealbumin low at 6.3. Patient continues on TPN for nurtional support.  GI: Obstruction radiographically >> s/p sigmoid colectomy with colostomy, SBR of distal ileum, drainage of pelvic abscess on 2/10. Abd soft, NT/ND, (+)BS, (+)N/V, (+)stool in ostomy. NG bilious output,  clamping trial with vomiting overnight. KUB with likely ileus, but poss SBO. Small sips, plan return NG to suction & AXR  if vomits again.  PPI-IV  Endo: Hypothyroid, no history DM - no SSI, serum gluc < 150 Synthroid IV  Lytes: K 3.6, Na 128- improving with dec tpn (per MD) & NS 5850ml/hr  Renal: SCr stable, CrCL 50 ml/min - good UOP 1.7 ml/kg/hr, NS at 50 ml/hr.   Pulm: COPD - RA - Brovana, Pulmicort  Cards: HTN / aortic stenosis - BP mildly elevated, hr wnl. Metoprolol IV prn  Hepatobil: LFTs / tbili / TG remain WNL  Neuro: morphine, fentanyl PRN  ID: Cipro/Flagyl thru 2/17 for intra-abd infxn - afebrile, WBC 11.3- trending down, abscess cx no growth - final  Best Practices: Lovenox, SCDs, mouthcare TPN Access: right basilic double lumen PICC TPN day#: 2/7 >>   Plan:  -Clinimix E 5/15- 50 ml/hr + 20% IVFE at 10 ml/hr.  - Daily multivitamin and trace elements -  TPN labs in AM  Marisue HumbleKendra Walda Hertzog, PharmD Clinical Pharmacist [facility]

## 2014-12-04 NOTE — Progress Notes (Signed)
PT Cancellation Note  Patient Details Name: April BatheShelby J Hull MRN: [id number] DOB: 1941/03/10   Cancelled Treatment:    Reason Eval/Treat Not Completed: Fatigue/lethargy limiting ability to participate. Patient has just finished ambulating with nursing staff and requesting to hold at this time. Will follow up as time allows   Robinette, Adline PotterJulia Elizabeth 12/04/2014, 11:15 AM

## 2014-12-05 LAB — COMPREHENSIVE METABOLIC PANEL
ALK PHOS: 70 U/L (ref 39–117)
ALT: 9 U/L (ref 0–35)
AST: 18 U/L (ref 0–37)
Albumin: 1.9 g/dL — ABNORMAL LOW (ref 3.5–5.2)
Anion gap: 3 — ABNORMAL LOW (ref 5–15)
BILIRUBIN TOTAL: 0.3 mg/dL (ref 0.3–1.2)
BUN: 9 mg/dL (ref 6–23)
CO2: 27 mmol/L (ref 19–32)
Calcium: 7.8 mg/dL — ABNORMAL LOW (ref 8.4–10.5)
Chloride: 100 mmol/L (ref 96–112)
Creatinine, Ser: 0.33 mg/dL — ABNORMAL LOW (ref 0.50–1.10)
GFR calc non Af Amer: >90 mL/min (ref >90)
GLUCOSE: 90 mg/dL (ref 70–99)
POTASSIUM: 3.8 mmol/L (ref 3.5–5.1)
Sodium: 130 mmol/L — ABNORMAL LOW (ref 135–145)
TOTAL PROTEIN: 4.8 g/dL — AB (ref 6.0–8.3)

## 2014-12-05 LAB — MAGNESIUM: MAGNESIUM: 1.9 mg/dL (ref 1.5–2.5)

## 2014-12-05 LAB — PHOSPHORUS: Phosphorus: 2.9 mg/dL (ref 2.3–4.6)

## 2014-12-05 MED ORDER — SENNOSIDES-DOCUSATE SODIUM 8.6-50 MG PO TABS
1.0000 | ORAL_TABLET | Freq: Two times a day (BID) | ORAL | Status: DC
  Administered 2014-12-05 – 2014-12-07 (×5): 1 via ORAL
  Filled 2014-12-05 (×5): qty 1

## 2014-12-05 MED ORDER — FAT EMULSION 20 % IV EMUL
240.0000 mL | INTRAVENOUS | Status: AC
  Administered 2014-12-05: 240 mL via INTRAVENOUS
  Filled 2014-12-05: qty 250

## 2014-12-05 MED ORDER — FAT EMULSION 20 % IV EMUL: 240.0000 mL | INTRAVENOUS | Status: DC

## 2014-12-05 MED ORDER — TRACE MINERALS CR-CU-F-FE-I-MN-MO-SE-ZN IV SOLN
INTRAVENOUS | Status: AC
  Administered 2014-12-05: 17:00:00 via INTRAVENOUS
  Filled 2014-12-05: qty 1200

## 2014-12-05 MED ORDER — TRACE MINERALS CR-CU-F-FE-I-MN-MO-SE-ZN IV SOLN: INTRAVENOUS | Status: DC

## 2014-12-05 NOTE — Progress Notes (Signed)
PARENTERAL NUTRITION CONSULT NOTE - FOLLOW UP  Pharmacy Consult for TPN Indication: Large bowel obstruction  Allergies  Allergen Reactions  . Bactrim [Sulfamethoxazole-Trimethoprim] Hives, Itching and Other (See Comments)    Irregular heart beat  . Codeine Palpitations    Dizziness, sweats     Patient Measurements: Height:  (160 cm) Weight: 127 lb 13.9 oz (58 kg) IBW/kg (Calculated) : 52.4   Vital Signs: Temp: 98.4 F (36.9 C) (02/18 0604) Temp Source: Oral (02/18 0604) BP: 138/68 mmHg (02/18 0604) Pulse Rate: 86 (02/18 0604) Intake/Output from previous day: 02/17 0701 - 02/18 0700 In: 1460 [P.O.:220; I.V.:330; IV Piggyback:300; TPN:610] Out: 795 [Urine:775; Drains:20] Intake/Output from this shift: Total I/O In: 1777.5 [I.V.:657.5; IV Piggyback:400; TPN:720] Out: 150 [Urine:150]  Labs:  Recent Labs  12/04/14 0455  WBC 11.3*  HGB 9.4*  HCT 28.2*  PLT 397     Recent Labs  12/03/14 0417 12/04/14 0455 12/05/14 0540  NA 124* 128* 130*  K 3.5 3.6 3.8  CL 92* 98 100  CO2 GLUCOSE 124* 95 90  BUN CREATININE 0.36* 0.33* 0.33*  CALCIUM 7.7* 7.7* 7.8*  MG  --   --  1.9  PHOS  --   --  2.9  PROT  --   --  4.8*  ALBUMIN  --   --  1.9*  AST  --   --  18  ALT  --   --  9  ALKPHOS  --   --  70  BILITOT  --   --  0.3   Estimated Creatinine Clearance: 50.3 mL/min (by C-G formula based on Cr of 0.33).    Recent Labs  12/02/14 1749  GLUCAP 96    Medications:  Scheduled:  . sodium chloride   Intravenous Once  . antiseptic oral rinse  7 mL Mouth Rinse q12n4p  . chlorhexidine  15 mL Mouth Rinse BID  . enoxaparin (LOVENOX) injection  40 mg Subcutaneous Q24H  . levothyroxine  50 mcg Intravenous QAC breakfast  . pantoprazole (PROTONIX) IV  40 mg Intravenous QHS  . senna-docusate  1 tablet Oral BID  . sodium chloride  10-40 mL Intracatheter Q12H    Insulin Requirements in the past 24 hours:  None - CBGs discontinued  Admit:  10  YOF admited after having constipation for several weeks and recent colonoscopy revealed stricture of sigmoid colon. CT revealed pericolonic abscess, s/p sigmoid colectomy and drainage of pelvic abscess on 11/27/14. reported "food would not stay down" for ~3 months and has had significant weight loss in the past 1 month. Baseline prealbumin low at 6.3. Patient continues on TPN for nurtional support.  GI: Obstruction radiographically >> s/p sigmoid colectomy with colostomy, SBR of distal ileum, drainage of pelvic abscess on 2/10. Abd soft, NT/ND, (+)BS, (+)N/V, little ostomy function. KUB with likely ileus, but poss SBO. Small sips, had intermittent vomiting last night.  PPI-IV  Endo: Hypothyroid, no history DM - no SSI, serum gluc < 150 Synthroid IV  Lytes: K 3.8, Na 130- improving with dec tpn (per MD) & NS 71ml/hr, CorrCa 9.48  Renal: SCr stable, CrCL 50 ml/min - good UOP 0.6 ml/kg/hr, NS at 50 ml/hr.   Pulm: COPD - RA - Brovana, Pulmicort  Cards: HTN / aortic stenosis - VSS. Metoprolol IV prn  Hepatobil: LFTs / tbili / TG remain WNL  Neuro: morphine, fentanyl PRN  ID: Cipro/Flagyl complete on 2/17 for intra-abd infxn - afebrile, WBC  11.3- trending down, abscess cx no growth - final  Best Practices: Lovenox, SCDs, mouthcare TPN Access: right basilic double lumen PICC TPN day#: 2/7 >>   Nutritional Goals:  1680-1880 kCal, 75-90 grams of protein per day  Current Nutrition:  Clinimix 5/15-E at 7750ml/hr (d/t hyponatremia) with IVFE 2610ml/hr providing 1332kCal and 60gm of protein daily, meeting 80% of caloric and protein needs  Plan:  -Continue Clinimix E 5/15 at 50 ml/hr + 20% IVFE at 10 ml/hr to provide 60 gm protein and 1332 kCal to meet 80% of nutritional needs till hyponatremia improves  - Daily multivitamin and trace elements - TPN labs in AM  Vinnie LevelBenjamin Lauria Depoy, PharmD., BCPS Clinical Pharmacist Pager [phone]

## 2014-12-05 NOTE — Progress Notes (Signed)
Patient ID: April Hull, female   DOB: [birth year], 74 y.o.   MRN: [id number]     [facility]      [address] [place], [place] [address]    Phone: [phone] FAX: [phone]     Subjective: Intermittent vomiting.  Stopped at 1am.  Wants coffee.   Mobilizing with therapies and nursing.  VSS.  Afebrile. Little ostomy function.   Objective:  Vital signs:  Filed Vitals:   12/04/14 0447 12/04/14 1355 12/04/14 2058 12/05/14 0604  BP: 155/65 140/67 150/70 138/68  Pulse: 86 88 97 86  Temp: 98.5 F (36.9 C) 98.2 F (36.8 C) 98.3 F (36.8 C) 98.4 F (36.9 C)  TempSrc: Oral Oral Oral Oral  Resp: _0 Height:      Weight:      SpO2: 95% 96% 97% 96%    Last BM Date: 12/04/14  Intake/Output   Yesterday:  02/17 0701 - 02/18 0700 In: 1594 [P.O.:220; I.V.:330; IV Piggyback:300; TPN:610] Out: 795 [Urine:775; Drains:20] This shift:  Total I/O In: 1777.5 [I.V.:657.5; IV Piggyback:400; TPN:720] Out: -   Physical Exam: General: Pt awake/alert/oriented x4 in no acute distress Chest: cta. No chest wall pain w good excursion CV: s1s2 rrr, 3/6 SEM. MS: Normal AROM mjr joints. No obvious deformity Abdomen: Soft. Non distended. +bowel sounds. ttp over incision, beefy red, repacked. JP drain with serosanguinous output. No evidence of peritonitis. No incarcerated hernias. LLQ stoma is pink and viable, solid stool. Ext: SCDs BLE. No mjr edema. 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   Protein-calorie malnutrition, severe  Hyponatremia    Results:   Labs: Results for orders placed or performed during the hospital encounter of 11/22/14 (from the past 48 hour(s))  CBC with Differential/Platelet     Status: Abnormal   Collection Time: 12/04/14  4:55 AM  Result Value Ref Range   WBC 11.3 (H) 4.0 - 10.5 K/uL   RBC 3.29 (L) 3.87 - 5.11 MIL/uL   Hemoglobin 9.4 (L) 12.0 - 15.0 g/dL   HCT 28.2 (L) 36.0 - 46.0 %   MCV 85.7 78.0 - 100.0 fL   MCH 28.6 26.0 - 34.0 pg   MCHC 33.3 30.0 - 36.0 g/dL   RDW 15.4 11.5 - 15.5 %   Platelets 397 150 - 400 K/uL   Neutrophils Relative % 75 43 - 77 %   Neutro Abs 8.5 (H) 1.7 - 7.7 K/uL   Lymphocytes Relative 12 12 - 46 %   Lymphs Abs 1.3 0.7 - 4.0 K/uL   Monocytes Relative 10 3 - 12 %   Monocytes Absolute 1.1 (H) 0.1 - 1.0 K/uL   Eosinophils Relative 3 0 - 5 %   Eosinophils Absolute 0.3 0.0 - 0.7 K/uL   Basophils Relative 0 0 - 1 %   Basophils Absolute 0.1 0.0 - 0.1 K/uL  Basic metabolic panel     Status: Abnormal   Collection Time: 12/04/14  4:55 AM  Result Value Ref Range   Sodium 128 (L) 135 - 145 mmol/L   Potassium 3.6 3.5 - 5.1 mmol/L  Chloride 98 96 - 112 mmol/L   CO2 24 19 - 32 mmol/L   Glucose, Bld 95 70 - 99 mg/dL   BUN 7 6 - 23 mg/dL   Creatinine, Ser 0.33 (L) 0.50 - 1.10 mg/dL   Calcium 7.7 (L) 8.4 - 10.5 mg/dL   GFR calc non Af Amer >90 >90 mL/min   GFR calc Af Amer >90 >90 mL/min    Comment: (NOTE) The eGFR has been calculated using the CKD EPI equation. This calculation has not been validated in all clinical situations. eGFR's persistently <90 mL/min signify possible Chronic Kidney Disease.    Anion gap 6 5 - 15  Comprehensive metabolic panel     Status: Abnormal   Collection Time: 12/05/14  5:40 AM  Result Value Ref Range   Sodium 130 (L) 135 - 145 mmol/L   Potassium 3.8 3.5 - 5.1 mmol/L   Chloride 100 96 - 112 mmol/L   CO2 27 19 - 32 mmol/L   Glucose, Bld 90 70 - 99 mg/dL   BUN 9 6 - 23 mg/dL   Creatinine, Ser 0.33 (L) 0.50 - 1.10  mg/dL   Calcium 7.8 (L) 8.4 - 10.5 mg/dL   Total Protein 4.8 (L) 6.0 - 8.3 g/dL   Albumin 1.9 (L) 3.5 - 5.2 g/dL   AST 18 0 - 37 U/L   ALT 9 0 - 35 U/L   Alkaline Phosphatase 70 39 - 117 U/L   Total Bilirubin 0.3 0.3 - 1.2 mg/dL   GFR calc non Af Amer >90 >90 mL/min   GFR calc Af Amer >90 >90 mL/min    Comment: (NOTE) The eGFR has been calculated using the CKD EPI equation. This calculation has not been validated in all clinical situations. eGFR's persistently <90 mL/min signify possible Chronic Kidney Disease.    Anion gap 3 (L) 5 - 15  Magnesium     Status: None   Collection Time: 12/05/14  5:40 AM  Result Value Ref Range   Magnesium 1.9 1.5 - 2.5 mg/dL  Phosphorus     Status: None   Collection Time: 12/05/14  5:40 AM  Result Value Ref Range   Phosphorus 2.9 2.3 - 4.6 mg/dL    Imaging / Studies: No results found.  Medications / Allergies:  Scheduled Meds: . sodium chloride   Intravenous Once  . antiseptic oral rinse  7 mL Mouth Rinse q12n4p  . chlorhexidine  15 mL Mouth Rinse BID  . ciprofloxacin  400 mg Intravenous Q12H  . enoxaparin (LOVENOX) injection  40 mg Subcutaneous Q24H  . levothyroxine  50 mcg Intravenous QAC breakfast  . metronidazole  500 mg Intravenous Q8H  . pantoprazole (PROTONIX) IV  40 mg Intravenous QHS  . sodium chloride  10-40 mL Intracatheter Q12H   Continuous Infusions: . sodium chloride 50 mL/hr at 12/04/14 1018  . Marland KitchenTPN (CLINIMIX-E) Adult 50 mL/hr at 12/04/14 1750   And  . fat emulsion 240 mL (12/04/14 1750)   PRN Meds:.albuterol, fentaNYL, metoprolol, morphine injection, ondansetron, sodium chloride, zolpidem  Antibiotics: Anti-infectives    Start     Dose/Rate Route Frequency Ordered Stop   11/27/14 1100  cefOXitin (MEFOXIN) 2 g in dextrose 5 % 50 mL IVPB  Status:  Discontinued     2 g 100 mL/hr over 30 Minutes Intravenous To Surgery 11/27/14 1106 11/27/14 1446   11/27/14 0600  cefOXitin (MEFOXIN) 2 g in dextrose 5 % 50 mL IVPB     2  g 100 mL/hr  over 30 Minutes Intravenous On call to O.R. 11/26/14 1357 11/27/14 0850   11/22/14 1500  ciprofloxacin (CIPRO) IVPB 400 mg     400 mg 200 mL/hr over 60 Minutes Intravenous Every 12 hours 11/22/14 1447     11/22/14 1500  metroNIDAZOLE (FLAGYL) IVPB 500 mg     500 mg 100 mL/hr over 60 Minutes Intravenous Every 8 hours 11/22/14 1447         Assessment/Plan Diverticulitis with pelvic abscess POD#8 laparoscopic assisted sigmoid colectomy w/ colostomy---Dr. Brantley Stage Ileus -keep on sips of clears -pulmonary toilet -cipro flagyl D#12/12--stop -SCD/lovenox -WOC following -dressing changes -continue drain care(59m serous) ?ok to remove -add senokot  -will add PO pain meds when able to tolerate PO better Leukocytosis -2/2 #1 WBC trending down PCM -TPN, continue rate to 576mhr d/t hyponatremia  COPD HTN -PRN metoprolol Hypothyroidism -IV synthroid Aortic stenosis -stable Hyponatremia -TPN reduced, continue with NS _0 /hr. -improved to 130 -continue to monitor  Dispo--SNF once tolerating PO  EmErby Hull CeLuis [facility] Pager [phone](7A-4:30P) For consults and floor pages call [phone](7A-4:30P)  12/05/2014 8:07 AM

## 2014-12-05 NOTE — Clinical Social Work Note (Signed)
CSW left message with patient's daughter, [name], regarding discharge planning. CSW to continue to follow and assist with patient's discharge planning needs.  Marcelline Deistmily Isiaha Greenup, LCSWA [phone]) Licensed Clinical Social Worker Orthopedics [phone]([id number]) and Surgical [phone]([id number])

## 2014-12-05 NOTE — Progress Notes (Signed)
Physical Therapy Treatment Patient Details Name: Jake BatheShelby J Rolin MRN: [id number] DOB: Jun 22, 1941 Today's Date: 12/05/2014    History of Present Illness 74 y/o female presents with large bowel obstruction and s/p LAPAROSCOPIC SIGMOID COLECTOMY with COLOSTOMY     PT Comments    Patient is progressing with ambulation and is motivated to progress as she is able. Patient tearful about situation. Patient lives alone and will not have assistance for dressing care or mobility at home. Continue to recommend SNF for ongoing Physical Therapy as patient will need to regain functional independence prior to returning home alone.     Follow Up Recommendations  SNF;Supervision/Assistance - 24 hour     Equipment Recommendations  Rolling walker with 5" wheels    Recommendations for Other Services       Precautions / Restrictions Precautions Precautions: Fall    Mobility  Bed Mobility Overal bed mobility: Modified Independent                Transfers Overall transfer level: Needs assistance     Sit to Stand: Min guard         General transfer comment: Cues for safe technique.  Ambulation/Gait Ambulation/Gait assistance: Min guard Ambulation Distance (Feet): 400 Feet Assistive device: Rolling walker (2 wheeled) Gait Pattern/deviations: Step-through pattern;Decreased stride length   Gait velocity interpretation: Below normal speed for age/gender General Gait Details: Patient guarded but with safe use of the RW   Stairs            Wheelchair Mobility    Modified Rankin (Stroke Patients Only)       Balance                                    Cognition Arousal/Alertness: Awake/alert Behavior During Therapy: WFL for tasks assessed/performed Overall Cognitive Status: Within Functional Limits for tasks assessed                      Exercises      General Comments        Pertinent Vitals/Pain Pain Score: 3  Pain Location:  stoma Pain Descriptors / Indicators: Tender Pain Intervention(s): Monitored during session    Home Living                      Prior Function            PT Goals (current goals can now be found in the care plan section) Progress towards PT goals: Progressing toward goals    Frequency  Min 3X/week    PT Plan Current plan remains appropriate    Co-evaluation             End of Session Equipment Utilized During Treatment: Gait belt Activity Tolerance: Patient tolerated treatment well Patient left: in bed;with call bell/phone within reach     Time: 1256-1319 PT Time Calculation (min) (ACUTE ONLY): 23 min  Charges:  $Gait Training: 8-22 mins $Therapeutic Activity: 8-22 mins                    G Codes:      Fredrich BirksRobinette, Toby Breithaupt Elizabeth 12/05/2014, 2:02 PM  12/05/2014 Fredrich Birksobinette, Charnelle Bergeman Elizabeth PTA [phone] pager [phone] office

## 2014-12-05 NOTE — Consult Note (Addendum)
Stoma type/location: Colostomy from 2/10 to LLQ Stomal assessment/size: Stoma pale red and flush with skin level, 1 1/4 inches Peristomal assessment: Intact skin surrounding, crease located to 9:00 o'clock Output Small amt semi formed stool in pouch Pouching: Applied one piece with barrier ring to maintain seal. Education provided: Demonstrated pouch application and emptying to patient. Pt is able to apply with assistance and open and close velcro to empty independently. Supplies at bedside for staff nurse use. Recommend home health assistance if she does not go to a nursing home after discharge. Discussed pouching routines and ordering supplies. Enrolled patient in [employer] Discharge program: Yes Cammie Mcgeeawn Sibel Khurana MSN, RN, [place]OCN, [place], [place] [phone]

## 2014-12-06 LAB — BASIC METABOLIC PANEL
Anion gap: 5 (ref 5–15)
BUN: 7 mg/dL (ref 6–23)
CALCIUM: 7.8 mg/dL — AB (ref 8.4–10.5)
CO2: 26 mmol/L (ref 19–32)
Chloride: 100 mmol/L (ref 96–112)
Creatinine, Ser: 0.31 mg/dL — ABNORMAL LOW (ref 0.50–1.10)
Glucose, Bld: 99 mg/dL (ref 70–99)
POTASSIUM: 3.9 mmol/L (ref 3.5–5.1)
Sodium: 131 mmol/L — ABNORMAL LOW (ref 135–145)

## 2014-12-06 LAB — MAGNESIUM: Magnesium: 1.9 mg/dL (ref 1.5–2.5)

## 2014-12-06 LAB — PHOSPHORUS: Phosphorus: 2.9 mg/dL (ref 2.3–4.6)

## 2014-12-06 MED ORDER — OXYCODONE-ACETAMINOPHEN 5-325 MG PO TABS
1.0000 | ORAL_TABLET | ORAL | Status: DC | PRN
  Administered 2014-12-06 – 2014-12-08 (×6): 2 via ORAL
  Filled 2014-12-06 (×7): qty 2

## 2014-12-06 MED ORDER — LEVOTHYROXINE SODIUM 100 MCG PO TABS
100.0000 ug | ORAL_TABLET | Freq: Every day | ORAL | Status: DC
  Administered 2014-12-07 – 2014-12-09 (×3): 100 ug via ORAL
  Filled 2014-12-06 (×3): qty 1

## 2014-12-06 MED ORDER — ALBUTEROL SULFATE (2.5 MG/3ML) 0.083% IN NEBU: 3.0000 mL | INHALATION_SOLUTION | Freq: Four times a day (QID) | RESPIRATORY_TRACT | Status: DC | PRN

## 2014-12-06 MED ORDER — LISINOPRIL 10 MG PO TABS
10.0000 mg | ORAL_TABLET | Freq: Every day | ORAL | Status: DC
  Administered 2014-12-06 – 2014-12-09 (×4): 10 mg via ORAL
  Filled 2014-12-06 (×4): qty 1

## 2014-12-06 MED ORDER — MORPHINE SULFATE 2 MG/ML IJ SOLN: 1.0000 mg | INTRAMUSCULAR | Status: DC | PRN

## 2014-12-06 MED ORDER — PANTOPRAZOLE SODIUM 40 MG PO TBEC
40.0000 mg | DELAYED_RELEASE_TABLET | Freq: Every day | ORAL | Status: DC
  Administered 2014-12-06 – 2014-12-09 (×4): 40 mg via ORAL
  Filled 2014-12-06 (×4): qty 1

## 2014-12-06 MED ORDER — FAT EMULSION 20 % IV EMUL
240.0000 mL | INTRAVENOUS | Status: AC
  Administered 2014-12-06: 240 mL via INTRAVENOUS
  Filled 2014-12-06: qty 250

## 2014-12-06 MED ORDER — TRACE MINERALS CR-CU-F-FE-I-MN-MO-SE-ZN IV SOLN
INTRAVENOUS | Status: AC
  Administered 2014-12-06: 18:00:00 via INTRAVENOUS
  Filled 2014-12-06: qty 960

## 2014-12-06 NOTE — Clinical Social Work Note (Signed)
Per PA, patient requesting SN[facility] placement [facility] [place]. CSW has completed appropriate referral to [facility]. CSW to continue to follow and assist with discharge planning needs.  Marcelline Deistmily Cornell Bourbon, LCSWA [phone]) Licensed Clinical Social Worker Orthopedics [phone](5N17-32) and Surgical [phone]([id number])

## 2014-12-06 NOTE — Progress Notes (Signed)
Patient ID: April BatheShelby J Hull, female   DOB: 02-23-1941, 74 y.o.   MRN: [id number] 9 Days Post-Op  Subjective: Pt feels well today.  Tolerating clear liquids with no nausea.  Has gas in her bag.  Objective: Vital signs in last 24 hours: Temp:  [98 F (36.7 C)-98.8 F (37.1 C)] 98 F (36.7 C) (02/19 0550) Pulse Rate:  [84-94] 94 (02/19 0550) Resp:  [17-18] 17 (02/19 0550) BP: (140-170)/(65-81) 148/80 mmHg (02/19 0600) SpO2:  [96 %-98 %] 96 % (02/19 0550) Last BM Date: 12/04/14 (ostomy )  Intake/Output from previous day: 02/18 0701 - 02/19 0700 In: 1777.5 [I.V.:657.5; IV Piggyback:400; TPN:720] Out: 1625 [Urine:1625] Intake/Output this shift: Total I/O In: -  Out: 450 [Urine:450]  PE: Abd: soft, +BS, ostomy full of air, no output otherwise yet.  Incision is clean and packed, JP with minimal output, will dc this  Lab Results:   Recent Labs  12/04/14 0455  WBC 11.3*  HGB 9.4*  HCT 28.2*  PLT 397   BMET  Recent Labs  12/04/14 0455 12/05/14 0540  NA 128* 130*  K 3.6 3.8  CL 98 100  CO2 24 27  GLUCOSE 95 90  BUN 7 9  CREATININE 0.33* 0.33*  CALCIUM 7.7* 7.8*   PT/INR No results for input(s): LABPROT, INR in the last 72 hours. CMP     Component Value Date/Time   NA 130* 12/05/2014 0540   K 3.8 12/05/2014 0540   CL 100 12/05/2014 0540   CO2 27 12/05/2014 0540   GLUCOSE 90 12/05/2014 0540   BUN 9 12/05/2014 0540   CREATININE 0.33* 12/05/2014 0540   CALCIUM 7.8* 12/05/2014 0540   PROT 4.8* 12/05/2014 0540   ALBUMIN 1.9* 12/05/2014 0540   AST 18 12/05/2014 0540   ALT 9 12/05/2014 0540   ALKPHOS 70 12/05/2014 0540   BILITOT 0.3 12/05/2014 0540   GFRNONAA >90 12/05/2014 0540   GFRAA >90 12/05/2014 0540   Lipase  No results found for: LIPASE     Studies/Results: No results found.  Anti-infectives: Anti-infectives    Start     Dose/Rate Route Frequency Ordered Stop   11/27/14 1100  cefOXitin (MEFOXIN) 2 g in dextrose 5 % 50 mL IVPB  Status:   Discontinued     2 g 100 mL/hr over 30 Minutes Intravenous To Surgery 11/27/14 1106 11/27/14 1446   11/27/14 0600  cefOXitin (MEFOXIN) 2 g in dextrose 5 % 50 mL IVPB     2 g 100 mL/hr over 30 Minutes Intravenous On call to O.R. 11/26/14 1357 11/27/14 0850   11/22/14 1500  ciprofloxacin (CIPRO) IVPB 400 mg  Status:  Discontinued     400 mg 200 mL/hr over 60 Minutes Intravenous Every 12 hours 11/22/14 1447 12/05/14 0808   11/22/14 1500  metroNIDAZOLE (FLAGYL) IVPB 500 mg  Status:  Discontinued     500 mg 100 mL/hr over 60 Minutes Intravenous Every 8 hours 11/22/14 1447 12/05/14 0808       Assessment/Plan  Diverticulitis with pelvic abscess POD#9 laparoscopic assisted sigmoid colectomy w/ colostomy---Dr. Luisa Hull Ileus, resolving -advance to full liquids -pulmonary toilet -cipro flagyl completed -SCD/lovenox -WOC following -dressing changes -DC JP drain today -add senokot  -transition to oral pain meds Leukocytosis -improving, abx stopped PCM -TPN, will ask pharmacy to start to wean today COPD -prn albuterol  HTN -resume home lisinopril Hypothyroidism -oral home synthroid dose Aortic stenosis -stable Hyponatremia -TPN reduced, will begin to wean today -improved to 130 -continue to monitor  Dispo--SNF once tolerating PO, hopefully on monday  LOS: 14 days    April Hull E 12/06/2014, 9:52 AM Pager: [phone]

## 2014-12-06 NOTE — Progress Notes (Signed)
PARENTERAL NUTRITION CONSULT NOTE - FOLLOW UP  Pharmacy Consult for TPN Indication: Large bowel obstruction  Allergies  Allergen Reactions  . Bactrim [Sulfamethoxazole-Trimethoprim] Hives, Itching and Other (See Comments)    Irregular heart beat  . Codeine Palpitations    Dizziness, sweats     Patient Measurements: Height:  (160 cm) Weight: 127 lb 13.9 oz (58 kg) IBW/kg (Calculated) : 52.4   Vital Signs: Temp: 98 F (36.7 C) (02/19 0550) Temp Source: Oral (02/19 0550) BP: 148/80 mmHg (02/19 0600) Pulse Rate: 94 (02/19 0550) Intake/Output from previous day: 02/18 0701 - 02/19 0700 In: 1777.5 [I.V.:657.5; IV Piggyback:400; TPN:720] Out: 1625 [Urine:1625] Intake/Output from this shift: Total I/O In: -  Out: 450 [Urine:450]  Labs:  Recent Labs  12/04/14 0455  WBC 11.3*  HGB 9.4*  HCT 28.2*  PLT 397     Recent Labs  12/04/14 0455 12/05/14 0540  NA 128* 130*  K 3.6 3.8  CL 98 100  CO2 24 27  GLUCOSE 95 90  BUN 7 9  CREATININE 0.33* 0.33*  CALCIUM 7.7* 7.8*  MG  --  1.9  PHOS  --  2.9  PROT  --  4.8*  ALBUMIN  --  1.9*  AST  --  18  ALT  --  9  ALKPHOS  --  70  BILITOT  --  0.3   Estimated Creatinine Clearance: 50.3 mL/min (by C-G formula based on Cr of 0.33).   No results for input(s): GLUCAP in the last 72 hours.  Medications:  Scheduled:  . sodium chloride   Intravenous Once  . antiseptic oral rinse  7 mL Mouth Rinse q12n4p  . chlorhexidine  15 mL Mouth Rinse BID  . enoxaparin (LOVENOX) injection  40 mg Subcutaneous Q24H  . levothyroxine  50 mcg Intravenous QAC breakfast  . pantoprazole (PROTONIX) IV  40 mg Intravenous QHS  . senna-docusate  1 tablet Oral BID  . sodium chloride  10-40 mL Intracatheter Q12H    Insulin Requirements in the past 24 hours:  None - CBGs discontinued  Admit:  56 YOF admited after having constipation for several weeks and recent colonoscopy revealed stricture of sigmoid colon. CT revealed pericolonic  abscess, s/p sigmoid colectomy and drainage of pelvic abscess on 11/27/14. reported "food would not stay down" for ~3 months and has had significant weight loss in the past 1 month. Baseline prealbumin low at 6.3. Patient continues on TPN for nurtional support.  GI: Obstruction radiographically >> s/p sigmoid colectomy with colostomy, SBR of distal ileum, drainage of pelvic abscess on 2/10. Abd soft, NT/ND, (+)BS, (+)N/V, little ostomy function. KUB with likely ileus, but poss SBO. Tolerating clear liquids, No nausea. Has gas in her bag. PPI-IV  Endo: Hypothyroid, no history DM - no SSI, serum gluc < 150 Synthroid IV  Lytes: K 3.9, Na 131- improving with dec tpn (per MD) & NS 32ml/hr, CorrCa 9.48  Renal: SCr stable, CrCL 50 ml/min - good UOP 1.2 ml/kg/hr, NS at 50 ml/hr.   Pulm: COPD - RA - Brovana, Pulmicort  Cards: HTN / aortic stenosis - VSS. Metoprolol IV prn  Hepatobil: LFTs / tbili / TG remain WNL  Neuro: morphine, fentanyl PRN  ID: Cipro/Flagyl complete on 2/17 for intra-abd infxn - afebrile, WBC 11.3- trending down, abscess cx no growth - final  Best Practices: Lovenox, SCDs, mouthcare TPN Access: right basilic double lumen PICC TPN day#: 2/7 >>   Nutritional Goals:  1680-1880 kCal, 75-90 grams of protein per  day  Current Nutrition:  Clinimix 5/15-E at 4450ml/hr (d/t hyponatremia) with IVFE 510ml/hr providing 1332kCal and 60gm of protein daily, meeting 80% of caloric and protein needs  Plan:  -Wean Clinimix E 5/15 to 40 ml/hr + 20% IVFE at 10 ml/hr to provide 48 gm protein and 1162 kCal per surgery with plans to possibly d/c TPN soon.  - Advancing to full liquids per surgery.  - Daily multivitamin and trace elements - TPN labs in AM - Plan is to discharge to SNF once tolerating PO, hopefully Monday   Vinnie LevelBenjamin Dewain Platz, PharmD., BCPS Clinical Pharmacist Pager [phone]

## 2014-12-07 LAB — BASIC METABOLIC PANEL
ANION GAP: 4 — AB (ref 5–15)
Anion gap: 3 — ABNORMAL LOW (ref 5–15)
BUN: 7 mg/dL (ref 6–23)
BUN: 6 mg/dL (ref 6–23)
CALCIUM: 7.7 mg/dL — AB (ref 8.4–10.5)
CO2: 27 mmol/L (ref 19–32)
CO2: 28 mmol/L (ref 19–32)
CREATININE: 0.35 mg/dL — AB (ref 0.50–1.10)
CREATININE: 0.32 mg/dL — AB (ref 0.50–1.10)
Calcium: 7.7 mg/dL — ABNORMAL LOW (ref 8.4–10.5)
Chloride: 98 mmol/L (ref 96–112)
Chloride: 100 mmol/L (ref 96–112)
GFR calc Af Amer: >90 mL/min (ref >90)
GFR calc non Af Amer: >90 mL/min (ref >90)
GLUCOSE: 91 mg/dL (ref 70–99)
Glucose, Bld: 97 mg/dL (ref 70–99)
POTASSIUM: 3.7 mmol/L (ref 3.5–5.1)
Potassium: 3.6 mmol/L (ref 3.5–5.1)
SODIUM: 129 mmol/L — AB (ref 135–145)
Sodium: 131 mmol/L — ABNORMAL LOW (ref 135–145)

## 2014-12-07 LAB — PHOSPHORUS: PHOSPHORUS: 3.1 mg/dL (ref 2.3–4.6)

## 2014-12-07 LAB — MAGNESIUM: MAGNESIUM: 1.8 mg/dL (ref 1.5–2.5)

## 2014-12-07 MED ORDER — ENSURE COMPLETE PO LIQD
237.0000 mL | Freq: Two times a day (BID) | ORAL | Status: DC
  Administered 2014-12-07 – 2014-12-09 (×3): 237 mL via ORAL

## 2014-12-07 MED ORDER — POLYETHYLENE GLYCOL 3350 17 G PO PACK
17.0000 g | PACK | Freq: Every day | ORAL | Status: DC
  Administered 2014-12-07: 17 g via ORAL
  Filled 2014-12-07: qty 1

## 2014-12-07 NOTE — Clinical Social Work Note (Signed)
CSW met with patient and provided bed offers. Per patient, she is very independent and strong willed, but realizes she has to "do whatever it takes," to improve her health. Patient states she would like to go home and has friends who could provide supervision assistance during the day, but no one at night. Patient requests CSW speak with her daughter regarding possibility of [facility] at home versus SNF. Patient states although she would rather have it at home, if daughter makes decision for her to go to a SNF, she will go. CSW acknowledged patient's self-determination and desire to remain independent. CSW educated patient on benefits of SNF. Patient states she likes the idea of 24 hour staff to assist her, but does not like to waited on hand and foot. Per patient, she is more comfortable with already having staff in place versus having to secure support to come to her home. CSW to follow with patient's daughter.  [place], [place] Weekend Clinical Social Worker [phone]

## 2014-12-07 NOTE — Progress Notes (Signed)
Patient ID: April Hull, female   DOB: 05-Apr-1941, 74 y.o.   MRN: [id number] 10 Days Post-Op  Subjective: Pt feels well today.  Tolerating full liquids with no nausea.  Still with flatus, but no BM yet  Objective: Vital signs in last 24 hours: Temp:  [97.6 F (36.4 C)-98.9 F (37.2 C)] 98.9 F (37.2 C) (02/20 0634) Pulse Rate:  [77-90] 90 (02/20 0634) Resp:  [17-18] 18 (02/20 0634) BP: (138-156)/(74-82) 156/82 mmHg (02/20 0634) SpO2:  [94 %-98 %] 94 % (02/20 0634) Last BM Date: 12/04/14 (colostomy)  Intake/Output from previous day: 02/19 0701 - 02/20 0700 In: 440 [P.O.:440] Out: 1305 [Urine:1300; Drains:5] Intake/Output this shift:    PE: Abd: soft, midline wound is clean and packed.  Ostomy with good air output, but still with no feculent output.  She has a one-piece ostomy appliance in place, so her stoma was not digitized today.  +BS Heart: regular Lungs: CTAB  Lab Results:  No results for input(s): WBC, HGB, HCT, PLT in the last 72 hours. BMET  Recent Labs  12/07/14 0050 12/07/14 0510  NA 129* 131*  K 3.6 3.7  CL 98 100  CO2 27 28  GLUCOSE 91 97  BUN 7 6  CREATININE 0.35* 0.32*  CALCIUM 7.7* 7.7*   PT/INR No results for input(s): LABPROT, INR in the last 72 hours. CMP     Component Value Date/Time   NA 131* 12/07/2014 0510   K 3.7 12/07/2014 0510   CL 100 12/07/2014 0510   CO2 28 12/07/2014 0510   GLUCOSE 97 12/07/2014 0510   BUN 6 12/07/2014 0510   CREATININE 0.32* 12/07/2014 0510   CALCIUM 7.7* 12/07/2014 0510   PROT 4.8* 12/05/2014 0540   ALBUMIN 1.9* 12/05/2014 0540   AST 18 12/05/2014 0540   ALT 9 12/05/2014 0540   ALKPHOS 70 12/05/2014 0540   BILITOT 0.3 12/05/2014 0540   GFRNONAA >90 12/07/2014 0510   GFRAA >90 12/07/2014 0510   Lipase  No results found for: LIPASE     Studies/Results: No results found.  Anti-infectives: Anti-infectives    Start     Dose/Rate Route Frequency Ordered Stop   11/27/14 1100  cefOXitin (MEFOXIN)  2 g in dextrose 5 % 50 mL IVPB  Status:  Discontinued     2 g 100 mL/hr over 30 Minutes Intravenous To Surgery 11/27/14 1106 11/27/14 1446   11/27/14 0600  cefOXitin (MEFOXIN) 2 g in dextrose 5 % 50 mL IVPB     2 g 100 mL/hr over 30 Minutes Intravenous On call to O.R. 11/26/14 1357 11/27/14 0850   11/22/14 1500  ciprofloxacin (CIPRO) IVPB 400 mg  Status:  Discontinued     400 mg 200 mL/hr over 60 Minutes Intravenous Every 12 hours 11/22/14 1447 12/05/14 0808   11/22/14 1500  metroNIDAZOLE (FLAGYL) IVPB 500 mg  Status:  Discontinued     500 mg 100 mL/hr over 60 Minutes Intravenous Every 8 hours 11/22/14 1447 12/05/14 0808       Assessment/Plan   Diverticulitis with pelvic abscess POD#10 laparoscopic assisted sigmoid colectomy w/ colostomy---Dr. Luisa Hartornett Ileus, resolving -advance to soft diet -pulmonary toilet -cipro flagyl completed -SCD/lovenox -WOC following -dressing changes -add miralax -transition to oral pain meds -if no output from ostomy by tomorrow, will digitize stoma.  Will need appliances in the room for bag change Leukocytosis -improving, abx stopped PCM -TPN, will ask pharmacy to start to DC today.  Add ensure COPD -prn albuterol  HTN -resume home  lisinopril Hypothyroidism -oral home synthroid dose Aortic stenosis -stable Hyponatremia -improved to 131 Dispo--SNF once tolerating PO, hopefully on monday  LOS: 15 days    April Hull 12/07/2014, 8:24 AM Pager: [phone]

## 2014-12-08 NOTE — Progress Notes (Signed)
Patient ID: April Hull, female   DOB: 12/23/1940, 74 y.o.   MRN: [id number] 11 Days Post-Op  Subjective: Pt feels well today.  Starting passing stool in her bag.  Tolerating solid diet well.  Objective: Vital signs in last 24 hours: Temp:  [97.9 F (36.6 C)-98.6 F (37 C)] 97.9 F (36.6 C) (02/21 0646) Pulse Rate:  [86-97] 97 (02/21 0646) Resp:  [18] 18 (02/21 0646) BP: (136-142)/(66-76) 141/66 mmHg (02/21 0646) SpO2:  [93 %-95 %] 95 % (02/21 0646) Last BM Date: 12/07/14  Intake/Output from previous day: 02/20 0701 - 02/21 0700 In: 2150 [P.O.:600; TPN:1550] Out: 300 [Stool:300] Intake/Output this shift:    PE: Abd: soft, appropriately tender, +BS, ostomy full of stool and air.  Wound is clean and packed.    Lab Results:  No results for input(s): WBC, HGB, HCT, PLT in the last 72 hours. BMET  Recent Labs  12/07/14 0050 12/07/14 0510  NA 129* 131*  K 3.6 3.7  CL 98 100  CO2 27 28  GLUCOSE 91 97  BUN 7 6  CREATININE 0.35* 0.32*  CALCIUM 7.7* 7.7*   PT/INR No results for input(s): LABPROT, INR in the last 72 hours. CMP     Component Value Date/Time   NA 131* 12/07/2014 0510   K 3.7 12/07/2014 0510   CL 100 12/07/2014 0510   CO2 28 12/07/2014 0510   GLUCOSE 97 12/07/2014 0510   BUN 6 12/07/2014 0510   CREATININE 0.32* 12/07/2014 0510   CALCIUM 7.7* 12/07/2014 0510   PROT 4.8* 12/05/2014 0540   ALBUMIN 1.9* 12/05/2014 0540   AST 18 12/05/2014 0540   ALT 9 12/05/2014 0540   ALKPHOS 70 12/05/2014 0540   BILITOT 0.3 12/05/2014 0540   GFRNONAA >90 12/07/2014 0510   GFRAA >90 12/07/2014 0510   Lipase  No results found for: LIPASE     Studies/Results: No results found.  Anti-infectives: Anti-infectives    Start     Dose/Rate Route Frequency Ordered Stop   11/27/14 1100  cefOXitin (MEFOXIN) 2 g in dextrose 5 % 50 mL IVPB  Status:  Discontinued     2 g 100 mL/hr over 30 Minutes Intravenous To Surgery 11/27/14 1106 11/27/14 1446   11/27/14 0600   cefOXitin (MEFOXIN) 2 g in dextrose 5 % 50 mL IVPB     2 g 100 mL/hr over 30 Minutes Intravenous On call to O.R. 11/26/14 1357 11/27/14 0850   11/22/14 1500  ciprofloxacin (CIPRO) IVPB 400 mg  Status:  Discontinued     400 mg 200 mL/hr over 60 Minutes Intravenous Every 12 hours 11/22/14 1447 12/05/14 0808   11/22/14 1500  metroNIDAZOLE (FLAGYL) IVPB 500 mg  Status:  Discontinued     500 mg 100 mL/hr over 60 Minutes Intravenous Every 8 hours 11/22/14 1447 12/05/14 0808       Assessment/Plan   Diverticulitis with pelvic abscess POD#11 laparoscopic assisted sigmoid colectomy w/ colostomy---Dr. Luisa Hartornett Ileus, resolving -soft diet -pulmonary toilet -cipro flagyl completed -SCD/lovenox -WOC following -dressing changes -miralax -oral pain meds Leukocytosis -resolved PCM -TNA stopped, tolerating ENsure. -DC PICC, replace PIV only if needed COPD -prn albuterol  HTN -resume home lisinopril Hypothyroidism -oral home synthroid dose Aortic stenosis -stable Hyponatremia -improved to 131 Dispo--SNF hopefully on monday  LOS: 16 days    April Hull E 12/08/2014, 8:12 AM Pager: [phone]

## 2014-12-08 NOTE — Clinical Social Work Note (Signed)
CSW contacted [facility]([place]) and was informed admissions is not available on the weekend. CSW spoke with patient's daughter Bjorn LoserRhonda and provided bed offers. Daughter has chosen [facility]. Daughter states she [place] and would prefer CS[facility] follow-up [facility] [place]. Daughter further states she would like for CSW to follow-up with [facility] and [facility] for bed availability. Per daughter, she is agreeable to patient d/c to [facility] if the [facility], [facility], and [facility] have no availability. CSW made [facility] aware. CSW to follow tomorrow.  Anmol Paschen Patrick-[place], LCSWA Weekend Clinical Social Worker [phone]

## 2014-12-09 MED ORDER — OXYCODONE-ACETAMINOPHEN 5-325 MG PO TABS: 1.0000 | ORAL_TABLET | ORAL | Status: DC | PRN

## 2014-12-09 NOTE — Discharge Summary (Signed)
Patient ID: April Hull MRN: [id number] DOB/AGE: [birth year] 74 y.o.  Admit date: 11/22/2014 Discharge date: 12/09/2014  Procedures:  1. Diagnostic laparoscopy. 2. Open sigmoid colectomy with colostomy 3. Takedown of splenic flexure. 4. Drainage of pelvic abscess. 5. Small bowel resection of distal ileum  Consults: cardiology and pulmonary/intensive care  Reason for Admission:  this is a 74 year old female that was referred from [place], [place] secondary to a colonic stricture. She is accompanied by her daughter. Apparently, she started having bouts of diverticulitis in October 2015. She has been on Antibiotics since thenand is even had admission to the hospital at [facility]. 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 Currently, she has not had a bowel movement in several weeks. She is only keeping down Enure. She reports left lower quadrant abdominal pain.  Admission Diagnoses:  1. Large Bowel obstruction 2. HTN 3. COPD 4. Aortic Stenosis  Hospital Course: The patient was admitted and a CT scan was completed which revealed diffuse colonic wall thickening c/w diverticulitis with a pericolonic abscess and a partial large bowel obstruction.  IR was consulted for placement of a percutaneous drain to try and get the patient better conservatively.  She was on IV abx at this time as well.  Her drain was placed; however, her obstruction did not improve.  Therefore, the decision was made to take her to the operating room for surgical intervention.  She underwent the above document procedure after clearance from cardiology and pulmonary was obtained.  She tolerated the procedure well.  She had an NGT placed intraoperatively.  On POD 2 she had some BS and her NGT was pulled as she wanted it out badly and it had very little output.  However, on POD 4, she was having nausea and  vomiting and this had to be replaced due to post op ileus.  On POD 7, she was passing some flatus.  Her NGT was clamped and she was given sips of clears.  She tolerated this well and her NGT was able to be removed.  Her diet was then able to be advanced as tolerates.  She was initiated on TNA at the beginning of her admission.  This was able to be weaned once she was tolerating her full liquid diet.  Her midline wound was left open after surgery and she was tolerating her WD dressing changes well.  Her ostomy was functioning well with good output at the time of dc as well.  She completed 12 days total of abx therapy.  This was stopped on POD 7.  Hyponatremia - The patient developed hyponatremia post operatively.  Her IVFs were stopped and her fluids were restricted just with her TNA.  Her sodium levels corrected well on their own with this adjustment over the next several days.    Other medical problems (COPD, HTN): all of these conditions remained stable throughout her hospitalization.  She was placed on her oral home meds when appropriately tolerating orals post op.  She was felt stable on POD 12 for Dc to SNF.  PE: Abd: soft, appropriately tender, midline wound is clean and packed.  Ostomy with good feculent output.     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     aspirin 81 MG tablet  Take 81 mg by mouth every other day.     fluticasone 50 MCG/ACT nasal spray  Commonly known as:  FLONASE  USE TWO SPRAY(S) IN EACH NOSTRIL ONCE DAILY     levothyroxine 100 MCG tablet  Commonly known as:  SYNTHROID, LEVOTHROID  Take 100 mcg by mouth daily before breakfast.     lisinopril 10 MG tablet  Commonly known as:  PRINIVIL,ZESTRIL  Take 10 mg by mouth daily.     oxyCODONE-acetaminophen 5-325 MG per tablet  Commonly known as:  PERCOCET/ROXICET  Take 1-2 tablets by mouth every 4 (four) hours as needed for moderate pain.     pantoprazole 40 MG tablet  Commonly known as:  PROTONIX  Take 40 mg by mouth daily.        Discharge Instructions:     Follow-up Information    Follow up with Harriette BouillonORNETT,THOMAS A., MD On 12/30/2014.   Specialty:  General Surgery   Why:  10:40am, arrive no later than 10:10am for paperwork   Contact information:   [address] [place] [address] [phone]       Signed: Letha CapeOSBORNE,Alayah Knouff E 12/09/2014, 9:46 AM

## 2014-12-09 NOTE — Progress Notes (Signed)
Physical Therapy Treatment Patient Details Name: April BatheShelby J Hull MRN: [id number] DOB: 01-08-1941 Today's Date: 12/09/2014    History of Present Illness 74 y/o female presents with large bowel obstruction and s/p LAPAROSCOPIC SIGMOID COLECTOMY with COLOSTOMY     PT Comments    Patient continues to make slow progress with ambulation. Slightly guarded and unsteady due to increased time of immobility. Continue to recommend SNF for ongoing therapy and to increased functional independence prior to returning home alone.   Follow Up Recommendations  SNF;Supervision/Assistance - 24 hour     Equipment Recommendations  Rolling walker with 5" wheels    Recommendations for Other Services       Precautions / Restrictions Precautions Precautions: Fall    Mobility  Bed Mobility                  Transfers Overall transfer level: Needs assistance Equipment used: Rolling walker (2 wheeled) Transfers: Sit to/from Stand Sit to Stand: Supervision         General transfer comment: Cues for safe technique and hand placement. Cues not to pull up from RW  Ambulation/Gait Ambulation/Gait assistance: Supervision Ambulation Distance (Feet): 200 Feet Assistive device: Rolling walker (2 wheeled)     Gait velocity interpretation: Below normal speed for age/gender General Gait Details: Patient guarded but with safe use of the RW. Slow due to insecurity and weakness   Stairs            Wheelchair Mobility    Modified Rankin (Stroke Patients Only)       Balance                                    Cognition Arousal/Alertness: Awake/alert Behavior During Therapy: WFL for tasks assessed/performed Overall Cognitive Status: Within Functional Limits for tasks assessed                      Exercises      General Comments        Pertinent Vitals/Pain Pain Score: 3  Pain Location: stoma Pain Descriptors / Indicators: Tender Pain Intervention(s):  Monitored during session    Home Living                      Prior Function            PT Goals (current goals can now be found in the care plan section) Progress towards PT goals: Progressing toward goals    Frequency  Min 3X/week    PT Plan Current plan remains appropriate    Co-evaluation             End of Session   Activity Tolerance: Patient tolerated treatment well Patient left: in chair;with call bell/phone within reach     Time: 1254-1307 PT Time Calculation (min) (ACUTE ONLY): 13 min  Charges:  $Gait Training: 8-22 mins                    G Codes:      April BirksRobinette, Julia Hull 12/09/2014, 1:16 PM  12/09/2014 April Birksobinette, Julia Hull PTA [phone] pager [phone] office

## 2014-12-09 NOTE — Progress Notes (Signed)
Patient discharged [facility] accompanied by daughter.

## 2014-12-09 NOTE — Discharge Planning (Addendum)
Patient to be [facility]. Patient daughter updated regarding discharge.  [facility]: [id number]  [facility] Report number: [id number] Transportation: Patient's daughter, [name], transporting.  Marcelline Deistmily Samyiah Halvorsen, LCSWA [phone]) Licensed Clinical Social Worker Orthopedics [phone]([id number]) and Surgical [phone]([id number])

## 2014-12-09 NOTE — Consult Note (Signed)
Stoma type/location: Colostomy from 2/10 to LLQ Stomal assessment/size: Stoma red and flush with skin level, 1 1/4 inches Peristomal assessment: Intact skin surrounding, crease located to 9:00 o'clock OutputLarge amt semi-formed brown stool in pouch Pouching: Applied one piece with barrier ring to maintain seal. Education provided: Demonstrated pouch application and emptying to patient. Pt is able to apply with assistance and open and close velcro to empty independently. Extra supplies at bedside; pt is preparing to discharge soon.  Demonstrated use of 2 piece and closed end pouches and free samples given.  Daughter at bedside to review ordering supplies. Discussed pouching routines. Pt denies further questions.  Enrolled [place] in [place] Secure [place] Discharge program: Yes Cammie Mcgeeawn Lexani Corona MSN, RN, [place], [place], [place] [phone]

## 2014-12-09 NOTE — Discharge Instructions (Signed)
[facility]      [facility] °[phone] ° °OPEN ABDOMINAL SURGERY: POST OP INSTRUCTIONS ° °Always review your discharge instruction sheet given to you by the facility where your surgery was performed. ° °IF YOU HAVE DISABILITY OR FAMILY LEAVE FORMS, YOU MUST BRING THEM TO THE OFFICE FOR PROCESSING.  PLEASE DO NOT GIVE THEM TO YOUR DOCTOR. ° °1. A prescription for pain medication may be given to you upon discharge.  Take your pain medication as prescribed, if needed.  If narcotic pain medicine is not needed, then you may take acetaminophen (Tylenol) or ibuprofen (Advil) as needed. °2. Take your usually prescribed medications unless otherwise directed. °3. If you need a refill on your pain medication, please contact your pharmacy. They will contact our office to request authorization.  Prescriptions will not be filled after 5pm or on week-ends. °4. You should follow a light diet the first few days after arrival home, such as soup and crackers, pudding, etc.unless your doctor has advised otherwise. A high-fiber, low fat diet can be resumed as tolerated.   Be sure to include lots of fluids daily. Most patients will experience some swelling and bruising on the chest and neck area.  Ice packs will help.  Swelling and bruising can take several days to resolve °5. Most patients will experience some swelling and bruising in the area of the incision. Ice pack will help. Swelling and bruising can take several days to resolve..  °6. It is common to experience some constipation if taking pain medication after surgery.  Increasing fluid intake and taking a stool softener will usually help or prevent this problem from occurring.  A mild laxative (Milk of Magnesia or Miralax) should be taken according to package directions if there are no bowel movements after 48 hours. °7.  You may have steri-strips (small skin tapes) in place directly over the incision.  These strips should be left on the skin for 7-10 days.  If your  surgeon used skin glue on the incision, you may shower in 24 hours.  The glue will flake off over the next 2-3 weeks.  Any sutures or staples will be removed at the office during your follow-up visit. You may find that a light gauze bandage over your incision may keep your staples from being rubbed or pulled. You may shower and replace the bandage daily. °8. ACTIVITIES:  You may resume regular (light) daily activities beginning the next day--such as daily self-care, walking, climbing stairs--gradually increasing activities as tolerated.  You may have sexual intercourse when it is comfortable.  Refrain from any heavy lifting or straining until approved by your doctor. °a. You may drive when you no longer are taking prescription pain medication, you can comfortably wear a seatbelt, and you can safely maneuver your car and apply brakes °b. Return to Work: ___________________________________ °9. You should see your doctor in the office for a follow-up appointment approximately two weeks after your surgery.  Make sure that you call for this appointment within a day or two after you arrive home to insure a convenient appointment time. °OTHER INSTRUCTIONS:  °_____________________________________________________________ °_____________________________________________________________ ° °WHEN TO CALL YOUR DOCTOR: °1. Fever over 101.0 °2. Inability to urinate °3. Nausea and/or vomiting °4. Extreme swelling or bruising °5. Continued bleeding from incision. °6. Increased pain, redness, or drainage from the incision. °7. Difficulty swallowing or breathing °8. Muscle cramping or spasms. °9. Numbness or tingling in hands or feet or around lips. ° °The clinic staff is available to   answer your questions during regular business hours.  Please don’t hesitate to call and ask to speak to one of the nurses if you have concerns. ° °For further questions, please visit [email or web address] ° °Colostomy Home Guide °A colostomy is an  opening for stool to leave your body when a medical condition prevents it from leaving through the usual opening (rectum). During a surgery, a piece of large intestine (colon) is brought through a hole in the abdominal wall. The new opening is called a stoma or ostomy. A bag or pouch fits over the stoma to catch stool and gas. Your stool may be liquid, somewhat pasty, or formed. °CARING FOR YOUR STOMA  °Normally, the stoma looks a lot like the inside of your cheek: pink, red, and moist. At first it may be swollen, but this swelling will decrease within 6 weeks. °Keep the skin around your stoma clean and dry. You can gently wash your stoma and the skin around your stoma in the shower with a clean, soft washcloth. If you develop any skin irritation, your caregiver may give you a stoma powder or ointment to help heal the area. Do not use any products other than those specifically given to you by your caregiver.  °Your stoma should not be uncomfortable. If you notice any stinging or burning, your pouch may be leaking, and the skin around your stoma may be coming into contact with stool. This can cause skin irritation. If you notice stinging, replace your pouch with a new one and discard the old one. °OSTOMY POUCHES  °The pouch that fits over the ostomy can be made up of either 1 or 2 pieces. A one-piece pouch has a skin barrier piece and the pouch itself in one unit. A two-piece pouch has a skin barrier with a separate pouch that snaps on and off of the skin barrier. Either way, you should empty the pouch when it is only  to ½ full. Do not let more stool or gas build up. This could cause the pouch to leak. °Some ostomy bags have a built-in gas release valve. Ostomy deodorizer (5 drops) can be put into the pouch to prevent odor. Some people use ostomy lubricant drops inside the pouch to help the stool slide out of the bag more easily and completely.  °EMPTYING YOUR OSTOMY POUCH  °You may get lessons on how to empty your  pouch from a wound-ostomy nurse before you leave the hospital. Here are the basic steps: °· Wash your hands with soap and water. °· Sit far back on the toilet. °· Put several pieces of toilet paper into the toilet water. This will prevent splashing as you empty the stool into the toilet bowl. °· Unclip or unvelcro the tail end of the pouch. °· Unroll the tail and empty stool into the toilet. °· Clean the tail with toilet paper. °· Reroll the tail, and clip or velcro it closed. °· Wash your hands again. °CHANGING YOUR OSTOMY POUCH  °Change your ostomy pouch about every 3 to 4 days for the first 6 weeks, then every 5 to7 days. Always change the bag sooner if there is any leakage or you begin to notice any discomfort or irritation of the skin around the stoma. When possible, plan to change your ostomy pouch before eating or drinking as this will lessen the chance of stool coming out during the pouch change. A wound-ostomy nurse may teach you how to change your pouch before you leave the hospital. Here are   the basic steps: °· Lay out your supplies. °· Wash your hands with soap and water. °· Carefully remove the old pouch. °· Wash the stoma and allow it to dry. Men may be advised to shave any hair around the stoma very carefully. This will make the adhesive stick better. °· Use the stoma measuring guide that comes with your pouch set to decide what size hole you will need to cut in the skin barrier piece. Choose the smallest possible size that will hold the stoma but will not touch it. °· Use the guide to trace the circle on the back of the skin barrier piece. Cut out the hole. °· Hold the skin barrier piece over the stoma to make sure the hole is the correct size. °· Remove the adhesive paper backing from the skin barrier piece. °· Squeeze stoma paste around the opening of the skin barrier piece. °· Clean and dry the skin around the stoma again. °· Carefully fit the skin barrier piece over your stoma. °· If you are  using a two-piece pouch, snap the pouch onto the skin barrier piece. °· Close the tail of the pouch. °· Put your hand over the top of the skin barrier piece to help warm it for about 5 minutes, so that it conforms to your body better. °· Wash your hands again. °DIET TIPS  °· Continue to follow your usual diet. °· Drink about eight 8 oz glasses of water each day. °· You can prevent gas by eating slowly and chewing your food thoroughly. °· If you feel concerned that you have too much gas, you can cut back on gas-producing foods, such as: °¨ Spicy foods. °¨ Onions and garlic. °¨ Cruciferous vegetables (cabbage, broccoli, cauliflower, Brussels sprouts). °¨ Beans and legumes. °¨ Some cheeses. °¨ Eggs. °¨ Fish. °¨ Bubbly (carbonated) drinks. °¨ Chewing gum. °GENERAL TIPS  °· You can shower with or without the bag in place. °· Always keep the bag on if you are bathing or swimming. °· If your bag gets wet, you can dry it with a blow-dryer set to cool. °· Avoid wearing tight clothing directly over your stoma so that it does not become irritated or bleed. Tight clothing can also prevent stool from draining into the pouch. °· It is helpful to always have an extra skin barrier and pouch with you when traveling. Do not leave them anywhere too warm, as parts of them can melt. °· Do not let your seat belt rest on your stoma. Try to keep the seat belt either above or below your stoma, or use a tiny pillow to cushion it. °· You can still participate in sports, but you should avoid activities in which there is a risk of getting hit in the abdomen. °· You can still have sex. It is a good idea to empty your pouch prior to sex. Some people and their partners feel very comfortable seeing the pouch during sex. Others choose to wear lingerie or a T-shirt that covers the device. °SEEK IMMEDIATE MEDICAL CARE IF: °· You notice a change in the size or color of the stoma, especially if it becomes very red, purple, black, or pale white. °· You  have bloody stools or bleeding from the stoma. °· You have abdominal pain, nausea, vomiting, or bloating. °· There is anything unusual protruding from the stoma. °· You have irritation or red skin around the stoma. °· No stool is passing from the stoma. °· You have diarrhea (requiring more frequent   than normal pouch emptying). °Document Released: 10/07/2003 Document Revised: 12/27/2011 Document Reviewed: 03/03/2011 °ExitCare® Patient Information ©2015 [employer]. This information is not intended to replace advice given to you by your health care provider. Make sure you discuss any questions you have with your health care provider. ° °Dressing Change, twice a day °A dressing is a material placed over wounds. It keeps the wound clean, dry, and protected from further injury. This provides an environment that favors wound healing.  °BEFORE YOU BEGIN °· Get your supplies together. Things you may need include: °¨ Saline solution. °¨ Flexible gauze dressing. °¨ Medicated cream. °¨ Tape. °¨ Gloves. °¨ Abdominal dressing pads. °¨ Gauze squares. °¨ Plastic bags. °· Take pain medicine 30 minutes before the dressing change if you need it. °· Take a shower before you do the first dressing change of the day. Use plastic wrap or a plastic bag to prevent the dressing from getting wet. °REMOVING YOUR OLD DRESSING  °· Wash your hands with soap and water. Dry your hands with a clean towel. °· Put on your gloves. °· Remove any tape. °· Carefully remove the old dressing. If the dressing sticks, you may dampen it with warm water to loosen it, or follow your caregiver's specific directions. °· Remove any gauze or packing tape that is in your wound. °· Take off your gloves. °· Put the gloves, tape, gauze, or any packing tape into a plastic bag. °CHANGING YOUR DRESSING °· Open the supplies. °· Take the cap off the saline solution. °· Open the gauze package so that the gauze remains on the inside of the package. °· Put on your  gloves. °· Clean your wound as told by your caregiver. °· If you have been told to keep your wound dry, follow those instructions. °· Your caregiver may tell you to do one or more of the following: °¨ Pick up the gauze. Pour the saline solution over the gauze. Squeeze out the extra saline solution. °¨ Put medicated cream or other medicine on your wound if you have been told to do so. °¨ Put the solution soaked gauze only in your wound, not on the skin around it. °¨ Pack your wound loosely or as told by your caregiver. °¨ Put dry gauze on your wound. °¨ Put abdominal dressing pads over the dry gauze if your wet gauze soaks through. °· Tape the abdominal dressing pads in place so they will not fall off. Do not wrap the tape completely around the affected part (arm, leg, abdomen). °· Wrap the dressing pads with a flexible gauze dressing to secure it in place. °· Take off your gloves. Put them in the plastic bag with the old dressing. Tie the bag shut and throw it away. °· Keep the dressing clean and dry until your next dressing change. °· Wash your hands. °SEEK MEDICAL CARE IF: °· Your skin around the wound looks red. °· Your wound feels more tender or sore. °· You see pus in the wound. °· Your wound smells bad. °· You have a fever. °· Your skin around the wound has a rash that itches and burns. °· You see black or yellow skin in your wound that was not there before. °· You feel nauseous, throw up, and feel very tired. °Document Released: 11/11/2004 Document Revised: 12/27/2011 Document Reviewed: 08/16/2011 °ExitCare® Patient Information ©2015 [employer]. This information is not intended to replace advice given to you by your health care provider. Make sure you discuss any questions you   have with your health care provider. ° °

## 2015-02-24 ENCOUNTER — Other Ambulatory Visit: Payer: Self-pay | Admitting: Surgery

## 2015-02-24 ENCOUNTER — Other Ambulatory Visit: Payer: Self-pay | Admitting: *Deleted

## 2015-02-24 ENCOUNTER — Ambulatory Visit: Payer: Self-pay | Admitting: Surgery

## 2015-02-24 DIAGNOSIS — Z933 Colostomy status: Secondary | ICD-10-CM

## 2015-02-24 NOTE — H&P (Signed)
April Hull 02/24/2015 11:04 AM Location: [facility] Patient #: [id number] DOB: [birth year] Widowed / Language: [place] / Race: White Female History of Present Illness Maisie Fus(Iona Stay A. 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 Pt feels great and is ready for colostomy closure. She empties bag 4 times a day and C diff was negative. She has gained weight. No pain ostomy is irritating.  The patient is a 74 year old female   Allergies Gilmer Mor(Sonya Bynum, CMA; 02/24/2015 11:05 AM) Bactrim *ANTI-INFECTIVE AGENTS - MISC.* Codeine Phosphate *ANALGESICS - OPIOID*  Medication History (Sonya Bynum, CMA; 02/24/2015 11:05 AM) ProAir HFA (108 (90 Base)MCG/ACT Aerosol Soln, Inhalation) Active. Aspirin Low Strength (81MG  Tablet Chewable, Oral) Active. Levothyroxine Sodium (100MCG Tablet, Oral) Active. Lisinopril (10MG  Tablet, Oral) Active. Pantoprazole Sodium (40MG  Tablet DR, Oral) Active. Medications Reconciled    Vitals (Sonya Bynum CMA; 02/24/2015 11:05 AM) 02/24/2015 11:04 AM Weight: 111 lb Height: 62in Body Surface Area: 1.48 m Body Mass Index: 20.3 kg/m Temp.: 97.73F(Temporal)  Pulse: 76 (Regular)  BP: 122/80 (Sitting, Left Arm, Standard)     Physical Exam (Allessandra Bernardi A. Dontray Haberland MD; 02/24/2015 11:37 AM)  General Mental Status-Alert. General Appearance-Consistent with stated age. Hydration-Well hydrated. Voice-Normal.  Chest and Lung Exam Chest and lung exam reveals -quiet, even and easy respiratory effort with no use of accessory muscles and on auscultation, normal breath sounds, no adventitious sounds and normal vocal resonance. Inspection Chest Wall - Normal. Back - normal.  Cardiovascular Cardiovascular examination reveals -normal heart sounds, regular rate and rhythm with no murmurs and normal pedal pulses bilaterally.  Abdomen Note: ostomy  pink viable mild skin irritattion incision healed soft NT    Musculoskeletal Normal Exam - Left-Upper Extremity Strength Normal and Lower Extremity Strength Normal. Normal Exam - Right-Upper Extremity Strength Normal and Lower Extremity Strength Normal.    Assessment & Plan (Chanze Teagle A. Shaili Donalson MD; 02/24/2015 11:35 AM)  COLOSTOMY IN PLACE (V44.3  Z93.3) Impression: pt doing well and ready for colostomy closure. She has had 2 colonoscopies in the last 5 years and no report of cancer. Will check BE prior to closure. Risk of laparoscopic colostomy closure include bleeding, infection, leak of anastamosis, death, colostomy, organ injury, kidney injury, ureter injury, bladder injury, SBO, and need for other surgery. Pt agrees to proceed. Non operative options discussed as well. Will bowel preop as wel.  Current Plans Pt Education - [facility] Colon Bowel Prep 2015 Miralax/Antibiotics Started Neomycin Sulfate 500MG , 2 (two) Tablet SEE NOTE, #6, 02/24/2015, No Refill. Local Order: TAKE TWO TABLETS AT 2 PM, 3 PM, AND 10 PM THE DAY PRIOR TO SURGERY Started Flagyl 500MG , 2 (two) Tablet SEE NOTE, #6, 02/24/2015, No Refill. Local Order: Take at 2pm, 3pm, and 10pm the day prior to your colon operation Pt Education - [facility] Bowel Prep Pt Education - [facility] Open Abdominal Surgery [facility]

## 2015-02-26 ENCOUNTER — Other Ambulatory Visit: Payer: Medicare Other

## 2015-02-27 ENCOUNTER — Ambulatory Visit
Admission: RE | Admit: 2015-02-27 | Discharge: 2015-02-27 | Disposition: A | Discharge status: Home / Self Care | Payer: Medicare Other | Source: Ambulatory Visit | Attending: Surgery | Admitting: Surgery

## 2015-02-27 DIAGNOSIS — Z933 Colostomy status: Secondary | ICD-10-CM

## 2015-03-26 ENCOUNTER — Telehealth: Payer: Self-pay | Admitting: Cardiology

## 2015-03-26 NOTE — Telephone Encounter (Signed)
Mrs. Zettie Phoxsom called wanting to speak with Dr. Ival BibleMcDowell's nurse in reference to some upcoming surgery.

## 2015-03-26 NOTE — Telephone Encounter (Signed)
Spoke with patient and she wanted push her f/u appointment with Diona BrownerMcDowell out until her surgeries are completed. Patient advised that we could see her after her surgeries are completed. Patient request appointment information be mailed to her.

## 2015-04-01 ENCOUNTER — Encounter (HOSPITAL_COMMUNITY): Payer: Self-pay

## 2015-04-01 ENCOUNTER — Encounter (HOSPITAL_COMMUNITY)
Admission: RE | Admit: 2015-04-01 | Discharge: 2015-04-01 | Disposition: A | Discharge status: Home / Self Care | Payer: Medicare Other | Source: Ambulatory Visit | Attending: Surgery | Admitting: Surgery

## 2015-04-01 DIAGNOSIS — Z01818 Encounter for other preprocedural examination: Secondary | ICD-10-CM | POA: Diagnosis present

## 2015-04-01 HISTORY — DX: Hypothyroidism, unspecified: E03.9

## 2015-04-01 LAB — CBC WITH DIFFERENTIAL/PLATELET
Basophils Absolute: 0 10*3/uL (ref 0.0–0.1)
Basophils Relative: 0 % (ref 0–1)
Eosinophils Absolute: 0.1 10*3/uL (ref 0.0–0.7)
Eosinophils Relative: 1 % (ref 0–5)
HCT: 37.9 % (ref 36.0–46.0)
Hemoglobin: 12.5 g/dL (ref 12.0–15.0)
LYMPHS ABS: 2.8 10*3/uL (ref 0.7–4.0)
Lymphocytes Relative: 37 % (ref 12–46)
MCH: 28.9 pg (ref 26.0–34.0)
MCHC: 33 g/dL (ref 30.0–36.0)
MCV: 87.7 fL (ref 78.0–100.0)
MONO ABS: 0.6 10*3/uL (ref 0.1–1.0)
Monocytes Relative: 8 % (ref 3–12)
Neutro Abs: 4 10*3/uL (ref 1.7–7.7)
Neutrophils Relative %: 54 % (ref 43–77)
PLATELETS: 243 10*3/uL (ref 150–400)
RBC: 4.32 MIL/uL (ref 3.87–5.11)
RDW: 14.6 % (ref 11.5–15.5)
WBC: 7.5 10*3/uL (ref 4.0–10.5)

## 2015-04-01 LAB — COMPREHENSIVE METABOLIC PANEL
ALK PHOS: 82 U/L (ref 38–126)
ALT: 12 U/L — AB (ref 14–54)
AST: 20 U/L (ref 15–41)
Albumin: 3.7 g/dL (ref 3.5–5.0)
Anion gap: 8 (ref 5–15)
BUN: 10 mg/dL (ref 6–20)
CO2: 27 mmol/L (ref 22–32)
Calcium: 9.6 mg/dL (ref 8.9–10.3)
Chloride: 104 mmol/L (ref 101–111)
Creatinine, Ser: 0.6 mg/dL (ref 0.44–1.00)
GFR calc Af Amer: >60 mL/min (ref >60)
GFR calc non Af Amer: >60 mL/min (ref >60)
GLUCOSE: 92 mg/dL (ref 65–99)
POTASSIUM: 4.3 mmol/L (ref 3.5–5.1)
SODIUM: 139 mmol/L (ref 135–145)
TOTAL PROTEIN: 6.5 g/dL (ref 6.5–8.1)
Total Bilirubin: 0.4 mg/dL (ref 0.3–1.2)

## 2015-04-01 NOTE — Pre-Procedure Instructions (Addendum)
Ulonda Polin Worst  04/01/2015      [facility] [address], [place] - [address] [place] [address] Phone: [phone] Fax: [phone]    Your procedure is scheduled on 04/09/15  Report to [facility] short stay admitting at 630 A.M.  Call this number if you have problems the morning of surgery:  [phone]   Remember:  Do not eat food or drink liquids after midnight.  Take these medicines the morning of surgery with A SIP OF WATER levothyroxine, protonix,inhaler(bring with you)     STOP all herbel meds, nsaids (aleve,naproxen,advil,ibuprofen) 5 days prior to surgery starting 04/04/15 including aspirin ,vitamins   Do not wear jewelry, make-up or nail polish.  Do not wear lotions, powders, or perfumes.  You may wear deodorant.  Do not shave 48 hours prior to surgery.  Men may shave face and neck.  Do not bring valuables to the hospital.  [facility] is not responsible for any belongings or valuables.  Contacts, dentures or bridgework may not be worn into surgery.  Leave your suitcase in the car.  After surgery it may be brought to your room.  For patients admitted to the hospital, discharge time will be determined by your treatment team.  Patients discharged the day of surgery will not be allowed to drive home.   Name and phone number of your driver:    Special instructions:   Special Instructions: [facility] - Preparing for Surgery  Before surgery, you can play an important role.  Because skin is not sterile, your skin needs to be as free of germs as possible.  You can reduce the number of germs on you skin by washing with CHG (chlorahexidine gluconate) soap before surgery.  CHG is an antiseptic cleaner which kills germs and bonds with the skin to continue killing germs even after washing.  Please DO NOT use if you have an allergy to CHG or antibacterial soaps.  If your skin becomes reddened/irritated stop using the CHG and inform your nurse when you  arrive at Short Stay.  Do not shave (including legs and underarms) for at least 48 hours prior to the first CHG shower.  You may shave your face.  Please follow these instructions carefully:   1.  Shower with CHG Soap the night before surgery and the morning of Surgery.  2.  If you choose to wash your hair, wash your hair first as usual with your normal shampoo.  3.  After you shampoo, rinse your hair and body thoroughly to remove the Shampoo.  4.  Use CHG as you would any other liquid soap.  You can apply chg directly  to the skin and wash gently with scrungie or a clean washcloth.  5.  Apply the CHG Soap to your body ONLY FROM THE NECK DOWN.  Do not use on open wounds or open sores.  Avoid contact with your eyes ears, mouth and genitals (private parts).  Wash genitals (private parts)       with your normal soap.  6.  Wash thoroughly, paying special attention to the area where your surgery will be performed.  7.  Thoroughly rinse your body with warm water from the neck down.  8.  DO NOT shower/wash with your normal soap after using and rinsing off the CHG Soap.  9.  Pat yourself dry with a clean towel.            10.  Wear clean pajamas.            11.  Place clean sheets on your bed the night of your first shower and do not sleep with pets.  Day of Surgery  Do not apply any lotions/deodorants the morning of surgery.  Please wear clean clothes to the hospital/surgery center.  Please read over the following fact sheets that you were given. Pain Booklet, Coughing and Deep Breathing and Surgical Site Infection Prevention

## 2015-04-02 NOTE — Progress Notes (Signed)
Anesthesia Chart Review:  Patient is a 74 year old female scheduled for laparoscopic assisted colostomy closure on 04/09/15 by Dr. Luisa Hart.  History includes former smoker, HTN, GERD, moderate AS by 10/2014 echo, HLD, COPD, osteoporosis, hypothyroidism, SOB, prolonged emergence, back surgery, s/p open sigmoid colectomy with colostomy, drainage of pelvic abscess, small bowel resection of distal ileum 11/27/14 for sigmoid diverticulitis with large bowel obstruction and pelvic abscess. PCP is listed as Dr. Doreen Beam.  Primary cardiologist is Dr. Diona Browner, but she was evaluated in-patient by Dr. [place] in 11/2014 for cardiac clearance prior to her colon resection. Primary pulmonologist is Dr. Craige Cotta, but she was seen by Dr. Sung Amabile in-patient 11/2014 for pulmonary clearance prior to her colon resection. She did not have any   11/23/14 EKG: NSR, possible anterior infarct (age undetermined) versus lead placement.   10/23/14 Echo: - Left ventricle: The cavity size was normal. Wall thickness was increased in a pattern of moderate LVH. The estimated ejection fraction was 65%. Wall motion was normal; there were no regional wall motion abnormalities. Doppler parameters are consistent with abnormal left ventricular relaxation (grade 1 diastolic dysfunction). - Aortic valve: The gradients are only mildly elevated. There is significant calcification of the valve. I would call this moderate AS. I am not sure of the anatomy of the valve. Mild AI. Mean gradient (S): 19 mm Hg. Peak gradient (S): 36 mm Hg. Valve area (Vmax): 1.3 cm^2. - Mitral valve: Mild MAC. Mild mitral valve thickening. - Left atrium: The atrium was mildly dilated. - Right ventricle: Tapse suggests some RV dysfunction that is mild. The cavity size was normal.  05/01/13 PFTs: FVC 2.42 (91%), FEV1 1.54 (75%), FEF25-75% 1.88 (41%), DLCO 16.9 (58%).    12/01/14 1V CXR: Elevation right hemidiaphragm. Minimal right basilar  atelectasis. Right upper extremity PICC line tip projects over the superior cavoatrial junction.  Preoperative labs noted.   If no acute changes then I anticipate that she can proceed as planned.  Velna Ochs [facility] Short Stay Center/Anesthesiology Phone [phone] 04/02/2015 1:36 PM

## 2015-04-08 MED ORDER — DEXTROSE 5 % IV SOLN
2.0000 g | INTRAVENOUS | Status: AC
  Administered 2015-04-09: 2 g via INTRAVENOUS
  Filled 2015-04-08 (×2): qty 2

## 2015-04-09 ENCOUNTER — Encounter (HOSPITAL_COMMUNITY)
Admission: RE | Disposition: A | Discharge status: Home Health | Payer: Self-pay | Source: Ambulatory Visit | Attending: Surgery

## 2015-04-09 ENCOUNTER — Ambulatory Visit (HOSPITAL_COMMUNITY): Payer: Medicare Other | Admitting: Vascular Surgery

## 2015-04-09 ENCOUNTER — Encounter (HOSPITAL_COMMUNITY): Payer: Self-pay | Admitting: Certified Registered Nurse Anesthetist

## 2015-04-09 ENCOUNTER — Inpatient Hospital Stay (HOSPITAL_COMMUNITY)
Admission: RE | Admit: 2015-04-09 | Discharge: 2015-04-16 | DRG: 330 | Disposition: A | Discharge status: Home Health | Payer: Medicare Other | Source: Ambulatory Visit | Attending: Surgery | Admitting: Surgery

## 2015-04-09 ENCOUNTER — Ambulatory Visit (HOSPITAL_COMMUNITY): Payer: Medicare Other | Admitting: Certified Registered Nurse Anesthetist

## 2015-04-09 DIAGNOSIS — R05 Cough: Secondary | ICD-10-CM | POA: Diagnosis not present

## 2015-04-09 DIAGNOSIS — R059 Cough, unspecified: Secondary | ICD-10-CM

## 2015-04-09 DIAGNOSIS — Z933 Colostomy status: Secondary | ICD-10-CM

## 2015-04-09 DIAGNOSIS — Z433 Encounter for attention to colostomy: Principal | ICD-10-CM

## 2015-04-09 DIAGNOSIS — K567 Ileus, unspecified: Secondary | ICD-10-CM

## 2015-04-09 DIAGNOSIS — K660 Peritoneal adhesions (postprocedural) (postinfection): Secondary | ICD-10-CM | POA: Diagnosis present

## 2015-04-09 DIAGNOSIS — K632 Fistula of intestine: Secondary | ICD-10-CM | POA: Diagnosis present

## 2015-04-09 DIAGNOSIS — R112 Nausea with vomiting, unspecified: Secondary | ICD-10-CM | POA: Diagnosis not present

## 2015-04-09 DIAGNOSIS — K625 Hemorrhage of anus and rectum: Secondary | ICD-10-CM | POA: Diagnosis not present

## 2015-04-09 HISTORY — PX: COLOSTOMY TAKEDOWN: SHX5258

## 2015-04-09 LAB — CBC
HCT: 35 % — ABNORMAL LOW (ref 36.0–46.0)
Hemoglobin: 11.8 g/dL — ABNORMAL LOW (ref 12.0–15.0)
MCH: 28.8 pg (ref 26.0–34.0)
MCHC: 33.7 g/dL (ref 30.0–36.0)
MCV: 85.4 fL (ref 78.0–100.0)
Platelets: 214 10*3/uL (ref 150–400)
RBC: 4.1 MIL/uL (ref 3.87–5.11)
RDW: 13.9 % (ref 11.5–15.5)
WBC: 12.8 10*3/uL — AB (ref 4.0–10.5)

## 2015-04-09 SURGERY — CLOSURE, COLOSTOMY, LAPAROSCOPIC
Anesthesia: General | Site: Abdomen

## 2015-04-09 MED ORDER — ZOLPIDEM TARTRATE 5 MG PO TABS
5.0000 mg | ORAL_TABLET | Freq: Every evening | ORAL | Status: DC | PRN
  Administered 2015-04-10 – 2015-04-15 (×6): 5 mg via ORAL
  Filled 2015-04-09 (×6): qty 1

## 2015-04-09 MED ORDER — HYDROMORPHONE 0.3 MG/ML IV SOLN
INTRAVENOUS | Status: DC
  Administered 2015-04-09: 0.2 mg via INTRAVENOUS
  Administered 2015-04-09: 0.999 mg via INTRAVENOUS
  Administered 2015-04-09: 1.19 mg via INTRAVENOUS
  Administered 2015-04-09: 14:00:00 via INTRAVENOUS
  Administered 2015-04-10: 0.4 mg via INTRAVENOUS
  Administered 2015-04-10: 0.2 mg via INTRAVENOUS
  Administered 2015-04-10: 0.1999 mg via INTRAVENOUS
  Administered 2015-04-10: 0.2 mg via INTRAVENOUS
  Administered 2015-04-11: 0.799 mg via INTRAVENOUS

## 2015-04-09 MED ORDER — LACTATED RINGERS IV SOLN
INTRAVENOUS | Status: DC
  Administered 2015-04-09 – 2015-04-10 (×2): via INTRAVENOUS

## 2015-04-09 MED ORDER — KETOROLAC TROMETHAMINE 15 MG/ML IJ SOLN
INTRAMUSCULAR | Status: AC
  Filled 2015-04-09: qty 1

## 2015-04-09 MED ORDER — BUPIVACAINE-EPINEPHRINE (PF) 0.25% -1:200000 IJ SOLN
INTRAMUSCULAR | Status: DC | PRN
  Administered 2015-04-09: 30 mL

## 2015-04-09 MED ORDER — LIDOCAINE HCL (CARDIAC) 20 MG/ML IV SOLN
INTRAVENOUS | Status: AC
  Filled 2015-04-09: qty 5

## 2015-04-09 MED ORDER — HYDROMORPHONE HCL 1 MG/ML IJ SOLN
INTRAMUSCULAR | Status: AC
  Filled 2015-04-09: qty 1

## 2015-04-09 MED ORDER — FENTANYL CITRATE (PF) 250 MCG/5ML IJ SOLN
INTRAMUSCULAR | Status: AC
  Filled 2015-04-09: qty 5

## 2015-04-09 MED ORDER — SACCHAROMYCES BOULARDII 250 MG PO CAPS
250.0000 mg | ORAL_CAPSULE | Freq: Two times a day (BID) | ORAL | Status: DC
  Administered 2015-04-09 – 2015-04-16 (×13): 250 mg via ORAL
  Filled 2015-04-09 (×18): qty 1

## 2015-04-09 MED ORDER — DEXAMETHASONE SODIUM PHOSPHATE 4 MG/ML IJ SOLN
INTRAMUSCULAR | Status: AC
  Filled 2015-04-09: qty 2

## 2015-04-09 MED ORDER — DEXTROSE 5 % IV SOLN
2.0000 g | Freq: Two times a day (BID) | INTRAVENOUS | Status: AC
  Administered 2015-04-09: 2 g via INTRAVENOUS
  Filled 2015-04-09 (×2): qty 2

## 2015-04-09 MED ORDER — HYDROMORPHONE HCL 1 MG/ML IJ SOLN
0.2500 mg | INTRAMUSCULAR | Status: DC | PRN
  Administered 2015-04-09 (×3): 0.5 mg via INTRAVENOUS

## 2015-04-09 MED ORDER — LACTATED RINGERS IV SOLN
INTRAVENOUS | Status: DC | PRN
  Administered 2015-04-09 (×3): via INTRAVENOUS

## 2015-04-09 MED ORDER — FLEET ENEMA 7-19 GM/118ML RE ENEM: 1.0000 | ENEMA | Freq: Once | RECTAL | Status: DC

## 2015-04-09 MED ORDER — KETOROLAC TROMETHAMINE 15 MG/ML IJ SOLN
15.0000 mg | Freq: Four times a day (QID) | INTRAMUSCULAR | Status: AC
  Administered 2015-04-09: 15 mg via INTRAVENOUS

## 2015-04-09 MED ORDER — EPHEDRINE SULFATE 50 MG/ML IJ SOLN
INTRAMUSCULAR | Status: AC
  Filled 2015-04-09: qty 1

## 2015-04-09 MED ORDER — ENOXAPARIN SODIUM 40 MG/0.4ML ~~LOC~~ SOLN
40.0000 mg | SUBCUTANEOUS | Status: DC
  Administered 2015-04-10 – 2015-04-16 (×7): 40 mg via SUBCUTANEOUS
  Filled 2015-04-09 (×7): qty 0.4

## 2015-04-09 MED ORDER — FLUTICASONE PROPIONATE 50 MCG/ACT NA SUSP
2.0000 | Freq: Every day | NASAL | Status: DC
Start: 2015-04-09 — End: 2015-04-09

## 2015-04-09 MED ORDER — ONDANSETRON HCL 4 MG/2ML IJ SOLN
INTRAMUSCULAR | Status: AC
  Filled 2015-04-09: qty 2

## 2015-04-09 MED ORDER — NEOSTIGMINE METHYLSULFATE 10 MG/10ML IV SOLN
INTRAVENOUS | Status: AC
  Filled 2015-04-09: qty 4

## 2015-04-09 MED ORDER — DIPHENHYDRAMINE HCL 50 MG/ML IJ SOLN: 12.5000 mg | Freq: Four times a day (QID) | INTRAMUSCULAR | Status: DC | PRN

## 2015-04-09 MED ORDER — SUCCINYLCHOLINE CHLORIDE 20 MG/ML IJ SOLN
INTRAMUSCULAR | Status: AC
Start: 2015-04-09 — End: 2015-04-09
  Filled 2015-04-09: qty 1

## 2015-04-09 MED ORDER — NEOSTIGMINE METHYLSULFATE 10 MG/10ML IV SOLN
INTRAVENOUS | Status: DC | PRN
  Administered 2015-04-09: 3 mg via INTRAVENOUS

## 2015-04-09 MED ORDER — ONDANSETRON HCL 4 MG/2ML IJ SOLN
4.0000 mg | Freq: Four times a day (QID) | INTRAMUSCULAR | Status: DC | PRN
  Administered 2015-04-14: 4 mg via INTRAVENOUS
  Filled 2015-04-09: qty 2

## 2015-04-09 MED ORDER — VECURONIUM BROMIDE 10 MG IV SOLR
INTRAVENOUS | Status: AC
  Filled 2015-04-09: qty 10

## 2015-04-09 MED ORDER — PANTOPRAZOLE SODIUM 40 MG PO TBEC
40.0000 mg | DELAYED_RELEASE_TABLET | Freq: Every day | ORAL | Status: DC
  Administered 2015-04-10 – 2015-04-16 (×7): 40 mg via ORAL
  Filled 2015-04-09 (×7): qty 1

## 2015-04-09 MED ORDER — ONDANSETRON HCL 4 MG/2ML IJ SOLN: 4.0000 mg | Freq: Four times a day (QID) | INTRAMUSCULAR | Status: DC | PRN

## 2015-04-09 MED ORDER — FENTANYL CITRATE (PF) 100 MCG/2ML IJ SOLN
INTRAMUSCULAR | Status: DC | PRN
  Administered 2015-04-09 (×3): 50 ug via INTRAVENOUS
  Administered 2015-04-09: 100 ug via INTRAVENOUS
  Administered 2015-04-09 (×5): 50 ug via INTRAVENOUS

## 2015-04-09 MED ORDER — ALUM & MAG HYDROXIDE-SIMETH 200-200-20 MG/5ML PO SUSP: 30.0000 mL | Freq: Four times a day (QID) | ORAL | Status: DC | PRN

## 2015-04-09 MED ORDER — ONDANSETRON HCL 4 MG PO TABS
4.0000 mg | ORAL_TABLET | Freq: Four times a day (QID) | ORAL | Status: DC | PRN
  Filled 2015-04-09: qty 1

## 2015-04-09 MED ORDER — DIPHENHYDRAMINE HCL 12.5 MG/5ML PO ELIX: 12.5000 mg | ORAL_SOLUTION | Freq: Four times a day (QID) | ORAL | Status: DC | PRN

## 2015-04-09 MED ORDER — DEXTROSE 5 % IV SOLN
INTRAVENOUS | Status: AC
  Filled 2015-04-09: qty 1

## 2015-04-09 MED ORDER — MIDAZOLAM HCL 2 MG/2ML IJ SOLN
INTRAMUSCULAR | Status: AC
  Filled 2015-04-09: qty 2

## 2015-04-09 MED ORDER — MIDAZOLAM HCL 5 MG/5ML IJ SOLN
INTRAMUSCULAR | Status: DC | PRN
  Administered 2015-04-09: 2 mg via INTRAVENOUS

## 2015-04-09 MED ORDER — LISINOPRIL 10 MG PO TABS
10.0000 mg | ORAL_TABLET | Freq: Every day | ORAL | Status: DC
  Administered 2015-04-09 – 2015-04-16 (×8): 10 mg via ORAL
  Filled 2015-04-09 (×8): qty 1

## 2015-04-09 MED ORDER — PROPOFOL 10 MG/ML IV BOLUS
INTRAVENOUS | Status: DC | PRN
  Administered 2015-04-09: 20 mg via INTRAVENOUS
  Administered 2015-04-09: 100 mg via INTRAVENOUS
  Administered 2015-04-09: 20 mg via INTRAVENOUS

## 2015-04-09 MED ORDER — ROCURONIUM BROMIDE 100 MG/10ML IV SOLN
INTRAVENOUS | Status: DC | PRN
  Administered 2015-04-09: 50 mg via INTRAVENOUS

## 2015-04-09 MED ORDER — SODIUM CHLORIDE 0.9 % IJ SOLN: 9.0000 mL | INTRAMUSCULAR | Status: DC | PRN

## 2015-04-09 MED ORDER — LIDOCAINE HCL (CARDIAC) 20 MG/ML IV SOLN
INTRAVENOUS | Status: DC | PRN
  Administered 2015-04-09: 40 mg via INTRAVENOUS

## 2015-04-09 MED ORDER — STERILE WATER FOR INJECTION IJ SOLN
INTRAMUSCULAR | Status: AC
  Filled 2015-04-09: qty 10

## 2015-04-09 MED ORDER — ALBUMIN HUMAN 5 % IV SOLN
INTRAVENOUS | Status: DC | PRN
  Administered 2015-04-09: 11:00:00 via INTRAVENOUS

## 2015-04-09 MED ORDER — DEXAMETHASONE SODIUM PHOSPHATE 4 MG/ML IJ SOLN
INTRAMUSCULAR | Status: DC | PRN
  Administered 2015-04-09: 8 mg via INTRAVENOUS

## 2015-04-09 MED ORDER — VECURONIUM BROMIDE 10 MG IV SOLR
INTRAVENOUS | Status: DC | PRN
  Administered 2015-04-09 (×4): 1 mg via INTRAVENOUS

## 2015-04-09 MED ORDER — NALOXONE HCL 0.4 MG/ML IJ SOLN: 0.4000 mg | INTRAMUSCULAR | Status: DC | PRN

## 2015-04-09 MED ORDER — HYDROMORPHONE 0.3 MG/ML IV SOLN
INTRAVENOUS | Status: AC
  Filled 2015-04-09: qty 25

## 2015-04-09 MED ORDER — ALBUTEROL SULFATE (2.5 MG/3ML) 0.083% IN NEBU: 3.0000 mL | INHALATION_SOLUTION | Freq: Four times a day (QID) | RESPIRATORY_TRACT | Status: DC | PRN

## 2015-04-09 MED ORDER — LEVOTHYROXINE SODIUM 100 MCG PO TABS
100.0000 ug | ORAL_TABLET | Freq: Every day | ORAL | Status: DC
  Administered 2015-04-10 – 2015-04-16 (×7): 100 ug via ORAL
  Filled 2015-04-09 (×3): qty 1
  Filled 2015-04-09: qty 2
  Filled 2015-04-09 (×4): qty 1

## 2015-04-09 MED ORDER — ALVIMOPAN 12 MG PO CAPS: 12.0000 mg | ORAL_CAPSULE | Freq: Two times a day (BID) | ORAL | Status: DC

## 2015-04-09 MED ORDER — BUPIVACAINE-EPINEPHRINE (PF) 0.25% -1:200000 IJ SOLN
INTRAMUSCULAR | Status: AC
  Filled 2015-04-09: qty 30

## 2015-04-09 MED ORDER — SODIUM CHLORIDE 0.9 % IR SOLN
Status: DC | PRN
  Administered 2015-04-09: 1000 mL

## 2015-04-09 MED ORDER — PROPOFOL 10 MG/ML IV BOLUS
INTRAVENOUS | Status: AC
  Filled 2015-04-09: qty 20

## 2015-04-09 MED ORDER — ONDANSETRON HCL 4 MG/2ML IJ SOLN
INTRAMUSCULAR | Status: DC | PRN
  Administered 2015-04-09: 4 mg via INTRAVENOUS

## 2015-04-09 MED ORDER — GLYCOPYRROLATE 0.2 MG/ML IJ SOLN
INTRAMUSCULAR | Status: AC
  Filled 2015-04-09: qty 2

## 2015-04-09 MED ORDER — SODIUM CHLORIDE 0.9 % IR SOLN
Status: DC | PRN
  Administered 2015-04-09 (×3): 1000 mL

## 2015-04-09 MED ORDER — CHLORHEXIDINE GLUCONATE 4 % EX LIQD: 1.0000 "application " | Freq: Once | CUTANEOUS | Status: DC

## 2015-04-09 MED ORDER — PHENYLEPHRINE HCL 10 MG/ML IJ SOLN
10.0000 mg | INTRAVENOUS | Status: DC | PRN
  Administered 2015-04-09: 20 ug/min via INTRAVENOUS

## 2015-04-09 MED ORDER — PHENYLEPHRINE HCL 10 MG/ML IJ SOLN
INTRAMUSCULAR | Status: DC | PRN
  Administered 2015-04-09: 80 ug via INTRAVENOUS
  Administered 2015-04-09 (×2): 40 ug via INTRAVENOUS
  Administered 2015-04-09: 80 ug via INTRAVENOUS
  Administered 2015-04-09: 40 ug via INTRAVENOUS

## 2015-04-09 MED ORDER — ATORVASTATIN CALCIUM 10 MG PO TABS
10.0000 mg | ORAL_TABLET | Freq: Every evening | ORAL | Status: DC
  Administered 2015-04-09 – 2015-04-15 (×7): 10 mg via ORAL
  Filled 2015-04-09 (×7): qty 1

## 2015-04-09 MED ORDER — KETOROLAC TROMETHAMINE 15 MG/ML IJ SOLN
15.0000 mg | Freq: Four times a day (QID) | INTRAMUSCULAR | Status: AC | PRN
  Administered 2015-04-10 – 2015-04-14 (×7): 15 mg via INTRAVENOUS
  Filled 2015-04-09 (×8): qty 1

## 2015-04-09 MED ORDER — GLYCOPYRROLATE 0.2 MG/ML IJ SOLN
INTRAMUSCULAR | Status: DC | PRN
  Administered 2015-04-09: 0.4 mg via INTRAVENOUS

## 2015-04-09 MED ORDER — ROCURONIUM BROMIDE 50 MG/5ML IV SOLN
INTRAVENOUS | Status: AC
  Filled 2015-04-09: qty 1

## 2015-04-09 MED ORDER — ALBUTEROL SULFATE HFA 108 (90 BASE) MCG/ACT IN AERS: 2.0000 | INHALATION_SPRAY | Freq: Four times a day (QID) | RESPIRATORY_TRACT | Status: DC | PRN

## 2015-04-09 SURGICAL SUPPLY — 89 items
APPLIER CLIP ROT 10 11.4 M/L (STAPLE)
APR CLP MED LRG 11.4X10 (STAPLE)
BLADE SURG ROTATE 9660 (MISCELLANEOUS) IMPLANT
CANISTER SUCTION 2500CC (MISCELLANEOUS) ×3 IMPLANT
CELLS DAT CNTRL 66122 CELL SVR (MISCELLANEOUS) IMPLANT
CHLORAPREP W/TINT 26ML (MISCELLANEOUS) ×3 IMPLANT
CLIP APPLIE ROT 10 11.4 M/L (STAPLE) IMPLANT
COVER MAYO STAND STRL (DRAPES) ×3 IMPLANT
COVER SURGICAL LIGHT HANDLE (MISCELLANEOUS) ×3 IMPLANT
DRAIN CHANNEL 19F RND (DRAIN) ×2 IMPLANT
DRAPE PROXIMA HALF (DRAPES) ×9 IMPLANT
DRAPE UTILITY XL STRL (DRAPES) ×3 IMPLANT
DRAPE WARM FLUID 44X44 (DRAPE) ×3 IMPLANT
DRSG OPSITE POSTOP 4X10 (GAUZE/BANDAGES/DRESSINGS) IMPLANT
DRSG OPSITE POSTOP 4X8 (GAUZE/BANDAGES/DRESSINGS) ×3 IMPLANT
DRSG TEGADERM 2-3/8X2-3/4 SM (GAUZE/BANDAGES/DRESSINGS) ×2 IMPLANT
ELECT BLADE 6.5 EXT (BLADE) ×3 IMPLANT
ELECT CAUTERY BLADE 6.4 (BLADE) ×6 IMPLANT
ELECT REM PT RETURN 9FT ADLT (ELECTROSURGICAL) ×3
ELECTRODE REM PT RTRN 9FT ADLT (ELECTROSURGICAL) ×1 IMPLANT
EVACUATOR SILICONE 100CC (DRAIN) ×2 IMPLANT
GEL ULTRASOUND 20GR AQUASONIC (MISCELLANEOUS) ×2 IMPLANT
GLOVE BIO SURGEON STRL SZ 6 (GLOVE) ×6 IMPLANT
GLOVE BIO SURGEON STRL SZ 6.5 (GLOVE) ×2 IMPLANT
GLOVE BIO SURGEON STRL SZ7 (GLOVE) ×12 IMPLANT
GLOVE BIO SURGEON STRL SZ8 (GLOVE) ×8 IMPLANT
GLOVE BIO SURGEONS STRL SZ 6.5 (GLOVE) ×2
GLOVE BIOGEL PI IND STRL 6 (GLOVE) IMPLANT
GLOVE BIOGEL PI IND STRL 6.5 (GLOVE) IMPLANT
GLOVE BIOGEL PI IND STRL 7.0 (GLOVE) IMPLANT
GLOVE BIOGEL PI IND STRL 8 (GLOVE) ×2 IMPLANT
GLOVE BIOGEL PI INDICATOR 6 (GLOVE) ×2
GLOVE BIOGEL PI INDICATOR 6.5 (GLOVE) ×10
GLOVE BIOGEL PI INDICATOR 7.0 (GLOVE) ×6
GLOVE BIOGEL PI INDICATOR 8 (GLOVE) ×16
GOWN STRL REUS W/ TWL LRG LVL3 (GOWN DISPOSABLE) ×3 IMPLANT
GOWN STRL REUS W/ TWL XL LVL3 (GOWN DISPOSABLE) ×3 IMPLANT
GOWN STRL REUS W/TWL 2XL LVL3 (GOWN DISPOSABLE) ×6 IMPLANT
GOWN STRL REUS W/TWL LRG LVL3 (GOWN DISPOSABLE) ×12
GOWN STRL REUS W/TWL XL LVL3 (GOWN DISPOSABLE) ×9
KIT BASIN OR (CUSTOM PROCEDURE TRAY) ×3 IMPLANT
KIT ROOM TURNOVER OR (KITS) ×3 IMPLANT
LEGGING LITHOTOMY PAIR STRL (DRAPES) ×2 IMPLANT
LIGASURE IMPACT 36 18CM CVD LR (INSTRUMENTS) ×2 IMPLANT
NS IRRIG 1000ML POUR BTL (IV SOLUTION) ×6 IMPLANT
PAD ARMBOARD 7.5X6 YLW CONV (MISCELLANEOUS) ×6 IMPLANT
PENCIL BUTTON HOLSTER BLD 10FT (ELECTRODE) ×3 IMPLANT
PENCIL FOOT CONTROL (ELECTRODE) IMPLANT
RELOAD PROXIMATE 75MM BLUE (ENDOMECHANICALS) ×12 IMPLANT
RETRACTOR WND ALEXIS 18 MED (MISCELLANEOUS) IMPLANT
RTRCTR WOUND ALEXIS 18CM MED (MISCELLANEOUS)
SCALPEL HARMONIC ACE (MISCELLANEOUS) IMPLANT
SCISSORS LAP 5X35 DISP (ENDOMECHANICALS) ×3 IMPLANT
SET IRRIG TUBING LAPAROSCOPIC (IRRIGATION / IRRIGATOR) ×3 IMPLANT
SLEEVE ENDOPATH XCEL 5M (ENDOMECHANICALS) ×6 IMPLANT
SPECIMEN JAR MEDIUM (MISCELLANEOUS) IMPLANT
SPECIMEN JAR SMALL (MISCELLANEOUS) ×6 IMPLANT
SPONGE GAUZE 4X4 12PLY STER LF (GAUZE/BANDAGES/DRESSINGS) ×3 IMPLANT
SPONGE LAP 18X18 X RAY DECT (DISPOSABLE) ×6 IMPLANT
STAPLER CIRC ILS CVD 25MM (STAPLE) ×2 IMPLANT
STAPLER GUN LINEAR PROX 60 (STAPLE) ×3 IMPLANT
STAPLER PROXIMATE 75MM BLUE (STAPLE) ×3 IMPLANT
STAPLER TA90 4.8 THK SLIMI (STAPLE) ×2 IMPLANT
STAPLER VISISTAT 35W (STAPLE) ×3 IMPLANT
SURGILUBE 2OZ TUBE FLIPTOP (MISCELLANEOUS) ×2 IMPLANT
SUT ETHILON 3 0 FSL (SUTURE) ×2 IMPLANT
SUT NOVA NAB GS-21 0 18 T12 DT (SUTURE) ×2 IMPLANT
SUT PDS AB 1 TP1 96 (SUTURE) ×4 IMPLANT
SUT PROLENE 2 0 CT2 30 (SUTURE) ×2 IMPLANT
SUT PROLENE 2 0 KS (SUTURE) ×2 IMPLANT
SUT VIC AB 2-0 SH 18 (SUTURE) ×5 IMPLANT
SUT VIC AB 3-0 SH 18 (SUTURE) ×6 IMPLANT
SUT VICRYL AB 2 0 TIES (SUTURE) ×3 IMPLANT
SUT VICRYL AB 3 0 TIES (SUTURE) ×3 IMPLANT
SYR BULB IRRIGATION 50ML (SYRINGE) ×3 IMPLANT
SYS LAPSCP GELPORT 120MM (MISCELLANEOUS)
SYSTEM LAPSCP GELPORT 120MM (MISCELLANEOUS) IMPLANT
TOWEL OR 17X26 10 PK STRL BLUE (TOWEL DISPOSABLE) ×3 IMPLANT
TRAY FOLEY CATH 14FRSI W/METER (CATHETERS) ×3 IMPLANT
TRAY LAPAROSCOPIC (CUSTOM PROCEDURE TRAY) ×3 IMPLANT
TRAY PROCTOSCOPIC FIBER OPTIC (SET/KITS/TRAYS/PACK) ×2 IMPLANT
TROCAR XCEL BLUNT TIP 100MML (ENDOMECHANICALS) IMPLANT
TROCAR XCEL NON-BLD 11X100MML (ENDOMECHANICALS) IMPLANT
TROCAR XCEL NON-BLD 5MMX100MML (ENDOMECHANICALS) ×3 IMPLANT
TUBE CONNECTING 12'X1/4 (SUCTIONS) ×1
TUBE CONNECTING 12X1/4 (SUCTIONS) ×2 IMPLANT
TUBING FILTER THERMOFLATOR (ELECTROSURGICAL) ×3 IMPLANT
TUBING INSUFFLATION (TUBING) ×3 IMPLANT
YANKAUER SUCT BULB TIP NO VENT (SUCTIONS) ×8 IMPLANT

## 2015-04-09 NOTE — Interval H&P Note (Signed)
History and Physical Interval Note:  04/09/2015 8:17 AM  April Hull  has presented today for surgery, with the diagnosis of Colostomy in Place  The various methods of treatment have been discussed with the patient and family. After consideration of risks, benefits and other options for treatment, the patient has consented to  Procedure(s): LAPAROSCOPIC ASSISTED COLOSTOMY CLOSURE (N/A) as a surgical intervention .  The patient's history has been reviewed, patient examined, no change in status, stable for surgery.  I have reviewed the patient's chart and labs.  Questions were answered to the patient's satisfaction.     [name]

## 2015-04-09 NOTE — H&P (Signed)
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 Pt feels great and is ready for colostomy closure. She empties bag 4 times a day and C diff was negative. She has gained weight. No pain ostomy is irritating.  The patient is a 74 year old female   Allergies Gilmer Mor, CMA; 02/24/2015 11:05 AM) Bactrim *ANTI-INFECTIVE AGENTS - MISC.* Codeine Phosphate *ANALGESICS - OPIOID*  Medication History (April Hull, CMA; 02/24/2015 11:05 AM) ProAir HFA (108 (90 Base)MCG/ACT Aerosol Soln, Inhalation) Active. Aspirin Low Strength (81MG  Tablet Chewable, Oral) Active. Levothyroxine Sodium ( Tablet, Oral) Active. Lisinopril (10MG  Tablet, Oral) Active. Pantoprazole Sodium (40MG  Tablet DR, Oral) Active. Medications Reconciled    Vitals (April Hull CMA; 02/24/2015 11:05 AM) 02/24/2015 11:04 AM Weight: 111 lb Height: 62in Body Surface Area: 1.48 m Body Mass Index: 20.3 kg/m Temp.: 97.67F(Temporal)  Pulse: 76 (Regular)  BP: 122/80 (Sitting, Left Arm, Standard)     Physical Exam (April Hull A. Javarius Tsosie MD; 02/24/2015 11:37 AM)  General Mental Status-Alert. General Appearance-Consistent with stated age. Hydration-Well hydrated. Voice-Normal.  Chest and Lung Exam Chest and lung exam reveals -quiet, even and easy respiratory effort with no use of accessory muscles and on auscultation, normal breath sounds, no adventitious sounds and normal vocal  resonance. Inspection Chest Wall - Normal. Back - normal.  Cardiovascular Cardiovascular examination reveals -normal heart sounds, regular rate and rhythm with no murmurs and normal pedal pulses bilaterally.  Abdomen Note: ostomy pink viable mild skin irritattion incision healed soft NT    Musculoskeletal Normal Exam - Left-Upper Extremity Strength Normal and Lower Extremity Strength Normal. Normal Exam - Right-Upper Extremity Strength Normal and Lower Extremity Strength Normal.    Assessment & Plan (April Hull A. Terran Hollenkamp MD; 02/24/2015 11:35 AM)  COLOSTOMY IN PLACE (V44.3  Z93.3) Impression: pt doing well and ready for colostomy closure. She has had 2 colonoscopies in the last 5 years and no report of cancer. Will check BE prior to closure. Risk of laparoscopic colostomy closure include bleeding, infection, leak of anastamosis, death, colostomy, organ injury, kidney injury, ureter injury, bladder injury, SBO, and need for other surgery. Pt agrees to proceed. Non operative options discussed as well. Will bowel preop as wel.  Current Plans Pt Education - [facility] Colon Bowel Prep 2015 Miralax/Antibiotics Started Neomycin Sulfate 500MG , 2 (two) Tablet SEE NOTE, #6, 02/24/2015, No Refill. Local Order: TAKE TWO TABLETS AT 2 PM, 3 PM, AND 10 PM THE DAY PRIOR TO SURGERY Started Flagyl 500MG , 2 (two) Tablet SEE NOTE, #6, 02/24/2015, No Refill. Local Order: Take at 2pm, 3pm, and 10pm the day prior to your colon operation Pt Education - [facility] Bowel Prep Pt Education - [facility] Open Abdominal Surgery [facility]

## 2015-04-09 NOTE — Anesthesia Procedure Notes (Signed)
Procedure Name: Intubation Date/Time: 04/09/2015 8:44 AM Performed by: Rise Patience T Pre-anesthesia Checklist: Patient identified, Emergency Drugs available, Suction available and Patient being monitored Patient Re-evaluated:Patient Re-evaluated prior to inductionOxygen Delivery Method: Circle system utilized Preoxygenation: Pre-oxygenation with 100% oxygen Intubation Type: IV induction Ventilation: Mask ventilation without difficulty Laryngoscope Size: Miller and 2 Grade View: Grade I Tube type: Oral Tube size: 7.5 mm Number of attempts: 1 Airway Equipment and Method: Stylet Placement Confirmation: ETT inserted through vocal cords under direct vision,  positive ETCO2 and breath sounds checked- equal and bilateral Secured at: 21 cm Tube secured with: Tape Dental Injury: Teeth and Oropharynx as per pre-operative assessment

## 2015-04-09 NOTE — Anesthesia Postprocedure Evaluation (Signed)
  Anesthesia Post-op Note  Patient: April Hull  Procedure(s) Performed: Procedure(s): LAPAROSCOPIC ASSISTED COLOSTOMY CLOSURE; RIGID SIGMOIDOSCOPY (N/A)  Patient Location: PACU  Anesthesia Type:General  Level of Consciousness: awake and alert   Airway and Oxygen Therapy: Patient Spontanous Breathing  Post-op Pain: mild  Post-op Assessment: Post-op Vital signs reviewed, Patient's Cardiovascular Status Stable and Respiratory Function Stable  Post-op Vital Signs: Reviewed  Filed Vitals:   04/09/15 1401  BP:   Pulse:   Temp:   Resp: 20    Complications: No apparent anesthesia complications

## 2015-04-09 NOTE — Transfer of Care (Signed)
Immediate Anesthesia Transfer of Care Note  Patient: April Hull  Procedure(s) Performed: Procedure(s): LAPAROSCOPIC ASSISTED COLOSTOMY CLOSURE; RIGID SIGMOIDOSCOPY (N/A)  Patient Location: PACU  Anesthesia Type:General  Level of Consciousness: awake and alert   Airway & Oxygen Therapy: Patient Spontanous Breathing and Patient connected to nasal cannula oxygen  Post-op Assessment: Report given to RN, Post -op Vital signs reviewed and stable and Patient moving all extremities X 4  Post vital signs: Reviewed and stable  Last Vitals:  Filed Vitals:   04/09/15 0706  BP: 140/61  Pulse: 70  Temp: 36.9 C  Resp: 20    Complications: No apparent anesthesia complications

## 2015-04-09 NOTE — Op Note (Signed)
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  Asst.: Dr. Barry Dienes and Dr Grandville Silos MD  Anesthesia: Gen. 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 She was malnourished time it took sometime to recover. She presents to have her colostomy reversed today. Risk for discussed doing the procedure as well as living with her colostomy. Risk of bleeding, infection, anastomotic leak,  pelvic abscess, bowel injury, the need for more surgery, death, DVT, cardiovascular event, pulmonary event, organ failure, with consultations, and the further procedures discussed with the patient. She was to proceed.  Description of procedure: Patient met in holding area and questions answered. She's and taken back to the operating room placed upon the OR table. Both arms were tucked after induction of general endotracheal esthesia and Foley catheter is placed. She's placed lithotomy and padded appropriately. Abdomen was prepped and draped in sterile fashion as well as the perineum. Timeout was done and she received 2 g of cefoxitin. She had a bowel prep preoperatively. 5 mm right lower quadrant port was placed and old scar under direct vision. The abdominal cavity was entered and pneumoperitoneum was created using 15 mmHg of CO2 pressure. Laparoscopy  showed some dense small bowel adhesions to the undersurface of the old incision. The colostomy was intact but she had ample working room. 2 other 5 mm ports are placed one the patient's right upper quadrant and right lower quadrant. We took down the anterior abdominal wall adhesions. We mobilized the left colon as well further. At this point elected to place a hand port the old scar which I did. The GelPort was placed by making an incision through the old scar and inserted in the. Cavity and re-insufflating. She had dense small bowel adhesions to the rectal stump which appeared to be the terminal ileum point the cecum down toward the rectal stump. I cannot bluntly lyse these. Sharp dissection was very difficult due to its location. The remainder of the case we performed opened by removing the GelPort placing retractors feel incision. The small bowel was tethered down to the rectal stump there appeared to be a fistula from the terminal ileum almost at the ileocecal valve down to this. I took this down using sharp dissection. I resected the terminal ileum and proximal cecum since this was so close to the ileocecal valve. This was done using GIA stapling devices. We created a side-to-side anastomosis of the ileum to the ascending colon using a GIA-75 stapler device. A TA 90 was used to close the common enterotomy and it took 2 firings of this to resect the cecum and the terminal ileum. Anastomosis appeared intact without any tension. Mesenteric defect closed with 3-0 Vicryl. Next reexamine the rectal stump. There is a opening from the fistula to close this with 3-0 Vicryl. We began to mobilize the rectal stump was densely adhered the pelvis this was quite difficult. I was able to identify  the left ureter and right ureter and preserve these. The superior hemorrhoidal vessels were taken down to further mobilize rectal stump with a LigaSure. At this point the colostomy was taken down. The anterior abdominal wall using  cautery. There is ample length to reach into the pelvis. Resume sigmoidoscopy is performed and there is a leakage from the opening in the rectum. I tried to close it closes initially with Vicryl but he continued to leak. Out to remove sutures and we were able to pass a dilator easily up the rectum. This is a 25 EEA size dilator since her colon was small. A pursestring applicator was placed the proximal valve and a 2-0 Prolene was passed through it. The end of the colostomy was excised in a 25 EEA anvil was placed. Pursestring suture was tied down. At this point they state device was passed per rectum and the spike was deployed through the opening already present in the rectum. The colon was attached to the stapling device and was not twisted. This was closed down but he sure nothing was caught and was fired. 2 complete donuts were noted. There was a leakage in the posterior aspect of the rectal stump at the anastomosis. I oversewed this with 2-0 Vicryl. We then we examined the anastomosis by insufflating air with the rigid sigmoidoscope and clamping the proximal bowel. There was  no further leakage of air with distention of the anastomosis. A drain was placed through the mid right abdominal port site into the pelvis. I opted not to divert  since there was no continued air leak and there was  no tension at the anastomosis and her tissues were healthy. Irrigation was used and suctioned out. Small bowel was examined from the anastomosis always the ligament of Treitz was not twisted nor was there any evidence of injury elsewhere. The colon was reexamined and I placed 4 sutures at the anastomosis to reinforce this. Hemostasis was achieved. The drapes and gowns were changed Pro: Protocol. The ostomy site was closed with #1 Novafil. This was packed with saline soaked Kerlix. Midline incision closed with #1 PDS looped. Staples used to close the skin. Dry dressings applied. Patient tolerated procedure well. Patient taken to  lithotomy, extubated taken recovery in satisfactory condition. All final counts found to be correct.

## 2015-04-09 NOTE — Anesthesia Preprocedure Evaluation (Addendum)
Anesthesia Evaluation  Patient identified by MRN, date of birth, ID band Patient awake    Reviewed: Allergy & Precautions, H&P , NPO status , Patient's Chart, lab work & pertinent test results  Airway Mallampati: II  TM Distance: >3 FB Neck ROM: Full    Dental no notable dental hx. (+) Dental Advisory Given, Edentulous Upper, Edentulous Lower   Pulmonary shortness of breath and with exertion, COPD COPD inhaler, former smoker,  breath sounds clear to auscultation  Pulmonary exam normal       Cardiovascular hypertension, Pt. on medications + Valvular Problems/Murmurs AS Rhythm:Regular Rate:Normal + Systolic murmurs    Neuro/Psych negative neurological ROS  negative psych ROS   GI/Hepatic Neg liver ROS, GERD-  Medicated,  Endo/Other  Hypothyroidism   Renal/GU negative Renal ROS  negative genitourinary   Musculoskeletal  (+) Arthritis -, Osteoarthritis,    Abdominal   Peds  Hematology negative hematology ROS (+)   Anesthesia Other Findings   Reproductive/Obstetrics negative OB ROS                            Anesthesia Physical Anesthesia Plan  ASA: III  Anesthesia Plan: General   Post-op Pain Management:    Induction: Intravenous  Airway Management Planned: Oral ETT  Additional Equipment: Arterial line  Intra-op Plan:   Post-operative Plan: Extubation in OR  Informed Consent: I have reviewed the patients History and Physical, chart, labs and discussed the procedure including the risks, benefits and alternatives for the proposed anesthesia with the patient or authorized representative who has indicated his/her understanding and acceptance.   Dental advisory given  Plan Discussed with: Anesthesiologist, Surgeon and CRNA  Anesthesia Plan Comments:        Anesthesia Quick Evaluation

## 2015-04-10 ENCOUNTER — Encounter (HOSPITAL_COMMUNITY): Payer: Self-pay | Admitting: Surgery

## 2015-04-10 LAB — BASIC METABOLIC PANEL
Anion gap: 8 (ref 5–15)
BUN: 7 mg/dL (ref 6–20)
CO2: 26 mmol/L (ref 22–32)
Calcium: 7.9 mg/dL — ABNORMAL LOW (ref 8.9–10.3)
Chloride: 98 mmol/L — ABNORMAL LOW (ref 101–111)
Creatinine, Ser: 0.58 mg/dL (ref 0.44–1.00)
Glucose, Bld: 99 mg/dL (ref 65–99)
POTASSIUM: 3.6 mmol/L (ref 3.5–5.1)
SODIUM: 132 mmol/L — AB (ref 135–145)

## 2015-04-10 LAB — CBC
HCT: 30.3 % — ABNORMAL LOW (ref 36.0–46.0)
Hemoglobin: 10.3 g/dL — ABNORMAL LOW (ref 12.0–15.0)
MCH: 29.1 pg (ref 26.0–34.0)
MCHC: 34 g/dL (ref 30.0–36.0)
MCV: 85.6 fL (ref 78.0–100.0)
PLATELETS: 215 10*3/uL (ref 150–400)
RBC: 3.54 MIL/uL — ABNORMAL LOW (ref 3.87–5.11)
RDW: 14 % (ref 11.5–15.5)
WBC: 11.4 10*3/uL — ABNORMAL HIGH (ref 4.0–10.5)

## 2015-04-10 MED ORDER — KCL IN DEXTROSE-NACL 20-5-0.9 MEQ/L-%-% IV SOLN
INTRAVENOUS | Status: DC
  Administered 2015-04-10 – 2015-04-12 (×5): via INTRAVENOUS
  Administered 2015-04-13: 1 mL via INTRAVENOUS
  Administered 2015-04-14: 15:00:00 via INTRAVENOUS
  Administered 2015-04-14: 1 mL via INTRAVENOUS
  Filled 2015-04-10 (×16): qty 1000

## 2015-04-10 NOTE — Progress Notes (Signed)
Utilization review complete. Isabell Bonafede RN CCM Case Mgmt phone [phone] 

## 2015-04-10 NOTE — Progress Notes (Signed)
1 Day Post-Op  Subjective: PT HAD PAIN OVERNIGHT  LOOKS WELL   Objective: Vital signs in last 24 hours: Temp:  [97.5 F (36.4 C)-98.6 F (37 C)] 98.6 F (37 C) (06/23 0512) Pulse Rate:  [70-98] 79 (06/23 0512) Resp:  [10-25] 20 (06/23 0512) BP: (100-140)/(48-67) 133/56 mmHg (06/23 0512) SpO2:  [92 %-100 %] 100 % (06/23 0512) Arterial Line BP: (77-148)/(37-66) 139/58 mmHg (06/22 1600) FiO2 (%):  [96 %-97 %] 97 % (06/23 0512) Weight:  [48.988 kg (108 lb)] 48.988 kg (108 lb) (06/22 0706) Last BM Date: 04/09/15  Intake/Output from previous day: 06/22 0701 - 06/23 0700 In: 2610 [P.O.:60; I.V.:2300; IV Piggyback:250] Out: 1550 [Urine:1200; Drains:150; Blood:200] Intake/Output this shift:    General appearance: alert and cooperative Resp: clear to auscultation bilaterally Cardio: regular rate and rhythm, S1, S2 normal, no murmur, click, rub or gallop Incision/Wound:clean OSTOMY SITE PACKED BS present honeycomb dressing in place    Lab Results:   Recent Labs  04/09/15 1652 04/10/15 0316  WBC 12.8* 11.4*  HGB 11.8* 10.3*  HCT 35.0* 30.3*  PLT 214 215   BMET  Recent Labs  04/10/15 0316  NA 132*  K 3.6  CL 98*  CO2 26  GLUCOSE 99  BUN 7  CREATININE 0.58  CALCIUM 7.9*   PT/INR No results for input(s): LABPROT, INR in the last 72 hours. ABG No results for input(s): PHART, HCO3 in the last 72 hours.  Invalid input(s): PCO2, PO2  Studies/Results: No results found.  Anti-infectives: Anti-infectives    Start     Dose/Rate Route Frequency Ordered Stop   04/09/15 2200  cefoTEtan (CEFOTAN) 2 g in dextrose 5 % 50 mL IVPB     2 g 100 mL/hr over 30 Minutes Intravenous Every 12 hours 04/09/15 1635 04/09/15 2337   04/09/15 0800  cefOXitin (MEFOXIN) 2 g in dextrose 5 % 50 mL IVPB     2 g 100 mL/hr over 30 Minutes Intravenous To Surgery 04/08/15 1450 04/09/15 0855   04/09/15 0649  dextrose 5 % with cefOXitin (MEFOXIN) ADS Med  Status:  Discontinued    Comments:   Marrianne Mood   : cabinet override      04/09/15 0649 04/09/15 0703      Assessment/Plan: s/p Procedure(s): LAPAROSCOPIC ASSISTED COLOSTOMY CLOSURE; RIGID SIGMOIDOSCOPY (N/A) CECECTOMY AND SBR  Leave on ice until flatus Discussed operative findings and risk of leak Looks good overall Change IVF  OOB     LOS: 1 day    Addisson Frate A. 04/10/2015

## 2015-04-11 LAB — CBC
HCT: 26 % — ABNORMAL LOW (ref 36.0–46.0)
HEMOGLOBIN: 8.5 g/dL — AB (ref 12.0–15.0)
MCH: 28.7 pg (ref 26.0–34.0)
MCHC: 32.7 g/dL (ref 30.0–36.0)
MCV: 87.8 fL (ref 78.0–100.0)
PLATELETS: 168 10*3/uL (ref 150–400)
RBC: 2.96 MIL/uL — AB (ref 3.87–5.11)
RDW: 14.9 % (ref 11.5–15.5)
WBC: 8.5 10*3/uL (ref 4.0–10.5)

## 2015-04-11 LAB — BASIC METABOLIC PANEL
ANION GAP: 4 — AB (ref 5–15)
CO2: 27 mmol/L (ref 22–32)
Calcium: 7.7 mg/dL — ABNORMAL LOW (ref 8.9–10.3)
Chloride: 105 mmol/L (ref 101–111)
Creatinine, Ser: 0.45 mg/dL (ref 0.44–1.00)
GFR calc Af Amer: >60 mL/min (ref >60)
Glucose, Bld: 115 mg/dL — ABNORMAL HIGH (ref 65–99)
Potassium: 3.7 mmol/L (ref 3.5–5.1)
SODIUM: 136 mmol/L (ref 135–145)

## 2015-04-11 MED ORDER — HYDROMORPHONE HCL 1 MG/ML IJ SOLN
1.0000 mg | INTRAMUSCULAR | Status: DC | PRN
  Administered 2015-04-12: 1 mg via INTRAVENOUS
  Filled 2015-04-11: qty 1

## 2015-04-11 NOTE — Progress Notes (Deleted)
Peg teaching done that includes med administration, feeding and peg site care. Patient verbalizes teaching.

## 2015-04-11 NOTE — Progress Notes (Signed)
2 Days Post-Op  Subjective: Not much pain wants to get rid of PCA had a bloody BM last night no vomiting   Objective: Vital signs in last 24 hours: Temp:  [97.7 F (36.5 C)-99 F (37.2 C)] 99 F (37.2 C) (06/24 0439) Pulse Rate:  [78-107] 107 (06/24 0439) Resp:  [17-23] 20 (06/24 0439) BP: (98-146)/(49-62) 146/62 mmHg (06/24 0439) SpO2:  [93 %-97 %] 96 % (06/24 0439) Last BM Date: 04/09/15  Intake/Output from previous day: 06/23 0701 - 06/24 0700 In: 2085 [I.V.:2065] Out: 80 [Drains:80] Intake/Output this shift: Total I/O In: -  Out: 30 [Drains:30]  General appearance: alert and cooperative Resp: clear to auscultation bilaterally Cardio: slight tachycardia Incision/Wound:wound clean  Soft abdomen BS present   Wound honeycomb CDI ostomy  Site open clean  JP serous   Lab Results:   Recent Labs  04/09/15 1652 04/10/15 0316  WBC 12.8* 11.4*  HGB 11.8* 10.3*  HCT 35.0* 30.3*  PLT 214 215   BMET  Recent Labs  04/10/15 0316  NA 132*  K 3.6  CL 98*  CO2 26  GLUCOSE 99  BUN 7  CREATININE 0.58  CALCIUM 7.9*   PT/INR No results for input(s): LABPROT, INR in the last 72 hours. ABG No results for input(s): PHART, HCO3 in the last 72 hours.  Invalid input(s): PCO2, PO2  Studies/Results: No results found.  Anti-infectives: Anti-infectives    Start     Dose/Rate Route Frequency Ordered Stop   04/09/15 2200  cefoTEtan (CEFOTAN) 2 g in dextrose 5 % 50 mL IVPB     2 g 100 mL/hr over 30 Minutes Intravenous Every 12 hours 04/09/15 1635 04/09/15 2337   04/09/15 0800  cefOXitin (MEFOXIN) 2 g in dextrose 5 % 50 mL IVPB     2 g 100 mL/hr over 30 Minutes Intravenous To Surgery 04/08/15 1450 04/09/15 0855   04/09/15 0649  dextrose 5 % with cefOXitin (MEFOXIN) ADS Med  Status:  Discontinued    Comments:  Marrianne Mood   : cabinet override      04/09/15 0649 04/09/15 0703      Assessment/Plan: s/p Procedure(s): LAPAROSCOPIC ASSISTED COLOSTOMY CLOSURE; RIGID  SIGMOIDOSCOPY (N/A) Passed some blood per rectum  And had a small stool Will check CBC today Will cut back IVF OOB will start clears   LOS: 2 days    April Hull A. 04/11/2015

## 2015-04-12 ENCOUNTER — Inpatient Hospital Stay (HOSPITAL_COMMUNITY): Payer: Medicare Other

## 2015-04-12 LAB — CBC
HCT: 25.8 % — ABNORMAL LOW (ref 36.0–46.0)
Hemoglobin: 8.7 g/dL — ABNORMAL LOW (ref 12.0–15.0)
MCH: 29.3 pg (ref 26.0–34.0)
MCHC: 33.7 g/dL (ref 30.0–36.0)
MCV: 86.9 fL (ref 78.0–100.0)
Platelets: 180 10*3/uL (ref 150–400)
RBC: 2.97 MIL/uL — ABNORMAL LOW (ref 3.87–5.11)
RDW: 14.5 % (ref 11.5–15.5)
WBC: 8.6 10*3/uL (ref 4.0–10.5)

## 2015-04-12 MED ORDER — GUAIFENESIN ER 600 MG PO TB12
600.0000 mg | ORAL_TABLET | Freq: Two times a day (BID) | ORAL | Status: DC | PRN
  Administered 2015-04-12 – 2015-04-13 (×2): 600 mg via ORAL
  Filled 2015-04-12 (×2): qty 1

## 2015-04-12 NOTE — Progress Notes (Signed)
3 Days Post-Op  Subjective: Bowels moving  Sore around the incision  Cough  Most of the night clear sputum    Objective: Vital signs in last 24 hours: Temp:  [98.3 F (36.8 C)-99.2 F (37.3 C)] 98.9 F (37.2 C) (06/25 0506) Pulse Rate:  [92-107] 103 (06/25 0506) Resp:  [18-20] 20 (06/25 0506) BP: (142-163)/(58-93) 163/93 mmHg (06/25 0506) SpO2:  [94 %-95 %] 95 % (06/25 0506) Last BM Date: 04/11/15  Intake/Output from previous day: 06/24 0701 - 06/25 0700 In: 964.6 [P.O.:270; I.V.:694.6] Out: 135 [Drains:135] Intake/Output this shift: Total I/O In: -  Out: 45 [Drains:45]  General appearance: alert and cooperative Resp: clear to auscultation bilaterally Cardio: regular rate and rhythm, S1, S2 normal, no murmur, click, rub or gallop Incision/Wound:CDI honeycomb in place LLQ wound open and clean   Lab Results:   Recent Labs  04/11/15 0802 04/12/15 0340  WBC 8.5 8.6  HGB 8.5* 8.7*  HCT 26.0* 25.8*  PLT 168 180   BMET  Recent Labs  04/10/15 0316 04/11/15 0645  NA 132* 136  K 3.6 3.7  CL 98* 105  CO2 26 27  GLUCOSE 99 115*  BUN 7 <5*  CREATININE 0.58 0.45  CALCIUM 7.9* 7.7*   PT/INR No results for input(s): LABPROT, INR in the last 72 hours. ABG No results for input(s): PHART, HCO3 in the last 72 hours.  Invalid input(s): PCO2, PO2  Studies/Results: No results found.  Anti-infectives: Anti-infectives    Start     Dose/Rate Route Frequency Ordered Stop   04/09/15 2200  cefoTEtan (CEFOTAN) 2 g in dextrose 5 % 50 mL IVPB     2 g 100 mL/hr over 30 Minutes Intravenous Every 12 hours 04/09/15 1635 04/09/15 2337   04/09/15 0800  cefOXitin (MEFOXIN) 2 g in dextrose 5 % 50 mL IVPB     2 g 100 mL/hr over 30 Minutes Intravenous To Surgery 04/08/15 1450 04/09/15 0855   04/09/15 0649  dextrose 5 % with cefOXitin (MEFOXIN) ADS Med  Status:  Discontinued    Comments:  Marrianne Mood   : cabinet override      04/09/15 0649 04/09/15 0703       Assessment/Plan: s/p Procedure(s): LAPAROSCOPIC ASSISTED COLOSTOMY CLOSURE; RIGID SIGMOIDOSCOPY (N/A) Advance diet Check CXR due to cough and sputum production OOB   LOS: 3 days    Garvis Downum A. 04/12/2015

## 2015-04-13 MED ORDER — HYDROCODONE-ACETAMINOPHEN 7.5-325 MG PO TABS: 1.0000 | ORAL_TABLET | Freq: Four times a day (QID) | ORAL | Status: DC | PRN

## 2015-04-13 NOTE — Progress Notes (Signed)
4 Days Post-Op  Subjective: 1 bout of emesis yesterday but has BM wants soft food   Walked halls yesterday  Feels stronger  Objective: Vital signs in last 24 hours: Temp:  [98 F (36.7 C)-98.4 F (36.9 C)] 98 F (36.7 C) (06/26 0516) Pulse Rate:  [79-85] 85 (06/26 0516) Resp:  [16-18] 16 (06/26 0516) BP: (134-136)/(58-65) 134/58 mmHg (06/26 0516) SpO2:  [98 %-100 %] 99 % (06/26 0516) Last BM Date: 04/12/15  Intake/Output from previous day: 06/25 0701 - 06/26 0700 In: 2300 [P.O.:720; I.V.:1500] Out: 130 [Drains:130] Intake/Output this shift:    Resp: clear to auscultation bilaterally Cardio: regular rate and rhythm, S1, S2 normal, no murmur, click, rub or gallop Incision/Wound:CDI honeycomb in place  Soft ND min tender JP serous     Lab Results:   Recent Labs  04/11/15 0802 04/12/15 0340  WBC 8.5 8.6  HGB 8.5* 8.7*  HCT 26.0* 25.8*  PLT 168 180   BMET  Recent Labs  04/11/15 0645  NA 136  K 3.7  CL 105  CO2 27  GLUCOSE 115*  BUN <5*  CREATININE 0.45  CALCIUM 7.7*   PT/INR No results for input(s): LABPROT, INR in the last 72 hours. ABG No results for input(s): PHART, HCO3 in the last 72 hours.  Invalid input(s): PCO2, PO2  Studies/Results: Dg Chest Port 1 View  04/12/2015   CLINICAL DATA:  Cough  EXAM: PORTABLE CHEST - 1 VIEW  COMPARISON:  12/11/2014  FINDINGS: Cardiac shadow is stable. The previously seen right-sided PICC line has been removed in the interval. Right basilar atelectasis is noted. No acute bony abnormality is seen.  IMPRESSION: Right basilar atelectasis.  No focal confluent infiltrate is noted.   Electronically Signed   By: Alcide Clever M.D.   On: 04/12/2015 08:28    Anti-infectives: Anti-infectives    Start     Dose/Rate Route Frequency Ordered Stop   04/09/15 2200  cefoTEtan (CEFOTAN) 2 g in dextrose 5 % 50 mL IVPB     2 g 100 mL/hr over 30 Minutes Intravenous Every 12 hours 04/09/15 1635 04/09/15 2337   04/09/15 0800  cefOXitin  (MEFOXIN) 2 g in dextrose 5 % 50 mL IVPB     2 g 100 mL/hr over 30 Minutes Intravenous To Surgery 04/08/15 1450 04/09/15 0855   04/09/15 0649  dextrose 5 % with cefOXitin (MEFOXIN) ADS Med  Status:  Discontinued    Comments:  Marrianne Mood   : cabinet override      04/09/15 0649 04/09/15 0703      Assessment/Plan: s/p Procedure(s): LAPAROSCOPIC ASSISTED COLOSTOMY CLOSURE; RIGID SIGMOIDOSCOPY (N/A) Lungs better  Advance diet Will need HHN FOR DRAIN CARE AND DRESSING CHANGES  AT DISCHARGE   LOS: 4 days    Misty Rago A. 04/13/2015

## 2015-04-14 ENCOUNTER — Inpatient Hospital Stay (HOSPITAL_COMMUNITY): Payer: Medicare Other

## 2015-04-14 LAB — BASIC METABOLIC PANEL
Anion gap: 5 (ref 5–15)
CALCIUM: 8.1 mg/dL — AB (ref 8.9–10.3)
CO2: 26 mmol/L (ref 22–32)
Chloride: 102 mmol/L (ref 101–111)
Creatinine, Ser: 0.32 mg/dL — ABNORMAL LOW (ref 0.44–1.00)
GFR calc Af Amer: >60 mL/min (ref >60)
Glucose, Bld: 119 mg/dL — ABNORMAL HIGH (ref 65–99)
Potassium: 3.9 mmol/L (ref 3.5–5.1)
Sodium: 133 mmol/L — ABNORMAL LOW (ref 135–145)

## 2015-04-14 LAB — CBC
HEMATOCRIT: 30.7 % — AB (ref 36.0–46.0)
Hemoglobin: 10.3 g/dL — ABNORMAL LOW (ref 12.0–15.0)
MCH: 28.9 pg (ref 26.0–34.0)
MCHC: 33.6 g/dL (ref 30.0–36.0)
MCV: 86.2 fL (ref 78.0–100.0)
Platelets: 276 10*3/uL (ref 150–400)
RBC: 3.56 MIL/uL — ABNORMAL LOW (ref 3.87–5.11)
RDW: 14.4 % (ref 11.5–15.5)
WBC: 8.2 10*3/uL (ref 4.0–10.5)

## 2015-04-14 MED ORDER — ONDANSETRON HCL 4 MG PO TABS: 4.0000 mg | ORAL_TABLET | Freq: Four times a day (QID) | ORAL | Status: DC | PRN

## 2015-04-14 MED ORDER — PROMETHAZINE HCL 25 MG/ML IJ SOLN: 6.2500 mg | Freq: Three times a day (TID) | INTRAMUSCULAR | Status: DC | PRN

## 2015-04-14 MED ORDER — POLYETHYLENE GLYCOL 3350 17 G PO PACK: 17.0000 g | PACK | Freq: Every day | ORAL | Status: DC

## 2015-04-14 MED ORDER — SACCHAROMYCES BOULARDII 250 MG PO CAPS: 250.0000 mg | ORAL_CAPSULE | Freq: Two times a day (BID) | ORAL | Status: DC

## 2015-04-14 MED ORDER — HYDROCODONE-ACETAMINOPHEN 7.5-325 MG PO TABS: 1.0000 | ORAL_TABLET | Freq: Four times a day (QID) | ORAL | Status: DC | PRN

## 2015-04-14 MED ORDER — POLYETHYLENE GLYCOL 3350 17 G PO PACK
17.0000 g | PACK | Freq: Every day | ORAL | Status: DC
  Administered 2015-04-16: 17 g via ORAL
  Filled 2015-04-14 (×2): qty 1

## 2015-04-14 MED ORDER — PROMETHAZINE HCL 25 MG/ML IJ SOLN
6.2500 mg | Freq: Three times a day (TID) | INTRAMUSCULAR | Status: DC | PRN
  Administered 2015-04-14: 12.5 mg via INTRAVENOUS
  Filled 2015-04-14: qty 1

## 2015-04-14 NOTE — Discharge Summary (Signed)
Physician Discharge Summary  Patient ID: April Hull MRN: [id number] DOB/AGE: [birth year] 73 y.o.  Admit date: 04/09/2015 Discharge date: 04/14/2015  Admission Diagnoses: Colostomy in place  Discharge Diagnoses:  Active Problems:   Colostomy in place   Discharged Condition: good  Hospital Course: pt did well had some productive cough but this improved with pulmonary toilet.  JP drain serous and bowel function returned  POD 2. Wounds clean and abdomen soft. Walking halls without difficulty  Consults: None  Significant Diagnostic Studies: labs:  CBC    Component Value Date/Time   WBC 8.6 04/12/2015 0340   RBC 2.97* 04/12/2015 0340   HGB 8.7* 04/12/2015 0340   HCT 25.8* 04/12/2015 0340   PLT 180 04/12/2015 0340   MCV 86.9 04/12/2015 0340   MCH 29.3 04/12/2015 0340   MCHC 33.7 04/12/2015 0340   RDW 14.5 04/12/2015 0340   LYMPHSABS 2.8 04/01/2015 1425   MONOABS 0.6 04/01/2015 1425   EOSABS 0.1 04/01/2015 1425   BASOSABS 0.0 04/01/2015 1425     Treatments: surgery: colostomy closure   Discharge Exam: Blood pressure 149/63, pulse 97, temperature 98.1 F (36.7 C), temperature source Oral, resp. rate 18, height 5\' 3"  (1.6 m), weight 48.988 kg (108 lb), SpO2 97 %. General appearance: alert and cooperative Resp: clear to auscultation bilaterally Cardio: regular rate and rhythm, S1, S2 normal, no murmur, click, rub or gallop Incision/Wound:CDI staples in place.  Ostomy site clean JP serous   Disposition: home   Discharge Instructions    Diet - low sodium heart healthy    Complete by:  As directed      Increase activity slowly    Complete by:  As directed             Medication List    TAKE these medications        albuterol 108 (90 BASE) MCG/ACT inhaler  Commonly known as:  PROVENTIL HFA;VENTOLIN HFA  Inhale 2 puffs into the lungs every 6 (six) hours as needed for wheezing or shortness of breath.     albuterol 108 (90 BASE) MCG/ACT inhaler  Commonly known  as:  PROAIR HFA  Inhale 2 puffs into the lungs every 6 (six) hours as needed for wheezing or shortness of breath.     aspirin 81 MG tablet  Take 81 mg by mouth every other day.     atorvastatin 10 MG tablet  Commonly known as:  LIPITOR  Take 10 mg by mouth every evening.     fluticasone 50 MCG/ACT nasal spray  Commonly known as:  FLONASE  USE TWO SPRAY(S) IN EACH NOSTRIL ONCE DAILY     HYDROcodone-acetaminophen 7.5-325 MG per tablet  Commonly known as:  NORCO  Take 1 tablet by mouth every 6 (six) hours as needed for moderate pain.     levothyroxine 100 MCG tablet  Commonly known as:  SYNTHROID, LEVOTHROID  Take 100 mcg by mouth daily before breakfast.     lisinopril 10 MG tablet  Commonly known as:  PRINIVIL,ZESTRIL  Take 10 mg by mouth daily.     ondansetron 4 MG tablet  Commonly known as:  ZOFRAN  Take 1 tablet (4 mg total) by mouth every 6 (six) hours as needed for nausea.     pantoprazole 40 MG tablet  Commonly known as:  PROTONIX  Take 40 mg by mouth daily.     polyethylene glycol packet  Commonly known as:  MIRALAX / GLYCOLAX  Take 17 g by mouth daily.  saccharomyces boulardii 250 MG capsule  Commonly known as:  FLORASTOR  Take 1 capsule (250 mg total) by mouth 2 (two) times daily.         Signed: Dorinda Stehr A. 04/14/2015, 6:42 AM

## 2015-04-14 NOTE — Progress Notes (Signed)
5 Days Post-Op  Subjective: Moving bowels no vomiting  Objective: Vital signs in last 24 hours: Temp:  [97.9 F (36.6 C)-99.5 F (37.5 C)] 98.1 F (36.7 C) (06/27 0630) Pulse Rate:  [77-97] 97 (06/27 0630) Resp:  [16-18] 18 (06/27 0630) BP: (141-149)/(54-63) 149/63 mmHg (06/27 0630) SpO2:  [96 %-98 %] 97 % (06/27 0630) Last BM Date: 04/13/15  Intake/Output from previous day: 06/26 0701 - 06/27 0700 In: 3320 [P.O.:1220; I.V.:2100] Out: 170 [Drains:170] Intake/Output this shift: Total I/O In: 2600 [P.O.:500; I.V.:2100] Out: 110 [Drains:110]  Incision/Wound:CDI JP serous soft ND   Lab Results:   Recent Labs  04/11/15 0802 04/12/15 0340  WBC 8.5 8.6  HGB 8.5* 8.7*  HCT 26.0* 25.8*  PLT 168 180   BMET  Recent Labs  04/11/15 0645  NA 136  K 3.7  CL 105  CO2 27  GLUCOSE 115*  BUN <5*  CREATININE 0.45  CALCIUM 7.7*   PT/INR No results for input(s): LABPROT, INR in the last 72 hours. ABG No results for input(s): PHART, HCO3 in the last 72 hours.  Invalid input(s): PCO2, PO2  Studies/Results: Dg Chest Port 1 View  04/12/2015   CLINICAL DATA:  Cough  EXAM: PORTABLE CHEST - 1 VIEW  COMPARISON:  12/11/2014  FINDINGS: Cardiac shadow is stable. The previously seen right-sided PICC line has been removed in the interval. Right basilar atelectasis is noted. No acute bony abnormality is seen.  IMPRESSION: Right basilar atelectasis.  No focal confluent infiltrate is noted.   Electronically Signed   By: Alcide CleverMark  Lukens M.D.   On: 04/12/2015 08:28    Anti-infectives: Anti-infectives    Start     Dose/Rate Route Frequency Ordered Stop   04/09/15 2200  cefoTEtan (CEFOTAN) 2 g in dextrose 5 % 50 mL IVPB     2 g 100 mL/hr over 30 Minutes Intravenous Every 12 hours 04/09/15 1635 04/09/15 2337   04/09/15 0800  cefOXitin (MEFOXIN) 2 g in dextrose 5 % 50 mL IVPB     2 g 100 mL/hr over 30 Minutes Intravenous To Surgery 04/08/15 1450 04/09/15 0855   04/09/15 0649  dextrose 5 %  with cefOXitin (MEFOXIN) ADS Med  Status:  Discontinued    Comments:  Marrianne MoodAltman, Deborah   : cabinet override      04/09/15 0649 04/09/15 0703      Assessment/Plan: s/p Procedure(s): LAPAROSCOPIC ASSISTED COLOSTOMY CLOSURE; RIGID SIGMOIDOSCOPY (N/A) Discharge  HHN ordered for drain care and dressing changes  RTC 1 week   LOS: 5 days    Lamija Besse A. 04/14/2015

## 2015-04-14 NOTE — Progress Notes (Signed)
Patient having constant N/V, not able to be discharged.  Started Zofran and Phenergan.  Check KUB, labs.

## 2015-04-14 NOTE — Care Management (Signed)
Important Message  Patient Details  Name: April Hull MRN: [id number] Date of Birth: 05-25-1941   Medicare Important Message Given:  Yes-second notification given    Orson Aloe 04/14/2015, 1:12 PM

## 2015-04-14 NOTE — Care Management Note (Signed)
Case Management Note  Patient Details  Name: April Hull MRN: [id number] Date of Birth: 07-05-1941  Subjective/Objective:                    Action/Plan:UR updated   Orders for home health . [facility] , spoke to [employer] [place] this am 1000 , he will have to check on staffing , called again at 1200 , he will call NCM back after 1400 . Awaiting call. Expected Discharge Date:       04-15-15           Expected Discharge Plan:  Home w Home Health Services  In-House Referral:     Discharge planning Services  CM Consult  Post Acute Care Choice:    Choice offered to:  Patient  DME Arranged:    DME Agency:     HH Arranged:    HH Agency:     Status of Service:     Medicare Important Message Given:  Yes-second notification given Date Medicare IM Given:    Medicare IM give by:    Date Additional Medicare IM Given:    Additional Medicare Important Message give by:     If discussed at Long Length of Stay Meetings, dates discussed:    Additional Comments:  Kingsley PlanWile, Louiza Moor Marie, RN 04/14/2015, 2:43 PM

## 2015-04-15 LAB — CREATININE, FLUID (PLEURAL, PERITONEAL, JP DRAINAGE): Creat, Fluid: 0.5 mg/dL

## 2015-04-15 MED ORDER — METOCLOPRAMIDE HCL 5 MG/ML IJ SOLN
10.0000 mg | Freq: Four times a day (QID) | INTRAMUSCULAR | Status: DC
  Administered 2015-04-15: 10 mg via INTRAVENOUS
  Filled 2015-04-15 (×2): qty 2

## 2015-04-15 MED ORDER — POLYETHYLENE GLYCOL 3350 17 G PO PACK
17.0000 g | PACK | Freq: Two times a day (BID) | ORAL | Status: DC
  Administered 2015-04-15 – 2015-04-16 (×3): 17 g via ORAL
  Filled 2015-04-15: qty 1

## 2015-04-15 NOTE — Progress Notes (Signed)
6 Days Post-Op  Subjective: Vomited prior to discharge so kept Feel better today no flatus or BM yesterday Films show some colonic distention  Labs ok   Objective: Vital signs in last 24 hours: Temp:  [97.6 F (36.4 C)-98.2 F (36.8 C)] 97.6 F (36.4 C) (06/28 0515) Pulse Rate:  [84-93] 84 (06/28 0515) Resp:  [16-18] 16 (06/28 0515) BP: (148-152)/(63-65) 148/63 mmHg (06/27 2211) SpO2:  [95 %-97 %] 95 % (06/28 0515) Last BM Date: 04/13/15  Intake/Output from previous day: 06/27 0701 - 06/28 0700 In: 700 [P.O.:700] Out: 340 [Drains:340] Intake/Output this shift:    General appearance: alert Resp: clear to auscultation bilaterally Incision/Wound:CDI staple  More distended today   Not tender  JP drain leaking around site and output up now clear looking but 200 cc serous appearing   No stool or succus   Lab Results:   Recent Labs  04/14/15 1024  WBC 8.2  HGB 10.3*  HCT 30.7*  PLT 276   BMET  Recent Labs  04/14/15 1024  NA 133*  K 3.9  CL 102  CO2 26  GLUCOSE 119*  BUN <5*  CREATININE 0.32*  CALCIUM 8.1*   PT/INR No results for input(s): LABPROT, INR in the last 72 hours. ABG No results for input(s): PHART, HCO3 in the last 72 hours.  Invalid input(s): PCO2, PO2  Studies/Results: Dg Abd Portable 1v  04/14/2015   CLINICAL DATA:  Vomiting, ileus.  EXAM: PORTABLE ABDOMEN - 1 VIEW  COMPARISON:  12/01/2014  FINDINGS: Right lower quadrant drain noted. Lower pelvic surgical midline staples noted. Nonobstructive bowel gas pattern with mild gaseous distention of the colon. This may reflect mild ileus. No free air organomegaly.  IMPRESSION: Mild gaseous distention of colon, likely mild ileus.   Electronically Signed   By: Charlett NoseKevin  Dover M.D.   On: 04/14/2015 10:46    Anti-infectives: Anti-infectives    Start     Dose/Rate Route Frequency Ordered Stop   04/09/15 2200  cefoTEtan (CEFOTAN) 2 g in dextrose 5 % 50 mL IVPB     2 g 100 mL/hr over 30 Minutes Intravenous  Every 12 hours 04/09/15 1635 04/09/15 2337   04/09/15 0800  cefOXitin (MEFOXIN) 2 g in dextrose 5 % 50 mL IVPB     2 g 100 mL/hr over 30 Minutes Intravenous To Surgery 04/08/15 1450 04/09/15 0855   04/09/15 0649  dextrose 5 % with cefOXitin (MEFOXIN) ADS Med  Status:  Discontinued    Comments:  Marrianne MoodAltman, Deborah   : cabinet override      04/09/15 0649 04/09/15 0703      Assessment/Plan: s/p Procedure(s): LAPAROSCOPIC ASSISTED COLOSTOMY CLOSURE; RIGID SIGMOIDOSCOPY (N/A) More distended today but feel much better and has had no nausea or vomiting JP output up but clear in nature labs ok will check for creatinine in fluid since abrupt increase in output but clear.  Difficult pelvic dissection so will exclude delayed ureteral injury.  Wants to go home but will hold off due to distention and no BM yesterday SL IV   LOS: 6 days    April Hull A. 04/15/2015

## 2015-04-15 NOTE — Care Management (Signed)
April Hull with April Hull , confirmed , April Hull can provide home health RN at discharge. April FlurryHeather Marrianne Sica RN BSN [phone]

## 2015-04-16 MED ORDER — POLYETHYLENE GLYCOL 3350 17 G PO PACK: 17.0000 g | PACK | Freq: Two times a day (BID) | ORAL | Status: DC

## 2015-04-16 NOTE — Progress Notes (Signed)
  Pharmacy Discharge Medication Therapy Review   Total Number of meds on admission _____6_____ (polypharmacy > 10 meds)  Indications for all medications: [x]  Yes       []  No  Adherence Review  []  Excellent (no doses missed/week)     []  Good (no more than 1 dose missed/week)     []  Partial (2-3 doses missed/week)     []  Poor (>3 doses missed/week)     [x]  Not Assessed  Total number of high risk medications __1__ (Anticoagulants, Dual antiplatelets, oral Antihyperglycemic agents,Insulins, Antipsychotics, Anti-Seizure meds, Inhalers, HF/ACS meds, Antibiotics and HIV medications)   Assessment: (Medication related problems)  Intervention  YES NO  Explanation   Indications      Medication without noted indication []  [x]     Indication without noted medication []  [x]     Duplicate therapy [x]  []  Upon reviewing discharge medication list, the following duplicates were found:  - polyethylene glycol packet  Take 17 g by mouth daily - polyethylene glycol packet Take 17 g by mouth 2 (two) times daily. - albuterol 108 (90 BASE) MCG/ACT inhaler Inhale 2 puffs into the lungs every 6 (six) hours as needed for wheezing or shortness of breath. - albuterol 108 (90 BASE) MCG/ACT inhaler Inhale 2 puffs into the lungs every 6 (six) hours as needed for wheezing or shortness of breath.  Spoke w/ Dr. Luisa Hartornett regarding Miralax order duplication. He informed pharmacy to D/C twice daily order of Miralax. Duplicated albuterol order discontinued.   Efficacy      Suboptimal drug or dose selection []  [x]    Insufficient dose/duration []  [x]    Failure to receive therapy  (Rx not filled) []  [x]     Safety      Adverse drug event []  [x]     Drug interaction []  [x]     Excessive dose/duration []  [x]    High-risk medications []  [x]    Compliance     Underuse []  [x]     Overuse []  [x]    Other pertinent pharmacist counseling []  [x]  Of note, ondansetron is not covered by her insurance.  Please consider alternative if  patient experiences severe N/V.      Total number of new medications upon discharge: ____4___  Time:  Time spent preparing for discharge counseling: 0 min  Time spent counseling patient: 0 min Additional time spent on discharge (specify): 10 minutes reviewing discharge medication list, contacting physician, and typing progress note   PLAN:  Consider the following at discharge/Recommendations discussed with provider - DC:  - polyethylene glycol packet Take 17 g by mouth 2 (two) times daily.  Called pharmacy to ensure correct prescription received.    - albuterol 108 (90 BASE) MCG/ACT inhaler Inhale 2 puffs into the lungs every 6 (six) hours as needed for wheezing or shortness of breath. (duplicated order)  Delmar LandauJonathan Worley, PharmD candidate  I approve of this recommendations discussed in this note.  Red ChristiansSamson Omaira Mellen, Pharm. D. Clinical Pharmacy Resident Pager: [phone] Ph: [phone] 04/16/2015 10:59 AM

## 2015-04-16 NOTE — Discharge Summary (Signed)
Addendum:  Pt held for 2 additional days due to nausea and vomiting which resolved with a BM.  Has an excessive amount of JP drainage but is 11 L plus on I/O.  Good uop.  PO intake adequate.  Labs WNL and fluid tested for Cr level and this consistent with serum.  [facility]

## 2015-04-16 NOTE — Progress Notes (Signed)
7 Days Post-Op  Subjective: Pt moving bowels  High JP output but serous and tested for Cr and this is 0.5 consistent with serum Feels well   Objective: Vital signs in last 24 hours: Temp:  [97.7 F (36.5 C)-98 F (36.7 C)] 97.7 F (36.5 C) (06/29 0545) Pulse Rate:  [80-89] 80 (06/29 0545) Resp:  [16-17] 17 (06/29 0545) BP: (145-154)/(60-67) 149/63 mmHg (06/29 0545) SpO2:  [96 %-99 %] 96 % (06/29 0545) Last BM Date: 04/16/15  Intake/Output from previous day: 06/28 0701 - 06/29 0700 In: 820 [P.O.:820] Out: 695 [Drains:695] Intake/Output this shift:    Incision/Wound:soft abdomen  Ostomy site clean     JP serous no stool or succus staples intact clean no erythema   Lab Results:   Recent Labs  04/14/15 1024  WBC 8.2  HGB 10.3*  HCT 30.7*  PLT 276   BMET  Recent Labs  04/14/15 1024  NA 133*  K 3.9  CL 102  CO2 26  GLUCOSE 119*  BUN <5*  CREATININE 0.32*  CALCIUM 8.1*   PT/INR No results for input(s): LABPROT, INR in the last 72 hours. ABG No results for input(s): PHART, HCO3 in the last 72 hours.  Invalid input(s): PCO2, PO2  Studies/Results: Dg Abd Portable 1v  04/14/2015   CLINICAL DATA:  Vomiting, ileus.  EXAM: PORTABLE ABDOMEN - 1 VIEW  COMPARISON:  12/01/2014  FINDINGS: Right lower quadrant drain noted. Lower pelvic surgical midline staples noted. Nonobstructive bowel gas pattern with mild gaseous distention of the colon. This may reflect mild ileus. No free air organomegaly.  IMPRESSION: Mild gaseous distention of colon, likely mild ileus.   Electronically Signed   By: Charlett NoseKevin  Dover M.D.   On: 04/14/2015 10:46    Anti-infectives: Anti-infectives    Start     Dose/Rate Route Frequency Ordered Stop   04/09/15 2200  cefoTEtan (CEFOTAN) 2 g in dextrose 5 % 50 mL IVPB     2 g 100 mL/hr over 30 Minutes Intravenous Every 12 hours 04/09/15 1635 04/09/15 2337   04/09/15 0800  cefOXitin (MEFOXIN) 2 g in dextrose 5 % 50 mL IVPB     2 g 100 mL/hr over 30  Minutes Intravenous To Surgery 04/08/15 1450 04/09/15 0855   04/09/15 0649  dextrose 5 % with cefOXitin (MEFOXIN) ADS Med  Status:  Discontinued    Comments:  Marrianne MoodAltman, Deborah   : cabinet override      04/09/15 0649 04/09/15 0703      Assessment/Plan: s/p Procedure(s): LAPAROSCOPIC ASSISTED COLOSTOMY CLOSURE; RIGID SIGMOIDOSCOPY (N/A) Discharge  LOS: 7 days    April Hull A. 04/16/2015

## 2015-04-16 NOTE — Discharge Instructions (Signed)
[facility]      [facility] °[phone] ° °OPEN ABDOMINAL SURGERY: POST OP INSTRUCTIONS ° °Always review your discharge instruction sheet given to you by the facility where your surgery was performed. ° °IF YOU HAVE DISABILITY OR FAMILY LEAVE FORMS, YOU MUST BRING THEM TO THE OFFICE FOR PROCESSING.  PLEASE DO NOT GIVE THEM TO YOUR DOCTOR. ° °1. A prescription for pain medication may be given to you upon discharge.  Take your pain medication as prescribed, if needed.  If narcotic pain medicine is not needed, then you may take acetaminophen (Tylenol) or ibuprofen (Advil) as needed. °2. Take your usually prescribed medications unless otherwise directed. °3. If you need a refill on your pain medication, please contact your pharmacy. They will contact our office to request authorization.  Prescriptions will not be filled after 5pm or on week-ends. °4. You should follow a light diet the first few days after arrival home, such as soup and crackers, pudding, etc.unless your doctor has advised otherwise. A high-fiber, low fat diet can be resumed as tolerated.   Be sure to include lots of fluids daily. Most patients will experience some swelling and bruising on the chest and neck area.  Ice packs will help.  Swelling and bruising can take several days to resolve °5. Most patients will experience some swelling and bruising in the area of the incision. Ice pack will help. Swelling and bruising can take several days to resolve..  °6. It is common to experience some constipation if taking pain medication after surgery.  Increasing fluid intake and taking a stool softener will usually help or prevent this problem from occurring.  A mild laxative (Milk of Magnesia or Miralax) should be taken according to package directions if there are no bowel movements after 48 hours. °7.  You may have steri-strips (small skin tapes) in place directly over the incision.  These strips should be left on the skin for 7-10 days.  If your  surgeon used skin glue on the incision, you may shower in 24 hours.  The glue will flake off over the next 2-3 weeks.  Any sutures or staples will be removed at the office during your follow-up visit. You may find that a light gauze bandage over your incision may keep your staples from being rubbed or pulled. You may shower and replace the bandage daily. °8. ACTIVITIES:  You may resume regular (light) daily activities beginning the next day--such as daily self-care, walking, climbing stairs--gradually increasing activities as tolerated.  You may have sexual intercourse when it is comfortable.  Refrain from any heavy lifting or straining until approved by your doctor. °a. You may drive when you no longer are taking prescription pain medication, you can comfortably wear a seatbelt, and you can safely maneuver your car and apply brakes °b. Return to Work: ___________________________________ °9. You should see your doctor in the office for a follow-up appointment approximately two weeks after your surgery.  Make sure that you call for this appointment within a day or two after you arrive home to insure a convenient appointment time. °OTHER INSTRUCTIONS:  °_____________________________________________________________ °_____________________________________________________________ ° °WHEN TO CALL YOUR DOCTOR: °1. Fever over 101.0 °2. Inability to urinate °3. Nausea and/or vomiting °4. Extreme swelling or bruising °5. Continued bleeding from incision. °6. Increased pain, redness, or drainage from the incision. °7. Difficulty swallowing or breathing °8. Muscle cramping or spasms. °9. Numbness or tingling in hands or feet or around lips. ° °The clinic staff is available to   answer your questions during regular business hours.  Please dont hesitate to call and ask to speak to one of the nurses if you have concerns.  For further questions, please visit [email or web address] Bulb Drain Home Care A bulb drain  consists of a thin rubber tube and a soft, round bulb that creates a gentle suction. The rubber tube is placed in the area where you had surgery. A bulb is attached to the end of the tube that is outside the body. The bulb drain removes excess fluid that normally builds up in a surgical wound after surgery. The color and amount of fluid will vary. Immediately after surgery, the fluid is bright red and is a little thicker than water. It may gradually change to a yellow or pink color and become more thin and water-like. When the amount decreases to about 1 or 2 tbsp in 24 hours, your health care provider will usually remove it. DAILY CARE  Keep the bulb flat (compressed) at all times, except while emptying it. The flatness creates suction. You can flatten the bulb by squeezing it firmly in the middle and then closing the cap.  Keep sites where the tube enters the skin dry and covered with a bandage (dressing).  Secure the tube 1-2 in (2.5-5.1 cm) below the insertion sites to keep it from pulling on your stitches. The tube is stitched in place and will not slip out.  Secure the bulb as directed by your health care provider.  For the first 3 days after surgery, there usually is more fluid in the bulb. Empty the bulb whenever it becomes half full because the bulb does not create enough suction if it is too full. The bulb could also overflow. Write down how much fluid you remove each time you empty your drain. Add up the amount removed in 24 hours.  Empty the bulb at the same time every day once the amount of fluid decreases and you only need to empty it once a day. Write down the amounts and the 24-hour totals to give to your health care provider. This helps your health care provider know when the tubes can be removed. EMPTYING THE BULB DRAIN Before emptying the bulb, get a measuring cup, a piece of paper and a pen, and wash your hands.  Gently run your fingers down the tube (stripping) to empty any  drainage from the tubing into the bulb. This may need to be done several times a day to clear the tubing of clots and tissue.  Open the bulb cap to release suction, which causes it to inflate. Do not touch the inside of the cap.  Gently run your fingers down the tube (stripping) to empty any drainage from the tubing into the bulb.  Hold the cap out of the way, and pour fluid into the measuring cup.   Squeeze the bulb to provide suction.  Replace the cap.   Check the tape that holds the tube to your skin. If it is becoming loose, you can remove the loose piece of tape and apply a new one. Then, pin the bulb to your shirt.   Write down the amount of fluid you emptied out. Write down the date and each time you emptied your bulb drain. (If there are 2 bulbs, note the amount of drainage from each bulb and keep the totals separate. Your health care provider will want to know the total amounts for each drain and which tube is draining more.)   Flush  the fluid down the toilet and wash your hands.   Call your health care provider once you have less than 2 tbsp of fluid collecting in the bulb drain every 24 hours. If there is drainage around the tube site, change dressings and keep the area dry. Cleanse around tube with sterile saline and place dry gauze around site. This gauze should be changed when it is soiled. If it stays clean and unsoiled, it should still be changed daily.  SEEK MEDICAL CARE IF:  Your drainage has a bad smell or is cloudy.   You have a fever.   Your drainage is increasing instead of decreasing.   Your tube fell out.   You have redness or swelling around the tube site.   You have drainage from a surgical wound.   Your bulb drain will not stay flat after you empty it.  MAKE SURE YOU:   Understand these instructions.  Will watch your condition.  Will get help right away if you are not doing well or get worse. Document Released: 10/01/2000 Document  Revised: 02/18/2014 Document Reviewed: 03/08/2012 [facility] Patient Information 2015 [place]. This information is not intended to replace advice given to you by your health care provider. Make sure you discuss any questions you have with your health care provider.    Dressing Change A dressing is a material placed over wounds. It keeps the wound clean, dry, and protected from further injury. This provides an environment that favors wound healing.  BEFORE YOU BEGIN  Get your supplies together. Things you may need include:  Saline solution.  Flexible gauze dressing.  Medicated cream.  Tape.  Gloves.  Abdominal dressing pads.  Gauze squares.  Plastic bags.  Take pain medicine 30 minutes before the dressing change if you need it.  Take a shower before you do the first dressing change of the day. Use plastic wrap or a plastic bag to prevent the dressing from getting wet. REMOVING YOUR OLD DRESSING   Wash your hands with soap and water. Dry your hands with a clean towel.  Put on your gloves.  Remove any tape.  Carefully remove the old dressing. If the dressing sticks, you may dampen it with warm water to loosen it, or follow your caregiver's specific directions.  Remove any gauze or packing tape that is in your wound.  Take off your gloves.  Put the gloves, tape, gauze, or any packing tape into a plastic bag. CHANGING YOUR DRESSING  Open the supplies.  Take the cap off the saline solution.  Open the gauze package so that the gauze remains on the inside of the package.  Put on your gloves.  Clean your wound as told by your caregiver.  If you have been told to keep your wound dry, follow those instructions.  Your caregiver may tell you to do one or more of the following:  Pick up the gauze. Pour the saline solution over the gauze. Squeeze out the extra saline solution.  Put medicated cream or other medicine on your wound if you have been told to do  so.  Put the solution soaked gauze only in your wound, not on the skin around it.  Pack your wound loosely or as told by your caregiver.  Put dry gauze on your wound.  Put abdominal dressing pads over the dry gauze if your wet gauze soaks through.  Tape the abdominal dressing pads in place so they will not fall off. Do not wrap the tape  completely around the affected part (arm, leg, abdomen).  Wrap the dressing pads with a flexible gauze dressing to secure it in place.  Take off your gloves. Put them in the plastic bag with the old dressing. Tie the bag shut and throw it away.  Keep the dressing clean and dry until your next dressing change.  Wash your hands. SEEK MEDICAL CARE IF:  Your skin around the wound looks red.  Your wound feels more tender or sore.  You see pus in the wound.  Your wound smells bad.  You have a fever.  Your skin around the wound has a rash that itches and burns.  You see black or yellow skin in your wound that was not there before.  You feel nauseous, throw up, and feel very tired. Document Released: 11/11/2004 Document Revised: 12/27/2011 Document Reviewed: 08/16/2011 [facility] Patient Information 2015 [place], [place]. This information is not intended to replace advice given to you by your health care provider. Make sure you discuss any questions you have with your health care provider.     Ok to shower with drain and remove dressing prior to shower and redress

## 2015-04-16 NOTE — Progress Notes (Signed)
Pt discharged to home.  Discharge instructions explained.  Pt has no questions at the time of discharge.  Pt states she has all belongings.  Pt taken downstairs via wheelchair.

## 2015-05-22 ENCOUNTER — Ambulatory Visit: Payer: Medicare Other | Admitting: Cardiology

## 2015-05-23 ENCOUNTER — Ambulatory Visit: Payer: Medicare Other | Admitting: Cardiology

## 2015-06-18 ENCOUNTER — Encounter: Payer: Self-pay | Admitting: Cardiology

## 2015-06-18 ENCOUNTER — Ambulatory Visit (INDEPENDENT_AMBULATORY_CARE_PROVIDER_SITE_OTHER): Payer: Medicare Other | Admitting: Cardiology

## 2015-06-18 VITALS — BP 122/78 | HR 75 | Ht 63.0 in | Wt 104.0 lb

## 2015-06-18 DIAGNOSIS — E785 Hyperlipidemia, unspecified: Secondary | ICD-10-CM

## 2015-06-18 DIAGNOSIS — I10 Essential (primary) hypertension: Secondary | ICD-10-CM | POA: Diagnosis not present

## 2015-06-18 DIAGNOSIS — I350 Nonrheumatic aortic (valve) stenosis: Secondary | ICD-10-CM | POA: Diagnosis not present

## 2015-06-18 NOTE — Progress Notes (Signed)
Cardiology Office Note  Date: 06/18/2015   ID: April Hull, DOB 10/28/1940, MRN [id number]  PCP: [name]., MD  Primary Cardiologist: [name], MD   Chief Complaint  Patient presents with  . Aortic Stenosis    History of Present Illness: April Hull is a 74 y.o. female last seen in January. She comes in for a routine visit today. Since I last saw her she has had trouble with bowel obstruction, underwent a sigmoid colectomy with colostomy, and then more recently reversal of her colostomy. She is having trouble with chronic diarrhea and weight loss, continues to follow with Dr. Luisa Hart. Fortunately, she has been stable from a cardiac perspective throughout this period.  Her most recent echocardiographic from January of this year's outlined below, overall moderate aortic stenosis which has been stable. She is due for a follow-up study.  Lab work from June reviewed below.  Past Medical History  Diagnosis Date  . Essential hypertension, benign   . GERD (gastroesophageal reflux disease)   . Aortic stenosis   . Mixed hyperlipidemia   . Colon polyp   . Sigmoid diverticulitis   . Osteoporosis   . COPD (chronic obstructive pulmonary disease)   . OA (osteoarthritis of the spine)   . History of pneumonia   . Hypothyroidism     Past Surgical History  Procedure Laterality Date  . Left shoulder surgery  2006  . Esophagogastroduodenoscopy  10/15/05  . Vaginal hysterectomy    . Colonoscopy  10/15/05  . Lumbar disc surgery      x 2  . Laparoscopic sigmoid colectomy N/A 11/27/2014    Procedure: LAPAROSCOPIC ASSISTED SIGMOID COLECTOMY;  Surgeon: Harriette Bouillon, MD;  Location: [facility] OR;  Service: General;  Laterality: N/A;  . Colostomy N/A 11/27/2014    Procedure: COLOSTOMY;  Surgeon: Harriette Bouillon, MD;  Location: [facility] OR;  Service: General;  Laterality: N/A;  . Back surgery    . Appendectomy    . Hammer toes Bilateral   . Colostomy takedown N/A 04/09/2015    Procedure:  LAPAROSCOPIC ASSISTED COLOSTOMY CLOSURE; RIGID SIGMOIDOSCOPY;  Surgeon: Harriette Bouillon, MD;  Location: [facility] OR;  Service: General;  Laterality: N/A;    Current Outpatient Prescriptions  Medication Sig Dispense Refill  . aspirin 81 MG tablet Take 81 mg by mouth every other day.     Marland Kitchen atorvastatin (LIPITOR) 10 MG tablet Take 10 mg by mouth every evening.    Marland Kitchen levothyroxine (SYNTHROID, LEVOTHROID) 100 MCG tablet Take 100 mcg by mouth daily before breakfast.    . lisinopril (PRINIVIL,ZESTRIL) 10 MG tablet Take 10 mg by mouth daily.    . pantoprazole (PROTONIX) 40 MG tablet Take 40 mg by mouth daily.     No current facility-administered medications for this visit.    Allergies:  Bactrim and Codeine   Social History: The patient  reports that she quit smoking about 6 months ago. Her smoking use included Cigarettes. She started smoking about 58 years ago. She has a 10 pack-year smoking history. She has never used smokeless tobacco. She reports that she does not drink alcohol or use illicit drugs.   ROS:  Please see the history of present illness. Otherwise, complete review of systems is positive for chronic diarrhea, fatigue, occasional dizziness.  All other systems are reviewed and negative.   Physical Exam: VS:  BP 122/78 mmHg  Pulse 75  Ht 5\' 3"  (1.6 m)  Wt 104 lb (47.174 kg)  BMI 18.43 kg/m2  SpO2 96%,  BMI Body mass index is 18.43 kg/(m^2).  Wt Readings from Last 3 Encounters:  06/18/15 104 lb (47.174 kg)  04/09/15 108 lb (48.988 kg)  04/01/15 113 lb (51.256 kg)     Thin woman in no distress. HEENT: Conjunctiva and lids normal, oropharynx clear with moist mucosa.  Neck: Supple, no elevated JVP or carotid bruits, no thyromegaly.  Lungs: Diminished breath sounds, nonlabored breathing at rest.  Cardiac: Regular rate and rhythm, no S3, 3/6 systolic murmur with preserved second heart, no pericardial rub.  Abdomen: Soft, nontender, bowel sounds present.  Extremities: No pitting  edema, distal pulses 2+.  Skin: Warm and dry.  Musculoskeletal: No kyphosis.  Neuropsychiatric: Alert and oriented x3, affect grossly appropriate.   ECG: Tracing from 11/23/2014 showed sinus rhythm with decreased R wave progression.   Recent Labwork: 12/07/2014: Magnesium 1.8 04/01/2015: ALT 12*; AST 20 04/14/2015: BUN <5*; Creatinine, Ser 0.32*; Hemoglobin 10.3*; Platelets 276; Potassium 3.9; Sodium 133*     Component Value Date/Time   TRIG 129 12/02/2014 0215    Other Studies Reviewed Today:  Echocardiogram 10/23/2014: Study Conclusions  - Left ventricle: The cavity size was normal. Wall thickness was increased in a pattern of moderate LVH. The estimated ejection fraction was 65%. Wall motion was normal; there were no regional wall motion abnormalities. Doppler parameters are consistent with abnormal left ventricular relaxation (grade 1 diastolic dysfunction). - Aortic valve: The gradients are only mildly elevated. There is significant calcification of the valve. I would call this moderate AS. I am not sure of the anatomy of the valve. Mild AI. Mean gradient (S): 19 mm Hg. Peak gradient (S): 36 mm Hg. Valve area (Vmax): 1.3 cm^2. - Mitral valve: Mild MAC. Mild mitral valve thickening. - Left atrium: The atrium was mildly dilated. - Right ventricle: Tapse suggests some RV dysfunction that is mild. The cavity size was normal.   Assessment and Plan:  1. Bicuspid valve with moderate aortic stenosis, symptomatically stable. She is due for a follow-up echocardiogram which will be arranged at her convenience.  2. Essential hypertension, blood pressure is normal today.  3. Hyperlipidemia, on Lipitor, followed by Dr. Sherril Croon.  Current medicines were reviewed with the patient today.   Orders Placed This Encounter  Procedures  . ECHOCARDIOGRAM COMPLETE    Disposition: FU with me in 6 months.   Signed, Jonelle Sidle, MD, [facility] 06/18/2015 11:13 AM      [facility] at [facility] [address] [place] [address], [place], [place] [address] Phone: [phone]; Fax: [phone]

## 2015-06-18 NOTE — Patient Instructions (Signed)

## 2015-06-26 ENCOUNTER — Telehealth: Payer: Self-pay | Admitting: *Deleted

## 2015-06-26 ENCOUNTER — Ambulatory Visit (INDEPENDENT_AMBULATORY_CARE_PROVIDER_SITE_OTHER): Payer: Medicare Other

## 2015-06-26 ENCOUNTER — Other Ambulatory Visit: Payer: Self-pay

## 2015-06-26 DIAGNOSIS — I350 Nonrheumatic aortic (valve) stenosis: Secondary | ICD-10-CM | POA: Diagnosis not present

## 2015-06-26 NOTE — Telephone Encounter (Signed)
-----   Message from Jonelle Sidle, MD sent at 06/26/2015  2:08 PM EDT ----- Reviewed report. Overall stable, moderate aortic stenosis. We will continue observation.

## 2015-06-26 NOTE — Telephone Encounter (Signed)
Pt aware, routed to pcp 

## 2015-12-02 IMAGING — CR DG ABD PORTABLE 1V
1 series · 1 of 1 positions shown · non-contrast
Comparison: 11/22/2014 CT of the abdomen and pelvis

CLINICAL DATA: Shortness of breath.  Vomiting.

EXAM:
PORTABLE ABDOMEN - 1 VIEW

[AP]
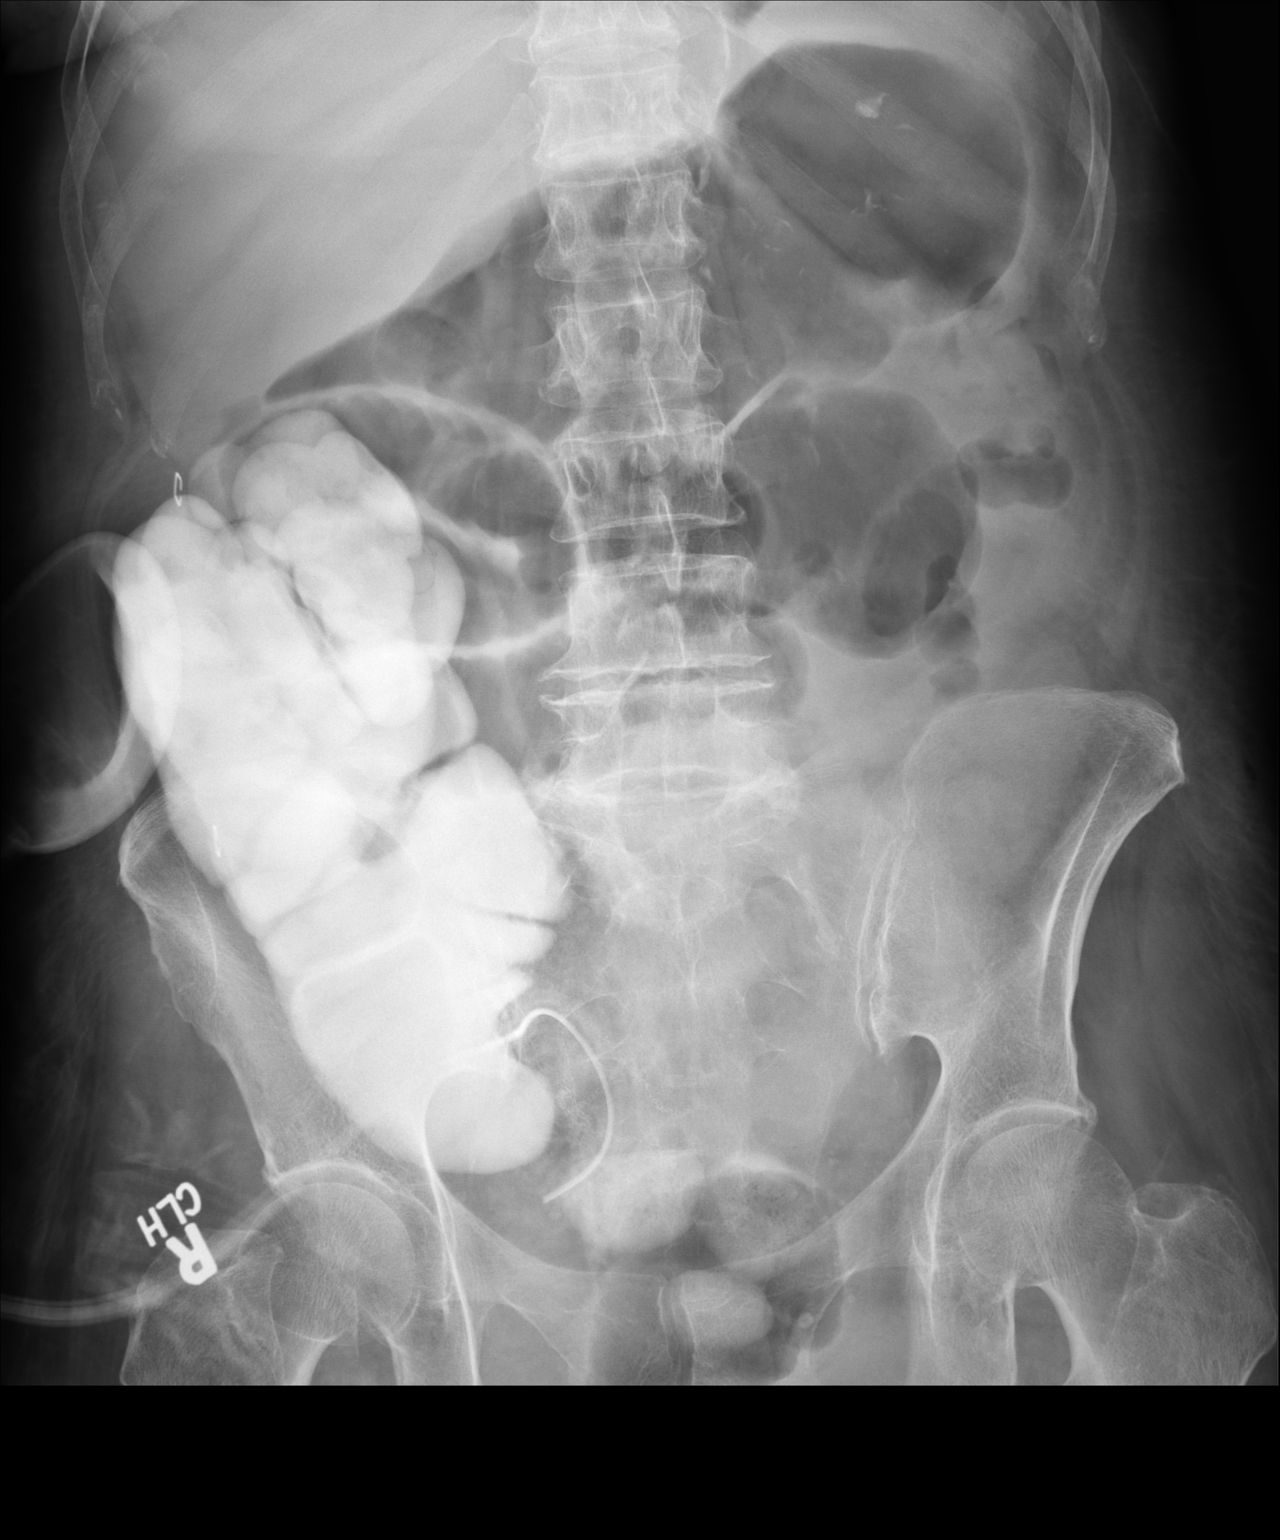

[1 of 1 positions shown; findings below may reference images not displayed]

FINDINGS: There is residual contrast in right-sided bowel loops since the
previous exam. There is dilatation of small bowel loops in the
central abdomen. Stomach is distended. No evidence for free air.
IMPRESSION: Dilated small bowel loops and stomach. Question of developing small
bowel obstruction.

## 2015-12-02 IMAGING — CR DG CHEST 1V PORT
1 series · 1 of 1 positions shown · non-contrast
Comparison: Chest radiograph 05/13/2014

CLINICAL DATA: Shortness of breath

EXAM:
PORTABLE CHEST - 1 VIEW

[AP]
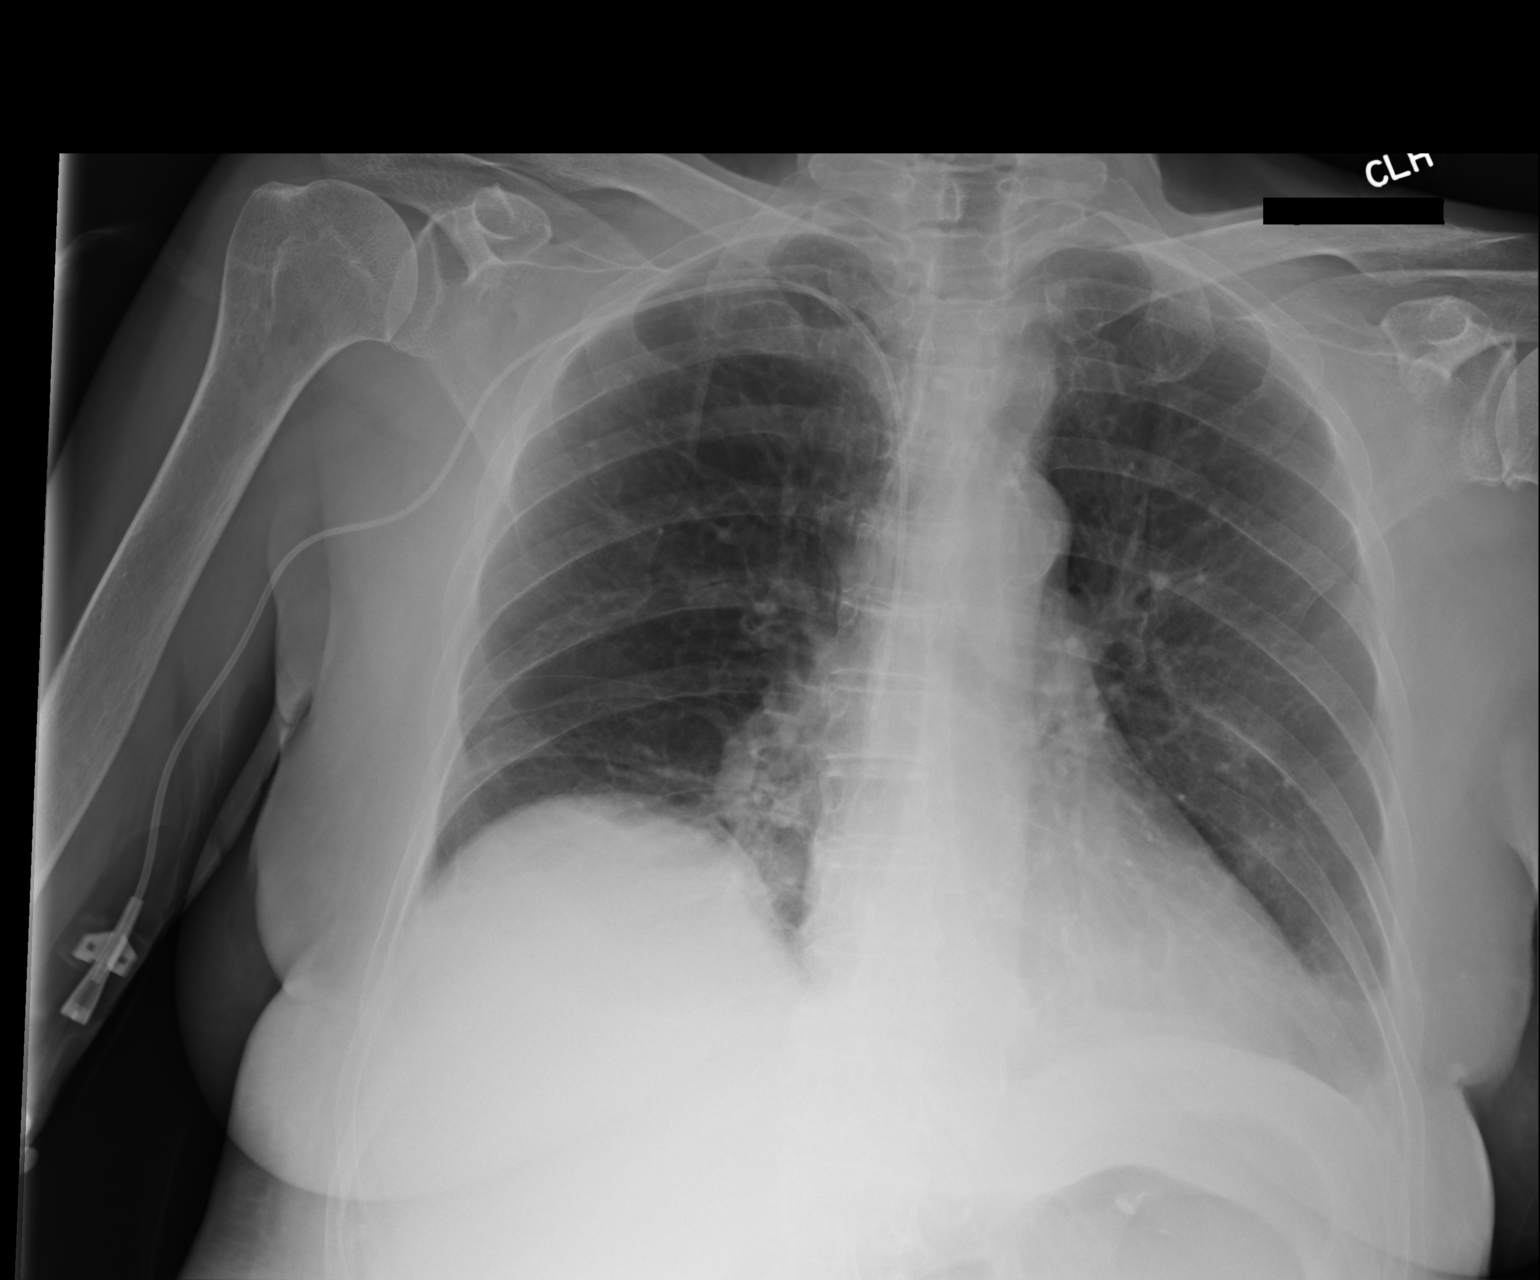

[1 of 1 positions shown; findings below may reference images not displayed]

FINDINGS: Right upper extremity PICC line is present with tip terminating at
the superior cavoatrial junction. Stable cardiac and mediastinal
contours. Elevation of the right hemidiaphragm. Minimal right
basilar heterogeneous opacities. No pleural effusion or
pneumothorax.
IMPRESSION: Elevation right hemidiaphragm.  Minimal right basilar atelectasis.

Right upper extremity PICC line tip projects over the superior
cavoatrial junction.

## 2016-01-30 ENCOUNTER — Ambulatory Visit (INDEPENDENT_AMBULATORY_CARE_PROVIDER_SITE_OTHER): Payer: Medicare Other | Admitting: Cardiology

## 2016-01-30 ENCOUNTER — Encounter: Payer: Self-pay | Admitting: Cardiology

## 2016-01-30 VITALS — BP 100/70 | HR 80 | Ht 63.0 in | Wt 115.0 lb

## 2016-01-30 DIAGNOSIS — F17201 Nicotine dependence, unspecified, in remission: Secondary | ICD-10-CM

## 2016-01-30 DIAGNOSIS — I350 Nonrheumatic aortic (valve) stenosis: Secondary | ICD-10-CM

## 2016-01-30 DIAGNOSIS — E785 Hyperlipidemia, unspecified: Secondary | ICD-10-CM | POA: Diagnosis not present

## 2016-01-30 DIAGNOSIS — I10 Essential (primary) hypertension: Secondary | ICD-10-CM | POA: Diagnosis not present

## 2016-01-30 NOTE — Progress Notes (Signed)
Cardiology Office Note  Date: 01/30/2016   ID: April Hull Seiter, DOB 02-02-1941, MRN [id number]  PCP: Ignatius SpeckingVYAS,DHRUV B., MD  Primary Cardiologist: Nona DellSamuel Candy Leverett, MD   Chief Complaint  Patient presents with  . Aortic Stenosis    History of Present Illness: April Hull April Hull is a 75 y.o. female last seen in August 2016. She presents for a routine follow-up visit. No change in stamina, no exertional chest pain or worsening dyspnea since last evaluation. She states that she has had some intermittent episodes of dizziness, has taken meclizine with improvement suggesting vertigo/inner ear process. No palpitations or syncope.  I reviewed her ECG today which shows sinus rhythm with decreased R wave progression.  We went over her medications. She continued on aspirin and Lipitor as well as lisinopril. Blood pressure is normal today.  Last echocardiogram was in September 2016, aortic stenosis was stable and moderate range with mild aortic regurgitation. Mean gradient was 24 mmHg.  Past Medical History  Diagnosis Date  . Essential hypertension, benign   . GERD (gastroesophageal reflux disease)   . Aortic stenosis   . Mixed hyperlipidemia   . Colon polyp   . Sigmoid diverticulitis   . Osteoporosis   . COPD (chronic obstructive pulmonary disease) (HCC)   . OA (osteoarthritis of the spine)   . History of pneumonia   . Hypothyroidism     Past Surgical History  Procedure Laterality Date  . Left shoulder surgery  2006  . Esophagogastroduodenoscopy  10/15/05  . Vaginal hysterectomy    . Colonoscopy  10/15/05  . Lumbar disc surgery      x 2  . Laparoscopic sigmoid colectomy N/A 11/27/2014    Procedure: LAPAROSCOPIC ASSISTED SIGMOID COLECTOMY;  Surgeon: Harriette Bouillonhomas Cornett, MD;  [facility];  Service: General;  Laterality: N/A;  . Colostomy N/A 11/27/2014    Procedure: COLOSTOMY;  Surgeon: Harriette Bouillonhomas Cornett, MD;  [facility];  Service: General;  Laterality: N/A;  . Back surgery    .  Appendectomy    . Hammer toes Bilateral   . Colostomy takedown N/A 04/09/2015    Procedure: LAPAROSCOPIC ASSISTED COLOSTOMY CLOSURE; RIGID SIGMOIDOSCOPY;  Surgeon: Harriette Bouillonhomas Cornett, MD;  Location: [facility] OR;  Service: General;  Laterality: N/A;    Current Outpatient Prescriptions  Medication Sig Dispense Refill  . aspirin 81 MG tablet Take 81 mg by mouth every other day.     Marland Kitchen. atorvastatin (LIPITOR) 10 MG tablet Take 10 mg by mouth every evening.    Marland Kitchen. levothyroxine (SYNTHROID, LEVOTHROID) 100 MCG tablet Take 100 mcg by mouth daily before breakfast.    . lisinopril (PRINIVIL,ZESTRIL) 5 MG tablet Take 5 mg by mouth daily.    . meclizine (ANTIVERT) 12.5 MG tablet Take 12.5 mg by mouth as needed for dizziness.    . pantoprazole (PROTONIX) 40 MG tablet Take 40 mg by mouth daily.     No current facility-administered medications for this visit.   Allergies:  Bactrim and Codeine   Social History: The patient  reports that she quit smoking about 14 months ago. Her smoking use included Cigarettes. She started smoking about 59 years ago. She has a 10 pack-year smoking history. She has never used smokeless tobacco. She reports that she does not drink alcohol or use illicit drugs.   ROS:  Please see the history of present illness. Otherwise, complete review of systems is positive for intermittent diarrhea as before.  All other systems are reviewed and negative.   Physical Exam: VS:  BP 100/70 mmHg  Pulse 80  Ht  (1.6 m)  Wt 115 lb (52.164 kg)  BMI 20.38 kg/m2  SpO2 95%, BMI Body mass index is 20.38 kg/(m^2).  Wt Readings from Last 3 Encounters:  01/30/16 115 lb (52.164 kg)  06/18/15 104 lb (47.174 kg)  04/09/15 108 lb (48.988 kg)    Thin woman in no distress. HEENT: Conjunctiva and lids normal, oropharynx clear with moist mucosa.  Neck: Supple, no elevated JVP or carotid bruits, no thyromegaly.  Lungs: Diminished breath sounds, nonlabored breathing at rest.  Cardiac: Regular rate and  rhythm, no S3, 3/6 systolic murmur with preserved second heart, no pericardial rub.  Abdomen: Soft, nontender, bowel sounds present.  Extremities: No pitting edema, distal pulses 2+.  Skin: Warm and dry.  Musculoskeletal: No kyphosis.  Neuropsychiatric: Alert and oriented x3, affect grossly appropriate.   ECG: I personally reviewed the prior tracing from 11/23/2014 which showed sinus rhythm with poor R-wave progression.  Recent Labwork: 04/01/2015: ALT 12*; AST 20 04/14/2015: BUN <5*; Creatinine, Ser 0.32*; Hemoglobin 10.3*; Platelets 276; Potassium 3.9; Sodium 133*     Component Value Date/Time   TRIG 129 12/02/2014 0215    Other Studies Reviewed Today:  Echocardiogram 06/26/2015: Study Conclusions  - Left ventricle: The cavity size was normal. Wall thickness was  increased in a pattern of moderate LVH. Systolic function was  normal. The estimated ejection fraction was in the range of 60%  to 65%. Wall motion was normal; there were no regional wall  motion abnormalities. Doppler parameters are consistent with  abnormal left ventricular relaxation (grade 1 diastolic  dysfunction). - Aortic valve: Moderately calcified annulus. Trileaflet; severely  thickened leaflets. There was moderate stenosis. There was mild  regurgitation. Mean gradient (S): 24 mm Hg. Valve area (VTI):  1.19 cm^2. Valve area (Vmax): 1.13 cm^2. Valve area (Vmean): 1.07  cm^2. Regurgitation pressure half-time: 572 ms. - Mitral valve: Mildly calcified annulus. Mildly thickened leaflets. - Pulmonary arteries: PA peak pressure: 31 mm Hg (S). PASP is  borderline elevated. - Technically adequate study.  Assessment and Plan:  1. Aortic valve stenosis, moderate range by echocardiogram last September. No change on examination or in terms of symptoms. We will obtain an echocardiogram for follow-up visit in 6 months.  2. Essential hypertension, blood pressure is normal today. She continues on  lisinopril.  3. Hyperlipidemia, on Lipitor. Continues to follow with routine physicals per Dr. Sherril Croon.  4. COPD with history of tobacco abuse in remission.  Current medicines were reviewed with the patient today.   Orders Placed This Encounter  Procedures  . EKG 12-Lead  . ECHOCARDIOGRAM COMPLETE    Disposition: FU with me in 6 months.   Signed, Jonelle Sidle, MD, [facility] 01/30/2016 9:10 AM    [facility] at [facility] [address] [place], [place], [place] [address] Phone: [phone]; Fax: [phone]

## 2016-01-30 NOTE — Patient Instructions (Signed)
Your physician has requested that you have an echocardiogram. Echocardiography is a painless test that uses sound waves to create images of your heart. It provides your doctor with information about the size and shape of your heart and how well your heart's chambers and valves are working. This procedure takes approximately one hour. There are no restrictions for this procedure - DUE JUST PRIOR TO NEXT OFFICE VISIT. Continue all current medications. Your physician wants you to follow up in: 6 months.  You will receive a reminder letter in the mail one-two months in advance.  If you don't receive a letter, please call our office to schedule the follow up appointment

## 2016-07-22 ENCOUNTER — Ambulatory Visit (INDEPENDENT_AMBULATORY_CARE_PROVIDER_SITE_OTHER): Payer: Medicare Other | Admitting: Otolaryngology

## 2016-07-22 DIAGNOSIS — R42 Dizziness and giddiness: Secondary | ICD-10-CM

## 2016-07-22 DIAGNOSIS — J310 Chronic rhinitis: Secondary | ICD-10-CM | POA: Diagnosis not present

## 2016-07-22 DIAGNOSIS — J343 Hypertrophy of nasal turbinates: Secondary | ICD-10-CM

## 2016-07-22 DIAGNOSIS — H903 Sensorineural hearing loss, bilateral: Secondary | ICD-10-CM

## 2016-07-28 ENCOUNTER — Telehealth: Payer: Self-pay | Admitting: *Deleted

## 2016-07-28 ENCOUNTER — Other Ambulatory Visit: Payer: Self-pay

## 2016-07-28 ENCOUNTER — Ambulatory Visit (INDEPENDENT_AMBULATORY_CARE_PROVIDER_SITE_OTHER): Payer: Medicare Other

## 2016-07-28 DIAGNOSIS — I350 Nonrheumatic aortic (valve) stenosis: Secondary | ICD-10-CM

## 2016-07-28 NOTE — Telephone Encounter (Signed)
-----   Message from Jonelle SidleSamuel G McDowell, MD sent at 07/28/2016 12:43 PM EDT ----- Results reviewed. Aortic stenosis in moderate to severe range with mild aortic regurgitation. There has been some progression compared to the prior study. May still be able to observe depending on how she is doing clinically, can discuss further office follow-up. A copy of this test should be forwarded to Ignatius Speckinghruv B Vyas, MD.

## 2016-07-29 NOTE — Telephone Encounter (Signed)
Patient returned call will be home until about 10:30

## 2016-07-29 NOTE — Telephone Encounter (Signed)
Patient called the office stating that she has to go to another appointment. Please call her after lunch today.

## 2016-07-29 NOTE — Telephone Encounter (Signed)
Pt aware, routed to pcp 

## 2016-08-03 NOTE — Progress Notes (Signed)
Cardiology Office Note  Date: 08/04/2016   ID: April BatheShelby J Hull, DOB 05-24-1941, MRN [id number]  PCP: Ignatius Speckinghruv B Vyas, MD  Primary Cardiologist: Nona DellSamuel Naesha Buckalew, MD   Chief Complaint  Patient presents with  . Aortic Stenosis    History of Present Illness: April Hull is a 75 y.o. female last seen in April. She presents for a routine follow-up visit. Reports chronic dyspnea exertion, no substantial increase since last encounter. No exertional chest pain. She has been having some sporadic episodes of lightheadedness are very brief, often when standing up. No palpitations or syncope.  Recent follow-up echocardiogram is outlined below. Aortic stenosis is in the moderate to severe range with mild aortic regurgitation and some progression in valve gradients compared to the previous study. We discussed this today. Overall still in a range where we will continue observation with 6 month follow-up.  She also reports having had a physical with Dr. Sherril CroonVyas and had lab work obtained. We will request results for review.  Past Medical History:  Diagnosis Date  . Aortic stenosis   . Colon polyp   . COPD (chronic obstructive pulmonary disease) (HCC)   . Essential hypertension, benign   . GERD (gastroesophageal reflux disease)   . History of pneumonia   . Hypothyroidism   . Mixed hyperlipidemia   . OA (osteoarthritis of the spine)   . Osteoporosis   . Sigmoid diverticulitis     Past Surgical History:  Procedure Laterality Date  . APPENDECTOMY    . BACK SURGERY    . COLONOSCOPY  10/15/05  . COLOSTOMY N/A 11/27/2014   Procedure: COLOSTOMY;  Surgeon: Harriette Bouillonhomas Cornett, MD;  [facility];  Service: General;  Laterality: N/A;  . COLOSTOMY TAKEDOWN N/A 04/09/2015   Procedure: LAPAROSCOPIC ASSISTED COLOSTOMY CLOSURE; RIGID SIGMOIDOSCOPY;  Surgeon: Harriette Bouillonhomas Cornett, MD;  Location: [facility] OR;  Service: General;  Laterality: N/A;  . ESOPHAGOGASTRODUODENOSCOPY  10/15/05  . hammer toes Bilateral   .  LAPAROSCOPIC SIGMOID COLECTOMY N/A 11/27/2014   Procedure: LAPAROSCOPIC ASSISTED SIGMOID COLECTOMY;  Surgeon: Harriette Bouillonhomas Cornett, MD;  Location: [facility] OR;  Service: General;  Laterality: N/A;  . Left shoulder surgery  2006  . LUMBAR DISC SURGERY     x 2  . VAGINAL HYSTERECTOMY      Current Outpatient Prescriptions  Medication Sig Dispense Refill  . aspirin 81 MG tablet Take 81 mg by mouth every other day.     Marland Kitchen. atorvastatin (LIPITOR) 10 MG tablet Take 10 mg by mouth every evening.    Marland Kitchen. levothyroxine (SYNTHROID, LEVOTHROID) 88 MCG tablet Take 88 mcg by mouth daily before breakfast.    . lisinopril (PRINIVIL,ZESTRIL) 5 MG tablet Take 5 mg by mouth daily.    . meclizine (ANTIVERT) 12.5 MG tablet Take 12.5 mg by mouth as needed for dizziness.    . pantoprazole (PROTONIX) 40 MG tablet Take 40 mg by mouth daily.     No current facility-administered medications for this visit.    Allergies:  Bactrim [sulfamethoxazole-trimethoprim] and Codeine   Social History: The patient  reports that she quit smoking about 20 months ago. Her smoking use included Cigarettes. She started smoking about 60 years ago. She has a 10.00 pack-year smoking history. She has never used smokeless tobacco. She reports that she does not drink alcohol or use drugs.   ROS:  Please see the history of present illness. Otherwise, complete review of systems is positive for sinus drainage, chronic back pain.  All other systems are reviewed and  negative.   Physical Exam: VS:  BP 121/73   Pulse 78   Ht 5\' 3"  (1.6 m)   Wt 120 lb (54.4 kg)   BMI 21.26 kg/m , BMI Body mass index is 21.26 kg/m.  Wt Readings from Last 3 Encounters:  08/04/16 120 lb (54.4 kg)  01/30/16 115 lb (52.2 kg)  06/18/15 104 lb (47.2 kg)    Thin woman in no distress. HEENT: Conjunctiva and lids normal, oropharynx clear.  Neck: Supple, no elevated JVP or carotid bruits, no thyromegaly.  Lungs: Diminished breath sounds, nonlabored breathing at rest.   Cardiac: Regular rate and rhythm, no S3, 3/6 systolic murmur with preserved second heart, no pericardial rub.  Abdomen: Soft, nontender, bowel sounds present.  Extremities: No pitting edema, distal pulses 2+.  Skin: Warm and dry.  Musculoskeletal: No kyphosis.  Neuropsychiatric: Alert and oriented x3, affect grossly appropriate.  ECG: I personally reviewed the tracing from 01/30/2016 which showed sinus rhythm with decreased R wave progression and nonspecific T-wave changes.  Recent Labwork:  June 2016: Hemoglobin 10.3, platelets 276, potassium 3.9, BUN less than 5, creatinine 0.32  Other Studies Reviewed Today:  Echocardiogram 07/28/2016: Study Conclusions  - Left ventricle: The cavity size was normal. Wall thickness was   increased in a pattern of mild LVH. There was moderate focal   basal hypertrophy of the septum. Systolic function was vigorous.   The estimated ejection fraction was in the range of 65% to 70%.   Wall motion was normal; there were no regional wall motion   abnormalities. Doppler parameters are consistent with abnormal   left ventricular relaxation (grade 1 diastolic dysfunction). - Aortic valve: Moderately to severely calcified annulus. Probably   trileaflet; moderately calcified leaflets. There was moderate to   severe stenosis. There was mild regurgitation. Mean gradient (S):   37 mm Hg. Peak gradient (S): 63 mm Hg. VTI ratio of LVOT to   aortic valve: 0.34. Valve area (VTI): 1.06 cm^2. Valve area   (Vmax): 0.98 cm^2. - Mitral valve: Calcified annulus. Mild calcification of the   anterior leaflet. There was trivial regurgitation. - Tricuspid valve: There was mild regurgitation. Peak RV-RA   gradient (S): 19 mm Hg. - Pulmonary arteries: Systolic pressure could not be accurately   estimated. - Pericardium, extracardiac: There was no pericardial effusion.  Impressions:  - Mild LVH with moderate basal septal LV hypertrophy and LVEF   65-70%. Grade  1 diastolic dysfunction. MAC with mildly calcified   anterior mitral leaflet and trivial mitral regurgitation.   Moderate to severe calcific aortic stenosis with mild aortic   regurgitation as outlined above. Valve gradients have increased   in comparison to the previous study. Mild tricuspid   regurgitation, RV-RA gradient 19 mmHg.  Assessment and Plan:  1. Moderate to severe calcific aortic stenosis with mild aortic regurgitation. Overall symptomatically stable. We will plan a follow-up evaluation with echo Kingsley Spittle in 6 months.  2. Essential hypertension. She is having some orthostatic dizzy spells. I asked her to cut lisinopril to 2.5 mg daily for now to see if she notices an improvement.  3. Hyperlipidemia, on Lipitor. Requesting most recent lab work from Dr. Sherril Croon.  Current medicines were reviewed with the patient today.   Orders Placed This Encounter  Procedures  . ECHOCARDIOGRAM COMPLETE    Disposition: Follow-up with me in 6 months.  Signed, Jonelle Sidle, MD, [facility] 08/04/2016 9:30 AM    [facility] at [facility] [address]  [place], [place] [address] Phone: [phone]; Fax: [phone]

## 2016-08-04 ENCOUNTER — Ambulatory Visit (INDEPENDENT_AMBULATORY_CARE_PROVIDER_SITE_OTHER): Payer: Medicare Other | Admitting: Cardiology

## 2016-08-04 ENCOUNTER — Encounter: Payer: Self-pay | Admitting: *Deleted

## 2016-08-04 ENCOUNTER — Encounter: Payer: Self-pay | Admitting: Cardiology

## 2016-08-04 VITALS — BP 121/73 | HR 78 | Ht 63.0 in | Wt 120.0 lb

## 2016-08-04 DIAGNOSIS — E782 Mixed hyperlipidemia: Secondary | ICD-10-CM | POA: Diagnosis not present

## 2016-08-04 DIAGNOSIS — I10 Essential (primary) hypertension: Secondary | ICD-10-CM

## 2016-08-04 DIAGNOSIS — I350 Nonrheumatic aortic (valve) stenosis: Secondary | ICD-10-CM | POA: Diagnosis not present

## 2016-08-04 NOTE — Patient Instructions (Signed)
Medication Instructions:   Decrease Lisinopril to 2.5mg  daily, if no changes in the way you feel - can go back to the 5mg  tablet.  Continue all other medications.    Labwork: none  Testing/Procedures:  Your physician has requested that you have an echocardiogram. Echocardiography is a painless test that uses sound waves to create images of your heart. It provides your doctor with information about the size and shape of your heart and how well your heart's chambers and valves are working. This procedure takes approximately one hour. There are no restrictions for this procedure.  Due just prior to next office visit.  Follow-Up: Your physician wants you to follow up in: 6 months.  You will receive a reminder letter in the mail one-two months in advance.  If you don't receive a letter, please call our office to schedule the follow up appointment   Any Other Special Instructions Will Be Listed Below (If Applicable).  If you need a refill on your cardiac medications before your next appointment, please call your pharmacy.

## 2017-01-31 ENCOUNTER — Ambulatory Visit: Payer: Medicare Other | Admitting: Cardiology

## 2017-01-31 NOTE — Progress Notes (Deleted)
Cardiology Office Note  Date: 01/31/2017   ID: April Hull, DOB 1941-04-15, MRN [id number]  PCP: Glenda Chroman, MD  Primary Cardiologist: Rozann Lesches, MD   No chief complaint on file.   History of Present Illness: April Hull is a 76 y.o. female last seen in October 2017.  Follow-up echocardiogram from October 2017 showed moderate to severe aortic stenosis with mild aortic regurgitation. She is due for a follow-up study.  Past Medical History:  Diagnosis Date  . Aortic stenosis   . Colon polyp   . COPD (chronic obstructive pulmonary disease) ([place])   . Essential hypertension, benign   . GERD (gastroesophageal reflux disease)   . History of pneumonia   . Hypothyroidism   . Mixed hyperlipidemia   . OA (osteoarthritis of the spine)   . Osteoporosis   . Sigmoid diverticulitis     Past Surgical History:  Procedure Laterality Date  . APPENDECTOMY    . BACK SURGERY    . COLONOSCOPY  10/15/05  . COLOSTOMY N/A 11/27/2014   Procedure: COLOSTOMY;  Surgeon: Erroll Luna, MD;  Location: [facility];  Service: General;  Laterality: N/A;  . COLOSTOMY TAKEDOWN N/A 04/09/2015   Procedure: LAPAROSCOPIC ASSISTED COLOSTOMY CLOSURE; RIGID SIGMOIDOSCOPY;  Surgeon: Erroll Luna, MD;  Location: [facility];  Service: General;  Laterality: N/A;  . ESOPHAGOGASTRODUODENOSCOPY  10/15/05  . hammer toes Bilateral   . LAPAROSCOPIC SIGMOID COLECTOMY N/A 11/27/2014   Procedure: LAPAROSCOPIC ASSISTED SIGMOID COLECTOMY;  Surgeon: Erroll Luna, MD;  Location: [place];  Service: General;  Laterality: N/A;  . Left shoulder surgery  2006  . LUMBAR DISC SURGERY     x 2  . VAGINAL HYSTERECTOMY      Current Outpatient Prescriptions  Medication Sig Dispense Refill  . aspirin 81 MG tablet Take 81 mg by mouth every other day.     Marland Kitchen atorvastatin (LIPITOR) 10 MG tablet Take 10 mg by mouth every evening.    Marland Kitchen levothyroxine (SYNTHROID, LEVOTHROID) 88 MCG tablet Take 88 mcg by mouth daily before breakfast.      . lisinopril (PRINIVIL,ZESTRIL) 5 MG tablet Take 5 mg by mouth daily.    . meclizine (ANTIVERT) 12.5 MG tablet Take 12.5 mg by mouth as needed for dizziness.    . pantoprazole (PROTONIX) 40 MG tablet Take 40 mg by mouth daily.     No current facility-administered medications for this visit.    Allergies:  Bactrim [sulfamethoxazole-trimethoprim] and Codeine   Social History: The patient  reports that she quit smoking about 2 years ago. Her smoking use included Cigarettes. She started smoking about 60 years ago. She has a 10.00 pack-year smoking history. She has never used smokeless tobacco. She reports that she does not drink alcohol or use drugs.   Family History: The patient's family history includes Breast cancer in her sister; CAD in her brother; COPD in her father; Emphysema in her father; Heart attack in her mother; Heart disease in her sister; Uterine cancer in her daughter.   ROS:  Please see the history of present illness. Otherwise, complete review of systems is positive for {NONE DEFAULTED:18576::"none"}.  All other systems are reviewed and negative.   Physical Exam: VS:  There were no vitals taken for this visit., BMI There is no height or weight on file to calculate BMI.  Wt Readings from Last 3 Encounters:  08/04/16 120 lb (54.4 kg)  01/30/16 115 lb (52.2 kg)  06/18/15 104 lb (47.2 kg)  Thin woman in no distress. HEENT: Conjunctiva and lids normal, oropharynx clear.  Neck: Supple, no elevated JVP or carotid bruits, no thyromegaly.  Lungs: Diminished breath sounds, nonlabored breathing at rest.  Cardiac: Regular rate and rhythm, no S3, 3/6 systolic murmur with preserved second heart, no pericardial rub.  Abdomen: Soft, nontender, bowel sounds present.  Extremities: No pitting edema, distal pulses 2+.  Skin: Warm and dry.  Musculoskeletal: No kyphosis.  Neuropsychiatric: Alert and oriented x3, affect grossly appropriate.  ECG: I personally reviewed the tracing  from 01/30/2016 which showed sinus rhythm with decreased R wave progression and nonspecific T-wave changes.  Recent Labwork:  August 2017: TSH 1.15  Other Studies Reviewed Today:  Echocardiogram 07/28/2016: Study Conclusions  - Left ventricle: The cavity size was normal. Wall thickness was   increased in a pattern of mild LVH. There was moderate focal   basal hypertrophy of the septum. Systolic function was vigorous.   The estimated ejection fraction was in the range of 65% to 70%.   Wall motion was normal; there were no regional wall motion   abnormalities. Doppler parameters are consistent with abnormal   left ventricular relaxation (grade 1 diastolic dysfunction). - Aortic valve: Moderately to severely calcified annulus. Probably   trileaflet; moderately calcified leaflets. There was moderate to   severe stenosis. There was mild regurgitation. Mean gradient (S):   37 mm Hg. Peak gradient (S): 63 mm Hg. VTI ratio of LVOT to   aortic valve: 0.34. Valve area (VTI): 1.06 cm^2. Valve area   (Vmax): 0.98 cm^2. - Mitral valve: Calcified annulus. Mild calcification of the   anterior leaflet. There was trivial regurgitation. - Tricuspid valve: There was mild regurgitation. Peak RV-RA   gradient (S): 19 mm Hg. - Pulmonary arteries: Systolic pressure could not be accurately   estimated. - Pericardium, extracardiac: There was no pericardial effusion.  Impressions:  - Mild LVH with moderate basal septal LV hypertrophy and LVEF   65-70%. Grade 1 diastolic dysfunction. MAC with mildly calcified   anterior mitral leaflet and trivial mitral regurgitation.   Moderate to severe calcific aortic stenosis with mild aortic   regurgitation as outlined above. Valve gradients have increased   in comparison to the previous study. Mild tricuspid   regurgitation, RV-RA gradient 19 mmHg.  Assessment and Plan:    Current medicines were reviewed with the patient today.  No orders of the defined  types were placed in this encounter.   Disposition:  Signed, Satira Sark, MD, [facility] 01/31/2017 7:37 AM    [place] [place] [facility] [place], [place], [place] [address] Phone: [phone]; Fax: [phone]

## 2017-02-02 ENCOUNTER — Ambulatory Visit (INDEPENDENT_AMBULATORY_CARE_PROVIDER_SITE_OTHER): Payer: Medicare Other

## 2017-02-02 ENCOUNTER — Other Ambulatory Visit: Payer: Self-pay

## 2017-02-02 DIAGNOSIS — I350 Nonrheumatic aortic (valve) stenosis: Secondary | ICD-10-CM

## 2017-02-03 ENCOUNTER — Telehealth: Payer: Self-pay

## 2017-02-03 NOTE — Telephone Encounter (Signed)
-----   Message from Jonelle Sidle, MD sent at 02/03/2017  7:53 AM EDT ----- Results reviewed. Aortic stenosis more definitively in the severe range now compared to prior study. Can discuss further at office follow-up in terms of symptoms and further evaluation. A copy of this test should be forwarded to [name], MD.

## 2017-02-03 NOTE — Telephone Encounter (Signed)
Patient notified. Routed to PCP 

## 2017-02-21 NOTE — Progress Notes (Signed)
Cardiology Office Note  Date: 02/22/2017   ID: April BatheShelby J Tisdel, DOB [birth year], MRN [id number]  PCP: Ignatius SpeckingVyas, Dhruv B, MD  Primary Cardiologist: Nona DellSamuel Trevaris Pennella, MD   Chief Complaint  Patient presents with  . Aortic Stenosis    History of Present Illness: April BatheShelby J Elsayed is a 76 y.o. female last seen in October 2017. She is here today with her daughter for a follow-up visit. From a symptom perspective, she continues to report chronic dyspnea on exertion, feels that it is worse over the last year. Also has intermittent sense of palpitations and brief dizziness, no syncope.  Recent follow-up echocardiogram is outlined below and shows progression of aortic stenosis into severe range. We discussed the results and implications today. Although her symptoms are likely multifactorial, it is certainly hard to imagine that her aortic stenosis is not contributing.  I personally reviewed her ECG today which shows sinus rhythm with nonspecific ST-T changes. She does not have any clearly documented history of arrhythmia.  Today we discussed referral for TAVR evaluation. Patient's daughter stated that she had already read about this procedure. I explained that she would be seen for consultation regarding best way to approach her aortic stenosis, and that she would need further testing in order to make a final decision.  Past Medical History:  Diagnosis Date  . Aortic stenosis   . Colon polyp   . COPD (chronic obstructive pulmonary disease) (HCC)   . Essential hypertension, benign   . GERD (gastroesophageal reflux disease)   . History of pneumonia   . Hypothyroidism   . Mixed hyperlipidemia   . OA (osteoarthritis of the spine)   . Osteoporosis   . Sigmoid diverticulitis     Past Surgical History:  Procedure Laterality Date  . APPENDECTOMY    . BACK SURGERY    . COLONOSCOPY  10/15/05  . COLOSTOMY N/A 11/27/2014   Procedure: COLOSTOMY;  Surgeon: Harriette Bouillonhomas Cornett, MD;  [facility];  Service:  General;  Laterality: N/A;  . COLOSTOMY TAKEDOWN N/A 04/09/2015   Procedure: LAPAROSCOPIC ASSISTED COLOSTOMY CLOSURE; RIGID SIGMOIDOSCOPY;  Surgeon: Harriette Bouillonhomas Cornett, MD;  Location: [facility] OR;  Service: General;  Laterality: N/A;  . ESOPHAGOGASTRODUODENOSCOPY  10/15/05  . hammer toes Bilateral   . LAPAROSCOPIC SIGMOID COLECTOMY N/A 11/27/2014   Procedure: LAPAROSCOPIC ASSISTED SIGMOID COLECTOMY;  Surgeon: Harriette Bouillonhomas Cornett, MD;  Location: [facility] OR;  Service: General;  Laterality: N/A;  . Left shoulder surgery  2006  . LUMBAR DISC SURGERY     x 2  . VAGINAL HYSTERECTOMY      Current Outpatient Prescriptions  Medication Sig Dispense Refill  . aspirin 81 MG tablet Take 81 mg by mouth every other day.     Marland Kitchen. atorvastatin (LIPITOR) 10 MG tablet Take 10 mg by mouth every evening.    Marland Kitchen. levothyroxine (SYNTHROID, LEVOTHROID) 88 MCG tablet Take 88 mcg by mouth daily before breakfast.    . lisinopril (PRINIVIL,ZESTRIL) 5 MG tablet Take 5 mg by mouth daily.    . pantoprazole (PROTONIX) 40 MG tablet Take 40 mg by mouth daily.     No current facility-administered medications for this visit.    Allergies:  Bactrim [sulfamethoxazole-trimethoprim] and Codeine   Social History: The patient  reports that she has been smoking Cigarettes.  She started smoking about 60 years ago. She has a 10.00 pack-year smoking history. She has never used smokeless tobacco. She reports that she does not drink alcohol or use drugs.   ROS:  Please see  the history of present illness. Otherwise, complete review of systems is positive for chronic dyspnea exertion.  All other systems are reviewed and negative.   Physical Exam: VS:  BP 134/73   Pulse 78   Ht 5\' 3"  (1.6 m)   Wt 115 lb 9.6 oz (52.4 kg)   SpO2 97%   BMI 20.48 kg/m , BMI Body mass index is 20.48 kg/m.  Wt Readings from Last 3 Encounters:  02/22/17 115 lb 9.6 oz (52.4 kg)  08/04/16 120 lb (54.4 kg)  01/30/16 115 lb (52.2 kg)    Thin woman in no distress. HEENT:  Conjunctiva and lids normal, oropharynx clear.  Neck: Supple, no elevated JVP or carotid bruits, no thyromegaly.  Lungs: Diminished breath sounds, nonlabored breathing at rest.  Cardiac: Regular rate and rhythm, no S3, 3/6 systolic murmur, no pericardial rub.  Abdomen: Soft, nontender, bowel sounds present.  Extremities: No pitting edema, distal pulses 2+.  Skin: Warm and dry.  Musculoskeletal: No kyphosis.  Neuropsychiatric: Alert and oriented x3, affect grossly appropriate.  ECG: I personally reviewed the tracing from 01/30/2016 which showed sinus rhythm with decreased R wave progression and nonspecific T-wave changes.  Recent Labwork:  August 2017: TSH 1.15  Other Studies Reviewed Today:  Echocardiogram 02/02/2017: Study Conclusions  - Left ventricle: The cavity size was normal. Systolic function was   vigorous. The estimated ejection fraction was in the range of 65%   to 70%. Wall motion was normal; there were no regional wall   motion abnormalities. Mild concentric and severe focal basal   septal hypertrophy. - Aortic valve: Mildly to moderately calcified annulus. Severely   calcified leaflets. There was severe stenosis. There was mild   regurgitation. Peak velocity (S): 405 cm/s. Mean gradient (S): 43   mm Hg. Valve area (VTI): 0.69 cm^2. Valve area (Vmax): 0.77 cm^2.   Valve area (Vmean): 0.74 cm^2. - Mitral valve: Calcified annulus. Mildly calcified leaflets .   There was mild regurgitation. - Right ventricle: Systolic function was mildly reduced. - Tricuspid valve: Mildly thickened leaflets. There was mild   regurgitation.  Assessment and Plan:  1. Progressive aortic stenosis now into the severe range with mean gradient 43 mmHg. Patient has chronic progressive dyspnea on exertion as well as intermittent palpitations and brief episodes of dizziness, no syncope or definite angina. She also has COPD and history of recurrent pneumonia which is also likely  contributing and likely increases her risk for standard AVR. Plan is to have her referred for TAVR evaluation   2. COPD. Patient has a history of tobacco abuse, had quit for year and then started back. We discussed the importance of smoking cessation.  3. Essential hypertension, systolic blood pressure 130s today. She is on lisinopril and follows with Dr. Sherril Croon.  4. Hyperlipidemia, on Lipitor. She continues to follow with Dr. Sherril Croon.  Current medicines were reviewed with the patient today.   Orders Placed This Encounter  Procedures  . Ambulatory referral to Cardiology  . EKG 12-Lead    Disposition: Proceed with TAVR consultation.  Signed, Jonelle Sidle, MD, [facility] 02/22/2017 1:43 PM    [facility] at [facility] [address] [place], [place], [place] [address] Phone: [phone]; Fax: [phone]

## 2017-02-22 ENCOUNTER — Ambulatory Visit (INDEPENDENT_AMBULATORY_CARE_PROVIDER_SITE_OTHER): Payer: Medicare Other | Admitting: Cardiology

## 2017-02-22 ENCOUNTER — Encounter: Payer: Self-pay | Admitting: Cardiology

## 2017-02-22 VITALS — BP 134/73 | HR 78 | Ht 63.0 in | Wt 115.6 lb

## 2017-02-22 DIAGNOSIS — I10 Essential (primary) hypertension: Secondary | ICD-10-CM

## 2017-02-22 DIAGNOSIS — Z720 Tobacco use: Secondary | ICD-10-CM | POA: Diagnosis not present

## 2017-02-22 DIAGNOSIS — E782 Mixed hyperlipidemia: Secondary | ICD-10-CM

## 2017-02-22 DIAGNOSIS — I350 Nonrheumatic aortic (valve) stenosis: Secondary | ICD-10-CM

## 2017-02-22 DIAGNOSIS — J449 Chronic obstructive pulmonary disease, unspecified: Secondary | ICD-10-CM | POA: Diagnosis not present

## 2017-02-22 NOTE — Patient Instructions (Addendum)
Medication Instructions:   Your physician recommends that you continue on your current medications as directed. Please refer to the Current Medication list given to you today.  Labwork: NONE  Testing/Procedures: NONE  Follow-Up:  Your physician recommends that you schedule a follow-up appointment in: pending TAVR.   Any Other Special Instructions Will Be Listed Below (If Applicable).  You have been referred for a TAVR evaluation.  If you need a refill on your cardiac medications before your next appointment, please call your pharmacy.

## 2017-02-24 ENCOUNTER — Encounter: Payer: Self-pay | Admitting: Cardiovascular Disease

## 2017-02-24 ENCOUNTER — Telehealth: Payer: Self-pay | Admitting: *Deleted

## 2017-02-24 NOTE — Telephone Encounter (Signed)
I placed call to pt to schedule TAVR consult appointment.  Left message to call back

## 2017-02-24 NOTE — Telephone Encounter (Signed)
Follow Up ° ° ° ° °Pt is returning call from earlier. Please call. °

## 2017-02-24 NOTE — Telephone Encounter (Signed)
I spoke with April Hull and scheduled her to see Dr. Clifton JamesMcAlhany on May 14,2018 at 12:00

## 2017-02-28 ENCOUNTER — Encounter: Payer: Self-pay | Admitting: *Deleted

## 2017-02-28 ENCOUNTER — Other Ambulatory Visit: Payer: Self-pay | Admitting: *Deleted

## 2017-02-28 ENCOUNTER — Encounter: Payer: Self-pay | Admitting: Cardiovascular Disease

## 2017-02-28 ENCOUNTER — Ambulatory Visit (INDEPENDENT_AMBULATORY_CARE_PROVIDER_SITE_OTHER): Payer: Medicare Other | Admitting: Cardiovascular Disease

## 2017-02-28 VITALS — BP 142/78 | HR 80 | Ht 63.0 in | Wt 115.4 lb

## 2017-02-28 DIAGNOSIS — I350 Nonrheumatic aortic (valve) stenosis: Secondary | ICD-10-CM | POA: Diagnosis not present

## 2017-02-28 NOTE — Progress Notes (Signed)
Chief Complaint  Patient presents with  . New Patient (Initial Visit)    dyspnea, dizziness   Referring Physician: Diona BrownerMcDowell   History of Present Illness: 76 yo female with history of COPD, HTN, Hypothyroidism, HLD and aortic stenosis who is here today as a new consult for discussion regarding her aortic stenosis. She is referred by Dr. Diona BrownerMcDowell. She has been followed for aortic stenosis for several years. She has had progression of dyspnea on exertion and dizziness over the last 6 months. Echo April 2018 with normal LV systolic function, severe aortic valve stenosis with mean gradient 43 mmHG, peak gradient 66 mmHg, AVA 0.69 cm2. Mild MR.   She tells me that she has had episodes of dizziness over the last 3 months. She has had dyspnea with minimal exertion. She has had some LE edema. No chest pain. She is a mother of 2 and 3 grandchildren. Retired from Clinical biochemistcustomer service in a factory. She is a widow.   Primary Care Physician: Ignatius SpeckingVyas, Dhruv B, MD Primary Cardiology: Diona BrownerMcDowell  Past Medical History:  Diagnosis Date  . Aortic stenosis   . Colon polyp   . COPD (chronic obstructive pulmonary disease) (HCC)   . Essential hypertension, benign   . GERD (gastroesophageal reflux disease)   . History of pneumonia   . Hypothyroidism   . Mixed hyperlipidemia   . OA (osteoarthritis of the spine)   . Osteoporosis   . Sigmoid diverticulitis     Past Surgical History:  Procedure Laterality Date  . APPENDECTOMY    . BACK SURGERY    . COLONOSCOPY  10/15/05  . COLOSTOMY N/A 11/27/2014   Procedure: COLOSTOMY;  Surgeon: Harriette Bouillonhomas Cornett, MD;  [facility];  Service: General;  Laterality: N/A;  . COLOSTOMY TAKEDOWN N/A 04/09/2015   Procedure: LAPAROSCOPIC ASSISTED COLOSTOMY CLOSURE; RIGID SIGMOIDOSCOPY;  Surgeon: Harriette Bouillonhomas Cornett, MD;  Location: [facility] OR;  Service: General;  Laterality: N/A;  . ESOPHAGOGASTRODUODENOSCOPY  10/15/05  . hammer toes Bilateral   . LAPAROSCOPIC SIGMOID COLECTOMY N/A 11/27/2014   Procedure: LAPAROSCOPIC ASSISTED SIGMOID COLECTOMY;  Surgeon: Harriette Bouillonhomas Cornett, MD;  Location: [facility] OR;  Service: General;  Laterality: N/A;  . Left shoulder surgery  2006  . LUMBAR DISC SURGERY     x 2  . VAGINAL HYSTERECTOMY      Current Outpatient Prescriptions  Medication Sig Dispense Refill  . aspirin 81 MG tablet Take 81 mg by mouth every other day.     Marland Kitchen. atorvastatin (LIPITOR) 10 MG tablet Take 10 mg by mouth every evening.    Marland Kitchen. levothyroxine (SYNTHROID, LEVOTHROID) 88 MCG tablet Take 88 mcg by mouth daily before breakfast.    . lisinopril (PRINIVIL,ZESTRIL) 5 MG tablet Take 5 mg by mouth daily.    . pantoprazole (PROTONIX) 40 MG tablet Take 40 mg by mouth daily.     No current facility-administered medications for this visit.     Allergies  Allergen Reactions  . Bactrim [Sulfamethoxazole-Trimethoprim] Hives, Itching and Other (See Comments)    Irregular heart beat  . Codeine Palpitations    Dizziness, sweats     Social History   Social History  . Marital status: Married    Spouse name: N/A  . Number of children: N/A  . Years of education: N/A   Occupational History  . retired-worked at Customer Service    Social History Main Topics  . Smoking status: Current Some Day Smoker    Packs/day: 0.25    Years: 40.00    Types: Cigarettes  Start date: 08/04/1956    Last attempt to quit: 11/20/2014  . Smokeless tobacco: Never Used     Comment: 4-5 ciggs per day  . Alcohol use No  . Drug use: No  . Sexual activity: No   Other Topics Concern  . Not on file   Social History Narrative  . No narrative on file    Family History  Problem Relation Age of Onset  . Congestive Heart Failure Mother   . COPD Father   . Emphysema Father   . CAD Brother   . Breast cancer Sister   . Heart disease Sister   . Uterine cancer Daughter     Review of Systems:  As stated in the HPI and otherwise negative.   BP (!) 142/78   Pulse 80   Ht 5\' 3"  (1.6 m)   Wt 115 lb 6.4 oz  (52.3 kg)   SpO2 95%   BMI 20.44 kg/m   Physical Examination: General: Well developed, well nourished, NAD  HEENT: OP clear, mucus membranes moist  SKIN: warm, dry. No rashes. Neuro: No focal deficits  Musculoskeletal: Muscle strength 5/5 all ext  Psychiatric: Mood and affect normal  Neck: No JVD, no carotid bruits, no thyromegaly, no lymphadenopathy.  Lungs:Clear bilaterally, no wheezes, rhonci, crackles Cardiovascular: Regular rate and rhythm.  Loud harsh systolic murmur. No  gallops or rubs. Abdomen:Soft. Bowel sounds present. Non-tender.  Extremities: No lower extremity edema. Pulses are 2 + in the bilateral DP/PT.  Echo 02/02/17: Left ventricle: The cavity size was normal. Systolic function was   vigorous. The estimated ejection fraction was in the range of 65%   to 70%. Wall motion was normal; there were no regional wall   motion abnormalities. Mild concentric and severe focal basal   septal hypertrophy. - Aortic valve: Mildly to moderately calcified annulus. Severely   calcified leaflets. There was severe stenosis. There was mild   regurgitation. Peak velocity (S): 405 cm/s. Mean gradient (S): 43   mm Hg. Peak gradient 66 mmHg. Valve area (VTI): 0.69 cm^2. Valve area (Vmax): 0.77 cm^2.   Valve area (Vmean): 0.74 cm^2. - Mitral valve: Calcified annulus. Mildly calcified leaflets .   There was mild regurgitation. - Right ventricle: Systolic function was mildly reduced. - Tricuspid valve: Mildly thickened leaflets. There was mild   regurgitation.  EKG:  EKG is not ordered today. The ekg from 02/22/17 shows sinus with Non-specific T wave abn. I have personally reviewed this.   Recent Labs: No results found for requested labs within last 8760 hours.   Lipid Panel    Component Value Date/Time   TRIG 129 12/02/2014 0215     Wt Readings from Last 3 Encounters:  02/28/17 115 lb 6.4 oz (52.3 kg)  02/22/17 115 lb 9.6 oz (52.4 kg)  08/04/16 120 lb (54.4 kg)     Other  studies Reviewed: Additional studies/ records that were reviewed today include: Echo. Review of the above records demonstrates: severe AS  STS Risk score:  Risk of Mortality: 3.044%  Morbidity or Mortality: 16.278%  Long Length of Stay: 7.234%  Short Length of Stay: 32.749%  Permanent Stroke: 1.599%  Prolonged Ventilation: 11.221%  DSW Infection: 0.202%  Assessment and Plan:   1. Severe aortic valve stenosis: She has stage D symptomatic aortic valve stenosis. I have personally reviewed the echo images. The aortic valve is thickened, calcified with limited leaflet mobility. I think she would benefit from AVR. She may be a candidate for surgical AVR  or TAVR but given advanced age, I think she would be a better candidate for TAVR. She is a very functional 76 yo lady. I have reviewed the TAVR procedure in detail today with the patient and her family. She would like to proceed with planning for TAVR. I will arrange a right and left heart catheterization at [facility] 03/04/17. Risks and benefits of procedure reviewed with the patient. After the cath, she will be referred to see one of the CT surgeons on our TAVR team. She will then have Cardiac CT and CTA of the chest/abd/pelvis, PFTs and carotid dopplers.   Current medicines are reviewed at length with the patient today.  The patient does not have concerns regarding medicines.  The following changes have been made:  no change  Labs/ tests ordered today include: right and left heart cath  Orders Placed This Encounter  Procedures  . CBC  . Basic Metabolic Panel (BMET)  . INR/PT     Disposition:   FU with the CT surgeons after her cath to continue planning for TAVR.    Signed, Verne Carrow, MD 02/28/2017 1:39 PM    [facility] [address] [place], [place], [place]  [address] Phone: [phone]; Fax: [phone]

## 2017-02-28 NOTE — Patient Instructions (Signed)
Medication Instructions:  Your physician recommends that you continue on your current medications as directed. Please refer to the Current Medication list given to you today.   Labwork: Lab work to be done today--BMP, CBC, PT  Testing/Procedures:  Your physician has requested that you have a cardiac catheterization. Cardiac catheterization is used to diagnose and/or treat various heart conditions. Doctors may recommend this procedure for a number of different reasons. The most common reason is to evaluate chest pain. Chest pain can be a symptom of coronary artery disease (CAD), and cardiac catheterization can show whether plaque is narrowing or blocking your heart's arteries. This procedure is also used to evaluate the valves, as well as measure the blood flow and oxygen levels in different parts of your heart. For further information please visit [email or web address] Please follow instruction sheet, as given. Scheduled for May 18,2018  Follow-Up: To be determined after catheterization.   Any Other Special Instructions Will Be Listed Below (If Applicable).     If you need a refill on your cardiac medications before your next appointment, please call your pharmacy.

## 2017-03-01 LAB — BASIC METABOLIC PANEL
BUN/Creatinine Ratio: 18 (ref 12–28)
BUN: 12 mg/dL (ref 8–27)
CALCIUM: 9.5 mg/dL (ref 8.7–10.3)
CHLORIDE: 95 mmol/L — AB (ref 96–106)
CO2: 25 mmol/L (ref 18–29)
Creatinine, Ser: 0.65 mg/dL (ref 0.57–1.00)
GFR calc Af Amer: 99 mL/min/{1.73_m2} (ref >59)
GFR calc non Af Amer: 86 mL/min/{1.73_m2} (ref >59)
GLUCOSE: 101 mg/dL — AB (ref 65–99)
Potassium: 4 mmol/L (ref 3.5–5.2)
Sodium: 137 mmol/L (ref 134–144)

## 2017-03-01 LAB — CBC
Hematocrit: 38.4 % (ref 34.0–46.6)
Hemoglobin: 13 g/dL (ref 11.1–15.9)
MCH: 30.4 pg (ref 26.6–33.0)
MCHC: 33.9 g/dL (ref 31.5–35.7)
MCV: 90 fL (ref 79–97)
PLATELETS: 299 10*3/uL (ref 150–379)
RBC: 4.28 x10E6/uL (ref 3.77–5.28)
RDW: 14 % (ref 12.3–15.4)
WBC: 9.2 10*3/uL (ref 3.4–10.8)

## 2017-03-01 LAB — PROTIME-INR
INR: 0.9 (ref 0.8–1.2)
PROTHROMBIN TIME: 9.8 s (ref 9.1–12.0)

## 2017-03-04 ENCOUNTER — Encounter (HOSPITAL_COMMUNITY)
Admission: RE | Disposition: A | Discharge status: Home / Self Care | Payer: Self-pay | Source: Ambulatory Visit | Attending: Cardiovascular Disease

## 2017-03-04 ENCOUNTER — Ambulatory Visit (HOSPITAL_COMMUNITY)
Admission: RE | Admit: 2017-03-04 | Discharge: 2017-03-04 | Disposition: A | Discharge status: Home / Self Care | Payer: Medicare Other | Source: Ambulatory Visit | Attending: Cardiovascular Disease | Admitting: Cardiovascular Disease

## 2017-03-04 DIAGNOSIS — E782 Mixed hyperlipidemia: Secondary | ICD-10-CM | POA: Diagnosis not present

## 2017-03-04 DIAGNOSIS — Z7982 Long term (current) use of aspirin: Secondary | ICD-10-CM | POA: Diagnosis not present

## 2017-03-04 DIAGNOSIS — Z8249 Family history of ischemic heart disease and other diseases of the circulatory system: Secondary | ICD-10-CM | POA: Insufficient documentation

## 2017-03-04 DIAGNOSIS — Z882 Allergy status to sulfonamides status: Secondary | ICD-10-CM | POA: Diagnosis not present

## 2017-03-04 DIAGNOSIS — F17210 Nicotine dependence, cigarettes, uncomplicated: Secondary | ICD-10-CM | POA: Insufficient documentation

## 2017-03-04 DIAGNOSIS — I350 Nonrheumatic aortic (valve) stenosis: Secondary | ICD-10-CM | POA: Diagnosis present

## 2017-03-04 DIAGNOSIS — J449 Chronic obstructive pulmonary disease, unspecified: Secondary | ICD-10-CM | POA: Insufficient documentation

## 2017-03-04 DIAGNOSIS — M810 Age-related osteoporosis without current pathological fracture: Secondary | ICD-10-CM | POA: Diagnosis not present

## 2017-03-04 DIAGNOSIS — E039 Hypothyroidism, unspecified: Secondary | ICD-10-CM | POA: Insufficient documentation

## 2017-03-04 DIAGNOSIS — K219 Gastro-esophageal reflux disease without esophagitis: Secondary | ICD-10-CM | POA: Diagnosis not present

## 2017-03-04 DIAGNOSIS — M479 Spondylosis, unspecified: Secondary | ICD-10-CM | POA: Diagnosis not present

## 2017-03-04 DIAGNOSIS — I2510 Atherosclerotic heart disease of native coronary artery without angina pectoris: Secondary | ICD-10-CM | POA: Insufficient documentation

## 2017-03-04 DIAGNOSIS — I10 Essential (primary) hypertension: Secondary | ICD-10-CM | POA: Insufficient documentation

## 2017-03-04 HISTORY — PX: RIGHT/LEFT HEART CATH AND CORONARY ANGIOGRAPHY: CATH118266

## 2017-03-04 LAB — POCT I-STAT 3, ART BLOOD GAS (G3+)
ACID-BASE EXCESS: 7 mmol/L — AB (ref 0.0–2.0)
Acid-Base Excess: 6 mmol/L — ABNORMAL HIGH (ref 0.0–2.0)
BICARBONATE: 31.8 mmol/L — AB (ref 20.0–28.0)
Bicarbonate: 31.8 mmol/L — ABNORMAL HIGH (ref 20.0–28.0)
O2 SAT: 70 %
O2 SAT: 99 %
PCO2 ART: 50.4 mmHg — AB (ref 32.0–48.0)
PO2 ART: 146 mmHg — AB (ref 83.0–108.0)
TCO2: 33 mmol/L (ref 0–100)
TCO2: 33 mmol/L (ref 0–100)
pCO2 arterial: 46.1 mmHg (ref 32.0–48.0)
pH, Arterial: 7.408 (ref 7.350–7.450)
pH, Arterial: 7.446 (ref 7.350–7.450)
pO2, Arterial: 37 mmHg — CL (ref 83.0–108.0)

## 2017-03-04 SURGERY — RIGHT/LEFT HEART CATH AND CORONARY ANGIOGRAPHY
Anesthesia: LOCAL

## 2017-03-04 MED ORDER — LIDOCAINE HCL (PF) 1 % IJ SOLN
INTRAMUSCULAR | Status: AC
  Filled 2017-03-04: qty 30

## 2017-03-04 MED ORDER — MIDAZOLAM HCL 2 MG/2ML IJ SOLN
INTRAMUSCULAR | Status: AC
  Filled 2017-03-04: qty 2

## 2017-03-04 MED ORDER — IOPAMIDOL (ISOVUE-370) INJECTION 76%
INTRAVENOUS | Status: DC | PRN
  Administered 2017-03-04: 50 mL

## 2017-03-04 MED ORDER — VERAPAMIL HCL 2.5 MG/ML IV SOLN
INTRAVENOUS | Status: DC | PRN
  Administered 2017-03-04: 10 mL via INTRA_ARTERIAL

## 2017-03-04 MED ORDER — IOPAMIDOL (ISOVUE-370) INJECTION 76%
INTRAVENOUS | Status: AC
  Filled 2017-03-04: qty 100

## 2017-03-04 MED ORDER — LIDOCAINE HCL (PF) 1 % IJ SOLN
INTRAMUSCULAR | Status: DC | PRN
  Administered 2017-03-04 (×2): 2 mL

## 2017-03-04 MED ORDER — SODIUM CHLORIDE 0.9 % IV SOLN: INTRAVENOUS | Status: AC

## 2017-03-04 MED ORDER — HEPARIN (PORCINE) IN NACL 2-0.9 UNIT/ML-% IJ SOLN
INTRAMUSCULAR | Status: AC
  Filled 2017-03-04: qty 1000

## 2017-03-04 MED ORDER — SODIUM CHLORIDE 0.9% FLUSH: 3.0000 mL | Freq: Two times a day (BID) | INTRAVENOUS | Status: DC

## 2017-03-04 MED ORDER — SODIUM CHLORIDE 0.9% FLUSH: 3.0000 mL | INTRAVENOUS | Status: DC | PRN

## 2017-03-04 MED ORDER — HEPARIN SODIUM (PORCINE) 1000 UNIT/ML IJ SOLN
INTRAMUSCULAR | Status: AC
  Filled 2017-03-04: qty 1

## 2017-03-04 MED ORDER — VERAPAMIL HCL 2.5 MG/ML IV SOLN
INTRAVENOUS | Status: AC
  Filled 2017-03-04: qty 2

## 2017-03-04 MED ORDER — SODIUM CHLORIDE 0.9 % IV SOLN: 250.0000 mL | INTRAVENOUS | Status: DC | PRN

## 2017-03-04 MED ORDER — FENTANYL CITRATE (PF) 100 MCG/2ML IJ SOLN
INTRAMUSCULAR | Status: DC | PRN
  Administered 2017-03-04: 25 ug via INTRAVENOUS

## 2017-03-04 MED ORDER — HEPARIN SODIUM (PORCINE) 1000 UNIT/ML IJ SOLN
INTRAMUSCULAR | Status: DC | PRN
  Administered 2017-03-04: 3000 [IU] via INTRAVENOUS

## 2017-03-04 MED ORDER — SODIUM CHLORIDE 0.9 % IV SOLN
INTRAVENOUS | Status: AC
  Administered 2017-03-04: 13:00:00 via INTRAVENOUS

## 2017-03-04 MED ORDER — ASPIRIN 81 MG PO CHEW: 81.0000 mg | CHEWABLE_TABLET | ORAL | Status: DC

## 2017-03-04 MED ORDER — FENTANYL CITRATE (PF) 100 MCG/2ML IJ SOLN
INTRAMUSCULAR | Status: AC
  Filled 2017-03-04: qty 2

## 2017-03-04 MED ORDER — HEPARIN (PORCINE) IN NACL 2-0.9 UNIT/ML-% IJ SOLN
INTRAMUSCULAR | Status: DC | PRN
  Administered 2017-03-04: 15:00:00

## 2017-03-04 MED ORDER — MIDAZOLAM HCL 2 MG/2ML IJ SOLN
INTRAMUSCULAR | Status: DC | PRN
  Administered 2017-03-04: 1 mg via INTRAVENOUS

## 2017-03-04 SURGICAL SUPPLY — 14 items
CATH 5FR JL3.5 JR4 ANG PIG MP (CATHETERS) ×2 IMPLANT
CATH BALLN WEDGE 5F 110CM (CATHETERS) ×1 IMPLANT
CATH INFINITI 5FR AL1 (CATHETERS) ×1 IMPLANT
DEVICE RAD COMP TR BAND LRG (VASCULAR PRODUCTS) ×1 IMPLANT
GLIDESHEATH SLEND SS 6F .021 (SHEATH) ×1 IMPLANT
GUIDEWIRE INQWIRE 1.5J.035X260 (WIRE) IMPLANT
INQWIRE 1.5J .035X260CM (WIRE) ×2
KIT HEART LEFT (KITS) ×2 IMPLANT
PACK CARDIAC CATHETERIZATION (CUSTOM PROCEDURE TRAY) ×2 IMPLANT
SHEATH GLIDE SLENDER 4/5FR (SHEATH) ×1 IMPLANT
TRANSDUCER W/STOPCOCK (MISCELLANEOUS) ×2 IMPLANT
TUBING CIL FLEX 10 FLL-RA (TUBING) ×2 IMPLANT
WIRE EMERALD ST .035X150CM (WIRE) ×2 IMPLANT
WIRE HI TORQ VERSACORE-J 145CM (WIRE) ×1 IMPLANT

## 2017-03-04 NOTE — H&P (View-Only) (Signed)
Chief Complaint  Patient presents with  . New Patient (Initial Visit)    dyspnea, dizziness   Referring Physician: Diona BrownerMcDowell   History of Present Illness: 76 yo female with history of COPD, HTN, Hypothyroidism, HLD and aortic stenosis who is here today as a new consult for discussion regarding her aortic stenosis. She is referred by Dr. Diona BrownerMcDowell. She has been followed for aortic stenosis for several years. She has had progression of dyspnea on exertion and dizziness over the last 6 months. Echo April 2018 with normal LV systolic function, severe aortic valve stenosis with mean gradient 43 mmHG, peak gradient 66 mmHg, AVA 0.69 cm2. Mild MR.   She tells me that she has had episodes of dizziness over the last 3 months. She has had dyspnea with minimal exertion. She has had some LE edema. No chest pain. She is a mother of 2 and 3 grandchildren. Retired from Clinical biochemistcustomer service in a factory. She is a widow.   Primary Care Physician: Ignatius SpeckingVyas, Dhruv B, MD Primary Cardiology: Diona BrownerMcDowell  Past Medical History:  Diagnosis Date  . Aortic stenosis   . Colon polyp   . COPD (chronic obstructive pulmonary disease) (HCC)   . Essential hypertension, benign   . GERD (gastroesophageal reflux disease)   . History of pneumonia   . Hypothyroidism   . Mixed hyperlipidemia   . OA (osteoarthritis of the spine)   . Osteoporosis   . Sigmoid diverticulitis     Past Surgical History:  Procedure Laterality Date  . APPENDECTOMY    . BACK SURGERY    . COLONOSCOPY  10/15/05  . COLOSTOMY N/A 11/27/2014   Procedure: COLOSTOMY;  Surgeon: Harriette Bouillonhomas Cornett, MD;  [facility];  Service: General;  Laterality: N/A;  . COLOSTOMY TAKEDOWN N/A 04/09/2015   Procedure: LAPAROSCOPIC ASSISTED COLOSTOMY CLOSURE; RIGID SIGMOIDOSCOPY;  Surgeon: Harriette Bouillonhomas Cornett, MD;  Location: [facility] OR;  Service: General;  Laterality: N/A;  . ESOPHAGOGASTRODUODENOSCOPY  10/15/05  . hammer toes Bilateral   . LAPAROSCOPIC SIGMOID COLECTOMY N/A 11/27/2014   Procedure: LAPAROSCOPIC ASSISTED SIGMOID COLECTOMY;  Surgeon: Harriette Bouillonhomas Cornett, MD;  Location: [facility] OR;  Service: General;  Laterality: N/A;  . Left shoulder surgery  2006  . LUMBAR DISC SURGERY     x 2  . VAGINAL HYSTERECTOMY      Current Outpatient Prescriptions  Medication Sig Dispense Refill  . aspirin 81 MG tablet Take 81 mg by mouth every other day.     Marland Kitchen. atorvastatin (LIPITOR) 10 MG tablet Take 10 mg by mouth every evening.    Marland Kitchen. levothyroxine (SYNTHROID, LEVOTHROID) 88 MCG tablet Take 88 mcg by mouth daily before breakfast.    . lisinopril (PRINIVIL,ZESTRIL) 5 MG tablet Take 5 mg by mouth daily.    . pantoprazole (PROTONIX) 40 MG tablet Take 40 mg by mouth daily.     No current facility-administered medications for this visit.     Allergies  Allergen Reactions  . Bactrim [Sulfamethoxazole-Trimethoprim] Hives, Itching and Other (See Comments)    Irregular heart beat  . Codeine Palpitations    Dizziness, sweats     Social History   Social History  . Marital status: Married    Spouse name: N/A  . Number of children: N/A  . Years of education: N/A   Occupational History  . retired-worked at Customer Service    Social History Main Topics  . Smoking status: Current Some Day Smoker    Packs/day: 0.25    Years: 40.00    Types: Cigarettes  Start date: 08/04/1956    Last attempt to quit: 11/20/2014  . Smokeless tobacco: Never Used     Comment: 4-5 ciggs per day  . Alcohol use No  . Drug use: No  . Sexual activity: No   Other Topics Concern  . Not on file   Social History Narrative  . No narrative on file    Family History  Problem Relation Age of Onset  . Congestive Heart Failure Mother   . COPD Father   . Emphysema Father   . CAD Brother   . Breast cancer Sister   . Heart disease Sister   . Uterine cancer Daughter     Review of Systems:  As stated in the HPI and otherwise negative.   BP (!) 142/78   Pulse 80   Ht 5\' 3"  (1.6 m)   Wt 115 lb 6.4 oz  (52.3 kg)   SpO2 95%   BMI 20.44 kg/m   Physical Examination: General: Well developed, well nourished, NAD  HEENT: OP clear, mucus membranes moist  SKIN: warm, dry. No rashes. Neuro: No focal deficits  Musculoskeletal: Muscle strength 5/5 all ext  Psychiatric: Mood and affect normal  Neck: No JVD, no carotid bruits, no thyromegaly, no lymphadenopathy.  Lungs:Clear bilaterally, no wheezes, rhonci, crackles Cardiovascular: Regular rate and rhythm.  Loud harsh systolic murmur. No  gallops or rubs. Abdomen:Soft. Bowel sounds present. Non-tender.  Extremities: No lower extremity edema. Pulses are 2 + in the bilateral DP/PT.  Echo 02/02/17: Left ventricle: The cavity size was normal. Systolic function was   vigorous. The estimated ejection fraction was in the range of 65%   to 70%. Wall motion was normal; there were no regional wall   motion abnormalities. Mild concentric and severe focal basal   septal hypertrophy. - Aortic valve: Mildly to moderately calcified annulus. Severely   calcified leaflets. There was severe stenosis. There was mild   regurgitation. Peak velocity (S): 405 cm/s. Mean gradient (S): 43   mm Hg. Peak gradient 66 mmHg. Valve area (VTI): 0.69 cm^2. Valve area (Vmax): 0.77 cm^2.   Valve area (Vmean): 0.74 cm^2. - Mitral valve: Calcified annulus. Mildly calcified leaflets .   There was mild regurgitation. - Right ventricle: Systolic function was mildly reduced. - Tricuspid valve: Mildly thickened leaflets. There was mild   regurgitation.  EKG:  EKG is not ordered today. The ekg from 02/22/17 shows sinus with Non-specific T wave abn. I have personally reviewed this.   Recent Labs: No results found for requested labs within last 8760 hours.   Lipid Panel    Component Value Date/Time   TRIG 129 12/02/2014 0215     Wt Readings from Last 3 Encounters:  02/28/17 115 lb 6.4 oz (52.3 kg)  02/22/17 115 lb 9.6 oz (52.4 kg)  08/04/16 120 lb (54.4 kg)     Other  studies Reviewed: Additional studies/ records that were reviewed today include: Echo. Review of the above records demonstrates: severe AS  STS Risk score:  Risk of Mortality: 3.044%  Morbidity or Mortality: 16.278%  Long Length of Stay: 7.234%  Short Length of Stay: 32.749%  Permanent Stroke: 1.599%  Prolonged Ventilation: 11.221%  DSW Infection: 0.202%  Assessment and Plan:   1. Severe aortic valve stenosis: She has stage D symptomatic aortic valve stenosis. I have personally reviewed the echo images. The aortic valve is thickened, calcified with limited leaflet mobility. I think she would benefit from AVR. She may be a candidate for surgical AVR  or TAVR but given advanced age, I think she would be a better candidate for TAVR. She is a very functional 76 yo lady. I have reviewed the TAVR procedure in detail today with the patient and her family. She would like to proceed with planning for TAVR. I will arrange a right and left heart catheterization at [facility] 03/04/17. Risks and benefits of procedure reviewed with the patient. After the cath, she will be referred to see one of the CT surgeons on our TAVR team. She will then have Cardiac CT and CTA of the chest/abd/pelvis, PFTs and carotid dopplers.   Current medicines are reviewed at length with the patient today.  The patient does not have concerns regarding medicines.  The following changes have been made:  no change  Labs/ tests ordered today include: right and left heart cath  Orders Placed This Encounter  Procedures  . CBC  . Basic Metabolic Panel (BMET)  . INR/PT     Disposition:   FU with the CT surgeons after her cath to continue planning for TAVR.    Signed, Verne Carrow, MD 02/28/2017 1:39 PM    [facility] [address] [place], [place], [place]  [address] Phone: [phone]; Fax: [phone]

## 2017-03-04 NOTE — Interval H&P Note (Signed)
History and Physical Interval Note:  03/04/2017 12:53 PM  Nestor RampShelby J Reininger  has presented today for cardiac cath with the diagnosis of severe aortic stenosis. The various methods of treatment have been discussed with the patient and family. After consideration of risks, benefits and other options for treatment, the patient has consented to  Procedure(s): Right/Left Heart Cath and Coronary Angiography (N/A) as a surgical intervention .  The patient's history has been reviewed, patient examined, no change in status, stable for surgery.  I have reviewed the patient's chart and labs.  Questions were answered to the patient's satisfaction.    Cath Lab Visit (complete for each Cath Lab visit)  Clinical Evaluation Leading to the Procedure:   ACS: No.  Non-ACS:    Anginal Classification: CCS II  Anti-ischemic medical therapy: No Therapy  Non-Invasive Test Results: No non-invasive testing performed  Prior CABG: No previous CABG         Verne Carrowhristopher Kayleah Appleyard

## 2017-03-04 NOTE — Discharge Instructions (Signed)
Radial Site Care °Refer to this sheet in the next few weeks. These instructions provide you with information about caring for yourself after your procedure. Your health care provider may also give you more specific instructions. Your treatment has been planned according to current medical practices, but problems sometimes occur. Call your health care provider if you have any problems or questions after your procedure. °What can I expect after the procedure? °After your procedure, it is typical to have the following: °· Bruising at the radial site that usually fades within 1-2 weeks. °· Blood collecting in the tissue (hematoma) that may be painful to the touch. It should usually decrease in size and tenderness within 1-2 weeks. °Follow these instructions at home: °· Take medicines only as directed by your health care provider. °· You may shower 24-48 hours after the procedure or as directed by your health care provider. Remove the bandage (dressing) and gently wash the site with plain soap and water. Pat the area dry with a clean towel. Do not rub the site, because this may cause bleeding. °· Do not take baths, swim, or use a hot tub until your health care provider approves. °· Check your insertion site every day for redness, swelling, or drainage. °· Do not apply powder or lotion to the site. °· Do not flex or bend the affected arm for 24 hours or as directed by your health care provider. °· Do not push or pull heavy objects with the affected arm for 24 hours or as directed by your health care provider. °· Do not lift over 10 lb (4.5 kg) for 5 days after your procedure or as directed by your health care provider. °· Ask your health care provider when it is okay to: °¨ Return to work or school. °¨ Resume usual physical activities or sports. °¨ Resume sexual activity. °· Do not drive home if you are discharged the same day as the procedure. Have someone else drive you. °· You may drive 24 hours after the procedure  unless otherwise instructed by your health care provider. °· Do not operate machinery or power tools for 24 hours after the procedure. °· If your procedure was done as an outpatient procedure, which means that you went home the same day as your procedure, a responsible adult should be with you for the first 24 hours after you arrive home. °· Keep all follow-up visits as directed by your health care provider. This is important. °Contact a health care provider if: °· You have a fever. °· You have chills. °· You have increased bleeding from the radial site. Hold pressure on the site. CALL 911 °Get help right away if: °· You have unusual pain at the radial site. °· You have redness, warmth, or swelling at the radial site. °· You have drainage (other than a small amount of blood on the dressing) from the radial site. °· The radial site is bleeding, and the bleeding does not stop after 30 minutes of holding steady pressure on the site. °· Your arm or hand becomes pale, cool, tingly, or numb. °This information is not intended to replace advice given to you by your health care provider. Make sure you discuss any questions you have with your health care provider. °Document Released: 11/06/2010 Document Revised: 03/11/2016 Document Reviewed: 04/22/2014 °Elsevier Interactive Patient Education © 2017 [employer] Inc[employer] ° ° °

## 2017-03-07 ENCOUNTER — Encounter (HOSPITAL_COMMUNITY): Payer: Self-pay | Admitting: Cardiovascular Disease

## 2017-03-09 ENCOUNTER — Ambulatory Visit (HOSPITAL_COMMUNITY)
Admission: RE | Admit: 2017-03-09 | Discharge: 2017-03-09 | Disposition: A | Discharge status: Home / Self Care | Payer: Medicare Other | Source: Ambulatory Visit | Attending: Cardiovascular Disease | Admitting: Cardiovascular Disease

## 2017-03-09 ENCOUNTER — Ambulatory Visit (HOSPITAL_COMMUNITY): Payer: Medicare Other

## 2017-03-09 ENCOUNTER — Ambulatory Visit (HOSPITAL_COMMUNITY): Admission: RE | Admit: 2017-03-09 | Payer: Medicare Other | Source: Ambulatory Visit

## 2017-03-09 ENCOUNTER — Ambulatory Visit (HOSPITAL_BASED_OUTPATIENT_CLINIC_OR_DEPARTMENT_OTHER)
Admission: RE | Admit: 2017-03-09 | Discharge: 2017-03-09 | Disposition: A | Discharge status: Home / Self Care | Payer: Medicare Other | Source: Ambulatory Visit | Attending: Cardiovascular Disease | Admitting: Cardiovascular Disease

## 2017-03-09 ENCOUNTER — Encounter (HOSPITAL_COMMUNITY): Payer: Self-pay

## 2017-03-09 DIAGNOSIS — I350 Nonrheumatic aortic (valve) stenosis: Secondary | ICD-10-CM

## 2017-03-09 DIAGNOSIS — I700 Atherosclerosis of aorta: Secondary | ICD-10-CM | POA: Insufficient documentation

## 2017-03-09 DIAGNOSIS — I2510 Atherosclerotic heart disease of native coronary artery without angina pectoris: Secondary | ICD-10-CM | POA: Insufficient documentation

## 2017-03-09 DIAGNOSIS — I6523 Occlusion and stenosis of bilateral carotid arteries: Secondary | ICD-10-CM | POA: Insufficient documentation

## 2017-03-09 DIAGNOSIS — J449 Chronic obstructive pulmonary disease, unspecified: Secondary | ICD-10-CM | POA: Insufficient documentation

## 2017-03-09 LAB — PULMONARY FUNCTION TEST
DL/VA % PRED: 65 %
DL/VA: 3.07 ml/min/mmHg/L
DLCO UNC % PRED: 51 %
DLCO unc: 11.8 ml/min/mmHg
FEF 25-75 Pre: 0.53 L/sec
FEF2575-%PRED-PRE: 36 %
FEV1-%PRED-PRE: 58 %
FEV1-Pre: 1.13 L
FEV1FVC-%Pred-Pre: 79 %
FEV6-%PRED-PRE: 76 %
FEV6-Pre: 1.87 L
FEV6FVC-%PRED-PRE: 102 %
FVC-%Pred-Pre: 74 %
FVC-Pre: 1.92 L
PRE FEV6/FVC RATIO: 98 %
Pre FEV1/FVC ratio: 59 %
RV % PRED: 137 %
RV: 3.14 L
TLC % pred: 102 %
TLC: 5.04 L

## 2017-03-09 LAB — VAS US CAROTID
LCCAPDIAS: 13 cm/s
LCCAPSYS: 60 cm/s
LEFT ECA DIAS: -9 cm/s
LEFT VERTEBRAL DIAS: -15 cm/s
LICADDIAS: -34 cm/s
LICAPSYS: -109 cm/s
Left CCA dist dias: -24 cm/s
Left CCA dist sys: -99 cm/s
Left ICA dist sys: -92 cm/s
Left ICA prox dias: -38 cm/s
RCCADSYS: -90 cm/s
RCCAPDIAS: -23 cm/s
RIGHT ECA DIAS: -4 cm/s
RIGHT VERTEBRAL DIAS: 13 cm/s
Right CCA prox sys: -83 cm/s

## 2017-03-09 MED ORDER — IOPAMIDOL (ISOVUE-370) INJECTION 76%
INTRAVENOUS | Status: AC
  Administered 2017-03-09: 75 mL
  Filled 2017-03-09: qty 100

## 2017-03-09 MED ORDER — IOPAMIDOL (ISOVUE-370) INJECTION 76%
INTRAVENOUS | Status: AC
  Administered 2017-03-09: 75 mL
  Filled 2017-03-09: qty 50

## 2017-03-09 MED ORDER — METOPROLOL TARTRATE 5 MG/5ML IV SOLN
INTRAVENOUS | Status: AC
  Administered 2017-03-09: 5 mg via INTRAVENOUS
  Filled 2017-03-09: qty 5

## 2017-03-09 MED ORDER — METOPROLOL TARTRATE 5 MG/5ML IV SOLN
5.0000 mg | Freq: Once | INTRAVENOUS | Status: AC
  Administered 2017-03-09: 5 mg via INTRAVENOUS
  Filled 2017-03-09: qty 5

## 2017-03-09 NOTE — Progress Notes (Signed)
VASCULAR LAB PRELIMINARY  PRELIMINARY  PRELIMINARY  PRELIMINARY  Carotid duplex completed.    Preliminary report:  Bilateral:  1-39% ICA stenosis.  Vertebral artery flow is antegrade.     Tynisa Vohs, RVS 03/09/2017, 2:30 PM

## 2017-03-10 ENCOUNTER — Encounter: Payer: Self-pay | Admitting: Thoracic Surgery (Cardiothoracic Vascular Surgery)

## 2017-03-10 ENCOUNTER — Ambulatory Visit: Payer: Medicare Other | Attending: Thoracic Surgery (Cardiothoracic Vascular Surgery) | Admitting: Physical Therapy

## 2017-03-10 ENCOUNTER — Institutional Professional Consult (permissible substitution) (INDEPENDENT_AMBULATORY_CARE_PROVIDER_SITE_OTHER): Payer: Medicare Other | Admitting: Thoracic Surgery (Cardiothoracic Vascular Surgery)

## 2017-03-10 ENCOUNTER — Encounter: Payer: Self-pay | Admitting: Physical Therapy

## 2017-03-10 VITALS — BP 125/74 | HR 61 | Resp 20 | Ht 63.0 in | Wt 108.0 lb

## 2017-03-10 DIAGNOSIS — I350 Nonrheumatic aortic (valve) stenosis: Secondary | ICD-10-CM | POA: Diagnosis not present

## 2017-03-10 DIAGNOSIS — M6281 Muscle weakness (generalized): Secondary | ICD-10-CM | POA: Diagnosis present

## 2017-03-10 DIAGNOSIS — R293 Abnormal posture: Secondary | ICD-10-CM | POA: Diagnosis present

## 2017-03-10 DIAGNOSIS — R2689 Other abnormalities of gait and mobility: Secondary | ICD-10-CM | POA: Insufficient documentation

## 2017-03-10 NOTE — Therapy (Signed)
[facility] [address] [place], [place], [address] Phone: [phone]   Fax:  [phone]  Physical Therapy Evaluation  Patient Details  Name: April Hull MRN: [id number] Date of Birth: 05-16-1941 Referring Provider: Dr. Tressie Stalker  Encounter Date: 03/10/2017      PT End of Session - 03/10/17 1431    Visit Number 1   PT Start Time 1428   PT Stop Time 1509   PT Time Calculation (min) 41 min      Past Medical History:  Diagnosis Date  . Aortic stenosis   . Colon polyp   . COPD (chronic obstructive pulmonary disease) (HCC)   . Essential hypertension, benign   . GERD (gastroesophageal reflux disease)   . History of pneumonia   . Hypothyroidism   . Mixed hyperlipidemia   . OA (osteoarthritis of the spine)   . Osteoporosis   . Sigmoid diverticulitis     Past Surgical History:  Procedure Laterality Date  . APPENDECTOMY    . BACK SURGERY    . COLONOSCOPY  10/15/05  . COLOSTOMY N/A 11/27/2014   Procedure: COLOSTOMY;  Surgeon: Harriette Bouillon, MD;  Location: [facility] OR;  Service: General;  Laterality: N/A;  . COLOSTOMY TAKEDOWN N/A 04/09/2015   Procedure: LAPAROSCOPIC ASSISTED COLOSTOMY CLOSURE; RIGID SIGMOIDOSCOPY;  Surgeon: Harriette Bouillon, MD;  Location: [facility] OR;  Service: General;  Laterality: N/A;  . ESOPHAGOGASTRODUODENOSCOPY  10/15/05  . hammer toes Bilateral   . LAPAROSCOPIC SIGMOID COLECTOMY N/A 11/27/2014   Procedure: LAPAROSCOPIC ASSISTED SIGMOID COLECTOMY;  Surgeon: Harriette Bouillon, MD;  Location: [facility] OR;  Service: General;  Laterality: N/A;  . Left shoulder surgery  2006  . LUMBAR DISC SURGERY     x 2  . RIGHT/LEFT HEART CATH AND CORONARY ANGIOGRAPHY N/A 03/04/2017   Procedure: Right/Left Heart Cath and Coronary Angiography;  Surgeon: Kathleene Hazel, MD;  Location: [facility] INVASIVE CV LAB;  Service: Cardiovascular;  Laterality: N/A;  . VAGINAL HYSTERECTOMY      There were no vitals filed for this  visit.       Subjective Assessment - 03/10/17 1433    Subjective Pt reports intermittent sudden onset of dizziness and shortness of breath over the past 6 months. She also report noticing palpitations more recently.    Patient Stated Goals fix the heart valve   Currently in Pain? Yes   Pain Score 4    Pain Location Back   Pain Descriptors / Indicators Aching   Aggravating Factors  coughing   Pain Relieving Factors rest     PT monitored pain throughout eval. No further complaints.        [facility] PT Assessment - 03/10/17 0001      Assessment   Medical Diagnosis severe aortic stenosis   Referring Provider Dr. Tressie Stalker   Onset Date/Surgical Date --  6 months     Precautions   Precautions None     Restrictions   Weight Bearing Restrictions No     Balance Screen   Has the patient fallen in the past 6 months No   Has the patient had a decrease in activity level because of a fear of falling?  No   Is the patient reluctant to leave their home because of a fear of falling?  No     Home Environment   Living Environment Private residence   Living Arrangements Alone   Home Access Stairs to enter   Entrance Stairs-Number of Steps 2   Entrance  Stairs-Rails --  post on each side, son planning to add rails   Home Layout One level     Prior Function   Level of Independence Independent with household mobility without device;Independent with community mobility without device     Posture/Postural Control   Posture/Postural Control Postural limitations   Postural Limitations Rounded Shoulders;Forward head     ROM / Strength   AROM / PROM / Strength AROM;Strength     AROM   Overall AROM Comments grossly WNL     Strength   Overall Strength Comments grossly 4/5   Strength Assessment Site Hand   Right/Left hand Right;Left   Right Hand Grip (lbs) 41  R hand dominant   Left Hand Grip (lbs) 41     Ambulation/Gait   Gait Comments No significant gait deviations and no  shortness of breath reported during 6 minute walk test. Pt's O2 did drop to 89% during 6 minute walk and took about a minute to recover. Pt limited in gait distance by 33% for age/gender.           [facility] Pre-Surgical Assessment - 03/10/17 0001    5 Meter Walk Test- trial 1 5 sec   5 Meter Walk Test- trial 2 6 sec.    5 Meter Walk Test- trial 3 5 sec.   5 meter walk test average 5.33 sec   4 Stage Balance Test tolerated for:  7 sec.   4 Stage Balance Test Position 3   Comment unable without UE support   ADL/IADL Independent with: Bathing;Dressing;Meal prep;Finances   ADL/IADL Needs Assistance with: Pincus Badder work   ADL/IADL Fraility Index Vulnerable   6 Minute Walk- Baseline yes   BP (mmHg) 104/67   HR (bpm) 87   02 Sat (%RA) 96 %   Modified Borg Scale for Dyspnea 0- Nothing at all   Perceived Rate of Exertion (Borg) 6-   6 Minute Walk Post Test yes   BP (mmHg) 138/69   HR (bpm) 102   02 Sat (%RA) 89 %   Modified Borg Scale for Dyspnea 0- Nothing at all   Perceived Rate of Exertion (Borg) 7- Very, very light   Aerobic Endurance Distance Walked 1035                         PT Education - 03/10/17 1527    Education provided Yes   Education Details fall risk   Person(s) Educated Patient   Methods Explanation   Comprehension Verbalized understanding                    Plan - 03/10/17 1527    Clinical Impression Statement see below   PT Frequency One time visit   Consulted and Agree with Plan of Care Patient     Clinical Impression Statement: Pt is a 76 yo female presenting to OP PT for evaluation prior to possible TAVR surgery due to severe aortic stenosis. Pt reports onset of shortness of breath and dizziness approximately 6 months ago.  Pt presents with good ROM and mild weakness, fair balance and is at high fall risk 4 stage balance test, good walking speed and fair aerobic endurance per 6 minute walk test. Pt ambulated a total of 1035 feet in 6  minute walk. O2 dropped to 89% and recovered within 1 minute.  Based on the Short Physical Performance Battery, patient has a frailty rating of 7/12 with </= 5/12 considered frail.  Patient demonstrated the following deficits and impairments:     Visit Diagnosis: Other abnormalities of gait and mobility  Abnormal posture  Muscle weakness (generalized)      G-Codes - 03/10/17 1528    Functional Assessment Tool Used (Outpatient Only) 6 minute walk 1035'   Functional Limitation Mobility: Walking and moving around   Mobility: Walking and Moving Around Current Status [phone]([id number]) At least 20 percent but less than 40 percent impaired, limited or restricted   Mobility: Walking and Moving Around Goal Status [phone](G8979) At least 20 percent but less than 40 percent impaired, limited or restricted   Mobility: Walking and Moving Around Discharge Status [phone]([id number]) At least 20 percent but less than 40 percent impaired, limited or restricted       Problem List Patient Active Problem List   Diagnosis Date Noted  . Severe aortic stenosis   . Colostomy in place [facility](HCC) 04/09/2015  . Hyponatremia 12/01/2014  . Protein-calorie malnutrition, severe (HCC) 11/26/2014  . 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 Large bowel obstruction (HCC) 11/22/2014  . Pre-operative cardiovascular examination 11/22/2014  . Chronic cough 02/11/2014  . Upper airway cough syndrome 02/11/2014  . GERD (gastroesophageal reflux disease) 02/11/2014  . Aortic stenosis-moderate 02/02/2013  . Essential hypertension, benign 02/02/2013  . Tobacco abuse 02/02/2013  . COPD (chronic obstructive pulmonary disease) (HCC) 02/02/2013    Levie Wages, PT 03/10/2017, 3:29 PM  [facility] [address] [place], [place], [address] Phone: [phone]   Fax:  [phone]  Name: April Hull MRN: [id number] Date of Birth: December 04, 1940

## 2017-03-10 NOTE — Progress Notes (Signed)
[facility]  MULTIDISCIPLINARY HEART VALVE CLINIC  CARDIOTHORACIC SURGERY CONSULTATION REPORT  Referring Provider is Jonelle Sidle, MD PCP is [name], MD  Chief Complaint  Patient presents with  . Aortic Stenosis    Surgical eval for TAVR, review all studies    HPI:  Patient is a 76 year old female with history of aortic stenosis, hypertension, long-standing tobacco abuse with COPD, hypothyroidism, hyperlipidemia, and osteoarthritis who has been referred for surgical consultation to discuss treatment options for management of severe symptomatic aortic stenosis. The patient states that she has known about the presence of a heart murmur for many years. For the last several years she has been followed by Dr. Diona Browner with known history of aortic stenosis. Previous echocardiograms have documented the presence of moderate aortic stenosis with normal left ventricular function, and clinically the patient has done well until recently. Over the past several months the patient has developed progressive symptoms of exertional shortness of breath, palpitations, lower extremity edema, and occasional dizzy spells.  Follow-up transthoracic echocardiogram performed 02/02/2017 revealed significant progression of disease with severe aortic stenosis and normal left ventricular systolic function. Peak velocity across the aortic valve measured greater than 4 m/s corresponding to mean transvalvular gradient estimated 43 mmHg. Ejection fraction was estimated 65-70%. The patient was referred to Dr. Clifton James and underwent diagnostic cardiac catheterization on 03/04/2017. Catheterization confirmed the presence of severe aortic stenosis with peak to peak and mean transvalvular gradients measured 47 and 37.5 mmHg respectively. Aortic valve area was calculated 0.70 cm. The patient had mild nonobstructive coronary artery disease and normal right-sided pressures.  The patient subsequently underwent CT  angiography and pulmonary function testing and has been referred for elective surgical consultation.  The patient is widowed and lives alone in [place], [place].  She has remained reasonably active physically and functionally independent. She drives an automobile and tends to her own personal needs. She has 2 adult children including a daughter who lives in [place] and a son who lives in [place]. The patient describes a several month history of progressive symptoms of exertional shortness of breath, fatigue, decreased energy, loss of appetite, worsening lower extremity edema, and intermittent dizzy spells. She has never had any chest pain or chest tightness either with activity or at rest. She denies any history of resting shortness of breath, PND, or orthopnea. She describes occasional palpitations.  She has relatively mild arthritis and her mobility is reasonably good for her age.  She admits that she still smokes cigarettes although she has cut back significantly by her account.  Past Medical History:  Diagnosis Date  . Aortic stenosis   . Colon polyp   . COPD (chronic obstructive pulmonary disease) (HCC)   . Essential hypertension, benign   . GERD (gastroesophageal reflux disease)   . History of pneumonia   . Hypothyroidism   . Mixed hyperlipidemia   . OA (osteoarthritis of the spine)   . Osteoporosis   . Sigmoid diverticulitis     Past Surgical History:  Procedure Laterality Date  . APPENDECTOMY    . BACK SURGERY    . COLONOSCOPY  10/15/05  . COLOSTOMY N/A 11/27/2014   Procedure: COLOSTOMY;  Surgeon: Harriette Bouillon, MD;  Location: [facility] OR;  Service: General;  Laterality: N/A;  . COLOSTOMY TAKEDOWN N/A 04/09/2015   Procedure: LAPAROSCOPIC ASSISTED COLOSTOMY CLOSURE; RIGID SIGMOIDOSCOPY;  Surgeon: Harriette Bouillon, MD;  Location: [facility] OR;  Service: General;  Laterality: N/A;  . ESOPHAGOGASTRODUODENOSCOPY  10/15/05  . hammer  toes Bilateral   . LAPAROSCOPIC SIGMOID COLECTOMY  N/A 11/27/2014   Procedure: LAPAROSCOPIC ASSISTED SIGMOID COLECTOMY;  Surgeon: Harriette Bouillon, MD;  Location: [facility] OR;  Service: General;  Laterality: N/A;  . Left shoulder surgery  2006  . LUMBAR DISC SURGERY     x 2  . RIGHT/LEFT HEART CATH AND CORONARY ANGIOGRAPHY N/A 03/04/2017   Procedure: Right/Left Heart Cath and Coronary Angiography;  Surgeon: Kathleene Hazel, MD;  Location: [facility] INVASIVE CV LAB;  Service: Cardiovascular;  Laterality: N/A;  . VAGINAL HYSTERECTOMY      Family History  Problem Relation Age of Onset  . Congestive Heart Failure Mother   . COPD Father   . Emphysema Father   . CAD Brother   . Breast cancer Sister   . Heart disease Sister   . Uterine cancer Daughter     Social History   Social History  . Marital status: Widowed    Spouse name: N/A  . Number of children: N/A  . Years of education: N/A   Occupational History  . retired-worked at [employer]    Social History Main Topics  . Smoking status: Current Some Day Smoker    Packs/day: 0.25    Years: 40.00    Types: Cigarettes    Start date: 08/04/1956    Last attempt to quit: 11/20/2014  . Smokeless tobacco: Never Used     Comment: 4-5 ciggs per day  . Alcohol use No  . Drug use: No  . Sexual activity: No   Other Topics Concern  . Not on file   Social History Narrative  . No narrative on file    Current Outpatient Prescriptions  Medication Sig Dispense Refill  . aspirin 81 MG tablet Take 81 mg by mouth every other day.     Marland Kitchen atorvastatin (LIPITOR) 10 MG tablet Take 10 mg by mouth at bedtime.     . Calcium Carbonate-Vitamin D (CALCIUM PLUS VITAMIN D PO) Take 1 tablet by mouth every other day.     . levothyroxine (SYNTHROID, LEVOTHROID) 88 MCG tablet Take 88 mcg by mouth daily before breakfast.    . lisinopril (PRINIVIL,ZESTRIL) 5 MG tablet Take 5 mg by mouth daily.    . Multiple Vitamin (MULTIVITAMIN WITH MINERALS) TABS tablet Take 1 tablet by mouth every other day.    .  pantoprazole (PROTONIX) 40 MG tablet Take 40 mg by mouth daily.     No current facility-administered medications for this visit.     Allergies  Allergen Reactions  . Bactrim [Sulfamethoxazole-Trimethoprim] Hives, Itching and Other (See Comments)    Irregular heart beat  . Codeine Palpitations    Dizziness, sweats       Review of Systems:   General:  decreased appetite, decreased energy, no weight gain, + weight loss, no fever  Cardiac:  no chest pain with exertion, no chest pain at rest, + SOB with exertion, no resting SOB, no PND, no orthopnea, + palpitations, no arrhythmia, no atrial fibrillation, + LE edema, + dizzy spells, no syncope  Respiratory:  + shortness of breath, no home oxygen, no productive cough, + chronic dry cough, no bronchitis, + wheezing, no hemoptysis, no asthma, no pain with inspiration or cough, no sleep apnea, no CPAP at night  GI:   no difficulty swallowing, no reflux, no frequent heartburn, no hiatal hernia, no abdominal pain, no constipation, + diarrhea, no hematochezia, no hematemesis, no melena  GU:   no dysuria,  no frequency, no urinary tract  infection, no hematuria, no kidney stones, no kidney disease  Vascular:  no pain suggestive of claudication, no pain in feet, + leg cramps, no varicose veins, no DVT, no non-healing foot ulcer  Neuro:   no stroke, no TIA's, no seizures, no headaches, no temporary blindness one eye,  no slurred speech, no peripheral neuropathy, no chronic pain, no instability of gait, no memory/cognitive dysfunction  Musculoskeletal: + arthritis, mild joint swelling, no myalgias, no difficulty walking, normal mobility   Skin:   no rash, no itching, no skin infections, no pressure sores or ulcerations  Psych:   no anxiety, no depression, + nervousness, no unusual recent stress  Eyes:   no blurry vision, no floaters, + recent vision changes, no wears glasses or contacts  ENT:   + hearing loss, no loose or painful teeth, edentulous with  full dentures  Hematologic:  + easy bruising, no abnormal bleeding, no clotting disorder, no frequent epistaxis  Endocrine:  no diabetes, does not check CBG's at home           Physical Exam:   BP 125/74   Pulse 61   Resp 20   Ht 5\' 3"  (1.6 m)   Wt 108 lb (49 kg)   SpO2 95% Comment: RA  BMI 19.13 kg/m   General:  Elderly, somewhat frail-appearing  HEENT:  Unremarkable   Neck:   no JVD, no bruits, no adenopathy   Chest:   clear to auscultation, symmetrical breath sounds, no wheezes, no rhonchi   CV:   RRR, grade IV/VI crescendo/decrescendo murmur heard best at RSB,  no diastolic murmur  Abdomen:  soft, non-tender, no masses   Extremities:  warm, well-perfused, pulses diminished but palpable, no LE edema  Rectal/GU  Deferred  Neuro:   Grossly non-focal and symmetrical throughout  Skin:   Clean and dry, no rashes, no breakdown   Diagnostic Tests:  Transthoracic Echocardiography  Patient:    April Hull, April Hull MR #:       [id number] Study Date: 02/02/2017 Gender:     F Age:        65 Height:     160 cm Weight:     54.4 kg BSA:        1.56 m^2 Pt. Status: Room:   Lindi Adie, M.D.  REFERRING    [name], M.D.  SONOGRAPHER  [address] RVT, RDCS  ATTENDING    Prentice Docker, MD  PERFORMING   [facility] [place]  cc:  ------------------------------------------------------------------- LV EF: 65% -   70%  ------------------------------------------------------------------- History:   PMH:   Dyspnea. SOB seems a bit worse, per patient. She has had a difficult &quot;health year&quot; and is somewhat deconditioned as well.  Chronic obstructive pulmonary disease.  Risk factors: Lifelong nonsmoker. Hypertension.  ------------------------------------------------------------------- Study Conclusions  - Left ventricle: The cavity size was normal. Systolic function was   vigorous. The estimated ejection fraction was in the range of 65%   to  70%. Wall motion was normal; there were no regional wall   motion abnormalities. Mild concentric and severe focal basal   septal hypertrophy. - Aortic valve: Mildly to moderately calcified annulus. Severely   calcified leaflets. There was severe stenosis. There was mild   regurgitation. Peak velocity (S): 405 cm/s. Mean gradient (S): 43   mm Hg. Valve area (VTI): 0.69 cm^2. Valve area (Vmax): 0.77 cm^2.   Valve area (Vmean): 0.74 cm^2. - Mitral valve: Calcified annulus. Mildly calcified leaflets .  There was mild regurgitation. - Right ventricle: Systolic function was mildly reduced. - Tricuspid valve: Mildly thickened leaflets. There was mild   regurgitation.  ------------------------------------------------------------------- Study data:  Comparison was made to the study of 07/28/2016. Previous Echo showed moderate to severe AS with mild AI. Mean gradient was 37 mmHg. Mild LVH with basal septal hypertrophy, LVEF 65-70%, Grade 1 DD.  Study status:  Routine.  Procedure: Transthoracic echocardiography. Image quality was adequate.  Study completion:  There were no complications.          Transthoracic echocardiography.  M-mode, complete 2D, spectral Doppler, and color Doppler.  Birthdate:  Patient birthdate: Mar 07, 1941.  Age:  Patient is 76 yr old.  Sex:  Gender: female.    BMI: 21.3 kg/m^2.  Blood pressure:     106/72  Patient status:  Outpatient.  Study date: Study date: 02/02/2017. Study time: 10:00 AM.  -------------------------------------------------------------------  ------------------------------------------------------------------- Left ventricle:  The cavity size was normal. Systolic function was vigorous. The estimated ejection fraction was in the range of 65% to 70%. Wall motion was normal; there were no regional wall motion abnormalities. Mild concentric and severe focal basal  septal hypertrophy.  ------------------------------------------------------------------- Aortic valve:   Mildly to moderately calcified annulus. Severely calcified leaflets.  Doppler:   There was severe stenosis.   There was mild regurgitation.    VTI ratio of LVOT to aortic valve: 0.27. Valve area (VTI): 0.69 cm^2. Indexed valve area (VTI): 0.44 cm^2/m^2. Peak velocity ratio of LVOT to aortic valve: 0.3. Valve area (Vmax): 0.77 cm^2. Indexed valve area (Vmax): 0.49 cm^2/m^2. Mean velocity ratio of LVOT to aortic valve: 0.29. Valve area (Vmean): 0.74 cm^2. Indexed valve area (Vmean): 0.47 cm^2/m^2. Mean gradient (S): 43 mm Hg. Peak gradient (S): 66 mm Hg.  ------------------------------------------------------------------- Aorta:  Aortic root: The aortic root was normal in size. Ascending aorta: The ascending aorta was normal in size.  ------------------------------------------------------------------- Mitral valve:   Calcified annulus. Mildly calcified leaflets . Doppler:  There was mild regurgitation.    Peak gradient (D): 2 mm Hg.  ------------------------------------------------------------------- Left atrium:  The atrium was normal in size.  ------------------------------------------------------------------- Atrial septum:  No defect or patent foramen ovale was identified.   ------------------------------------------------------------------- Right ventricle:  The cavity size was normal. Wall thickness was normal. Systolic function was mildly reduced.  ------------------------------------------------------------------- Pulmonic valve:   Poorly visualized.  Doppler:  There was trivial regurgitation.  ------------------------------------------------------------------- Tricuspid valve:   Mildly thickened leaflets.  Doppler:  There was mild regurgitation.  ------------------------------------------------------------------- Right atrium:  The atrium was normal in  size.  ------------------------------------------------------------------- Pericardium:  There was no pericardial effusion.  ------------------------------------------------------------------- Systemic veins: Inferior vena cava: The vessel was normal in size. The respirophasic diameter changes were in the normal range (>= 50%), consistent with normal central venous pressure.  ------------------------------------------------------------------- Measurements   Left ventricle                           Value          Reference  LV ID, ED, PLAX chordal           (L)    25.4  mm       43 - 52  LV ID, ES, PLAX chordal                  24.9  mm       23 - 38  LV fx shortening, PLAX chordal    (L)  2     %        >=29  LV PW thickness, ED                      12.5  mm       ----------  IVS/LV PW ratio, ED               (H)    1.68           <=1.3  Stroke volume, 2D                        67    ml       ----------  Stroke volume/bsa, 2D                    43    ml/m^2   ----------  LV ejection fraction, 1-p A4C            84    %        ----------  LV end-diastolic volume, 2-p             43    ml       ----------  LV end-systolic volume, 2-p              9     ml       ----------  LV ejection fraction, 2-p                79    %        ----------  Stroke volume, 2-p                       34    ml       ----------  LV end-diastolic volume/bsa, 2-p         28    ml/m^2   ----------  LV end-systolic volume/bsa, 2-p          6     ml/m^2   ----------  Stroke volume/bsa, 2-p                   21.8  ml/m^2   ----------  LV e&', lateral                           3.67  cm/s     ----------  LV E/e&', lateral                         20.27          ----------  LV e&', medial                            4.45  cm/s     ----------  LV E/e&', medial                          16.72          ----------  LV e&', average                           4.06  cm/s     ----------  LV E/e&', average  18.33          ----------  Longitudinal strain, TDI                 30    %        ----------    Ventricular septum                       Value          Reference  IVS thickness, ED                        21    mm       ----------    LVOT                                     Value          Reference  LVOT ID, S                               18    mm       ----------  LVOT area                                2.54  cm^2     ----------  LVOT peak velocity, S                    123   cm/s     ----------  LVOT mean velocity, S                    90.4  cm/s     ----------  LVOT VTI, S                              26.3  cm       ----------  LVOT peak gradient, S                    6     mm Hg    ----------    Aortic valve                             Value          Reference  Aortic valve peak velocity, S            405   cm/s     ----------  Aortic valve mean velocity, S            311   cm/s     ----------  Aortic valve VTI, S                      97.2  cm       ----------  Aortic mean gradient, S                  43    mm Hg    ----------  Aortic peak gradient, S                  66    mm Hg    ----------  VTI ratio, LVOT/AV                       0.27           ----------  Aortic valve area, VTI                   0.69  cm^2     ----------  Aortic valve area/bsa, VTI               0.44  cm^2/m^2 ----------  Velocity ratio, peak, LVOT/AV            0.3            ----------  Aortic valve area, peak velocity         0.77  cm^2     ----------  Aortic valve area/bsa, peak              0.49  cm^2/m^2 ----------  velocity  Velocity ratio, mean, LVOT/AV            0.29           ----------  Aortic valve area, mean velocity         0.74  cm^2     ----------  Aortic valve area/bsa, mean              0.47  cm^2/m^2 ----------  velocity  Aortic regurg pressure half-time         441   ms       ----------    Aorta                                    Value          Reference  Aortic root ID, ED                        31    mm       ----------  Ascending aorta ID, A-P, S               34    mm       ----------    Left atrium                              Value          Reference  LA ID, A-P, ES                           39    mm       ----------  LA ID/bsa, A-P                    (H)    2.51  cm/m^2   <=2.2  LA volume, S                             28.8  ml       ----------  LA volume/bsa, S                         18.5  ml/m^2   ----------  LA volume, ES, 1-p A4C  30.2  ml       ----------  LA volume/bsa, ES, 1-p A4C               19.4  ml/m^2   ----------  LA volume, ES, 1-p A2C                   27.2  ml       ----------  LA volume/bsa, ES, 1-p A2C               17.5  ml/m^2   ----------    Mitral valve                             Value          Reference  Mitral E-wave peak velocity              74.4  cm/s     ----------  Mitral A-wave peak velocity              133   cm/s     ----------  Mitral deceleration time          (H)    479   ms       150 - 230  Mitral peak gradient, D                  2     mm Hg    ----------  Mitral E/A ratio, peak                   0.6            ----------    Tricuspid valve                          Value          Reference  Tricuspid peak RV-RA gradient            23    mm Hg    ----------    Right atrium                             Value          Reference  RA ID, S-I, ES, A4C                      34    mm       34 - 49  RA area, ES, A4C                         8.77  cm^2     8.3 - 19.5  RA volume, ES, A/L                       17.9  ml       ----------  RA volume/bsa, ES, A/L                   11.5  ml/m^2   ----------    Right ventricle                          Value          Reference  TAPSE  16.7  mm       ----------  RV s&', lateral, S                        8.8   cm/s     ----------  Legend: (L)  and  (H)  mark values outside specified reference  range.  ------------------------------------------------------------------- Prepared and Electronically Authenticated by  Prentice Docker, MD 2018-04-18T17:08:03   Right/Left Heart Cath and Coronary Angiography  Conclusion     Ost RCA to Prox RCA lesion, 20 %stenosed.  There is severe aortic valve stenosis.  LV end diastolic pressure is normal.   1. Mild non-obstructive CAD 2. Normal filling pressures 3. Severe aortic valve stenosis (mean gradient 37.5 mmHg, peak to peak gradient 47 mmHg, AVA 0.70)  Recommendations: Continue workup for TAVR   Indications   Severe aortic stenosis [I35.0 (ICD-10-CM)]  Procedural Details/Technique   Technical Details Indication: 76 yo female with severe aortic stenosis, workup for TAVR  Procedure: The risks, benefits, complications, treatment options, and expected outcomes were discussed with the patient. The patient and/or family concurred with the proposed plan, giving informed consent. The patient was brought to the cath lab after IV hydration was begun and oral premedication was given. The patient was further sedated with Versed and Fentanyl. There was an IV catheter present in the right antecubital vein. I changed this out for a 5 [place] sheath. I then performed a right heart cath with a balloon tipped catheter. The right wrist was prepped and draped in a sterile fashion. 1% lidocaine was used for local anesthesia. Using the modified Seldinger access technique, a 5 French sheath was placed in the right radial artery. 3 mg Verapamil was given through the sheath. 3000 units IV heparin was given. Standard diagnostic catheters were used to perform selective coronary angiography. I crossed the aortic valve with an AL-1 catheter and a straight wire. LV pressures measured. No LV gram. The sheath was removed from the right radial artery and a Terumo hemostasis band was applied at the arteriotomy site on the right wrist.     Estimated blood loss  <50 mL.  During this procedure the patient was administered the following to achieve and maintain moderate conscious sedation: Versed 2 mg, Fentanyl 50 mcg, while the patient's heart rate, blood pressure, and oxygen saturation were continuously monitored. The period of conscious sedation was 32 minutes, of which I was present face-to-face 100% of this time.    Complications   Complications documented before study signed (03/07/2017 6:57 AM EDT)    RIGHT/LEFT HEART CATH AND CORONARY ANGIOGRAPHY   None Documented by Kathleene Hazel, MD 03/04/2017 4:03 PM EDT  Time Range: Intra-procedure      Coronary Findings   Dominance: Right  Left Anterior Descending  Vessel is large. Vessel is angiographically normal.  First Septal Branch  Vessel is small in size.  Second Septal Branch  Vessel is small in size.  Third Septal Branch  Vessel is small in size.  Ramus Intermedius  Vessel is small.  Left Circumflex  Vessel is large. Vessel is angiographically normal.  First Obtuse Marginal Branch  Vessel is small in size.  Second Obtuse Marginal Branch  Vessel is moderate in size.  Third Obtuse Marginal Branch  Vessel is moderate in size.  Right Coronary Artery  Vessel is large.  Ost RCA to Prox RCA lesion, 20% stenosed.  Right Posterior Descending Artery  Vessel is large in size.  Right Heart  Right Heart Pressures LV EDP is normal.    Left Heart   Aortic Valve There is severe aortic valve stenosis. The aortic valve is calcified.    Coronary Diagrams   Diagnostic Diagram       Implants     No implant documentation for this case.  PACS Images   Show images for Cardiac catheterization   Link to Procedure Log   Procedure Log    Hemo Data    Most Recent Value  Fick Cardiac Output 3.97 L/min  Fick Cardiac Output Index 2.59 (L/min)/BSA  Aortic Mean Gradient 37.5 mmHg  Aortic Peak Gradient 47 mmHg  Aortic Valve Area 0.70  Aortic Value Area Index 0.46 cm2/BSA   RA A Wave 3 mmHg  RA V Wave 3 mmHg  RA Mean 3 mmHg  RV Systolic Pressure 20 mmHg  RV Diastolic Pressure -1 mmHg  RV EDP 3 mmHg  PA Systolic Pressure 22 mmHg  PA Diastolic Pressure 5 mmHg  PA Mean 12 mmHg  PW A Wave 4 mmHg  PW V Wave 6 mmHg  PW Mean 2 mmHg  AO Systolic Pressure 133 mmHg  AO Diastolic Pressure 63 mmHg  AO Mean 91 mmHg  LV Systolic Pressure 178 mmHg  LV Diastolic Pressure 3 mmHg  LV EDP 7 mmHg  Arterial Occlusion Pressure Extended Systolic Pressure 130 mmHg  Arterial Occlusion Pressure Extended Diastolic Pressure 56 mmHg  Arterial Occlusion Pressure Extended Mean Pressure 86 mmHg  Left Ventricular Apex Extended Systolic Pressure 177 mmHg  Left Ventricular Apex Extended Diastolic Pressure 1 mmHg  Left Ventricular Apex Extended EDP Pressure 10 mmHg  QP/QS 1  TPVR Index 4.63 HRUI  TSVR Index 35.08 HRUI  PVR SVR Ratio 0.11  TPVR/TSVR Ratio 0.13    Cardiac TAVR CT  TECHNIQUE: The patient was scanned on a Philips 256 scanner. A 120 kV retrospective scan was triggered in the descending thoracic aorta at 111 HU's. Gantry rotation speed was 270 msecs and collimation was .9 mm. 5 mg of iv Metoprolol and nitro were given. The 3D data set was reconstructed in 5% intervals of the R-R cycle. Systolic and diastolic phases were analyzed on a dedicated work station using MPR, MIP and VRT modes. The patient received 80 cc of contrast.  FINDINGS: Aortic Valve: Trileaflet, severely thickened and moderately calcified with severely restricted leaflet opening. There are no calcifications extending into LVOT.  Aorta: Normal size, no dissection, moderate diffuse calcifications and atherosclerotic plaque.  Sinotubular Junction:  28 x 27 mm  Ascending Thoracic Aorta:  32 x 32 mm  Aortic Arch:  23 x 22 mm  Descending Thoracic Aorta:  24 x 23 mm  Sinus of Valsalva Measurements:  Non-coronary:  29 mm  Right -coronary:  29 mm  Left -coronary:  30  mm  Coronary Artery Height above Annulus:  Left Main:  11 mm  Right Coronary:  14 mm  Virtual Basal Annulus Measurements:  Maximum/Minimum Diameter:  25 x 22 mm  Perimeter:  75 mm  Area:  438 mm2  Optimum Fluoroscopic Angle for Delivery:  LAO 10 CAU 11  There is a large multilobar left atrial appendage with no evidence for a thrombus.  IMPRESSION: 1. Trileaflet, severely thickened and moderately calcified with severely restricted leaflet opening. There are no calcifications extending into LVOT. Annular measurement suitable for a delivery of a 26 mm Edwards-SAPIEN 3 valve.  2. Sufficient annulus to coronary distance.  3. Optimum Fluoroscopic Angle for Delivery:  LAO 10 CAU 11  4.  No left atrial appendage thrombus.  Tobias Alexander   Electronically Signed   By: Tobias Alexander   On: 03/09/2017 12:37   CT ANGIOGRAPHY CHEST, ABDOMEN AND PELVIS  TECHNIQUE: Multidetector CT imaging through the chest, abdomen and pelvis was performed using the standard protocol during bolus administration of intravenous contrast. Multiplanar reconstructed images and MIPs were obtained and reviewed to evaluate the vascular anatomy.  CONTRAST:  75 mL of Isovue 370.  COMPARISON:  CT the abdomen and pelvis 11/22/2014.  FINDINGS: CTA CHEST FINDINGS  Cardiovascular: Heart size is normal with left ventricular concentric hypertrophy. There is no significant pericardial fluid, thickening or pericardial calcification. There is aortic atherosclerosis, as well as atherosclerosis of the great vessels of the mediastinum and the coronary arteries, including calcified atherosclerotic plaque in the left main, left anterior descending and right coronary arteries. Thickening calcification of the aortic valve. Thickening of the mitral valve with mild mitral annular calcifications.  Mediastinum/Lymph Nodes: No pathologically enlarged mediastinal or hilar lymph nodes.  Esophagus is unremarkable in appearance. No axillary lymphadenopathy.  Lungs/Pleura: Calcified granuloma in the left lower lobe. No larger more suspicious appearing pulmonary nodules or masses are noted. Mild diffuse bronchial wall thickening with mild to moderate centrilobular and mild paraseptal emphysema. No acute consolidative airspace disease. No pleural effusions.  Musculoskeletal/Soft Tissues: There are no aggressive appearing lytic or blastic lesions noted in the visualized portions of the skeleton. Soft tissue anchor in the left humeral head, presumably from prior rotator cuff surgery.  CTA ABDOMEN AND PELVIS FINDINGS  Hepatobiliary: No cystic or solid hepatic lesions. No intra or extrahepatic biliary ductal dilatation. 4 mm calcified gallstone lying dependently in the gallbladder. Gallbladder is otherwise unremarkable in appearance.  Pancreas: No pancreatic mass. No pancreatic ductal dilatation. No pancreatic or peripancreatic fluid or inflammatory changes.  Spleen: Unremarkable.  Adrenals/Urinary Tract: Subcentimeter low-attenuation lesion in the lower pole of left kidney. Right kidney and bilateral adrenal glands are normal in appearance. There is no hydroureteronephrosis. Urinary bladder is unremarkable in appearance.  Stomach/Bowel: The appearance of the stomach is normal. There is no pathologic dilatation of small bowel or colon. The appendix is not confidently identified and may be surgically absent. Regardless, there are no inflammatory changes noted adjacent to the cecum to suggest the presence of an acute appendicitis at this time.  Vascular/Lymphatic: Aortic atherosclerosis, without evidence of aneurysm or dissection in the abdominal or pelvic vasculature. Vascular findings and measurements pertinent to potential TAVR, as detailed below. Celicomesenteric trunk (normal anatomical variant with common origin of the celiac axis and superior mesenteric  artery from the abdominal aorta) incidentally noted. 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 Inferior mesenteric artery is patent. Bilateral renal arteries are widely patent. No lymphadenopathy noted in the abdomen or pelvis.  Reproductive: Status post hysterectomy. Ovaries are not confidently identified may be surgically absent or atrophic.  Other: No significant volume of ascites.  No pneumoperitoneum.  Musculoskeletal: There are no aggressive appearing lytic or blastic lesions noted in the visualized portions of the skeleton.  VASCULAR MEASUREMENTS PERTINENT TO TAVR:  AORTA:  Minimal Aortic Diameter -  11 x 10 mm  Severity of Aortic Calcification -  severe  RIGHT PELVIS:  Right Common Iliac  Artery -  Minimal Diameter - 6.0 x 5.5 mm  Tortuosity - mild  Calcification - moderate  Right External Iliac Artery -  Minimal Diameter - 4.3 x 4.4 mm  Tortuosity - mild  Calcification - mild  Right Common Femoral Artery -  Minimal Diameter - 5.7 x 3.9 mm  Tortuosity - mild  Calcification - moderate  LEFT PELVIS:  Left Common Iliac Artery -  Minimal Diameter - 6.3 x 5.5 mm  Tortuosity - mild  Calcification - moderate  Left External Iliac Artery -  Minimal Diameter - 4.8 x 5.1 mm  Tortuosity - mild  Calcification - minimal  Left Common Femoral Artery -  Minimal Diameter - 4.5 x 3.5 mm  Tortuosity - mild  Calcification - moderate  Review of the MIP images confirms the above findings.  IMPRESSION: 1. Vascular findings and measurements pertinent to potential TAVR procedure, as detailed above. This patient does not appear to have suitable pelvic  arterial access on either side. 2. Severe thickening calcification of the aortic valve, compatible with the reported clinical history of severe aortic stenosis. There is also what appears to be associated concentric left ventricular hypertrophy. 3. Aortic atherosclerosis, in addition to left main and 2 vessel coronary artery disease. Assessment for potential risk factor modification, dietary therapy or pharmacologic therapy may be warranted, if clinically indicated. 4. Celicomesenteric trunk (normal anatomical variant with common origin of the celiac axis and superior mesenteric artery from the abdominal aorta) incidentally noted. 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 5. Mild diffuse bronchial wall thickening with mild to moderate centrilobular and mild paraseptal emphysema; imaging findings suggestive of underlying COPD. 6. Additional incidental findings, as above.   Electronically Signed   By: Trudie Reedaniel  Entrikin M.D.   On: 03/09/2017 13:08   Pulmonary Function Tests   FVC  1.92 L  (74% predicted)  FEV1  1.13 L  (58% predicted)  FEF25-75 0.53 L  (36% predicted)  TLC  5.04 L  (102% predicted) RV  3.14 L  (137% predicted) DLCO  51% predicted   STS Risk Calculator  Procedure    AVR  Risk of Mortality   4.1% Morbidity or Mortality  19.6% Prolonged LOS   10.1% Short LOS    28.4% Permanent Stroke   1.4% Prolonged Vent Support  14.8% DSW Infection    0.2% Renal Failure    3.1% Reoperation    9.0%    Impression:  Patient has stage D severe symptomatic aortic stenosis. She presents with progressive symptoms of exertional shortness of breath and fatigue consistent with chronic diastolic congestive heart failure, [place] Heart Association function class IIb. The patient has also been experiencing loss of appetite, weight loss, increased  lower extremity edema, and intermittent dizzy spells. I have personally reviewed the patient's recent transthoracic echocardiogram, diagnostic cardiac catheterization, and CT angiograms, and pulmonary function tests.  Echocardiogram reveals severe calcification, restricted leaflet mobility, and fibrosis involving all 3 leaflets of the patient's aortic valve. Peak velocity across the aortic valve measured greater than 4 m/s corresponding to mean transvalvular gradient estimated 43 mmHg. Left ventricular systolic function remains normal. The patient does have significant left ventricular hypertrophy with a prominent septal bulge.  Diagnostic cardiac catheterization confirmed the presence of severe aortic stenosis and was notable for the absence of significant coronary artery disease.  I agree the patient needs aortic  valve replacement. Risks associated with conventional surgery would be at least moderately elevated because of the patient's age and comorbid medical problems, most notably her moderate to severe COPD. PFTs confirmed the presence of moderate to severe airway obstruction with significant hyperinflation. CT scan of the chest confirms the presence of significant parenchymal lung disease related to COPD. Under the circumstances I agree that transcatheter aortic valve replacement would likely be less risky and much less invasive.  Cardiac gated CT angiogram of the heart reveals findings consistent with severe aortic stenosis suitable for transcatheter aortic valve replacement without any significant complicating features. CT angiogram of the aorta and iliac vessels demonstrates mild to moderate atherosclerotic disease.  There are no areas of significant luminal obstruction and the patient may have adequate pelvic vascular access to facilitate a transfemoral approach although her vessels are relatively small in caliber.   Plan:  The patient and her daughter were counseled at length regarding treatment  alternatives for management of severe symptomatic aortic stenosis. Alternative approaches such as conventional aortic valve replacement, transcatheter aortic valve replacement, and palliative medical therapy were compared and contrasted at length.  The risks associated with conventional surgical aortic valve replacement were been discussed in detail, as were expectations for post-operative convalescence, and why I would be reluctant to consider this patient a candidate for conventional surgery.  Issues specific to transcatheter aortic valve replacement were discussed including questions about long term valve durability, the potential for paravalvular leak, possible increased risk of need for permanent pacemaker placement, and other technical complications related to the procedure itself.  Long-term prognosis with medical therapy was discussed. This discussion was placed in the context of the patient's own specific clinical presentation and past medical history.  All of their questions been addressed.  The patient is interested in proceeding with transcatheter aortic valve replacement in the near future.  Following the decision to proceed with transcatheter aortic valve replacement, a discussion has been held regarding what types of management strategies would be attempted intraoperatively in the event of life-threatening complications, including whether or not the patient would be considered a candidate for the use of cardiopulmonary bypass and/or conversion to open sternotomy for attempted surgical intervention.  The patient has been advised of a variety of complications that might develop including but not limited to risks of death, stroke, paravalvular leak, aortic dissection or other major vascular complications, aortic annulus rupture, device embolization, cardiac rupture or perforation, mitral regurgitation, acute myocardial infarction, arrhythmia, heart block or bradycardia requiring permanent pacemaker  placement, congestive heart failure, respiratory failure, renal failure, pneumonia, infection, other late complications related to structural valve deterioration or migration, or other complications that might ultimately cause a temporary or permanent loss of functional independence or other long term morbidity.  The patient provides full informed consent for the procedure as described and all questions were answered.  We tentatively plan to proceed with transcatheter aortic valve replacement on 04/05/2017. Her preoperative diagnostic tests will be reviewed by a multidisciplinary team of specialists. We will specifically review whether or not we feel that she may need alternative vascular access, but I suspect that transfemoral approach may be feasible. Between now and the time of surgery the patient has been encouraged to avoid any kind of strenuous activity. All of her questions have been answered.    I spent in excess of 90 minutes during the conduct of this office consultation and >50% of this time involved direct face-to-face encounter with the patient for counseling and/or coordination of  their care.    Salvatore Decent. Cornelius Moras, MD 03/10/2017 4:11 PM

## 2017-03-10 NOTE — Patient Instructions (Addendum)
Continue all previous medications without any changes at this time  Stop smoking immediately and permanently.  

## 2017-03-11 ENCOUNTER — Other Ambulatory Visit: Payer: Self-pay | Admitting: *Deleted

## 2017-03-11 DIAGNOSIS — I350 Nonrheumatic aortic (valve) stenosis: Secondary | ICD-10-CM

## 2017-03-31 ENCOUNTER — Encounter (HOSPITAL_COMMUNITY): Payer: Self-pay

## 2017-03-31 ENCOUNTER — Institutional Professional Consult (permissible substitution) (INDEPENDENT_AMBULATORY_CARE_PROVIDER_SITE_OTHER): Payer: Medicare Other | Admitting: Surgery

## 2017-03-31 ENCOUNTER — Encounter (HOSPITAL_COMMUNITY)
Admission: RE | Admit: 2017-03-31 | Discharge: 2017-03-31 | Disposition: A | Discharge status: Home / Self Care | Payer: Medicare Other | Source: Ambulatory Visit | Attending: Cardiovascular Disease | Admitting: Cardiovascular Disease

## 2017-03-31 ENCOUNTER — Ambulatory Visit (HOSPITAL_COMMUNITY)
Admission: RE | Admit: 2017-03-31 | Discharge: 2017-03-31 | Disposition: A | Discharge status: Home / Self Care | Payer: Medicare Other | Source: Ambulatory Visit | Attending: Cardiovascular Disease | Admitting: Cardiovascular Disease

## 2017-03-31 ENCOUNTER — Encounter: Payer: Self-pay | Admitting: Surgery

## 2017-03-31 VITALS — BP 141/81 | HR 86 | Resp 16 | Ht 63.0 in | Wt 112.0 lb

## 2017-03-31 DIAGNOSIS — J449 Chronic obstructive pulmonary disease, unspecified: Secondary | ICD-10-CM | POA: Insufficient documentation

## 2017-03-31 DIAGNOSIS — Z01818 Encounter for other preprocedural examination: Secondary | ICD-10-CM | POA: Diagnosis present

## 2017-03-31 DIAGNOSIS — Z01810 Encounter for preprocedural cardiovascular examination: Secondary | ICD-10-CM | POA: Insufficient documentation

## 2017-03-31 DIAGNOSIS — I350 Nonrheumatic aortic (valve) stenosis: Secondary | ICD-10-CM

## 2017-03-31 DIAGNOSIS — I700 Atherosclerosis of aorta: Secondary | ICD-10-CM | POA: Diagnosis not present

## 2017-03-31 DIAGNOSIS — R9431 Abnormal electrocardiogram [ECG] [EKG]: Secondary | ICD-10-CM | POA: Insufficient documentation

## 2017-03-31 HISTORY — DX: Diarrhea, unspecified: R19.7

## 2017-03-31 HISTORY — DX: Personal history of other medical treatment: Z92.89

## 2017-03-31 HISTORY — DX: Anxiety disorder, unspecified: F41.9

## 2017-03-31 LAB — COMPREHENSIVE METABOLIC PANEL
ALBUMIN: 4 g/dL (ref 3.5–5.0)
ALK PHOS: 56 U/L (ref 38–126)
ALT: 14 U/L (ref 14–54)
AST: 21 U/L (ref 15–41)
Anion gap: 8 (ref 5–15)
BILIRUBIN TOTAL: 0.7 mg/dL (ref 0.3–1.2)
BUN: 8 mg/dL (ref 6–20)
CALCIUM: 9.5 mg/dL (ref 8.9–10.3)
CO2: 24 mmol/L (ref 22–32)
CREATININE: 0.62 mg/dL (ref 0.44–1.00)
Chloride: 103 mmol/L (ref 101–111)
GFR calc Af Amer: >60 mL/min (ref >60)
GFR calc non Af Amer: >60 mL/min (ref >60)
GLUCOSE: 92 mg/dL (ref 65–99)
POTASSIUM: 3.9 mmol/L (ref 3.5–5.1)
Sodium: 135 mmol/L (ref 135–145)
TOTAL PROTEIN: 6.6 g/dL (ref 6.5–8.1)

## 2017-03-31 LAB — URINALYSIS, ROUTINE W REFLEX MICROSCOPIC
BILIRUBIN URINE: NEGATIVE
GLUCOSE, UA: NEGATIVE mg/dL
HGB URINE DIPSTICK: NEGATIVE
KETONES UR: NEGATIVE mg/dL
Leukocytes, UA: NEGATIVE
Nitrite: NEGATIVE
PH: 5 (ref 5.0–8.0)
Protein, ur: NEGATIVE mg/dL
SPECIFIC GRAVITY, URINE: 1.016 (ref 1.005–1.030)

## 2017-03-31 LAB — BLOOD GAS, ARTERIAL
Acid-Base Excess: 1.6 mmol/L (ref 0.0–2.0)
BICARBONATE: 25.6 mmol/L (ref 20.0–28.0)
DRAWN BY: 421801
FIO2: 21
O2 Saturation: 97.8 %
PH ART: 7.424 (ref 7.350–7.450)
PO2 ART: 102 mmHg (ref 83.0–108.0)
Patient temperature: 98.6
pCO2 arterial: 39.7 mmHg (ref 32.0–48.0)

## 2017-03-31 LAB — SURGICAL PCR SCREEN
MRSA, PCR: NEGATIVE
STAPHYLOCOCCUS AUREUS: NEGATIVE

## 2017-03-31 LAB — TYPE AND SCREEN
ABO/RH(D): O POS
Antibody Screen: NEGATIVE

## 2017-03-31 LAB — CBC
HEMATOCRIT: 40.3 % (ref 36.0–46.0)
HEMOGLOBIN: 13.1 g/dL (ref 12.0–15.0)
MCH: 29.6 pg (ref 26.0–34.0)
MCHC: 32.5 g/dL (ref 30.0–36.0)
MCV: 91 fL (ref 78.0–100.0)
Platelets: 242 10*3/uL (ref 150–400)
RBC: 4.43 MIL/uL (ref 3.87–5.11)
RDW: 13.2 % (ref 11.5–15.5)
WBC: 7.3 10*3/uL (ref 4.0–10.5)

## 2017-03-31 LAB — APTT: aPTT: 32 seconds (ref 24–36)

## 2017-03-31 LAB — PROTIME-INR
INR: 0.88
PROTHROMBIN TIME: 11.9 s (ref 11.4–15.2)

## 2017-03-31 NOTE — Progress Notes (Signed)
[facility]  MULTIDISCIPLINARY HEART VALVE CLINIC  CARDIOTHORACIC SURGERY CONSULTATION REPORT  Referring Provider is Jonelle Sidle, MD PCP is [name], MD  Chief Complaint  Patient presents with  . Aortic Stenosis    2ND TAVR EVAL    HPI:  The patient is a 76 year old woman with a long smoking history and COPD, hyperlipidemia, hypothyroidism, osteoarthritis and aortic stenosis that has been followed by Dr. Diona Browner. Previous echos have shown moderate AS with normal LV function. She now presents with several months of progressive exertional shortness of breath, fatigue, ankle edema and occasional dizziness. She says that her breath has worsened even over the past two weeks. She had an echo on 02/02/2017 showing progression to severe AS with a mean gradient of 43 mm Hg with an EF of 65-70%. She was seen by Dr. Clifton James and cath on 03/04/2017 showed no significant coronary disease with a mean AV gradient of 37.5 mm hg and a peak to peak of 47 mm Hg. AVA was 0.7 cm2.   She lives in [place] and is widowed. Her husband died with severe rheumatoid arthritis and pulmonary fibrosis with end stage lung disease on home oxygen. She has two daughters and one of them will stay with her postop. She says that she is smoking about 3 cigarettes per day now.   Past Medical History:  Diagnosis Date  . Anxiety    has had panic attacks, none reccently  . Aortic stenosis   . Colon polyp   . Complication of anesthesia    hard time waking up  . COPD (chronic obstructive pulmonary disease) (HCC)   . Diarrhea    since 2016- colon surgery  . Dyspnea    worse May 2018  . Essential hypertension, benign   . GERD (gastroesophageal reflux disease)    in 2016 not currently  . Heart murmur   . History of blood transfusion   . History of pneumonia   . Hypothyroidism   . 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 Mixed hyperlipidemia   . OA  (osteoarthritis of the spine)   . Osteoporosis   . Sigmoid diverticulitis     Past Surgical History:  Procedure Laterality Date  . ABDOMINAL HYSTERECTOMY  1972  . APPENDECTOMY    . COLONOSCOPY  10/15/05  . COLOSTOMY N/A 11/27/2014   Procedure: COLOSTOMY;  Surgeon: Harriette Bouillon, MD;  Location: [facility] OR;  Service: General;  Laterality: N/A;  . COLOSTOMY TAKEDOWN N/A 04/09/2015   Procedure: LAPAROSCOPIC ASSISTED COLOSTOMY CLOSURE; RIGID SIGMOIDOSCOPY;  Surgeon: Harriette Bouillon, MD;  Location: [facility] OR;  Service: General;  Laterality: N/A;  . ESOPHAGOGASTRODUODENOSCOPY  10/15/05  . hammer toes Bilateral    6  . LAPAROSCOPIC SIGMOID COLECTOMY N/A 11/27/2014   Procedure: LAPAROSCOPIC ASSISTED SIGMOID COLECTOMY;  Surgeon: Harriette Bouillon, MD;  Location: [facility] OR;  Service: General;  Laterality: N/A;  . LUMBAR DISC SURGERY     x 2  . RIGHT/LEFT HEART CATH AND CORONARY ANGIOGRAPHY N/A 03/04/2017   Procedure: Right/Left Heart Cath and Coronary Angiography;  Surgeon: Kathleene Hazel, MD;  Location: [facility] INVASIVE CV LAB;  Service: Cardiovascular;  Laterality: N/A;  . ROTATOR CUFF REPAIR Left 2009    Family History  Problem Relation Age of Onset  . Congestive Heart Failure Mother   . COPD Father   . Emphysema Father   . CAD Brother   . Breast cancer Sister   .  Heart disease Sister   . Uterine cancer Daughter     Social History   Social History  . Marital status: Widowed    Spouse name: N/A  . Number of children: N/A  . Years of education: N/A   Occupational History  . retired-worked at [employer]    Social History Main Topics  . Smoking status: Current Some Day Smoker    Packs/day: 0.25    Years: 40.00    Types: Cigarettes    Start date: 08/04/1956    Last attempt to quit: 11/20/2014  . Smokeless tobacco: Never Used     Comment: 2 -3  . Alcohol use No  . Drug use: No  . Sexual activity: No   Other Topics Concern  . Not on file   Social History Narrative  . No  narrative on file    Current Outpatient Prescriptions  Medication Sig Dispense Refill  . aspirin 81 MG tablet Take 81 mg by mouth every other day.     Marland Kitchen. atorvastatin (LIPITOR) 10 MG tablet Take 10 mg by mouth at bedtime.     . Calcium Carbonate-Vitamin D (CALCIUM 500 + D PO) Take 1 tablet by mouth every other day.    . levothyroxine (SYNTHROID, LEVOTHROID) 88 MCG tablet Take 88 mcg by mouth daily before breakfast.    . lisinopril (PRINIVIL,ZESTRIL) 5 MG tablet Take 5 mg by mouth daily.    . Multiple Vitamin (MULTIVITAMIN WITH MINERALS) TABS tablet Take 1 tablet by mouth every other day.    . pantoprazole (PROTONIX) 40 MG tablet Take 40 mg by mouth daily.     No current facility-administered medications for this visit.     Allergies  Allergen Reactions  . Bactrim [Sulfamethoxazole-Trimethoprim] Hives, Itching and Other (See Comments)    Irregular heart beat  . Codeine Palpitations    Dizziness, sweats       Review of Systems:   General:  decreased appetite, decreased energy, no weight gain, some weight loss, no fever  Cardiac:  no chest pain with exertion, no chest pain at rest, has SOB with mild exertion, some resting SOB, no PND, no orthopnea, has palpitations, no arrhythmia, no atrial fibrillation, some LE edema, occasional dizzy spells, no syncope  Respiratory:  exertional shortness of breath, no home oxygen, no productive cough, has dry cough, no bronchitis, has wheezing, no hemoptysis, no asthma, no pain with inspiration or cough, no sleep apnea, no CPAP at night  GI:   no difficulty swallowing, no reflux, no frequent heartburn, no hiatal hernia, no abdominal pain, no constipation, hqw diarrhea, no hematochezia, no hematemesis, no melena  GU:   no dysuria,  no frequency, no urinary tract infection, no hematuria, no enlarged prostate, no kidney stones, no kidney disease  Vascular:  no pain suggestive of claudication, no pain in feet, has leg cramps, no varicose veins, no DVT, no  non-healing foot ulcer  Neuro:   no stroke, no TIA's, no seizures, no headaches, no temporary blindness one eye,  no slurred speech, no peripheral neuropathy, no chronic pain, no instability of gait, no memory/cognitive dysfunction  Musculoskeletal: has arthritis, has joint swelling, no myalgias, no difficulty walking, normal mobility   Skin:   no rash, no itching, no skin infections, no pressure sores or ulcerations  Psych:   no anxiety, no depression, has nervousness, no unusual recent stress  Eyes:   no blurry vision, no floaters, has recent vision changes, no wears glasses ENT:   has hearing loss,  no loose or painful teeth, full dentures  Hematologic:  has easy bruising, no abnormal bleeding, no clotting disorder, no frequent epistaxis  Endocrine:  no diabetes, does not check CBG's at home      Physical Exam:   BP (!) 141/81 (BP Location: Left Arm, Patient Position: Sitting, Cuff Size: Normal)   Pulse 86   Resp 16   Ht 5\' 3"  (1.6 m)   Wt 112 lb (50.8 kg)   SpO2 94% Comment: ON RA  BMI 19.84 kg/m   General:  Elderly but  well-appearing  HEENT:  Unremarkable, NCAT, PERLA, EOMI, oropharynx clear  Neck:   no JVD, no bruits, no adenopathy or thyromegaly  Chest:   clear to auscultation, symmetrical breath sounds, no wheezes, no rhonchi   CV:   RRR, grade III/VI crescendo/decrescendo murmur heard best at RSB,  no diastolic murmur  Abdomen:  soft, non-tender, no masses or organomegaly  Extremities:  warm, well-perfused, pulses palpable, trace bilateral ankle edema  Rectal/GU  Deferred  Neuro:   Grossly non-focal and symmetrical throughout  Skin:   Clean and dry, no rashes, no breakdown   Diagnostic Tests:                 *[facility]*                   [address]                           [place], [place] [address]                            [phone]  ------------------------------------------------------------------- Transthoracic Echocardiography  Patient:     April Hull, April Hull MR #:       [id number] Study Date: 02/02/2017 Gender:     F Age:        85 Height:     160 cm Weight:     54.4 kg BSA:        1.56 m^2 Pt. Status: Room:   Lindi Adie, M.D.  REFERRING    [name], M.D.  SONOGRAPHER  [address], RVT, RDCS  ATTENDING    Prentice Docker, MD  PERFORMING   [facility]  cc:  ------------------------------------------------------------------- LV EF: 65% -   70%  ------------------------------------------------------------------- History:   PMH:   Dyspnea. SOB seems a bit worse, per patient. She has had a difficult &quot;health year&quot; and is somewhat deconditioned as well.  Chronic obstructive pulmonary disease.  Risk factors: Lifelong nonsmoker. Hypertension.  ------------------------------------------------------------------- Study Conclusions  - Left ventricle: The cavity size was normal. Systolic function was   vigorous. The estimated ejection fraction was in the range of 65%   to 70%. Wall motion was normal; there were no regional wall   motion abnormalities. Mild concentric and severe focal basal   septal hypertrophy. - Aortic valve: Mildly to moderately calcified annulus. Severely   calcified leaflets. There was severe stenosis. There was mild   regurgitation. Peak velocity (S): 405 cm/s. Mean gradient (S): 43   mm Hg. Valve area (VTI): 0.69 cm^2. Valve area (Vmax): 0.77 cm^2.   Valve area (Vmean): 0.74 cm^2. - Mitral valve: Calcified annulus. Mildly calcified leaflets .   There was mild regurgitation. - Right ventricle: Systolic function was mildly reduced. - Tricuspid valve: Mildly thickened leaflets. There was mild   regurgitation.  -------------------------------------------------------------------  Study data:  Comparison was made to the study of 07/28/2016. Previous Echo showed moderate to severe AS with mild AI. Mean gradient was 37 mmHg. Mild LVH with basal septal  hypertrophy, LVEF 65-70%, Grade 1 DD.  Study status:  Routine.  Procedure: Transthoracic echocardiography. Image quality was adequate.  Study completion:  There were no complications.          Transthoracic echocardiography.  M-mode, complete 2D, spectral Doppler, and color Doppler.  Birthdate:  Patient birthdate: 1941/09/02.  Age:  Patient is 76 yr old.  Sex:  Gender: female.    BMI: 21.3 kg/m^2.  Blood pressure:     106/72  Patient status:  Outpatient.  Study date: Study date: 02/02/2017. Study time: 10:00 AM.  -------------------------------------------------------------------  ------------------------------------------------------------------- Left ventricle:  The cavity size was normal. Systolic function was vigorous. The estimated ejection fraction was in the range of 65% to 70%. Wall motion was normal; there were no regional wall motion abnormalities. Mild concentric and severe focal basal septal hypertrophy.  ------------------------------------------------------------------- Aortic valve:   Mildly to moderately calcified annulus. Severely calcified leaflets.  Doppler:   There was severe stenosis.   There was mild regurgitation.    VTI ratio of LVOT to aortic valve: 0.27. Valve area (VTI): 0.69 cm^2. Indexed valve area (VTI): 0.44 cm^2/m^2. Peak velocity ratio of LVOT to aortic valve: 0.3. Valve area (Vmax): 0.77 cm^2. Indexed valve area (Vmax): 0.49 cm^2/m^2. Mean velocity ratio of LVOT to aortic valve: 0.29. Valve area (Vmean): 0.74 cm^2. Indexed valve area (Vmean): 0.47 cm^2/m^2. Mean gradient (S): 43 mm Hg. Peak gradient (S): 66 mm Hg.  ------------------------------------------------------------------- Aorta:  Aortic root: The aortic root was normal in size. Ascending aorta: The ascending aorta was normal in size.  ------------------------------------------------------------------- Mitral valve:   Calcified annulus. Mildly calcified leaflets . Doppler:  There was  mild regurgitation.    Peak gradient (D): 2 mm Hg.  ------------------------------------------------------------------- Left atrium:  The atrium was normal in size.  ------------------------------------------------------------------- Atrial septum:  No defect or patent foramen ovale was identified.   ------------------------------------------------------------------- Right ventricle:  The cavity size was normal. Wall thickness was normal. Systolic function was mildly reduced.  ------------------------------------------------------------------- Pulmonic valve:   Poorly visualized.  Doppler:  There was trivial regurgitation.  ------------------------------------------------------------------- Tricuspid valve:   Mildly thickened leaflets.  Doppler:  There was mild regurgitation.  ------------------------------------------------------------------- Right atrium:  The atrium was normal in size.  ------------------------------------------------------------------- Pericardium:  There was no pericardial effusion.  ------------------------------------------------------------------- Systemic veins: Inferior vena cava: The vessel was normal in size. The respirophasic diameter changes were in the normal range (>= 50%), consistent with normal central venous pressure.  ------------------------------------------------------------------- Measurements   Left ventricle                           Value          Reference  LV ID, ED, PLAX chordal           (L)    25.4  mm       43 - 52  LV ID, ES, PLAX chordal                  24.9  mm       23 - 38  LV fx shortening, PLAX chordal    (L)    2     %        >=29  LV PW thickness, ED  12.5  mm       ----------  IVS/LV PW ratio, ED               (H)    1.68           <=1.3  Stroke volume, 2D                        67    ml       ----------  Stroke volume/bsa, 2D                    43    ml/m^2   ----------  LV ejection  fraction, 1-p A4C            84    %        ----------  LV end-diastolic volume, 2-p             43    ml       ----------  LV end-systolic volume, 2-p              9     ml       ----------  LV ejection fraction, 2-p                79    %        ----------  Stroke volume, 2-p                       34    ml       ----------  LV end-diastolic volume/bsa, 2-p         28    ml/m^2   ----------  LV end-systolic volume/bsa, 2-p          6     ml/m^2   ----------  Stroke volume/bsa, 2-p                   21.8  ml/m^2   ----------  LV e&', lateral                           3.67  cm/s     ----------  LV E/e&', lateral                         20.27          ----------  LV e&', medial                            4.45  cm/s     ----------  LV E/e&', medial                          16.72          ----------  LV e&', average                           4.06  cm/s     ----------  LV E/e&', average                         18.33          ----------  Longitudinal strain, TDI  30    %        ----------    Ventricular septum                       Value          Reference  IVS thickness, ED                        21    mm       ----------    LVOT                                     Value          Reference  LVOT ID, S                               18    mm       ----------  LVOT area                                2.54  cm^2     ----------  LVOT peak velocity, S                    123   cm/s     ----------  LVOT mean velocity, S                    90.4  cm/s     ----------  LVOT VTI, S                              26.3  cm       ----------  LVOT peak gradient, S                    6     mm Hg    ----------    Aortic valve                             Value          Reference  Aortic valve peak velocity, S            405   cm/s     ----------  Aortic valve mean velocity, S            311   cm/s     ----------  Aortic valve VTI, S                      97.2  cm       ----------  Aortic mean  gradient, S                  43    mm Hg    ----------  Aortic peak gradient, S                  66    mm Hg    ----------  VTI ratio, LVOT/AV                       0.27           ----------  Aortic valve area, VTI                   0.69  cm^2     ----------  Aortic valve area/bsa, VTI               0.44  cm^2/m^2 ----------  Velocity ratio, peak, LVOT/AV            0.3            ----------  Aortic valve area, peak velocity         0.77  cm^2     ----------  Aortic valve area/bsa, peak              0.49  cm^2/m^2 ----------  velocity  Velocity ratio, mean, LVOT/AV            0.29           ----------  Aortic valve area, mean velocity         0.74  cm^2     ----------  Aortic valve area/bsa, mean              0.47  cm^2/m^2 ----------  velocity  Aortic regurg pressure half-time         441   ms       ----------    Aorta                                    Value          Reference  Aortic root ID, ED                       31    mm       ----------  Ascending aorta ID, A-P, S               34    mm       ----------    Left atrium                              Value          Reference  LA ID, A-P, ES                           39    mm       ----------  LA ID/bsa, A-P                    (H)    2.51  cm/m^2   <=2.2  LA volume, S                             28.8  ml       ----------  LA volume/bsa, S                         18.5  ml/m^2   ----------  LA volume, ES, 1-p A4C                   30.2  ml       ----------  LA volume/bsa, ES, 1-p A4C               19.4  ml/m^2   ----------  LA volume, ES, 1-p A2C                   27.2  ml       ----------  LA volume/bsa, ES, 1-p A2C               17.5  ml/m^2   ----------    Mitral valve                             Value          Reference  Mitral E-wave peak velocity              74.4  cm/s     ----------  Mitral A-wave peak velocity              133   cm/s     ----------  Mitral deceleration time          (H)    479   ms       150 - 230  Mitral  peak gradient, D                  2     mm Hg    ----------  Mitral E/A ratio, peak                   0.6            ----------    Tricuspid valve                          Value          Reference  Tricuspid peak RV-RA gradient            23    mm Hg    ----------    Right atrium                             Value          Reference  RA ID, S-I, ES, A4C                      34    mm       34 - 49  RA area, ES, A4C                         8.77  cm^2     8.3 - 19.5  RA volume, ES, A/L                       17.9  ml       ----------  RA volume/bsa, ES, A/L                   11.5  ml/m^2   ----------    Right ventricle                          Value          Reference  TAPSE                                    16.7  mm       ----------  RV s&', lateral, S                        8.8   cm/s     ----------  Legend: (L)  and  (H)  mark values outside specified reference range.  ------------------------------------------------------------------- Prepared and Electronically Authenticated by  Prentice Docker, MD 2018-04-18T17:08:03   Physicians   Panel Physicians Referring Physician Case Authorizing Physician  Kathleene Hazel, MD (Primary)    Procedures   Right/Left Heart Cath and Coronary Angiography  Conclusion     Ost RCA to Prox RCA lesion, 20 %stenosed.  There is severe aortic valve stenosis.  LV end diastolic pressure is normal.   1. Mild non-obstructive CAD 2. Normal filling pressures 3. Severe aortic valve stenosis (mean gradient 37.5 mmHg, peak to peak gradient 47 mmHg, AVA 0.70)  Recommendations: Continue workup for TAVR   Indications   Severe aortic stenosis [I35.0 (ICD-10-CM)]  Procedural Details/Technique   Technical Details Indication: 76 yo female with severe aortic stenosis, workup for TAVR  Procedure: The risks, benefits, complications, treatment options, and expected outcomes were discussed with the patient. The patient and/or family  concurred with the proposed plan, giving informed consent. The patient was brought to the cath lab after IV hydration was begun and oral premedication was given. The patient was further sedated with Versed and Fentanyl. There was an IV catheter present in the right antecubital vein. I changed this out for a 5 [place] sheath. I then performed a right heart cath with a balloon tipped catheter. The right wrist was prepped and draped in a sterile fashion. 1% lidocaine was used for local anesthesia. Using the modified Seldinger access technique, a 5 French sheath was placed in the right radial artery. 3 mg Verapamil was given through the sheath. 3000 units IV heparin was given. Standard diagnostic catheters were used to perform selective coronary angiography. I crossed the aortic valve with an AL-1 catheter and a straight wire. LV pressures measured. No LV gram. The sheath was removed from the right radial artery and a Terumo hemostasis band was applied at the arteriotomy site on the right wrist.     Estimated blood loss <50 mL.  During this procedure the patient was administered the following to achieve and maintain moderate conscious sedation: Versed 2 mg, Fentanyl 50 mcg, while the patient's heart rate, blood pressure, and oxygen saturation were continuously monitored. The period of conscious sedation was 32 minutes, of which I was present face-to-face 100% of this time.    Complications   Complications documented before study signed (03/07/2017 6:57 AM EDT)    RIGHT/LEFT HEART CATH AND CORONARY ANGIOGRAPHY   None Documented by Kathleene Hazel, MD 03/04/2017 4:03 PM EDT  Time Range: Intra-procedure      Coronary Findings   Dominance: Right  Left Anterior Descending  Vessel is large. Vessel is angiographically normal.  First Septal Branch  Vessel is small in size.  Second Septal Branch  Vessel is small in size.  Third Septal Branch  Vessel is small in size.  Ramus Intermedius    Vessel is small.  Left Circumflex  Vessel is large. Vessel is angiographically normal.  First Obtuse Marginal Branch  Vessel is small in size.  Second Obtuse Marginal Branch  Vessel is moderate in size.  Third Obtuse Marginal Branch  Vessel is moderate in size.  Right Coronary Artery  Vessel is large.  Ost RCA to Prox RCA lesion, 20% stenosed.  Right  Posterior Descending Artery  Vessel is large in size.  Right Heart   Right Heart Pressures LV EDP is normal.    Left Heart   Aortic Valve There is severe aortic valve stenosis. The aortic valve is calcified.    Coronary Diagrams   Diagnostic Diagram       Implants     No implant documentation for this case.  PACS Images   Show images for Cardiac catheterization   Link to Procedure Log   Procedure Log    Hemo Data    Most Recent Value  Fick Cardiac Output 3.97 L/min  Fick Cardiac Output Index 2.59 (L/min)/BSA  Aortic Mean Gradient 37.5 mmHg  Aortic Peak Gradient 47 mmHg  Aortic Valve Area 0.70  Aortic Value Area Index 0.46 cm2/BSA  RA A Wave 3 mmHg  RA V Wave 3 mmHg  RA Mean 3 mmHg  RV Systolic Pressure 20 mmHg  RV Diastolic Pressure -1 mmHg  RV EDP 3 mmHg  PA Systolic Pressure 22 mmHg  PA Diastolic Pressure 5 mmHg  PA Mean 12 mmHg  PW A Wave 4 mmHg  PW V Wave 6 mmHg  PW Mean 2 mmHg  AO Systolic Pressure 133 mmHg  AO Diastolic Pressure 63 mmHg  AO Mean 91 mmHg  LV Systolic Pressure 178 mmHg  LV Diastolic Pressure 3 mmHg  LV EDP 7 mmHg  Arterial Occlusion Pressure Extended Systolic Pressure 130 mmHg  Arterial Occlusion Pressure Extended Diastolic Pressure 56 mmHg  Arterial Occlusion Pressure Extended Mean Pressure 86 mmHg  Left Ventricular Apex Extended Systolic Pressure 177 mmHg  Left Ventricular Apex Extended Diastolic Pressure 1 mmHg  Left Ventricular Apex Extended EDP Pressure 10 mmHg  QP/QS 1  TPVR Index 4.63 HRUI  TSVR Index 35.08 HRUI  PVR SVR Ratio 0.11  TPVR/TSVR Ratio 0.13     ADDENDUM REPORT: 03/09/2017 12:37  CLINICAL DATA:  76 year old female with severe aortic stenosis.  EXAM: Cardiac TAVR CT  TECHNIQUE: The patient was scanned on a Philips 256 scanner. A 120 kV retrospective scan was triggered in the descending thoracic aorta at 111 HU's. Gantry rotation speed was 270 msecs and collimation was .9 mm. 5 mg of iv Metoprolol and nitro were given. The 3D data set was reconstructed in 5% intervals of the R-R cycle. Systolic and diastolic phases were analyzed on a dedicated work station using MPR, MIP and VRT modes. The patient received 80 cc of contrast.  FINDINGS: Aortic Valve: Trileaflet, severely thickened and moderately calcified with severely restricted leaflet opening. There are no calcifications extending into LVOT.  Aorta: Normal size, no dissection, moderate diffuse calcifications and atherosclerotic plaque.  Sinotubular Junction:  28 x 27 mm  Ascending Thoracic Aorta:  32 x 32 mm  Aortic Arch:  23 x 22 mm  Descending Thoracic Aorta:  24 x 23 mm  Sinus of Valsalva Measurements:  Non-coronary:  29 mm  Right -coronary:  29 mm  Left -coronary:  30 mm  Coronary Artery Height above Annulus:  Left Main:  11 mm  Right Coronary:  14 mm  Virtual Basal Annulus Measurements:  Maximum/Minimum Diameter:  25 x 22 mm  Perimeter:  75 mm  Area:  438 mm2  Optimum Fluoroscopic Angle for Delivery:  LAO 10 CAU 11  There is a large multilobar left atrial appendage with no evidence for a thrombus.  IMPRESSION: 1. Trileaflet, severely thickened and moderately calcified with severely restricted leaflet opening. There are no calcifications extending into LVOT. Annular measurement suitable  for a delivery of a 26 mm Edwards-SAPIEN 3 valve.  2. Sufficient annulus to coronary distance.  3. Optimum Fluoroscopic Angle for Delivery:  LAO 10 CAU 11  4.  No left atrial appendage thrombus.  Tobias Alexander   Electronically Signed   By: Tobias Alexander   On: 03/09/2017 12:37   Addended by Lars Masson, MD on 03/09/2017 12:40 PM    Study Result   EXAM: OVER-READ INTERPRETATION  CT CHEST  The following report is an over-read performed by radiologist Dr. Royal [facility] on 03/09/2017. This over-read does not include interpretation of cardiac or coronary anatomy or pathology. The coronary calcium score/coronary CTA interpretation by the cardiologist is attached.  COMPARISON:  None.  FINDINGS: Extracardiac findings will be discussed separately under dictation for contemporaneously obtained CTA of the chest, abdomen and pelvis.  IMPRESSION: Please see separate dictation for contemporaneously obtained CTA of the chest, abdomen and pelvis 03/09/2017 for full description of extracardiac findings.  Electronically Signed: By: Trudie Reed M.D. On: 03/09/2017 12:10       CLINICAL DATA:  76 year old female with history of severe aortic stenosis. Preprocedural study prior to potential transcatheter aortic valve replacement (TAVR) procedure.  EXAM: CT ANGIOGRAPHY CHEST, ABDOMEN AND PELVIS  TECHNIQUE: Multidetector CT imaging through the chest, abdomen and pelvis was performed using the standard protocol during bolus administration of intravenous contrast. Multiplanar reconstructed images and MIPs were obtained and reviewed to evaluate the vascular anatomy.  CONTRAST:  75 mL of Isovue 370.  COMPARISON:  CT the abdomen and pelvis 11/22/2014.  FINDINGS: CTA CHEST FINDINGS  Cardiovascular: Heart size is normal with left ventricular concentric hypertrophy. There is no significant pericardial fluid, thickening or pericardial calcification. There is aortic atherosclerosis, as well as atherosclerosis of the great vessels of the mediastinum and the coronary arteries, including calcified atherosclerotic plaque in the left  main, left anterior descending and right coronary arteries. Thickening calcification of the aortic valve. Thickening of the mitral valve with mild mitral annular calcifications.  Mediastinum/Lymph Nodes: No pathologically enlarged mediastinal or hilar lymph nodes. Esophagus is unremarkable in appearance. No axillary lymphadenopathy.  Lungs/Pleura: Calcified granuloma in the left lower lobe. No larger more suspicious appearing pulmonary nodules or masses are noted. Mild diffuse bronchial wall thickening with mild to moderate centrilobular and mild paraseptal emphysema. No acute consolidative airspace disease. No pleural effusions.  Musculoskeletal/Soft Tissues: There are no aggressive appearing lytic or blastic lesions noted in the visualized portions of the skeleton. Soft tissue anchor in the left humeral head, presumably from prior rotator cuff surgery.  CTA ABDOMEN AND PELVIS FINDINGS  Hepatobiliary: No cystic or solid hepatic lesions. No intra or extrahepatic biliary ductal dilatation. 4 mm calcified gallstone lying dependently in the gallbladder. Gallbladder is otherwise unremarkable in appearance.  Pancreas: No pancreatic mass. No pancreatic ductal dilatation. No pancreatic or peripancreatic fluid or inflammatory changes.  Spleen: Unremarkable.  Adrenals/Urinary Tract: Subcentimeter low-attenuation lesion in the lower pole of left kidney. Right kidney and bilateral adrenal glands are normal in appearance. There is no hydroureteronephrosis. Urinary bladder is unremarkable in appearance.  Stomach/Bowel: The appearance of the stomach is normal. There is no pathologic dilatation of small bowel or colon. The appendix is not confidently identified and may be surgically absent. Regardless, there are no inflammatory changes noted adjacent to the cecum to suggest the presence of an acute appendicitis at this time.  Vascular/Lymphatic: Aortic atherosclerosis, without  evidence of aneurysm or dissection in the abdominal or pelvic  vasculature. Vascular findings and measurements pertinent to potential TAVR, as detailed below. Celicomesenteric trunk (normal anatomical variant with common origin of the celiac axis and superior mesenteric artery from the abdominal aorta) incidentally noted. 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 Inferior mesenteric artery is patent. Bilateral renal arteries are widely patent. No lymphadenopathy noted in the abdomen or pelvis.  Reproductive: Status post hysterectomy. Ovaries are not confidently identified may be surgically absent or atrophic.  Other: No significant volume of ascites.  No pneumoperitoneum.  Musculoskeletal: There are no aggressive appearing lytic or blastic lesions noted in the visualized portions of the skeleton.  VASCULAR MEASUREMENTS PERTINENT TO TAVR:  AORTA:  Minimal Aortic Diameter -  11 x 10 mm  Severity of Aortic Calcification -  severe  RIGHT PELVIS:  Right Common Iliac Artery -  Minimal Diameter - 6.0 x 5.5 mm  Tortuosity - mild  Calcification - moderate  Right External Iliac Artery -  Minimal Diameter - 4.3 x 4.4 mm  Tortuosity - mild  Calcification - mild  Right Common Femoral Artery -  Minimal Diameter - 5.7 x 3.9 mm  Tortuosity - mild  Calcification - moderate  LEFT PELVIS:  Left Common Iliac Artery -  Minimal Diameter - 6.3 x 5.5 mm  Tortuosity - mild  Calcification - moderate  Left External Iliac Artery -  Minimal Diameter - 4.8 x 5.1 mm  Tortuosity - mild  Calcification - minimal  Left Common Femoral Artery -  Minimal Diameter - 4.5 x 3.5  mm  Tortuosity - mild  Calcification - moderate  Review of the MIP images confirms the above findings.  IMPRESSION: 1. Vascular findings and measurements pertinent to potential TAVR procedure, as detailed above. This patient does not appear to have suitable pelvic arterial access on either side. 2. Severe thickening calcification of the aortic valve, compatible with the reported clinical history of severe aortic stenosis. There is also what appears to be associated concentric left ventricular hypertrophy. 3. Aortic atherosclerosis, in addition to left main and 2 vessel coronary artery disease. Assessment for potential risk factor modification, dietary therapy or pharmacologic therapy may be warranted, if clinically indicated. 4. Celicomesenteric trunk (normal anatomical variant with common origin of the celiac axis and superior mesenteric artery from the abdominal aorta) incidentally noted. 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 5. Mild diffuse bronchial wall thickening with mild to moderate centrilobular and mild paraseptal emphysema; imaging findings suggestive of underlying COPD. 6. Additional incidental findings, as above.   Electronically Signed   By: Trudie Reed M.D.   On: 03/09/2017 13:08   Impression:  This 76 year old woman has stage D, severe, symptomatic aortic stenosis with NYHA class II symptoms of exertional fatigue and shortness of breath, with occasional dizziness consistent with chronic diastolic heart failure. Her symptoms have been worsening over the past two weeks. I have personally reviewed her echo, cath and CTA studies. Her echo shows a trileaflet aortic valve with severe calcification and thickening of all three leaflets with restricted mobility and a mean gradient of 43 mm Hg. LVEF is normal. Cath confirms  severe AS and shows no significant CAD. I agree that AVR is indicated in this patient who is still very functional and would like to remain active. I agree that her risk for open surgical AVR is moderately elevated due to her age, severe COPD, and other comorbid factors. I think TAVR would be a better option for her with less operative risk and a much quicker recovery. Her gated cardiac CT shows anatomy favorable for a Sapien 3 valve. Her abdominal and pelvic CT shows relatively small pelvic arterial vessels with mild to moderate atherosclerosis but I think they are probably adequate for a transfemoral insertion with the left side being slightly larger than the right. There is no concentric calcification.   The patient was counseled at length regarding treatment alternatives for management of severe symptomatic aortic stenosis. The risks and benefits of surgical intervention has been discussed in detail. Long-term prognosis with medical therapy was discussed. Alternative approaches such as conventional surgical aortic valve replacement, transcatheter aortic valve replacement, and palliative medical therapy were compared and contrasted at length. This discussion was placed in the context of the patient's own specific clinical presentation and past medical history. All of their questions been addressed. The patient is eager to proceed with surgical management as soon as possible.   Following the decision to proceed with transcatheter aortic valve replacement, a discussion was held regarding what types of management strategies would be attempted intraoperatively in the event of life-threatening complications, including whether or not the patient would be considered a candidate for the use of cardiopulmonary bypass and/or conversion to open sternotomy for attempted surgical intervention. The patient has been advised of a variety of complications that might develop including but not limited to risks of death, stroke,  paravalvular leak, aortic dissection or other major vascular complications, aortic annulus rupture, device embolization, cardiac rupture or perforation, mitral regurgitation, acute myocardial infarction, arrhythmia, heart block or bradycardia requiring permanent pacemaker placement, congestive heart failure, respiratory failure, renal failure, pneumonia, infection, other late complications related to structural valve deterioration or migration, or other complications that might ultimately cause a temporary or permanent loss of functional independence or other long term morbidity. She also understands that if transfemoral insertion is not possible then trans-apical or trans-aortic insertion may be necessary. The patient provides full informed consent for the procedure as described and all questions were answered.     Plan:  Transfemoral TAVR on 04/05/2017, 2nd case by Dr. Cornelius Moras and Dr. Clifton James.    I spent 45 minutes performing this consultation and > 50% of this time was spent face to face counseling and coordinating the care of this patient's severe aortic stenosis.    Alleen Borne, MD 03/31/2017 4:32 PM

## 2017-03-31 NOTE — Pre-Procedure Instructions (Signed)
April DavenportShelby B Hull  03/31/2017     Your procedure is scheduled on Tuesday, June 19  [facility] at 8:00 AM                       Your surgery or procedure is scheduled for 10:25 A.M.   Call this number if you have problems the morning of [phone]                 For any other questions, please call [phone], Monday - Friday 8 AM - 4 PM.   Remember:  Do not eat food or drink liquids after midnight Monday, June 18.  Take these medicines the morning of surgery with A SIP OF WATER : levothyroxine (SYNTHROID, LEVOTHROID),  pantoprazole (PROTONIX).                   STOP taking Aspirin, Aspirin Products (Goody Powder, Excedrin Migraine), Ibuprofen (Advil), Naproxen (Aleve), Vitamuins and Herbal Products (ie Fish Oil) Special instructions:   [name]- Preparing For Surgery  Before surgery, you can play an important role. Because skin is not sterile, your skin needs to be as free of germs as possible. You can reduce the number of germs on your skin by washing with CHG (chlorahexidine gluconate) Soap before surgery.  CHG is an antiseptic cleaner which kills germs and bonds with the skin to continue killing germs even after washing.  Please do not use if you have an allergy to CHG or antibacterial soaps. If your skin becomes reddened/irritated stop using the CHG.  Do not shave (including legs and underarms) for at least 48 hours prior to first CHG shower. It is OK to shave your face.  Please follow these instructions carefully.   1. Shower the NIGHT BEFORE SURGERY and the MORNING OF SURGERY with CHG.   2. If you chose to wash your hair, wash your hair first as usual with your normal shampoo.  3. After you shampoo, rinse your hair and body thoroughly to remove the shampoo.  4. Use CHG as you would any other liquid soap. You can apply CHG directly to the skin and wash gently with a scrungie or a clean washcloth.   5. Apply the CHG Soap to your  body ONLY FROM THE NECK DOWN.  Do not use on open wounds or open sores. Avoid contact with your eyes, ears, mouth and genitals (private parts). Wash genitals (private parts) with your normal soap.  6. Wash thoroughly, paying special attention to the area where your surgery will be performed.  7. Thoroughly rinse your body with warm water from the neck down.  8. DO NOT shower/wash with your normal soap after using and rinsing off the CHG Soap.  9. Pat yourself dry with a CLEAN TOWEL.   10. Wear CLEAN PAJAMAS   11. Place CLEAN SHEETS on your bed the night of your first shower and DO NOT SLEEP WITH PETS.   Day of Surgery: Shower as Above Do not apply any deodorants/lotions powders, or perfumes, . Please wear clean clothes to the hospital/surgery center.   Do not wear jewelry, make-up or nail polish.  Do not shave 48 hours prior to surgery.  Men may shave face and neck.  Do not bring valuables to the hospital.  [facility] not [facility] for any belongings or valuables.  Contacts, dentures or bridgework may not be worn into surgery.  Leave your suitcase in  the car.  After surgery it may be brought to your room.  For patients admitted to the hospital, discharge time will be determined by your treatment team.  Please read over the following fact sheets that you were given: Pain Booklet, Patient Instructions for Mupirocin Application, Incentive Spirometry,Coughing and Deep Breathing, Surgical Site Infections.

## 2017-04-01 LAB — HEMOGLOBIN A1C
Hgb A1c MFr Bld: 5.5 % (ref 4.8–5.6)
MEAN PLASMA GLUCOSE: 111 mg/dL

## 2017-04-04 MED ORDER — SODIUM CHLORIDE 0.9 % IV SOLN
INTRAVENOUS | Status: DC
  Filled 2017-04-04: qty 1

## 2017-04-04 MED ORDER — POTASSIUM CHLORIDE 2 MEQ/ML IV SOLN
80.0000 meq | INTRAVENOUS | Status: DC
  Filled 2017-04-04: qty 40

## 2017-04-04 MED ORDER — MAGNESIUM SULFATE 50 % IJ SOLN
40.0000 meq | INTRAMUSCULAR | Status: DC
  Filled 2017-04-04: qty 10

## 2017-04-04 MED ORDER — DOPAMINE-DEXTROSE 3.2-5 MG/ML-% IV SOLN
0.0000 ug/kg/min | INTRAVENOUS | Status: DC
  Filled 2017-04-04: qty 250

## 2017-04-04 MED ORDER — HEPARIN SODIUM (PORCINE) 1000 UNIT/ML IJ SOLN
INTRAMUSCULAR | Status: DC
  Filled 2017-04-04: qty 30

## 2017-04-04 MED ORDER — DEXMEDETOMIDINE HCL IN NACL 400 MCG/100ML IV SOLN
0.1000 ug/kg/h | INTRAVENOUS | Status: AC
  Administered 2017-04-05: 1.5 ug/kg/h via INTRAVENOUS
  Filled 2017-04-04: qty 100

## 2017-04-04 MED ORDER — PHENYLEPHRINE HCL 10 MG/ML IJ SOLN
30.0000 ug/min | INTRAMUSCULAR | Status: DC
  Administered 2017-04-05: 60 ug/min via INTRAVENOUS
  Filled 2017-04-04: qty 2

## 2017-04-04 MED ORDER — DEXTROSE 5 % IV SOLN
1.5000 g | INTRAVENOUS | Status: AC
  Administered 2017-04-05: 1.5 g via INTRAVENOUS
  Filled 2017-04-04: qty 1.5

## 2017-04-04 MED ORDER — NOREPINEPHRINE BITARTRATE 1 MG/ML IV SOLN
0.0000 ug/min | INTRAVENOUS | Status: AC
  Administered 2017-04-05: 1 ug/min via INTRAVENOUS
  Filled 2017-04-04: qty 4

## 2017-04-04 MED ORDER — NITROGLYCERIN IN D5W 200-5 MCG/ML-% IV SOLN
2.0000 ug/min | INTRAVENOUS | Status: DC
  Filled 2017-04-04: qty 250

## 2017-04-04 MED ORDER — EPINEPHRINE PF 1 MG/ML IJ SOLN
0.0000 ug/min | INTRAMUSCULAR | Status: DC
  Filled 2017-04-04: qty 4

## 2017-04-04 MED ORDER — VANCOMYCIN HCL IN DEXTROSE 1-5 GM/200ML-% IV SOLN
1000.0000 mg | INTRAVENOUS | Status: AC
  Administered 2017-04-05: 1000 mg via INTRAVENOUS
  Filled 2017-04-04: qty 200

## 2017-04-04 NOTE — Anesthesia Preprocedure Evaluation (Addendum)
Anesthesia Evaluation  Patient identified by MRN, date of birth, ID band Patient awake    Reviewed: Allergy & Precautions, H&P , NPO status , Patient's Chart, lab work & pertinent test results  Airway Mallampati: II  TM Distance: >3 FB Neck ROM: Full    Dental no notable dental hx. (+) Edentulous Upper, Edentulous Lower, Dental Advisory Given   Pulmonary COPD, Current Smoker,    Pulmonary exam normal breath sounds clear to auscultation       Cardiovascular Exercise Tolerance: Good hypertension, Pt. on medications + Valvular Problems/Murmurs AS  Rhythm:Regular Rate:Normal + Systolic murmurs    Neuro/Psych Anxiety negative neurological ROS  negative psych ROS   GI/Hepatic Neg liver ROS, GERD  Medicated and Controlled,  Endo/Other  Hypothyroidism   Renal/GU negative Renal ROS  negative genitourinary   Musculoskeletal  (+) Arthritis , Osteoarthritis,    Abdominal   Peds  Hematology negative hematology ROS (+)   Anesthesia Other Findings   Reproductive/Obstetrics negative OB ROS                            Anesthesia Physical Anesthesia Plan  ASA: IV  Anesthesia Plan: MAC   Post-op Pain Management:    Induction: Intravenous  PONV Risk Score and Plan: 1 and Ondansetron, Propofol and Midazolam  Airway Management Planned: Nasal Cannula  Additional Equipment: Arterial line, CVP and Ultrasound Guidance Line Placement  Intra-op Plan:   Post-operative Plan:   Informed Consent: I have reviewed the patients History and Physical, chart, labs and discussed the procedure including the risks, benefits and alternatives for the proposed anesthesia with the patient or authorized representative who has indicated his/her understanding and acceptance.   Dental advisory given  Plan Discussed with: CRNA  Anesthesia Plan Comments:        Anesthesia Quick Evaluation

## 2017-04-05 ENCOUNTER — Inpatient Hospital Stay (HOSPITAL_COMMUNITY)
Admission: RE | Admit: 2017-04-05 | Discharge: 2017-04-07 | DRG: 267 | Disposition: A | Discharge status: Home / Self Care | Payer: Medicare Other | Source: Ambulatory Visit | Attending: Cardiovascular Disease | Admitting: Cardiovascular Disease

## 2017-04-05 ENCOUNTER — Inpatient Hospital Stay (HOSPITAL_COMMUNITY): Payer: Medicare Other

## 2017-04-05 ENCOUNTER — Other Ambulatory Visit: Payer: Self-pay | Admitting: *Deleted

## 2017-04-05 ENCOUNTER — Inpatient Hospital Stay (HOSPITAL_COMMUNITY): Payer: Medicare Other | Admitting: Anesthesiology

## 2017-04-05 ENCOUNTER — Encounter (HOSPITAL_COMMUNITY): Payer: Self-pay | Admitting: *Deleted

## 2017-04-05 ENCOUNTER — Encounter (HOSPITAL_COMMUNITY)
Admission: RE | Disposition: A | Discharge status: Home / Self Care | Payer: Self-pay | Source: Ambulatory Visit | Attending: Cardiovascular Disease

## 2017-04-05 DIAGNOSIS — I2510 Atherosclerotic heart disease of native coronary artery without angina pectoris: Secondary | ICD-10-CM | POA: Diagnosis present

## 2017-04-05 DIAGNOSIS — E785 Hyperlipidemia, unspecified: Secondary | ICD-10-CM | POA: Diagnosis present

## 2017-04-05 DIAGNOSIS — J439 Emphysema, unspecified: Secondary | ICD-10-CM | POA: Diagnosis present

## 2017-04-05 DIAGNOSIS — M479 Spondylosis, unspecified: Secondary | ICD-10-CM | POA: Diagnosis present

## 2017-04-05 DIAGNOSIS — K219 Gastro-esophageal reflux disease without esophagitis: Secondary | ICD-10-CM | POA: Diagnosis present

## 2017-04-05 DIAGNOSIS — I350 Nonrheumatic aortic (valve) stenosis: Secondary | ICD-10-CM

## 2017-04-05 DIAGNOSIS — E039 Hypothyroidism, unspecified: Secondary | ICD-10-CM | POA: Diagnosis present

## 2017-04-05 DIAGNOSIS — J449 Chronic obstructive pulmonary disease, unspecified: Secondary | ICD-10-CM | POA: Diagnosis present

## 2017-04-05 DIAGNOSIS — Z8619 Personal history of other infectious and parasitic diseases: Secondary | ICD-10-CM

## 2017-04-05 DIAGNOSIS — Z881 Allergy status to other antibiotic agents status: Secondary | ICD-10-CM

## 2017-04-05 DIAGNOSIS — M810 Age-related osteoporosis without current pathological fracture: Secondary | ICD-10-CM | POA: Diagnosis present

## 2017-04-05 DIAGNOSIS — I110 Hypertensive heart disease with heart failure: Secondary | ICD-10-CM | POA: Diagnosis present

## 2017-04-05 DIAGNOSIS — Z7982 Long term (current) use of aspirin: Secondary | ICD-10-CM | POA: Diagnosis not present

## 2017-04-05 DIAGNOSIS — Z7902 Long term (current) use of antithrombotics/antiplatelets: Secondary | ICD-10-CM | POA: Diagnosis not present

## 2017-04-05 DIAGNOSIS — I5032 Chronic diastolic (congestive) heart failure: Secondary | ICD-10-CM | POA: Diagnosis present

## 2017-04-05 DIAGNOSIS — E782 Mixed hyperlipidemia: Secondary | ICD-10-CM | POA: Diagnosis present

## 2017-04-05 DIAGNOSIS — Z952 Presence of prosthetic heart valve: Secondary | ICD-10-CM | POA: Diagnosis not present

## 2017-04-05 DIAGNOSIS — Z006 Encounter for examination for normal comparison and control in clinical research program: Secondary | ICD-10-CM

## 2017-04-05 DIAGNOSIS — Z953 Presence of xenogenic heart valve: Secondary | ICD-10-CM

## 2017-04-05 DIAGNOSIS — F17210 Nicotine dependence, cigarettes, uncomplicated: Secondary | ICD-10-CM | POA: Diagnosis present

## 2017-04-05 DIAGNOSIS — Z90710 Acquired absence of both cervix and uterus: Secondary | ICD-10-CM

## 2017-04-05 DIAGNOSIS — F419 Anxiety disorder, unspecified: Secondary | ICD-10-CM | POA: Diagnosis present

## 2017-04-05 DIAGNOSIS — Z954 Presence of other heart-valve replacement: Secondary | ICD-10-CM | POA: Diagnosis not present

## 2017-04-05 DIAGNOSIS — Z885 Allergy status to narcotic agent status: Secondary | ICD-10-CM

## 2017-04-05 HISTORY — DX: Presence of prosthetic heart valve: Z95.2

## 2017-04-05 HISTORY — PX: TRANSCATHETER AORTIC VALVE REPLACEMENT, TRANSFEMORAL: SHX6400

## 2017-04-05 HISTORY — PX: TEE WITHOUT CARDIOVERSION: SHX5443

## 2017-04-05 LAB — POCT I-STAT, CHEM 8
BUN: 10 mg/dL (ref 6–20)
BUN: 10 mg/dL (ref 6–20)
BUN: 12 mg/dL (ref 6–20)
CALCIUM ION: 1.22 mmol/L (ref 1.15–1.40)
CHLORIDE: 103 mmol/L (ref 101–111)
CREATININE: 0.5 mg/dL (ref 0.44–1.00)
Calcium, Ion: 1.23 mmol/L (ref 1.15–1.40)
Calcium, Ion: 1.25 mmol/L (ref 1.15–1.40)
Chloride: 101 mmol/L (ref 101–111)
Chloride: 101 mmol/L (ref 101–111)
Creatinine, Ser: 0.4 mg/dL — ABNORMAL LOW (ref 0.44–1.00)
Creatinine, Ser: 0.4 mg/dL — ABNORMAL LOW (ref 0.44–1.00)
Glucose, Bld: 105 mg/dL — ABNORMAL HIGH (ref 65–99)
Glucose, Bld: 117 mg/dL — ABNORMAL HIGH (ref 65–99)
Glucose, Bld: 122 mg/dL — ABNORMAL HIGH (ref 65–99)
HCT: 32 % — ABNORMAL LOW (ref 36.0–46.0)
HEMATOCRIT: 30 % — AB (ref 36.0–46.0)
HEMATOCRIT: 31 % — AB (ref 36.0–46.0)
HEMOGLOBIN: 10.9 g/dL — AB (ref 12.0–15.0)
HEMOGLOBIN: 10.2 g/dL — AB (ref 12.0–15.0)
Hemoglobin: 10.5 g/dL — ABNORMAL LOW (ref 12.0–15.0)
POTASSIUM: 3.8 mmol/L (ref 3.5–5.1)
POTASSIUM: 4 mmol/L (ref 3.5–5.1)
Potassium: 4.1 mmol/L (ref 3.5–5.1)
SODIUM: 139 mmol/L (ref 135–145)
SODIUM: 138 mmol/L (ref 135–145)
SODIUM: 138 mmol/L (ref 135–145)
TCO2: 25 mmol/L (ref 0–100)
TCO2: 27 mmol/L (ref 0–100)
TCO2: 29 mmol/L (ref 0–100)

## 2017-04-05 LAB — CBC
HEMATOCRIT: 32.1 % — AB (ref 36.0–46.0)
Hemoglobin: 10.5 g/dL — ABNORMAL LOW (ref 12.0–15.0)
MCH: 30 pg (ref 26.0–34.0)
MCHC: 32.7 g/dL (ref 30.0–36.0)
MCV: 91.7 fL (ref 78.0–100.0)
Platelets: 173 10*3/uL (ref 150–400)
RBC: 3.5 MIL/uL — AB (ref 3.87–5.11)
RDW: 13.3 % (ref 11.5–15.5)
WBC: 6.4 10*3/uL (ref 4.0–10.5)

## 2017-04-05 LAB — POCT I-STAT 4, (NA,K, GLUC, HGB,HCT)
Glucose, Bld: 96 mg/dL (ref 65–99)
HCT: 29 % — ABNORMAL LOW (ref 36.0–46.0)
Hemoglobin: 9.9 g/dL — ABNORMAL LOW (ref 12.0–15.0)
Potassium: 4 mmol/L (ref 3.5–5.1)
Sodium: 140 mmol/L (ref 135–145)

## 2017-04-05 LAB — PROTIME-INR
INR: 1.16
Prothrombin Time: 14.8 s (ref 11.4–15.2)

## 2017-04-05 LAB — APTT: aPTT: 37 seconds — ABNORMAL HIGH (ref 24–36)

## 2017-04-05 SURGERY — IMPLANTATION, AORTIC VALVE, TRANSCATHETER, FEMORAL APPROACH
Anesthesia: Monitor Anesthesia Care

## 2017-04-05 MED ORDER — MIDAZOLAM HCL 2 MG/2ML IJ SOLN
INTRAMUSCULAR | Status: AC
  Filled 2017-04-05: qty 2

## 2017-04-05 MED ORDER — IODIXANOL 320 MG/ML IV SOLN
INTRAVENOUS | Status: DC | PRN
  Administered 2017-04-05: 105 mL via INTRA_ARTERIAL

## 2017-04-05 MED ORDER — FAMOTIDINE IN NACL 20-0.9 MG/50ML-% IV SOLN
20.0000 mg | Freq: Two times a day (BID) | INTRAVENOUS | Status: AC
  Administered 2017-04-05 – 2017-04-06 (×2): 20 mg via INTRAVENOUS
  Filled 2017-04-05 (×2): qty 50

## 2017-04-05 MED ORDER — PHENYLEPHRINE HCL 10 MG/ML IJ SOLN
INTRAMUSCULAR | Status: DC | PRN
  Administered 2017-04-05 (×2): 80 ug via INTRAVENOUS

## 2017-04-05 MED ORDER — FENTANYL CITRATE (PF) 100 MCG/2ML IJ SOLN
INTRAMUSCULAR | Status: DC | PRN
  Administered 2017-04-05: 50 ug via INTRAVENOUS

## 2017-04-05 MED ORDER — LIDOCAINE HCL (PF) 1 % IJ SOLN
INTRAMUSCULAR | Status: DC | PRN
  Administered 2017-04-05: 24 mL

## 2017-04-05 MED ORDER — SODIUM CHLORIDE 0.9 % IV SOLN
INTRAVENOUS | Status: DC | PRN
  Administered 2017-04-05: 1500 mL

## 2017-04-05 MED ORDER — MIDAZOLAM HCL 2 MG/2ML IJ SOLN
INTRAMUSCULAR | Status: DC | PRN
  Administered 2017-04-05 (×2): 1 mg via INTRAVENOUS

## 2017-04-05 MED ORDER — PHENYLEPHRINE HCL 10 MG/ML IJ SOLN
INTRAVENOUS | Status: DC | PRN
  Administered 2017-04-05: 20 ug/min via INTRAVENOUS

## 2017-04-05 MED ORDER — SODIUM CHLORIDE 0.9 % IV SOLN
INTRAVENOUS | Status: AC
  Administered 2017-04-05: 13:00:00 via INTRAVENOUS

## 2017-04-05 MED ORDER — CLOPIDOGREL BISULFATE 75 MG PO TABS
75.0000 mg | ORAL_TABLET | Freq: Every day | ORAL | Status: DC
  Administered 2017-04-06 – 2017-04-07 (×2): 75 mg via ORAL
  Filled 2017-04-05 (×2): qty 1

## 2017-04-05 MED ORDER — LIDOCAINE HCL (PF) 1 % IJ SOLN
INTRAMUSCULAR | Status: AC
  Filled 2017-04-05: qty 30

## 2017-04-05 MED ORDER — PROPOFOL 500 MG/50ML IV EMUL
INTRAVENOUS | Status: DC | PRN
Start: 2017-04-05 — End: 2017-04-05
  Administered 2017-04-05: 25 ug/kg/min via INTRAVENOUS

## 2017-04-05 MED ORDER — ONDANSETRON HCL 4 MG/2ML IJ SOLN
INTRAMUSCULAR | Status: DC | PRN
  Administered 2017-04-05: 4 mg via INTRAVENOUS

## 2017-04-05 MED ORDER — ONDANSETRON HCL 4 MG/2ML IJ SOLN: 4.0000 mg | Freq: Four times a day (QID) | INTRAMUSCULAR | Status: DC | PRN

## 2017-04-05 MED ORDER — DEXTROSE 5 % IV SOLN
1.5000 g | Freq: Two times a day (BID) | INTRAVENOUS | Status: AC
  Administered 2017-04-05 – 2017-04-07 (×4): 1.5 g via INTRAVENOUS
  Filled 2017-04-05 (×4): qty 1.5

## 2017-04-05 MED ORDER — NITROGLYCERIN 0.2 MG/ML ON CALL CATH LAB
INTRAVENOUS | Status: DC | PRN
  Administered 2017-04-05: 40 ug via INTRAVENOUS

## 2017-04-05 MED ORDER — CHLORHEXIDINE GLUCONATE 0.12 % MT SOLN
15.0000 mL | Freq: Once | OROMUCOSAL | Status: AC
  Administered 2017-04-05: 15 mL via OROMUCOSAL
  Filled 2017-04-05: qty 15

## 2017-04-05 MED ORDER — ASPIRIN EC 325 MG PO TBEC: 325.0000 mg | DELAYED_RELEASE_TABLET | Freq: Every day | ORAL | Status: DC

## 2017-04-05 MED ORDER — PROTAMINE SULFATE 10 MG/ML IV SOLN
INTRAVENOUS | Status: DC | PRN
  Administered 2017-04-05: 20 mg via INTRAVENOUS
  Administered 2017-04-05: 10 mg via INTRAVENOUS

## 2017-04-05 MED ORDER — ALBUMIN HUMAN 5 % IV SOLN
250.0000 mL | INTRAVENOUS | Status: DC | PRN
  Filled 2017-04-05: qty 250

## 2017-04-05 MED ORDER — SODIUM CHLORIDE 0.9 % IV SOLN: INTRAVENOUS | Status: DC

## 2017-04-05 MED ORDER — FENTANYL CITRATE (PF) 100 MCG/2ML IJ SOLN
INTRAMUSCULAR | Status: AC
  Filled 2017-04-05: qty 2

## 2017-04-05 MED ORDER — CHLORHEXIDINE GLUCONATE 4 % EX LIQD: 60.0000 mL | Freq: Once | CUTANEOUS | Status: DC

## 2017-04-05 MED ORDER — LEVOTHYROXINE SODIUM 88 MCG PO TABS
88.0000 ug | ORAL_TABLET | Freq: Every day | ORAL | Status: DC
  Administered 2017-04-06 – 2017-04-07 (×2): 88 ug via ORAL
  Filled 2017-04-05 (×2): qty 1

## 2017-04-05 MED ORDER — HEPARIN SODIUM (PORCINE) 1000 UNIT/ML IJ SOLN
INTRAMUSCULAR | Status: DC | PRN
  Administered 2017-04-05: 6000 [IU] via INTRAVENOUS

## 2017-04-05 MED ORDER — ATORVASTATIN CALCIUM 10 MG PO TABS
10.0000 mg | ORAL_TABLET | Freq: Every day | ORAL | Status: DC
  Administered 2017-04-05 – 2017-04-06 (×2): 10 mg via ORAL
  Filled 2017-04-05 (×2): qty 1

## 2017-04-05 MED ORDER — HEPARIN SODIUM (PORCINE) 1000 UNIT/ML IJ SOLN
INTRAMUSCULAR | Status: AC
  Filled 2017-04-05: qty 1

## 2017-04-05 MED ORDER — VANCOMYCIN HCL IN DEXTROSE 1-5 GM/200ML-% IV SOLN
1000.0000 mg | Freq: Once | INTRAVENOUS | Status: AC
  Administered 2017-04-05: 1000 mg via INTRAVENOUS
  Filled 2017-04-05: qty 200

## 2017-04-05 MED ORDER — PANTOPRAZOLE SODIUM 40 MG PO TBEC
40.0000 mg | DELAYED_RELEASE_TABLET | Freq: Every day | ORAL | Status: DC
  Administered 2017-04-06 – 2017-04-07 (×2): 40 mg via ORAL
  Filled 2017-04-05 (×2): qty 1

## 2017-04-05 MED ORDER — LISINOPRIL 5 MG PO TABS
5.0000 mg | ORAL_TABLET | Freq: Every day | ORAL | Status: DC
  Administered 2017-04-06 – 2017-04-07 (×2): 5 mg via ORAL
  Filled 2017-04-05 (×2): qty 1

## 2017-04-05 MED ORDER — PROTAMINE SULFATE 10 MG/ML IV SOLN
INTRAVENOUS | Status: AC
  Filled 2017-04-05: qty 10

## 2017-04-05 MED ORDER — LACTATED RINGERS IV SOLN
INTRAVENOUS | Status: DC | PRN
  Administered 2017-04-05: 09:00:00 via INTRAVENOUS

## 2017-04-05 MED ORDER — MIDAZOLAM HCL 2 MG/2ML IJ SOLN: 2.0000 mg | INTRAMUSCULAR | Status: DC | PRN

## 2017-04-05 MED ORDER — ASPIRIN 81 MG PO CHEW: 324.0000 mg | CHEWABLE_TABLET | Freq: Every day | ORAL | Status: DC

## 2017-04-05 MED ORDER — HEPARIN SODIUM (PORCINE) 1000 UNIT/ML IJ SOLN
INTRAMUSCULAR | Status: AC
  Filled 2017-04-05: qty 2

## 2017-04-05 MED ORDER — PROPOFOL 1000 MG/100ML IV EMUL
INTRAVENOUS | Status: AC
  Filled 2017-04-05: qty 100

## 2017-04-05 MED ORDER — CHLORHEXIDINE GLUCONATE 4 % EX LIQD: 30.0000 mL | CUTANEOUS | Status: DC

## 2017-04-05 SURGICAL SUPPLY — 100 items
ADAPTER UNIV SWAN GANZ BIP (ADAPTER) ×1 IMPLANT
ADAPTER UNV SWAN GANZ BIP (ADAPTER) ×2
ADH SKN CLS APL DERMABOND .7 (GAUZE/BANDAGES/DRESSINGS) ×2
ADPR CATH UNV NS SG CATH (ADAPTER) ×1
BAG BANDED W/RUBBER/TAPE 36X54 (MISCELLANEOUS) ×3 IMPLANT
BAG DECANTER FOR FLEXI CONT (MISCELLANEOUS) IMPLANT
BAG EQP BAND 135X91 W/RBR TAPE (MISCELLANEOUS) ×1
BAG SNAP BAND KOVER 36X36 (MISCELLANEOUS) ×6 IMPLANT
BLADE CLIPPER SURG (BLADE) IMPLANT
BLADE OSCILLATING /SAGITTAL (BLADE) IMPLANT
BLADE STERNUM SYSTEM 6 (BLADE) ×3 IMPLANT
CABLE ADAPT CONN TEMP 6FT (ADAPTER) ×3 IMPLANT
CABLE PACING FASLOC BIEGE (MISCELLANEOUS) ×3 IMPLANT
CABLE PACING FASLOC BLUE (MISCELLANEOUS) ×3 IMPLANT
CANNULA FEM VENOUS REMOTE 22FR (CANNULA) IMPLANT
CANNULA OPTISITE PERFUSION 16F (CANNULA) IMPLANT
CANNULA OPTISITE PERFUSION 18F (CANNULA) IMPLANT
CATH DIAG EXPO 6F VENT PIG 145 (CATHETERS) ×6 IMPLANT
CATH EXPO 5FR AL1 (CATHETERS) ×3 IMPLANT
CATH S G BIP PACING (SET/KITS/TRAYS/PACK) ×6 IMPLANT
CLIP TI MEDIUM 24 (CLIP) ×3 IMPLANT
CLIP TI WIDE RED SMALL 24 (CLIP) ×3 IMPLANT
CONT SPEC 4OZ CLIKSEAL STRL BL (MISCELLANEOUS) ×18 IMPLANT
COVER BACK TABLE 24X17X13 BIG (DRAPES) ×3 IMPLANT
COVER BACK TABLE 60X90IN (DRAPES) ×3 IMPLANT
COVER BACK TABLE 80X110 HD (DRAPES) ×3 IMPLANT
COVER DOME SNAP 22 D (MISCELLANEOUS) ×3 IMPLANT
COVER MAYO STAND STRL (DRAPES) ×3 IMPLANT
CRADLE DONUT ADULT HEAD (MISCELLANEOUS) ×3 IMPLANT
DERMABOND ADVANCED (GAUZE/BANDAGES/DRESSINGS) ×4
DERMABOND ADVANCED .7 DNX12 (GAUZE/BANDAGES/DRESSINGS) ×1 IMPLANT
DEVICE CLOSURE PERCLS PRGLD 6F (VASCULAR PRODUCTS) IMPLANT
DRAPE INCISE IOBAN 66X45 STRL (DRAPES) IMPLANT
DRAPE SLUSH MACHINE 52X66 (DRAPES) ×3 IMPLANT
DRSG TEGADERM 4X4.75 (GAUZE/BANDAGES/DRESSINGS) ×5 IMPLANT
ELECT REM PT RETURN 9FT ADLT (ELECTROSURGICAL) ×6
ELECTRODE REM PT RTRN 9FT ADLT (ELECTROSURGICAL) ×2 IMPLANT
FELT TEFLON 6X6 (MISCELLANEOUS) ×3 IMPLANT
FEMORAL VENOUS CANN RAP (CANNULA) IMPLANT
GAUZE SPONGE 2X2 8PLY STRL LF (GAUZE/BANDAGES/DRESSINGS) IMPLANT
GAUZE SPONGE 4X4 12PLY STRL (GAUZE/BANDAGES/DRESSINGS) ×3 IMPLANT
GLOVE BIO SURGEON STRL SZ8 (GLOVE) ×6 IMPLANT
GLOVE EUDERMIC 7 POWDERFREE (GLOVE) ×6 IMPLANT
GLOVE ORTHO TXT STRL SZ7.5 (GLOVE) ×6 IMPLANT
GOWN STRL REUS W/ TWL LRG LVL3 (GOWN DISPOSABLE) ×3 IMPLANT
GOWN STRL REUS W/ TWL XL LVL3 (GOWN DISPOSABLE) ×6 IMPLANT
GOWN STRL REUS W/TWL LRG LVL3 (GOWN DISPOSABLE) ×9
GOWN STRL REUS W/TWL XL LVL3 (GOWN DISPOSABLE) ×18
GUIDEWIRE SAF TJ AMPL .035X180 (WIRE) ×3 IMPLANT
GUIDEWIRE SAFE TJ AMPLATZ EXST (WIRE) ×3 IMPLANT
GUIDEWIRE STRAIGHT .035 260CM (WIRE) ×3 IMPLANT
INSERT FOGARTY 61MM (MISCELLANEOUS) ×3 IMPLANT
INSERT FOGARTY SM (MISCELLANEOUS) ×6 IMPLANT
INSERT FOGARTY XLG (MISCELLANEOUS) IMPLANT
KIT BASIN OR (CUSTOM PROCEDURE TRAY) ×3 IMPLANT
KIT DILATOR VASC 18G NDL (KITS) IMPLANT
KIT HEART LEFT (KITS) ×3 IMPLANT
KIT ROOM TURNOVER OR (KITS) ×3 IMPLANT
KIT SUCTION CATH 14FR (SUCTIONS) ×6 IMPLANT
NDL PERC 18GX7CM (NEEDLE) ×1 IMPLANT
NEEDLE PERC 18GX7CM (NEEDLE) ×3 IMPLANT
NS IRRIG 1000ML POUR BTL (IV SOLUTION) ×9 IMPLANT
PACK AORTA (CUSTOM PROCEDURE TRAY) ×3 IMPLANT
PAD ARMBOARD 7.5X6 YLW CONV (MISCELLANEOUS) ×6 IMPLANT
PAD ELECT DEFIB RADIOL ZOLL (MISCELLANEOUS) ×3 IMPLANT
PERCLOSE PROGLIDE 6F (VASCULAR PRODUCTS) ×9
SET MICROPUNCTURE 5F STIFF (MISCELLANEOUS) ×3 IMPLANT
SHEATH AVANTI 11CM 8FR (MISCELLANEOUS) ×3 IMPLANT
SHEATH PINNACLE 6F 10CM (SHEATH) ×6 IMPLANT
SLEEVE REPOSITIONING LENGTH 30 (MISCELLANEOUS) ×3 IMPLANT
SPONGE GAUZE 2X2 STER 10/PKG (GAUZE/BANDAGES/DRESSINGS) ×4
SPONGE LAP 4X18 X RAY DECT (DISPOSABLE) ×3 IMPLANT
STOPCOCK MORSE 400PSI 3WAY (MISCELLANEOUS) ×18 IMPLANT
SUT ETHIBOND X763 2 0 SH 1 (SUTURE) ×3 IMPLANT
SUT GORETEX CV 4 TH 22 36 (SUTURE) ×3 IMPLANT
SUT GORETEX CV4 TH-18 (SUTURE) ×9 IMPLANT
SUT GORETEX TH-18 36 INCH (SUTURE) ×6 IMPLANT
SUT MNCRL AB 3-0 PS2 18 (SUTURE) ×3 IMPLANT
SUT PROLENE 3 0 SH1 36 (SUTURE) IMPLANT
SUT PROLENE 4 0 RB 1 (SUTURE) ×3
SUT PROLENE 4-0 RB1 .5 CRCL 36 (SUTURE) ×1 IMPLANT
SUT PROLENE 5 0 C 1 36 (SUTURE) ×6 IMPLANT
SUT PROLENE 6 0 C 1 30 (SUTURE) ×6 IMPLANT
SUT SILK  1 MH (SUTURE) ×2
SUT SILK 1 MH (SUTURE) ×1 IMPLANT
SUT SILK 2 0 SH CR/8 (SUTURE) IMPLANT
SUT VIC AB 2-0 CT1 27 (SUTURE) ×3
SUT VIC AB 2-0 CT1 TAPERPNT 27 (SUTURE) ×1 IMPLANT
SUT VIC AB 2-0 CTX 36 (SUTURE) IMPLANT
SUT VIC AB 3-0 SH 8-18 (SUTURE) ×6 IMPLANT
SYR 10ML LL (SYRINGE) ×9 IMPLANT
SYR 30ML LL (SYRINGE) ×6 IMPLANT
SYR 50ML LL SCALE MARK (SYRINGE) ×3 IMPLANT
TOWEL OR 17X26 10 PK STRL BLUE (TOWEL DISPOSABLE) ×6 IMPLANT
TRANSDUCER W/STOPCOCK (MISCELLANEOUS) ×6 IMPLANT
TRAY FOLEY SILVER 16FR TEMP (SET/KITS/TRAYS/PACK) ×3 IMPLANT
TUBING HIGH PRESSURE 120CM (CONNECTOR) ×3 IMPLANT
VALVE HRT TRANSCATH CERT 26MM (Valve) ×2 IMPLANT
WIRE AMPLATZ SS-J .035X180CM (WIRE) ×3 IMPLANT
WIRE BENTSON .035X145CM (WIRE) ×3 IMPLANT

## 2017-04-05 NOTE — Progress Notes (Addendum)
  Echocardiogram 2D Echocardiogram has been performed.  Sim Choquette L Androw 04/05/2017, 12:26 PM

## 2017-04-05 NOTE — H&P (View-Only) (Signed)
[facility]  MULTIDISCIPLINARY HEART VALVE CLINIC  CARDIOTHORACIC SURGERY CONSULTATION REPORT  Referring Provider is Jonelle Sidle, MD PCP is [name], MD  Chief Complaint  Patient presents with  . Aortic Stenosis    2ND TAVR EVAL    HPI:  The patient is a 76 year old woman with a long smoking history and COPD, hyperlipidemia, hypothyroidism, osteoarthritis and aortic stenosis that has been followed by Dr. Diona Browner. Previous echos have shown moderate AS with normal LV function. She now presents with several months of progressive exertional shortness of breath, fatigue, ankle edema and occasional dizziness. She says that her breath has worsened even over the past two weeks. She had an echo on 02/02/2017 showing progression to severe AS with a mean gradient of 43 mm Hg with an EF of 65-70%. She was seen by Dr. Clifton James and cath on 03/04/2017 showed no significant coronary disease with a mean AV gradient of 37.5 mm hg and a peak to peak of 47 mm Hg. AVA was 0.7 cm2.   She lives in [place] and is widowed. Her husband died with severe rheumatoid arthritis and pulmonary fibrosis with end stage lung disease on home oxygen. She has two daughters and one of them will stay with her postop. She says that she is smoking about 3 cigarettes per day now.   Past Medical History:  Diagnosis Date  . Anxiety    has had panic attacks, none reccently  . Aortic stenosis   . Colon polyp   . Complication of anesthesia    hard time waking up  . COPD (chronic obstructive pulmonary disease) (HCC)   . Diarrhea    since 2016- colon surgery  . Dyspnea    worse May 2018  . Essential hypertension, benign   . GERD (gastroesophageal reflux disease)    in 2016 not currently  . Heart murmur   . History of blood transfusion   . History of pneumonia   . Hypothyroidism   . 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 Mixed hyperlipidemia   . OA  (osteoarthritis of the spine)   . Osteoporosis   . Sigmoid diverticulitis     Past Surgical History:  Procedure Laterality Date  . ABDOMINAL HYSTERECTOMY  1972  . APPENDECTOMY    . COLONOSCOPY  10/15/05  . COLOSTOMY N/A 11/27/2014   Procedure: COLOSTOMY;  Surgeon: Harriette Bouillon, MD;  Location: [facility] OR;  Service: General;  Laterality: N/A;  . COLOSTOMY TAKEDOWN N/A 04/09/2015   Procedure: LAPAROSCOPIC ASSISTED COLOSTOMY CLOSURE; RIGID SIGMOIDOSCOPY;  Surgeon: Harriette Bouillon, MD;  Location: [facility] OR;  Service: General;  Laterality: N/A;  . ESOPHAGOGASTRODUODENOSCOPY  10/15/05  . hammer toes Bilateral    6  . LAPAROSCOPIC SIGMOID COLECTOMY N/A 11/27/2014   Procedure: LAPAROSCOPIC ASSISTED SIGMOID COLECTOMY;  Surgeon: Harriette Bouillon, MD;  Location: [facility] OR;  Service: General;  Laterality: N/A;  . LUMBAR DISC SURGERY     x 2  . RIGHT/LEFT HEART CATH AND CORONARY ANGIOGRAPHY N/A 03/04/2017   Procedure: Right/Left Heart Cath and Coronary Angiography;  Surgeon: Kathleene Hazel, MD;  Location: [facility] INVASIVE CV LAB;  Service: Cardiovascular;  Laterality: N/A;  . ROTATOR CUFF REPAIR Left 2009    Family History  Problem Relation Age of Onset  . Congestive Heart Failure Mother   . COPD Father   . Emphysema Father   . CAD Brother   . Breast cancer Sister   .  Heart disease Sister   . Uterine cancer Daughter     Social History   Social History  . Marital status: Widowed    Spouse name: N/A  . Number of children: N/A  . Years of education: N/A   Occupational History  . retired-worked at [employer]    Social History Main Topics  . Smoking status: Current Some Day Smoker    Packs/day: 0.25    Years: 40.00    Types: Cigarettes    Start date: 08/04/1956    Last attempt to quit: 11/20/2014  . Smokeless tobacco: Never Used     Comment: 2 -3  . Alcohol use No  . Drug use: No  . Sexual activity: No   Other Topics Concern  . Not on file   Social History Narrative  . No  narrative on file    Current Outpatient Prescriptions  Medication Sig Dispense Refill  . aspirin 81 MG tablet Take 81 mg by mouth every other day.     Marland Kitchen. atorvastatin (LIPITOR) 10 MG tablet Take 10 mg by mouth at bedtime.     . Calcium Carbonate-Vitamin D (CALCIUM 500 + D PO) Take 1 tablet by mouth every other day.    . levothyroxine (SYNTHROID, LEVOTHROID) 88 MCG tablet Take 88 mcg by mouth daily before breakfast.    . lisinopril (PRINIVIL,ZESTRIL) 5 MG tablet Take 5 mg by mouth daily.    . Multiple Vitamin (MULTIVITAMIN WITH MINERALS) TABS tablet Take 1 tablet by mouth every other day.    . pantoprazole (PROTONIX) 40 MG tablet Take 40 mg by mouth daily.     No current facility-administered medications for this visit.     Allergies  Allergen Reactions  . Bactrim [Sulfamethoxazole-Trimethoprim] Hives, Itching and Other (See Comments)    Irregular heart beat  . Codeine Palpitations    Dizziness, sweats       Review of Systems:   General:  decreased appetite, decreased energy, no weight gain, some weight loss, no fever  Cardiac:  no chest pain with exertion, no chest pain at rest, has SOB with mild exertion, some resting SOB, no PND, no orthopnea, has palpitations, no arrhythmia, no atrial fibrillation, some LE edema, occasional dizzy spells, no syncope  Respiratory:  exertional shortness of breath, no home oxygen, no productive cough, has dry cough, no bronchitis, has wheezing, no hemoptysis, no asthma, no pain with inspiration or cough, no sleep apnea, no CPAP at night  GI:   no difficulty swallowing, no reflux, no frequent heartburn, no hiatal hernia, no abdominal pain, no constipation, hqw diarrhea, no hematochezia, no hematemesis, no melena  GU:   no dysuria,  no frequency, no urinary tract infection, no hematuria, no enlarged prostate, no kidney stones, no kidney disease  Vascular:  no pain suggestive of claudication, no pain in feet, has leg cramps, no varicose veins, no DVT, no  non-healing foot ulcer  Neuro:   no stroke, no TIA's, no seizures, no headaches, no temporary blindness one eye,  no slurred speech, no peripheral neuropathy, no chronic pain, no instability of gait, no memory/cognitive dysfunction  Musculoskeletal: has arthritis, has joint swelling, no myalgias, no difficulty walking, normal mobility   Skin:   no rash, no itching, no skin infections, no pressure sores or ulcerations  Psych:   no anxiety, no depression, has nervousness, no unusual recent stress  Eyes:   no blurry vision, no floaters, has recent vision changes, no wears glasses ENT:   has hearing loss,  no loose or painful teeth, full dentures  Hematologic:  has easy bruising, no abnormal bleeding, no clotting disorder, no frequent epistaxis  Endocrine:  no diabetes, does not check CBG's at home      Physical Exam:   BP (!) 141/81 (BP Location: Left Arm, Patient Position: Sitting, Cuff Size: Normal)   Pulse 86   Resp 16   Ht 5\' 3"  (1.6 m)   Wt 112 lb (50.8 kg)   SpO2 94% Comment: ON RA  BMI 19.84 kg/m   General:  Elderly but  well-appearing  HEENT:  Unremarkable, NCAT, PERLA, EOMI, oropharynx clear  Neck:   no JVD, no bruits, no adenopathy or thyromegaly  Chest:   clear to auscultation, symmetrical breath sounds, no wheezes, no rhonchi   CV:   RRR, grade III/VI crescendo/decrescendo murmur heard best at RSB,  no diastolic murmur  Abdomen:  soft, non-tender, no masses or organomegaly  Extremities:  warm, well-perfused, pulses palpable, trace bilateral ankle edema  Rectal/GU  Deferred  Neuro:   Grossly non-focal and symmetrical throughout  Skin:   Clean and dry, no rashes, no breakdown   Diagnostic Tests:                 *[facility]*                   [address]                           [place], [place] [address]                            [phone]  ------------------------------------------------------------------- Transthoracic Echocardiography  Patient:     Kamea, Dacosta MR #:       [id number] Study Date: 02/02/2017 Gender:     F Age:        85 Height:     160 cm Weight:     54.4 kg BSA:        1.56 m^2 Pt. Status: Room:   Lindi Adie, M.D.  REFERRING    [name], M.D.  SONOGRAPHER  [address], RVT, RDCS  ATTENDING    Prentice Docker, MD  PERFORMING   [facility]  cc:  ------------------------------------------------------------------- LV EF: 65% -   70%  ------------------------------------------------------------------- History:   PMH:   Dyspnea. SOB seems a bit worse, per patient. She has had a difficult &quot;health year&quot; and is somewhat deconditioned as well.  Chronic obstructive pulmonary disease.  Risk factors: Lifelong nonsmoker. Hypertension.  ------------------------------------------------------------------- Study Conclusions  - Left ventricle: The cavity size was normal. Systolic function was   vigorous. The estimated ejection fraction was in the range of 65%   to 70%. Wall motion was normal; there were no regional wall   motion abnormalities. Mild concentric and severe focal basal   septal hypertrophy. - Aortic valve: Mildly to moderately calcified annulus. Severely   calcified leaflets. There was severe stenosis. There was mild   regurgitation. Peak velocity (S): 405 cm/s. Mean gradient (S): 43   mm Hg. Valve area (VTI): 0.69 cm^2. Valve area (Vmax): 0.77 cm^2.   Valve area (Vmean): 0.74 cm^2. - Mitral valve: Calcified annulus. Mildly calcified leaflets .   There was mild regurgitation. - Right ventricle: Systolic function was mildly reduced. - Tricuspid valve: Mildly thickened leaflets. There was mild   regurgitation.  -------------------------------------------------------------------  Study data:  Comparison was made to the study of 07/28/2016. Previous Echo showed moderate to severe AS with mild AI. Mean gradient was 37 mmHg. Mild LVH with basal septal  hypertrophy, LVEF 65-70%, Grade 1 DD.  Study status:  Routine.  Procedure: Transthoracic echocardiography. Image quality was adequate.  Study completion:  There were no complications.          Transthoracic echocardiography.  M-mode, complete 2D, spectral Doppler, and color Doppler.  Birthdate:  Patient birthdate: 1941/09/02.  Age:  Patient is 76 yr old.  Sex:  Gender: female.    BMI: 21.3 kg/m^2.  Blood pressure:     106/72  Patient status:  Outpatient.  Study date: Study date: 02/02/2017. Study time: 10:00 AM.  -------------------------------------------------------------------  ------------------------------------------------------------------- Left ventricle:  The cavity size was normal. Systolic function was vigorous. The estimated ejection fraction was in the range of 65% to 70%. Wall motion was normal; there were no regional wall motion abnormalities. Mild concentric and severe focal basal septal hypertrophy.  ------------------------------------------------------------------- Aortic valve:   Mildly to moderately calcified annulus. Severely calcified leaflets.  Doppler:   There was severe stenosis.   There was mild regurgitation.    VTI ratio of LVOT to aortic valve: 0.27. Valve area (VTI): 0.69 cm^2. Indexed valve area (VTI): 0.44 cm^2/m^2. Peak velocity ratio of LVOT to aortic valve: 0.3. Valve area (Vmax): 0.77 cm^2. Indexed valve area (Vmax): 0.49 cm^2/m^2. Mean velocity ratio of LVOT to aortic valve: 0.29. Valve area (Vmean): 0.74 cm^2. Indexed valve area (Vmean): 0.47 cm^2/m^2. Mean gradient (S): 43 mm Hg. Peak gradient (S): 66 mm Hg.  ------------------------------------------------------------------- Aorta:  Aortic root: The aortic root was normal in size. Ascending aorta: The ascending aorta was normal in size.  ------------------------------------------------------------------- Mitral valve:   Calcified annulus. Mildly calcified leaflets . Doppler:  There was  mild regurgitation.    Peak gradient (D): 2 mm Hg.  ------------------------------------------------------------------- Left atrium:  The atrium was normal in size.  ------------------------------------------------------------------- Atrial septum:  No defect or patent foramen ovale was identified.   ------------------------------------------------------------------- Right ventricle:  The cavity size was normal. Wall thickness was normal. Systolic function was mildly reduced.  ------------------------------------------------------------------- Pulmonic valve:   Poorly visualized.  Doppler:  There was trivial regurgitation.  ------------------------------------------------------------------- Tricuspid valve:   Mildly thickened leaflets.  Doppler:  There was mild regurgitation.  ------------------------------------------------------------------- Right atrium:  The atrium was normal in size.  ------------------------------------------------------------------- Pericardium:  There was no pericardial effusion.  ------------------------------------------------------------------- Systemic veins: Inferior vena cava: The vessel was normal in size. The respirophasic diameter changes were in the normal range (>= 50%), consistent with normal central venous pressure.  ------------------------------------------------------------------- Measurements   Left ventricle                           Value          Reference  LV ID, ED, PLAX chordal           (L)    25.4  mm       43 - 52  LV ID, ES, PLAX chordal                  24.9  mm       23 - 38  LV fx shortening, PLAX chordal    (L)    2     %        >=29  LV PW thickness, ED  12.5  mm       ----------  IVS/LV PW ratio, ED               (H)    1.68           <=1.3  Stroke volume, 2D                        67    ml       ----------  Stroke volume/bsa, 2D                    43    ml/m^2   ----------  LV ejection  fraction, 1-p A4C            84    %        ----------  LV end-diastolic volume, 2-p             43    ml       ----------  LV end-systolic volume, 2-p              9     ml       ----------  LV ejection fraction, 2-p                79    %        ----------  Stroke volume, 2-p                       34    ml       ----------  LV end-diastolic volume/bsa, 2-p         28    ml/m^2   ----------  LV end-systolic volume/bsa, 2-p          6     ml/m^2   ----------  Stroke volume/bsa, 2-p                   21.8  ml/m^2   ----------  LV e&', lateral                           3.67  cm/s     ----------  LV E/e&', lateral                         20.27          ----------  LV e&', medial                            4.45  cm/s     ----------  LV E/e&', medial                          16.72          ----------  LV e&', average                           4.06  cm/s     ----------  LV E/e&', average                         18.33          ----------  Longitudinal strain, TDI  30    %        ----------    Ventricular septum                       Value          Reference  IVS thickness, ED                        21    mm       ----------    LVOT                                     Value          Reference  LVOT ID, S                               18    mm       ----------  LVOT area                                2.54  cm^2     ----------  LVOT peak velocity, S                    123   cm/s     ----------  LVOT mean velocity, S                    90.4  cm/s     ----------  LVOT VTI, S                              26.3  cm       ----------  LVOT peak gradient, S                    6     mm Hg    ----------    Aortic valve                             Value          Reference  Aortic valve peak velocity, S            405   cm/s     ----------  Aortic valve mean velocity, S            311   cm/s     ----------  Aortic valve VTI, S                      97.2  cm       ----------  Aortic mean  gradient, S                  43    mm Hg    ----------  Aortic peak gradient, S                  66    mm Hg    ----------  VTI ratio, LVOT/AV                       0.27           ----------  Aortic valve area, VTI                   0.69  cm^2     ----------  Aortic valve area/bsa, VTI               0.44  cm^2/m^2 ----------  Velocity ratio, peak, LVOT/AV            0.3            ----------  Aortic valve area, peak velocity         0.77  cm^2     ----------  Aortic valve area/bsa, peak              0.49  cm^2/m^2 ----------  velocity  Velocity ratio, mean, LVOT/AV            0.29           ----------  Aortic valve area, mean velocity         0.74  cm^2     ----------  Aortic valve area/bsa, mean              0.47  cm^2/m^2 ----------  velocity  Aortic regurg pressure half-time         441   ms       ----------    Aorta                                    Value          Reference  Aortic root ID, ED                       31    mm       ----------  Ascending aorta ID, A-P, S               34    mm       ----------    Left atrium                              Value          Reference  LA ID, A-P, ES                           39    mm       ----------  LA ID/bsa, A-P                    (H)    2.51  cm/m^2   <=2.2  LA volume, S                             28.8  ml       ----------  LA volume/bsa, S                         18.5  ml/m^2   ----------  LA volume, ES, 1-p A4C                   30.2  ml       ----------  LA volume/bsa, ES, 1-p A4C               19.4  ml/m^2   ----------  LA volume, ES, 1-p A2C                   27.2  ml       ----------  LA volume/bsa, ES, 1-p A2C               17.5  ml/m^2   ----------    Mitral valve                             Value          Reference  Mitral E-wave peak velocity              74.4  cm/s     ----------  Mitral A-wave peak velocity              133   cm/s     ----------  Mitral deceleration time          (H)    479   ms       150 - 230  Mitral  peak gradient, D                  2     mm Hg    ----------  Mitral E/A ratio, peak                   0.6            ----------    Tricuspid valve                          Value          Reference  Tricuspid peak RV-RA gradient            23    mm Hg    ----------    Right atrium                             Value          Reference  RA ID, S-I, ES, A4C                      34    mm       34 - 49  RA area, ES, A4C                         8.77  cm^2     8.3 - 19.5  RA volume, ES, A/L                       17.9  ml       ----------  RA volume/bsa, ES, A/L                   11.5  ml/m^2   ----------    Right ventricle                          Value          Reference  TAPSE                                    16.7  mm       ----------  RV s&', lateral, S                        8.8   cm/s     ----------  Legend: (L)  and  (H)  mark values outside specified reference range.  ------------------------------------------------------------------- Prepared and Electronically Authenticated by  Prentice Docker, MD 2018-04-18T17:08:03   Physicians   Panel Physicians Referring Physician Case Authorizing Physician  Kathleene Hazel, MD (Primary)    Procedures   Right/Left Heart Cath and Coronary Angiography  Conclusion     Ost RCA to Prox RCA lesion, 20 %stenosed.  There is severe aortic valve stenosis.  LV end diastolic pressure is normal.   1. Mild non-obstructive CAD 2. Normal filling pressures 3. Severe aortic valve stenosis (mean gradient 37.5 mmHg, peak to peak gradient 47 mmHg, AVA 0.70)  Recommendations: Continue workup for TAVR   Indications   Severe aortic stenosis [I35.0 (ICD-10-CM)]  Procedural Details/Technique   Technical Details Indication: 76 yo female with severe aortic stenosis, workup for TAVR  Procedure: The risks, benefits, complications, treatment options, and expected outcomes were discussed with the patient. The patient and/or family  concurred with the proposed plan, giving informed consent. The patient was brought to the cath lab after IV hydration was begun and oral premedication was given. The patient was further sedated with Versed and Fentanyl. There was an IV catheter present in the right antecubital vein. I changed this out for a 5 [place] sheath. I then performed a right heart cath with a balloon tipped catheter. The right wrist was prepped and draped in a sterile fashion. 1% lidocaine was used for local anesthesia. Using the modified Seldinger access technique, a 5 French sheath was placed in the right radial artery. 3 mg Verapamil was given through the sheath. 3000 units IV heparin was given. Standard diagnostic catheters were used to perform selective coronary angiography. I crossed the aortic valve with an AL-1 catheter and a straight wire. LV pressures measured. No LV gram. The sheath was removed from the right radial artery and a Terumo hemostasis band was applied at the arteriotomy site on the right wrist.     Estimated blood loss <50 mL.  During this procedure the patient was administered the following to achieve and maintain moderate conscious sedation: Versed 2 mg, Fentanyl 50 mcg, while the patient's heart rate, blood pressure, and oxygen saturation were continuously monitored. The period of conscious sedation was 32 minutes, of which I was present face-to-face 100% of this time.    Complications   Complications documented before study signed (03/07/2017 6:57 AM EDT)    RIGHT/LEFT HEART CATH AND CORONARY ANGIOGRAPHY   None Documented by Kathleene Hazel, MD 03/04/2017 4:03 PM EDT  Time Range: Intra-procedure      Coronary Findings   Dominance: Right  Left Anterior Descending  Vessel is large. Vessel is angiographically normal.  First Septal Branch  Vessel is small in size.  Second Septal Branch  Vessel is small in size.  Third Septal Branch  Vessel is small in size.  Ramus Intermedius    Vessel is small.  Left Circumflex  Vessel is large. Vessel is angiographically normal.  First Obtuse Marginal Branch  Vessel is small in size.  Second Obtuse Marginal Branch  Vessel is moderate in size.  Third Obtuse Marginal Branch  Vessel is moderate in size.  Right Coronary Artery  Vessel is large.  Ost RCA to Prox RCA lesion, 20% stenosed.  Right  Posterior Descending Artery  Vessel is large in size.  Right Heart   Right Heart Pressures LV EDP is normal.    Left Heart   Aortic Valve There is severe aortic valve stenosis. The aortic valve is calcified.    Coronary Diagrams   Diagnostic Diagram       Implants     No implant documentation for this case.  PACS Images   Show images for Cardiac catheterization   Link to Procedure Log   Procedure Log    Hemo Data    Most Recent Value  Fick Cardiac Output 3.97 L/min  Fick Cardiac Output Index 2.59 (L/min)/BSA  Aortic Mean Gradient 37.5 mmHg  Aortic Peak Gradient 47 mmHg  Aortic Valve Area 0.70  Aortic Value Area Index 0.46 cm2/BSA  RA A Wave 3 mmHg  RA V Wave 3 mmHg  RA Mean 3 mmHg  RV Systolic Pressure 20 mmHg  RV Diastolic Pressure -1 mmHg  RV EDP 3 mmHg  PA Systolic Pressure 22 mmHg  PA Diastolic Pressure 5 mmHg  PA Mean 12 mmHg  PW A Wave 4 mmHg  PW V Wave 6 mmHg  PW Mean 2 mmHg  AO Systolic Pressure 133 mmHg  AO Diastolic Pressure 63 mmHg  AO Mean 91 mmHg  LV Systolic Pressure 178 mmHg  LV Diastolic Pressure 3 mmHg  LV EDP 7 mmHg  Arterial Occlusion Pressure Extended Systolic Pressure 130 mmHg  Arterial Occlusion Pressure Extended Diastolic Pressure 56 mmHg  Arterial Occlusion Pressure Extended Mean Pressure 86 mmHg  Left Ventricular Apex Extended Systolic Pressure 177 mmHg  Left Ventricular Apex Extended Diastolic Pressure 1 mmHg  Left Ventricular Apex Extended EDP Pressure 10 mmHg  QP/QS 1  TPVR Index 4.63 HRUI  TSVR Index 35.08 HRUI  PVR SVR Ratio 0.11  TPVR/TSVR Ratio 0.13     ADDENDUM REPORT: 03/09/2017 12:37  CLINICAL DATA:  76 year old female with severe aortic stenosis.  EXAM: Cardiac TAVR CT  TECHNIQUE: The patient was scanned on a Philips 256 scanner. A 120 kV retrospective scan was triggered in the descending thoracic aorta at 111 HU's. Gantry rotation speed was 270 msecs and collimation was .9 mm. 5 mg of iv Metoprolol and nitro were given. The 3D data set was reconstructed in 5% intervals of the R-R cycle. Systolic and diastolic phases were analyzed on a dedicated work station using MPR, MIP and VRT modes. The patient received 80 cc of contrast.  FINDINGS: Aortic Valve: Trileaflet, severely thickened and moderately calcified with severely restricted leaflet opening. There are no calcifications extending into LVOT.  Aorta: Normal size, no dissection, moderate diffuse calcifications and atherosclerotic plaque.  Sinotubular Junction:  28 x 27 mm  Ascending Thoracic Aorta:  32 x 32 mm  Aortic Arch:  23 x 22 mm  Descending Thoracic Aorta:  24 x 23 mm  Sinus of Valsalva Measurements:  Non-coronary:  29 mm  Right -coronary:  29 mm  Left -coronary:  30 mm  Coronary Artery Height above Annulus:  Left Main:  11 mm  Right Coronary:  14 mm  Virtual Basal Annulus Measurements:  Maximum/Minimum Diameter:  25 x 22 mm  Perimeter:  75 mm  Area:  438 mm2  Optimum Fluoroscopic Angle for Delivery:  LAO 10 CAU 11  There is a large multilobar left atrial appendage with no evidence for a thrombus.  IMPRESSION: 1. Trileaflet, severely thickened and moderately calcified with severely restricted leaflet opening. There are no calcifications extending into LVOT. Annular measurement suitable  for a delivery of a 26 mm Edwards-SAPIEN 3 valve.  2. Sufficient annulus to coronary distance.  3. Optimum Fluoroscopic Angle for Delivery:  LAO 10 CAU 11  4.  No left atrial appendage thrombus.  Tobias Alexander   Electronically Signed   By: Tobias Alexander   On: 03/09/2017 12:37   Addended by Lars Masson, MD on 03/09/2017 12:40 PM    Study Result   EXAM: OVER-READ INTERPRETATION  CT CHEST  The following report is an over-read performed by radiologist Dr. [facility] on 03/09/2017. This over-read does not include interpretation of cardiac or coronary anatomy or pathology. The coronary calcium score/coronary CTA interpretation by the cardiologist is attached.  COMPARISON:  None.  FINDINGS: Extracardiac findings will be discussed separately under dictation for contemporaneously obtained CTA of the chest, abdomen and pelvis.  IMPRESSION: Please see separate dictation for contemporaneously obtained CTA of the chest, abdomen and pelvis 03/09/2017 for full description of extracardiac findings.  Electronically Signed: By: Trudie Reed M.D. On: 03/09/2017 12:10       CLINICAL DATA:  76 year old female with history of severe aortic stenosis. Preprocedural study prior to potential transcatheter aortic valve replacement (TAVR) procedure.  EXAM: CT ANGIOGRAPHY CHEST, ABDOMEN AND PELVIS  TECHNIQUE: Multidetector CT imaging through the chest, abdomen and pelvis was performed using the standard protocol during bolus administration of intravenous contrast. Multiplanar reconstructed images and MIPs were obtained and reviewed to evaluate the vascular anatomy.  CONTRAST:  75 mL of Isovue 370.  COMPARISON:  CT the abdomen and pelvis 11/22/2014.  FINDINGS: CTA CHEST FINDINGS  Cardiovascular: Heart size is normal with left ventricular concentric hypertrophy. There is no significant pericardial fluid, thickening or pericardial calcification. There is aortic atherosclerosis, as well as atherosclerosis of the great vessels of the mediastinum and the coronary arteries, including calcified atherosclerotic plaque in the left  main, left anterior descending and right coronary arteries. Thickening calcification of the aortic valve. Thickening of the mitral valve with mild mitral annular calcifications.  Mediastinum/Lymph Nodes: No pathologically enlarged mediastinal or hilar lymph nodes. Esophagus is unremarkable in appearance. No axillary lymphadenopathy.  Lungs/Pleura: Calcified granuloma in the left lower lobe. No larger more suspicious appearing pulmonary nodules or masses are noted. Mild diffuse bronchial wall thickening with mild to moderate centrilobular and mild paraseptal emphysema. No acute consolidative airspace disease. No pleural effusions.  Musculoskeletal/Soft Tissues: There are no aggressive appearing lytic or blastic lesions noted in the visualized portions of the skeleton. Soft tissue anchor in the left humeral head, presumably from prior rotator cuff surgery.  CTA ABDOMEN AND PELVIS FINDINGS  Hepatobiliary: No cystic or solid hepatic lesions. No intra or extrahepatic biliary ductal dilatation. 4 mm calcified gallstone lying dependently in the gallbladder. Gallbladder is otherwise unremarkable in appearance.  Pancreas: No pancreatic mass. No pancreatic ductal dilatation. No pancreatic or peripancreatic fluid or inflammatory changes.  Spleen: Unremarkable.  Adrenals/Urinary Tract: Subcentimeter low-attenuation lesion in the lower pole of left kidney. Right kidney and bilateral adrenal glands are normal in appearance. There is no hydroureteronephrosis. Urinary bladder is unremarkable in appearance.  Stomach/Bowel: The appearance of the stomach is normal. There is no pathologic dilatation of small bowel or colon. The appendix is not confidently identified and may be surgically absent. Regardless, there are no inflammatory changes noted adjacent to the cecum to suggest the presence of an acute appendicitis at this time.  Vascular/Lymphatic: Aortic atherosclerosis, without  evidence of aneurysm or dissection in the abdominal or pelvic  vasculature. Vascular findings and measurements pertinent to potential TAVR, as detailed below. Celicomesenteric trunk (normal anatomical variant with common origin of the celiac axis and superior mesenteric artery from the abdominal aorta) incidentally noted. 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 Inferior mesenteric artery is patent. Bilateral renal arteries are widely patent. No lymphadenopathy noted in the abdomen or pelvis.  Reproductive: Status post hysterectomy. Ovaries are not confidently identified may be surgically absent or atrophic.  Other: No significant volume of ascites.  No pneumoperitoneum.  Musculoskeletal: There are no aggressive appearing lytic or blastic lesions noted in the visualized portions of the skeleton.  VASCULAR MEASUREMENTS PERTINENT TO TAVR:  AORTA:  Minimal Aortic Diameter -  11 x 10 mm  Severity of Aortic Calcification -  severe  RIGHT PELVIS:  Right Common Iliac Artery -  Minimal Diameter - 6.0 x 5.5 mm  Tortuosity - mild  Calcification - moderate  Right External Iliac Artery -  Minimal Diameter - 4.3 x 4.4 mm  Tortuosity - mild  Calcification - mild  Right Common Femoral Artery -  Minimal Diameter - 5.7 x 3.9 mm  Tortuosity - mild  Calcification - moderate  LEFT PELVIS:  Left Common Iliac Artery -  Minimal Diameter - 6.3 x 5.5 mm  Tortuosity - mild  Calcification - moderate  Left External Iliac Artery -  Minimal Diameter - 4.8 x 5.1 mm  Tortuosity - mild  Calcification - minimal  Left Common Femoral Artery -  Minimal Diameter - 4.5 x 3.5  mm  Tortuosity - mild  Calcification - moderate  Review of the MIP images confirms the above findings.  IMPRESSION: 1. Vascular findings and measurements pertinent to potential TAVR procedure, as detailed above. This patient does not appear to have suitable pelvic arterial access on either side. 2. Severe thickening calcification of the aortic valve, compatible with the reported clinical history of severe aortic stenosis. There is also what appears to be associated concentric left ventricular hypertrophy. 3. Aortic atherosclerosis, in addition to left main and 2 vessel coronary artery disease. Assessment for potential risk factor modification, dietary therapy or pharmacologic therapy may be warranted, if clinically indicated. 4. Celicomesenteric trunk (normal anatomical variant with common origin of the celiac axis and superior mesenteric artery from the abdominal aorta) incidentally noted. 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 5. Mild diffuse bronchial wall thickening with mild to moderate centrilobular and mild paraseptal emphysema; imaging findings suggestive of underlying COPD. 6. Additional incidental findings, as above.   Electronically Signed   By: Trudie Reed M.D.   On: 03/09/2017 13:08   Impression:  This 76 year old woman has stage D, severe, symptomatic aortic stenosis with NYHA class II symptoms of exertional fatigue and shortness of breath, with occasional dizziness consistent with chronic diastolic heart failure. Her symptoms have been worsening over the past two weeks. I have personally reviewed her echo, cath and CTA studies. Her echo shows a trileaflet aortic valve with severe calcification and thickening of all three leaflets with restricted mobility and a mean gradient of 43 mm Hg. LVEF is normal. Cath confirms  severe AS and shows no significant CAD. I agree that AVR is indicated in this patient who is still very functional and would like to remain active. I agree that her risk for open surgical AVR is moderately elevated due to her age, severe COPD, and other comorbid factors. I think TAVR would be a better option for her with less operative risk and a much quicker recovery. Her gated cardiac CT shows anatomy favorable for a Sapien 3 valve. Her abdominal and pelvic CT shows relatively small pelvic arterial vessels with mild to moderate atherosclerosis but I think they are probably adequate for a transfemoral insertion with the left side being slightly larger than the right. There is no concentric calcification.   The patient was counseled at length regarding treatment alternatives for management of severe symptomatic aortic stenosis. The risks and benefits of surgical intervention has been discussed in detail. Long-term prognosis with medical therapy was discussed. Alternative approaches such as conventional surgical aortic valve replacement, transcatheter aortic valve replacement, and palliative medical therapy were compared and contrasted at length. This discussion was placed in the context of the patient's own specific clinical presentation and past medical history. All of their questions been addressed. The patient is eager to proceed with surgical management as soon as possible.   Following the decision to proceed with transcatheter aortic valve replacement, a discussion was held regarding what types of management strategies would be attempted intraoperatively in the event of life-threatening complications, including whether or not the patient would be considered a candidate for the use of cardiopulmonary bypass and/or conversion to open sternotomy for attempted surgical intervention. The patient has been advised of a variety of complications that might develop including but not limited to risks of death, stroke,  paravalvular leak, aortic dissection or other major vascular complications, aortic annulus rupture, device embolization, cardiac rupture or perforation, mitral regurgitation, acute myocardial infarction, arrhythmia, heart block or bradycardia requiring permanent pacemaker placement, congestive heart failure, respiratory failure, renal failure, pneumonia, infection, other late complications related to structural valve deterioration or migration, or other complications that might ultimately cause a temporary or permanent loss of functional independence or other long term morbidity. She also understands that if transfemoral insertion is not possible then trans-apical or trans-aortic insertion may be necessary. The patient provides full informed consent for the procedure as described and all questions were answered.     Plan:  Transfemoral TAVR on 04/05/2017, 2nd case by Dr. Cornelius Moras and Dr. Clifton James.    I spent 45 minutes performing this consultation and > 50% of this time was spent face to face counseling and coordinating the care of this patient's severe aortic stenosis.    Alleen Borne, MD 03/31/2017 4:32 PM

## 2017-04-05 NOTE — Interval H&P Note (Signed)
History and Physical Interval Note:  04/05/2017 10:05 AM  April Hull  has presented today for surgery, with the diagnosis of SEVERE AS  The various methods of treatment have been discussed with the patient and family. After consideration of risks, benefits and other options for treatment, the patient has consented to  Procedure(s): TRANSCATHETER AORTIC VALVE REPLACEMENT, TRANSFEMORAL (N/A) TRANSESOPHAGEAL ECHOCARDIOGRAM (TEE) (N/A) as a surgical intervention .  The patient's history has been reviewed, patient examined, no change in status, stable for surgery.  I have reviewed the patient's chart and labs.  Questions were answered to the patient's satisfaction.     Verne Carrowhristopher Kimiah Hibner

## 2017-04-05 NOTE — Anesthesia Postprocedure Evaluation (Signed)
Anesthesia Post Note  Patient: LORRINE KILLILEA  Procedure(s) Performed: Procedure(s) (LRB): TRANSCATHETER AORTIC VALVE REPLACEMENT, TRANSFEMORAL (N/A) TRANSESOPHAGEAL ECHOCARDIOGRAM (TEE) (N/A)     Patient location during evaluation: SICU Anesthesia Type: MAC Level of consciousness: awake and alert Pain management: pain level controlled Vital Signs Assessment: post-procedure vital signs reviewed and stable Respiratory status: spontaneous breathing, nonlabored ventilation, respiratory function stable and patient connected to face mask oxygen Cardiovascular status: stable and blood pressure returned to baseline Anesthetic complications: no    Last Vitals:  Vitals:   04/05/17 1325 04/05/17 1330  BP: (!) 70/45 (!) 119/53  Pulse:    Resp: (!) 22 (!) 21  Temp:      Last Pain:  Vitals:   04/05/17 1312  TempSrc: Oral                 Race Latour,W. EDMOND

## 2017-04-05 NOTE — Anesthesia Procedure Notes (Signed)
Central Venous Catheter Insertion Performed by: Gaynelle AduFITZGERALD, Shannan Garfinkel, anesthesiologist Start/End6/19/2018 9:40 AM, 04/05/2017 9:50 AM Patient location: Pre-op. Preanesthetic checklist: patient identified, IV checked, site marked, risks and benefits discussed, surgical consent, monitors and equipment checked, pre-op evaluation, timeout performed and anesthesia consent Position: Trendelenburg Lidocaine 1% used for infiltration and patient sedated Hand hygiene performed , maximum sterile barriers used  and Seldinger technique used Catheter size: 8 Fr Total catheter length 16. Central line was placed.Double lumen Procedure performed using ultrasound guided technique. Ultrasound Notes:anatomy identified, needle tip was noted to be adjacent to the nerve/plexus identified, no ultrasound evidence of intravascular and/or intraneural injection and image(s) printed for medical record Attempts: 1 Following insertion, dressing applied, line sutured and Biopatch. Post procedure assessment: blood return through all ports  Patient tolerated the procedure well with no immediate complications.

## 2017-04-05 NOTE — Transfer of Care (Signed)
Immediate Anesthesia Transfer of Care Note  Patient: April Hull  Procedure(s) Performed: Procedure(s): TRANSCATHETER AORTIC VALVE REPLACEMENT, TRANSFEMORAL (N/A) TRANSESOPHAGEAL ECHOCARDIOGRAM (TEE) (N/A)  Patient Location: SICU  Anesthesia Type:MAC  Level of Consciousness: awake, alert  and oriented  Airway & Oxygen Therapy: Patient Spontanous Breathing and Patient connected to face mask oxygen  Post-op Assessment: Report given to RN, Post -op Vital signs reviewed and stable and Patient moving all extremities X 4  Post vital signs: Reviewed and stable  Last Vitals:  Vitals:   04/05/17 1312 04/05/17 1320  BP:  (!) 69/39  Pulse:    Resp:  18  Temp: 36.5 C     Last Pain:  Vitals:   04/05/17 1312  TempSrc: Oral         Complications: No apparent anesthesia complications

## 2017-04-05 NOTE — Op Note (Signed)
[facility]   MULTIDISCIPLINARY HEART VALVE TEAM   TAVR OPERATIVE NOTE   Date of Procedure:  04/05/2017  Preoperative Diagnosis: Severe Aortic Stenosis   Postoperative Diagnosis: Same   Procedure:    Transcatheter Aortic Valve Replacement - Percutaneous Left Transfemoral Approach  Edwards Sapien 3 THV (size 26 mm, model # 9600TFX, serial # [id number])   Co-Surgeons:  Salvatore Decent. Cornelius Moras, MD and Verne Carrow, MD  Anesthesiologist:  Gaynelle Adu, MD  Echocardiographer:  Marca Ancona, MD  Pre-operative Echo Findings:  Severe aortic stenosis  Normal LV systolic function  Post-operative Echo Findings:  Trivial paravalvular leak  Normal left ventricular systolic function   BRIEF CLINICAL NOTE AND INDICATIONS FOR SURGERY  Patient is a 76 year old female with history of aortic stenosis, hypertension, long-standing tobacco abuse with COPD, hypothyroidism, hyperlipidemia, and osteoarthritis who has been referred for surgical consultation to discuss treatment options for management of severe symptomatic aortic stenosis. The patient states that she has known about the presence of a heart murmur for many years. For the last several years she has been followed by Dr. Diona Browner with known history of aortic stenosis. Previous echocardiograms have documented the presence of moderate aortic stenosis with normal left ventricular function, and clinically the patient has done well until recently. Over the past several months the patient has developed progressive symptoms of exertional shortness of breath, palpitations, lower extremity edema, and occasional dizzy spells.  Follow-up transthoracic echocardiogram performed 02/02/2017 revealed significant progression of disease with severe aortic stenosis and normal left ventricular systolic function. Peak velocity across the aortic valve measured greater than 4 m/s corresponding to mean transvalvular gradient estimated 43 mmHg.  Ejection fraction was estimated 65-70%. The patient was referred to Dr. Clifton James and underwent diagnostic cardiac catheterization on 03/04/2017. Catheterization confirmed the presence of severe aortic stenosis with peak to peak and mean transvalvular gradients measured 47 and 37.5 mmHg respectively. Aortic valve area was calculated 0.70 cm. The patient had mild nonobstructive coronary artery disease and normal right-sided pressures.  The patient subsequently underwent CT angiography and pulmonary function testing and has been referred for elective surgical consultation.  During the course of the patient's preoperative work up they have been evaluated comprehensively by a multidisciplinary team of specialists coordinated through the Multidisciplinary [facility] Valve Clinic in the [facility].  They have been demonstrated to suffer from symptomatic severe aortic stenosis as noted above. The patient has been counseled extensively as to the relative risks and benefits of all options for the treatment of severe aortic stenosis including long term medical therapy, conventional surgery for aortic valve replacement, and transcatheter aortic valve replacement.  All questions have been answered, and the patient provides full informed consent for the operation as described.   DETAILS OF THE OPERATIVE PROCEDURE  PREPARATION:    The patient is brought to the operating room on the above mentioned date and central monitoring was established by the anesthesia team including placement of a central venous line and radial arterial line. The patient is placed in the supine position on the operating table.  Intravenous antibiotics are administered. The patient is monitored closely throughout the procedure under conscious sedation.    Baseline transthoracic echocardiogram was performed. The patient's chest, abdomen, both groins, and both lower extremities are prepared and draped in a sterile manner. A  time out procedure is performed.   PERIPHERAL ACCESS:    Using the modified Seldinger technique, femoral arterial and venous access was obtained with placement of  6 Fr sheaths on the right side.  A pigtail diagnostic catheter was passed through the right arterial sheath under fluoroscopic guidance into the aortic root.  A temporary transvenous pacemaker catheter was passed through the right femoral venous sheath under fluoroscopic guidance into the right ventricle.  The pacemaker was tested to ensure stable lead placement and pacemaker capture. Aortic root angiography was performed in order to determine the optimal angiographic angle for valve deployment.   TRANSFEMORAL ACCESS:   Percutaneous transfemoral access and sheath placement was performed by Dr Clifton JamesMcAlhany. Please see his separate operative note for details. The patient was heparinized systemically and ACT verified > 250 seconds.    A 14 Fr transfemoral E-sheath was introduced into the left femoral artery after progressively dilating over an Amplatz superstiff wire. An AL-1 catheter was used to direct a straight-tip exchange length wire across the native aortic valve into the left ventricle. This was exchanged out for a pigtail catheter and position was confirmed in the LV apex. Simultaneous LV and Ao pressures were recorded.  The pigtail catheter was exchanged for an Amplatz Extra-stiff wire in the LV apex.  Echocardiography was utilized to confirm appropriate wire position and no sign of entanglement in the mitral subvalvular apparatus.   TRANSCATHETER HEART VALVE DEPLOYMENT:   An Edwards Sapien 3 transcatheter heart valve (size 26 mm, model #9600TFX, serial #[id number]) was prepared and crimped per manufacturer's guidelines, and the proper orientation of the valve is confirmed on the [employer] Commander delivery system. The valve was advanced through the introducer sheath using normal technique until in an appropriate position in the abdominal  aorta beyond the sheath tip. The balloon was then retracted and using the fine-tuning wheel was centered on the valve. The valve was then advanced across the aortic arch using appropriate flexion of the catheter. The valve was carefully positioned across the aortic valve annulus. The Commander catheter was retracted using normal technique. Once final position of the valve has been confirmed by angiographic assessment, the valve is deployed while temporarily holding ventilation and during rapid ventricular pacing to maintain systolic blood pressure < 50 mmHg and pulse pressure < 10 mmHg. The balloon inflation is held for >3 seconds after reaching full deployment volume. Once the balloon has fully deflated the balloon is retracted into the ascending aorta and valve function is assessed using echocardiography. There is felt to be no paravalvular leak and no central aortic insufficiency.  The patient's hemodynamic recovery following valve deployment is good.  The deployment balloon and guidewire are both removed. Final intraoperative echocardiogram demostrated acceptable post-procedural gradients, stable mitral valve function, no aortic insufficiency, and stable LV systolic function.   PROCEDURE COMPLETION:   The sheath was removed and femoral artery closure performed by Dr Clifton JamesMcAlhany. Please see his separate report for details.  Protamine was administered once femoral arterial repair was complete. The temporary pacemaker, pigtail catheters and femoral sheaths were removed with manual pressure used for hemostasis.   The patient tolerated the procedure well and is transported to the surgical intensive care in stable condition. There were no immediate intraoperative complications. All sponge instrument and needle counts are verified correct at completion of the operation.   No blood products were administered during the operation.  The patient received a total of 105 mL of intravenous contrast during the  procedure.   Purcell Nailslarence H Moroni Nester, MD 04/05/2017 12:26 PM

## 2017-04-05 NOTE — Progress Notes (Signed)
[facility] BRIEF SICU PROGRESS NOTE  Day of Surgery  S/P Procedure(s) (LRB): TRANSCATHETER AORTIC VALVE REPLACEMENT, TRANSFEMORAL (N/A) TRANSESOPHAGEAL ECHOCARDIOGRAM (TEE) (N/A)   Feels well.  Mild discomfort in groins. NSR w/ stable BP Breathing comfortably Both groins look good  Plan: Continue routine early postop  Purcell Nailslarence H Owen, MD 04/05/2017 7:34 PM

## 2017-04-05 NOTE — CV Procedure (Signed)
[facility]  TAVR OPERATIVE NOTE   Date of Procedure:  04/05/2017  Preoperative Diagnosis: Severe Aortic Stenosis   Postoperative Diagnosis: Same   Procedure:    Transcatheter Aortic Valve Replacement - Transfemoral Approach  Edwards Sapien 3 THV (size 26 mm, model # [id number], serial # [id number])   Co-Surgeons:  Valentina Gu. Roxy Manns, MD and Lauree Chandler, MD  Anesthesiologist:  Ola Spurr  Echocardiographer:  Aundra Dubin  Pre-operative Echo Findings:  Severe aortic stenosis  Normal left ventricular systolic function  Post-operative Echo Findings:  No paravalvular leak  Normal left ventricular systolic function  BRIEF CLINICAL NOTE AND INDICATIONS FOR SURGERY  76 yo female with severe AS, tobacco abuse, COPD with recent worsened dyspnea and dizziness felt to be due to her severe AS.   During the course of the patient's preoperative work up they have been evaluated comprehensively by a multidisciplinary team of specialists coordinated through the [facility] in the [facility].  They have been demonstrated to suffer from symptomatic severe aortic stenosis as noted above. The patient has been counseled extensively as to the relative risks and benefits of all options for the treatment of severe aortic stenosis including long term medical therapy, conventional surgery for aortic valve replacement, and transcatheter aortic valve replacement.  The patient has been independently evaluated by two cardiac surgeons including Dr Roxy Manns and Dr. Cyndia Bent, and they are felt to be at high risk for conventional surgical aortic valve replacement. Both surgeons indicated the patient would be a poor candidate for conventional surgery. Based upon review of all of the patient's preoperative diagnostic tests they are felt to be candidate for transcatheter aortic valve replacement using the transfemoral approach as an alternative to high risk  conventional surgery.    Following the decision to proceed with transcatheter aortic valve replacement, a discussion has been held regarding what types of management strategies would be attempted intraoperatively in the event of life-threatening complications, including whether or not the patient would be considered a candidate for the use of cardiopulmonary bypass and/or conversion to open sternotomy for attempted surgical intervention.  The patient has been advised of a variety of complications that might develop peculiar to this approach including but not limited to risks of death, stroke, paravalvular leak, aortic dissection or other major vascular complications, aortic annulus rupture, device embolization, cardiac rupture or perforation, acute myocardial infarction, arrhythmia, heart block or bradycardia requiring permanent pacemaker placement, congestive heart failure, respiratory failure, renal failure, pneumonia, infection, other late complications related to structural valve deterioration or migration, or other complications that might ultimately cause a temporary or permanent loss of functional independence or other long term morbidity.  The patient provides full informed consent for the procedure as described and all questions were answered preoperatively.    DETAILS OF THE OPERATIVE PROCEDURE  PREPARATION:   The patient is brought to the operating room on the above mentioned date and central monitoring was established by the anesthesia team including placement of a radial arterial line. The patient is placed in the supine position on the operating table.  Intravenous antibiotics are administered. Conscious sedation used by the anesthesia team.   Baseline transthoracic echocardiogram was performed. The patient's chest, abdomen, both groins, and both lower extremities are prepared and draped in a sterile manner. A time out procedure is performed.   PERIPHERAL ACCESS:   Using the modified  Seldinger technique, femoral arterial and venous access were obtained with placement of 6 Fr sheaths on  the right side.  A pigtail diagnostic catheter was passed through the femoral arterial sheath under fluoroscopic guidance into the aortic root.  A temporary transvenous pacemaker catheter was passed through the femoral venous sheath under fluoroscopic guidance into the right ventricle.  The pacemaker was tested to ensure stable lead placement and pacemaker capture. Aortic root angiography was performed in order to determine the optimal angiographic angle for valve deployment.  TRANSFEMORAL ACCESS:  A micropuncture kit was used to gain access into the right femoral artery. Position confirmed with angiography. Pre-closure with double ProGlide closure devices.     A 14 Fr transfemoral E-sheath was introduced into the left femoral artery after progressively dilating over an Amplatz superstiff wire. An AL-1 catheter was used to direct a straight-tip exchange length wire across the native aortic valve into the left ventricle. This was exchanged out for a pigtail catheter and position was confirmed in the LV apex. Simultaneous LV and Ao pressures were recorded.  The pigtail catheter was then exchanged for an Amplatz Extra-stiff wire in the LV apex.   TRANSCATHETER HEART VALVE DEPLOYMENT:  An Edwards Sapien 3 THV (size 26 mm) was prepared and crimped per manufacturer's guidelines, and the proper orientation of the valve is confirmed on the [employer] delivery system. The valve was advanced through the introducer sheath using normal technique until in an appropriate position in the abdominal aorta beyond the sheath tip. The balloon was then retracted and using the fine-tuning wheel was centered on the valve. The valve was then advanced across the aortic arch using appropriate flexion of the catheter. The valve was carefully positioned across the aortic valve annulus. The Commander catheter was retracted  using normal technique. Once final position of the valve has been confirmed by angiographic assessment, the valve is deployed while temporarily holding ventilation and during rapid ventricular pacing to maintain systolic blood pressure < 50 mmHg and pulse pressure < 10 mmHg. The balloon inflation is held for >3 seconds after reaching full deployment volume. Once the balloon has fully deflated the balloon is retracted into the ascending aorta and valve function is assessed using TEE. There is felt to be no paravalvular leak and no central aortic insufficiency.  The patient's hemodynamic recovery following valve deployment is good.  The deployment balloon and guidewire are both removed. Echo demostrated acceptable post-procedural gradients, stable mitral valve function, and no AI.   PROCEDURE COMPLETION:  The sheath was then removed and closure devices were completed. Distal abdominal aortography was performed to evaluate for any arterial injury related to the procedure. There was no evidence dissection, perforation, or other vascular injury in the abdominal aorta, iliac artery, or femoral artery.  Protamine was administered once femoral arterial repair was complete. The temporary pacemaker, pigtail catheters and femoral sheaths were removed with manual pressure used for hemostasis.   The patient tolerated the procedure well and is transported to the surgical intensive care in stable condition. There were no immediate intraoperative complications. All sponge instrument and needle counts are verified correct at completion of the operation.   No blood products were administered during the operation.  The patient received a total of 105 mL of intravenous contrast during the procedure.  Lauree Chandler MD 04/05/2017 12:44 PM

## 2017-04-06 ENCOUNTER — Other Ambulatory Visit (HOSPITAL_COMMUNITY): Payer: Medicare Other

## 2017-04-06 ENCOUNTER — Inpatient Hospital Stay (HOSPITAL_COMMUNITY): Payer: Medicare Other

## 2017-04-06 ENCOUNTER — Encounter (HOSPITAL_COMMUNITY): Payer: Self-pay | Admitting: Cardiovascular Disease

## 2017-04-06 DIAGNOSIS — I350 Nonrheumatic aortic (valve) stenosis: Secondary | ICD-10-CM

## 2017-04-06 DIAGNOSIS — Z952 Presence of prosthetic heart valve: Secondary | ICD-10-CM

## 2017-04-06 DIAGNOSIS — Z954 Presence of other heart-valve replacement: Secondary | ICD-10-CM

## 2017-04-06 LAB — BASIC METABOLIC PANEL
ANION GAP: 7 (ref 5–15)
BUN: 6 mg/dL (ref 6–20)
CHLORIDE: 104 mmol/L (ref 101–111)
CO2: 24 mmol/L (ref 22–32)
Calcium: 8 mg/dL — ABNORMAL LOW (ref 8.9–10.3)
Creatinine, Ser: 0.51 mg/dL (ref 0.44–1.00)
GFR calc Af Amer: >60 mL/min (ref >60)
GFR calc non Af Amer: >60 mL/min (ref >60)
GLUCOSE: 84 mg/dL (ref 65–99)
POTASSIUM: 3.7 mmol/L (ref 3.5–5.1)
Sodium: 135 mmol/L (ref 135–145)

## 2017-04-06 LAB — CBC
HEMATOCRIT: 31.2 % — AB (ref 36.0–46.0)
Hemoglobin: 10.3 g/dL — ABNORMAL LOW (ref 12.0–15.0)
MCH: 29.9 pg (ref 26.0–34.0)
MCHC: 33 g/dL (ref 30.0–36.0)
MCV: 90.7 fL (ref 78.0–100.0)
Platelets: 134 10*3/uL — ABNORMAL LOW (ref 150–400)
RBC: 3.44 MIL/uL — ABNORMAL LOW (ref 3.87–5.11)
RDW: 13.2 % (ref 11.5–15.5)
WBC: 6.2 10*3/uL (ref 4.0–10.5)

## 2017-04-06 LAB — MAGNESIUM: Magnesium: 1.7 mg/dL (ref 1.7–2.4)

## 2017-04-06 MED ORDER — ASPIRIN 81 MG PO TABS: 81.0000 mg | ORAL_TABLET | Freq: Every day | ORAL | Status: DC

## 2017-04-06 MED ORDER — ASPIRIN EC 81 MG PO TBEC
81.0000 mg | DELAYED_RELEASE_TABLET | Freq: Every day | ORAL | Status: DC
  Administered 2017-04-07: 81 mg via ORAL
  Filled 2017-04-06: qty 1

## 2017-04-06 MED ORDER — ASPIRIN 81 MG PO TABS: 81.0000 mg | ORAL_TABLET | ORAL | Status: DC

## 2017-04-06 MED ORDER — ASPIRIN 81 MG PO CHEW
81.0000 mg | CHEWABLE_TABLET | Freq: Every day | ORAL | Status: DC
  Administered 2017-04-06: 81 mg via ORAL
  Filled 2017-04-06: qty 1

## 2017-04-06 MED ORDER — POTASSIUM CHLORIDE CRYS ER 20 MEQ PO TBCR
40.0000 meq | EXTENDED_RELEASE_TABLET | Freq: Once | ORAL | Status: AC
Start: 2017-04-06 — End: 2017-04-06
  Administered 2017-04-06: 40 meq via ORAL
  Filled 2017-04-06: qty 2

## 2017-04-06 MED FILL — Potassium Chloride Inj 2 mEq/ML: INTRAVENOUS | Qty: 40 | Status: AC

## 2017-04-06 MED FILL — Magnesium Sulfate Inj 50%: INTRAMUSCULAR | Qty: 10 | Status: AC

## 2017-04-06 MED FILL — Insulin Regular (Human) Inj 100 Unit/ML: INTRAMUSCULAR | Qty: 100 | Status: AC

## 2017-04-06 MED FILL — Heparin Sodium (Porcine) Inj 1000 Unit/ML: INTRAMUSCULAR | Qty: 30 | Status: AC

## 2017-04-06 NOTE — Progress Notes (Signed)
CVC removed and pressure dressing applied. Right wrist arterial line removed and pt developed hematoma above puncture site. Pressure dressing applied and hematoma massaged. Wrist bruised, soft, and stable.

## 2017-04-06 NOTE — Progress Notes (Signed)
Patient received from 2H s/p TAVR, POD #1. Patient stable on arrival, alert and oriented x 4. Patient placed on tele box #6. Bilat groins assessed, both benign, moderated amount of bruising noted to left upper thigh. Right radial s/p arterial line, bruised from mid forearm down to wrist, pressure dressing intact.

## 2017-04-06 NOTE — Care Management Note (Signed)
Case Management Note Donn PieriniKristi Albirta Rhinehart RN, BSN Unit 2W-Case Manager [phone]  Patient Details  Name: April LairShelby B Hodgman MRN: [id number] Date of Birth: 04-07-1941  Subjective/Objective:   Pt admitted s/p TAVR on 04/05/17                 Action/Plan: PTA pt lived at home- anticipate return home- CM to follow  Expected Discharge Date:                  Expected Discharge Plan:  Home/Self Care  In-House Referral:     Discharge planning Services  CM Consult  Post Acute Care Choice:    Choice offered to:     DME Arranged:    DME Agency:     HH Arranged:    HH Agency:     Status of Service:  In process, will continue to follow  If discussed at Long Length of Stay Meetings, dates discussed:    Discharge Disposition:   Additional Comments:  Darrold SpanWebster, Laporscha Linehan Hall, RN 04/06/2017, 10:41 AM

## 2017-04-06 NOTE — Progress Notes (Signed)
CARDIAC REHAB PHASE I   PRE:  Rate/Rhythm: 78 SR    BP: sitting 106/60    SaO2: 92 RA  MODE:  Ambulation: 300 ft   POST:  Rate/Rhythm: 101 ST    BP: sitting 130/60     SaO2: 93 RA   Pt sts her groins are sore however she was able to get OOB without assist. Used RW to ambulate in hall, fairly steady. Rest x2 for SOB/fatigue. To recliner. Changed pts lunch order to a lighter choice as she doesn't have much appetite. Will walk with her tomorrow to determine if RW needed. Encouraged her to walk x2 more today. [id number]  Harriet MassonRandi Kristan Khayree Delellis CES, ACSM 04/06/2017 11:41 AM

## 2017-04-06 NOTE — Progress Notes (Addendum)
Progress Note  Patient Name: April Hull Date of Encounter: 04/06/2017  Primary Cardiologist: Diona Browner TAVR Cardiologist: Clifton James  Subjective   No chest pain or dyspnea. Groins mildly sore.   Inpatient Medications    Scheduled Meds: . aspirin EC  325 mg Oral Daily  . atorvastatin  10 mg Oral QHS  . clopidogrel  75 mg Oral Q breakfast  . levothyroxine  88 mcg Oral QAC breakfast  . lisinopril  5 mg Oral Daily  . pantoprazole  40 mg Oral Daily   Continuous Infusions: . albumin human    . cefUROXime (ZINACEF)  IV Stopped (04/05/17 2302)  . famotidine (PEPCID) IV Stopped (04/05/17 2302)   PRN Meds: albumin human, ondansetron (ZOFRAN) IV   Vital Signs    Vitals:   04/06/17 0300 04/06/17 0400 04/06/17 0500 04/06/17 0600  BP: (!) 147/62 (!) 130/57 125/66 137/62  Pulse: 85 72 80 74  Resp: 18 (!) 25 (!) 23 (!) 24  Temp: 98.1 F (36.7 C)     TempSrc: Oral     SpO2: 93% 94% 94% 93%  Weight:      Height:        Intake/Output Summary (Last 24 hours) at 04/06/17 0653 Last data filed at 04/06/17 0500  Gross per 24 hour  Intake          1793.75 ml  Output             1500 ml  Net           293.75 ml   Filed Weights   04/05/17 0821 04/05/17 1400  Weight: 108 lb 8 oz (49.2 kg) 114 lb 10.2 oz (52 kg)    Telemetry    Sinus - Personally Reviewed  ECG      Physical Exam   General: Well developed, well nourished, NAD  HEENT: OP clear, mucus membranes moist  SKIN: warm, dry. No rashes. Neuro: No focal deficits  Musculoskeletal: Muscle strength 5/5 all ext  Psychiatric: Mood and affect normal  Neck: No JVD, no carotid bruits, no thyromegaly, no lymphadenopathy.  Lungs:Clear bilaterally, no wheezes, rhonci, crackles Cardiovascular: Regular rate and rhythm. No murmurs, gallops or rubs. Abdomen:Soft. Bowel sounds present. Non-tender.  Extremities: No lower extremity edema. Bilateral groin access sites without hematoma   Labs    Chemistry Recent Labs Lab  03/31/17 1516  04/05/17 1202 04/05/17 1227 04/05/17 1336 04/06/17 0428  NA 135  < > 138 138 140 135  K 3.9  < > 4.1 4.0 4.0 3.7  CL 103  < > 101 101  --  104  CO2 24  --   --   --   --  24  GLUCOSE 92  < > 117* 122* 96 84  BUN 8  < > 12 10  --  6  CREATININE 0.62  < > 0.40* 0.50  --  0.51  CALCIUM 9.5  --   --   --   --  8.0*  PROT 6.6  --   --   --   --   --   ALBUMIN 4.0  --   --   --   --   --   AST 21  --   --   --   --   --   ALT 14  --   --   --   --   --   ALKPHOS 56  --   --   --   --   --  BILITOT 0.7  --   --   --   --   --   GFRNONAA >60  --   --   --   --  >60  GFRAA >60  --   --   --   --  >60  ANIONGAP 8  --   --   --   --  7  < > = values in this interval not displayed.   Hematology Recent Labs Lab 03/31/17 1516  04/05/17 1335 04/05/17 1336 04/06/17 0428  WBC 7.3  --  6.4  --  6.2  RBC 4.43  --  3.50*  --  3.44*  HGB 13.1  < > 10.5* 9.9* 10.3*  HCT 40.3  < > 32.1* 29.0* 31.2*  MCV 91.0  --  91.7  --  90.7  MCH 29.6  --  30.0  --  29.9  MCHC 32.5  --  32.7  --  33.0  RDW 13.2  --  13.3  --  13.2  PLT 242  --  173  --  134*  < > = values in this interval not displayed.    Radiology    Dg Chest Port 1 View  Result Date: 04/05/2017 CLINICAL DATA:  76 year old female status post TAVR today. EXAM: PORTABLE CHEST 1 VIEW COMPARISON:  03/31/2017 and earlier. FINDINGS: Portable AP supine view at 1405 hours. Right IJ approach central line tip projects just below the carina. Prosthetic aortic valve projects over the cardiac silhouette. Mediastinal contours remain stable. Large lung volumes. Increased pulmonary vascularity but no overt edema. No pneumothorax, pleural effusion or confluent pulmonary opacity identified. Negative visible bowel gas pattern. IMPRESSION: 1. Right IJ approach central line tip projects at the lower SVC level. 2. Increased pulmonary vascularity but no overt edema or other acute cardiopulmonary abnormality identified. Electronically Signed    By: Odessa FlemingH  Hall M.D.   On: 04/05/2017 14:25   Chest x-ray images pending this am  Cardiac Studies     Patient Profile     76 y.o. female with history of COPD, severe AS who underwent TAVR from the left transfemoral approach 04/05/17.   Assessment & Plan    1. Severe aortic valve stenosis: She is now one day post TAVR from the left TF approach. She is doing well this am. Will start Plavix today. Continue ASA. She is in sinus. No arrhythmias overnight. Echo later today to assess valve. D/C arterial line and central line. Will transfer to telemetry unit.   Signed, Verne Carrowhristopher Tykera Skates, MD  04/06/2017, 6:53 AM

## 2017-04-06 NOTE — Progress Notes (Signed)
      [address]       [place] [id number]             [phone]        CARDIOTHORACIC SURGERY PROGRESS NOTE   R1 Day Post-Op Procedure(s) (LRB): TRANSCATHETER AORTIC VALVE REPLACEMENT, TRANSFEMORAL (N/A) TRANSESOPHAGEAL ECHOCARDIOGRAM (TEE) (N/A)  Subjective: Looks great.  Only complaint is no sleep overnight  Objective: Vital signs: BP Readings from Last 1 Encounters:  04/06/17 138/63   Pulse Readings from Last 1 Encounters:  04/06/17 76   Resp Readings from Last 1 Encounters:  04/06/17 (!) 24   Temp Readings from Last 1 Encounters:  04/06/17 98.1 F (36.7 C) (Oral)    Hemodynamics:    Physical Exam:  Rhythm:   sinus  Breath sounds: clear  Heart sounds:  RRR w/out murmur  Incisions:  Both groins okay  Abdomen:  Soft, non-distended, non-tender  Extremities:  Warm, well-perfused    Intake/Output from previous day: 06/19 0701 - 06/20 0700 In: 1793.8 [P.O.:60; I.V.:1433.8; IV Piggyback:300] Out: 1500 [Urine:1500] Intake/Output this shift: Total I/O In: -  Out: 50 [Urine:50]  Lab Results:  CBC: Recent Labs  04/05/17 1335 04/05/17 1336 04/06/17 0428  WBC 6.4  --  6.2  HGB 10.5* 9.9* 10.3*  HCT 32.1* 29.0* 31.2*  PLT 173  --  134*    BMET:  Recent Labs  04/05/17 1227 04/05/17 1336 04/06/17 0428  NA 138 140 135  K 4.0 4.0 3.7  CL 101  --  104  CO2  --   --  24  GLUCOSE 122* 96 84  BUN 10  --  6  CREATININE 0.50  --  0.51  CALCIUM  --   --  8.0*     PT/INR:   Recent Labs  04/05/17 1335  LABPROT 14.8  INR 1.16    CBG (last 3)  No results for input(s): GLUCAP in the last 72 hours.  ABG    Component Value Date/Time   PHART 7.424 03/31/2017 1516   PCO2ART 39.7 03/31/2017 1516   PO2ART 102 03/31/2017 1516   HCO3 25.6 03/31/2017 1516   TCO2 27 04/05/2017 1227   O2SAT 97.8 03/31/2017 1516    CXR: PORTABLE CHEST 1 VIEW  COMPARISON:  Portable chest x-ray of June 05, 2017  FINDINGS: The lungs are  well-expanded and clear. There is no infiltrate or pleural effusion. The heart and pulmonary vascularity are normal. The mediastinum is normal in width. The aortic valve cage is in stable position radiographically. The right internal jugular venous catheter tip projects over the midportion of the SVC. There is calcification in the wall of the aortic arch.  IMPRESSION: No postprocedure complication following transcatheter aortic valve replacement.   Electronically Signed   By: David  SwazilandJordan M.D.   On: 04/06/2017 07:36   EKG: NSR w/out acute ischemic changes    Assessment/Plan: S/P Procedure(s) (LRB): TRANSCATHETER AORTIC VALVE REPLACEMENT, TRANSFEMORAL (N/A) TRANSESOPHAGEAL ECHOCARDIOGRAM (TEE) (N/A)  Doing well POD1 TAVR Agree w/ plans outlined by Dr Clifton JamesMcAlhany Anticipate d/c home tomorrow   I spent in excess of 15 minutes during the conduct of this hospital encounter and >50% of this time involved direct face-to-face encounter with the patient for counseling and/or coordination of their care.    Purcell Nailslarence H Owen, MD 04/06/2017 9:13 AM

## 2017-04-07 ENCOUNTER — Inpatient Hospital Stay (HOSPITAL_COMMUNITY): Payer: Medicare Other

## 2017-04-07 DIAGNOSIS — I350 Nonrheumatic aortic (valve) stenosis: Secondary | ICD-10-CM

## 2017-04-07 LAB — BASIC METABOLIC PANEL
Anion gap: 8 (ref 5–15)
BUN: 5 mg/dL — AB (ref 6–20)
CALCIUM: 8.2 mg/dL — AB (ref 8.9–10.3)
CO2: 23 mmol/L (ref 22–32)
CREATININE: 0.52 mg/dL (ref 0.44–1.00)
Chloride: 103 mmol/L (ref 101–111)
GFR calc non Af Amer: >60 mL/min (ref >60)
GLUCOSE: 90 mg/dL (ref 65–99)
Potassium: 3.7 mmol/L (ref 3.5–5.1)
Sodium: 134 mmol/L — ABNORMAL LOW (ref 135–145)

## 2017-04-07 LAB — ECHOCARDIOGRAM COMPLETE
Height: 63 in
Weight: 1964.8 oz

## 2017-04-07 LAB — CBC
HCT: 30 % — ABNORMAL LOW (ref 36.0–46.0)
HEMOGLOBIN: 9.8 g/dL — AB (ref 12.0–15.0)
MCH: 29.4 pg (ref 26.0–34.0)
MCHC: 32.7 g/dL (ref 30.0–36.0)
MCV: 90.1 fL (ref 78.0–100.0)
Platelets: 127 10*3/uL — ABNORMAL LOW (ref 150–400)
RBC: 3.33 MIL/uL — ABNORMAL LOW (ref 3.87–5.11)
RDW: 13.2 % (ref 11.5–15.5)
WBC: 6.5 10*3/uL (ref 4.0–10.5)

## 2017-04-07 MED ORDER — CLOPIDOGREL BISULFATE 75 MG PO TABS: 75.0000 mg | ORAL_TABLET | Freq: Every day | ORAL | 0 refills | Status: DC

## 2017-04-07 NOTE — Progress Notes (Signed)
  Echocardiogram 2D Echocardiogram has been performed.  April SavoyCasey N Hull April Hull 04/07/2017, 8:58 AM

## 2017-04-07 NOTE — Care Management Note (Signed)
Case Management Note Donn PieriniKristi Massimiliano Rohleder RN, BSN Unit 2W-Case Manager [phone]  Patient Details  Name: April LairShelby B Hull MRN: [id number] Date of Birth: 24-Jun-1941  Subjective/Objective:   Pt admitted s/p TAVR on 04/05/17                 Action/Plan: PTA pt lived at home- anticipate return home- CM to follow  Expected Discharge Date:  04/07/17               Expected Discharge Plan:  Home/Self Care  In-House Referral:     Discharge planning Services  CM Consult  Post Acute Care Choice:    Choice offered to:     DME Arranged:    DME Agency:     HH Arranged:    HH Agency:     Status of Service:  Completed, signed off  If discussed at MicrosoftLong Length of Stay Meetings, dates discussed:    Discharge Disposition: home/self care   Additional Comments:  04/07/17- 1120- Oliver Heitzenrater RN, CM- pt for d/c home today- daughter to stay with pt to assist- no CM needs noted for discharge.   Darrold SpanWebster, Ava Deguire Hall, RN 04/07/2017, 11:19 AM

## 2017-04-07 NOTE — Progress Notes (Signed)
CARDIAC REHAB PHASE I   PRE:  Rate/Rhythm: 73 SR    BP: sitting 140/60    SaO2: 93 RA  MODE:  Ambulation: 440 ft   POST:  Rate/Rhythm: 90 SR    BP: sitting 150/70     SaO2: 96 RA  Tolerated well, no c/o. Did not use RW, sways a little but ok to be independent. Discussed ex gl, smoking cessation, and CRPII. [name]. Pt is determined to quit smoking.  [phone]   April MassonRandi Kristan Cash Hull CES, ACSM 04/07/2017 10:09 AM

## 2017-04-07 NOTE — Discharge Summary (Signed)
Discharge Summary    Patient ID: April Hull,  MRN: [id number], DOB/AGE: 78-Jan-1942 78 y.o.  Admit date: 04/05/2017 Discharge date: 04/07/2017  Primary Care Provider: [name] Primary Cardiologist: Diona Browner  Discharge Diagnoses    Principal Problem:   S/P TAVR (transcatheter aortic valve replacement) Active Problems:   Severe aortic valve stenosis   Allergies Allergies  Allergen Reactions  . Bactrim [Sulfamethoxazole-Trimethoprim] Hives, Itching and Other (See Comments)    Irregular heart beat  . Codeine Palpitations    Dizziness, sweats     Diagnostic Studies/Procedures    TAVR: 04/05/17  Procedure:        Transcatheter Aortic Valve Replacement - Percutaneous Left Transfemoral Approach             Edwards Sapien 3 THV (size 26 mm, model # 9600TFX, serial # [id number])              Co-Surgeons:                        Salvatore Decent. Cornelius Moras, MD and Verne Carrow, MD  Anesthesiologist:                  Gaynelle Adu, MD  Echocardiographer:              Marca Ancona, MD  Pre-operative Echo Findings: ? Severe aortic stenosis ? Normal LV systolic function  Post-operative Echo Findings: ? Trivial paravalvular leak ? Normal left ventricular systolic function  TTE: 04/07/17  Study Conclusions  - Left ventricle: The cavity size was normal. There was moderate   concentric hypertrophy. Systolic function was normal. The   estimated ejection fraction was in the range of 60% to 65%. Wall   motion was normal; there were no regional wall motion   abnormalities. There was an increased relative contribution of   atrial contraction to ventricular filling. Doppler parameters are   consistent with abnormal left ventricular relaxation (grade 1   diastolic dysfunction). - Aortic valve: S/P TAVR with an AV bioprosthesis which is well   seated in the AV position. There is no perivalvular AI. Mean AV   gradient is and peak AV gradient is . The  AVA is   calculated at 3.52cm2. Valve area (VTI): 3.52 cm^2. Valve area   (Vmax): 2.7 cm^2. Valve area (Vmean): 2.87 cm^2. - Mitral valve: Minimal focal calcification of the anterior leaflet   (medial segment(s)), with mild involvement of chords. There was   mild regurgitation. _____________   History of Present Illness     76 year old female with history of aortic stenosis, hypertension, long-standing tobacco abuse with COPD, hypothyroidism, hyperlipidemia, and osteoarthritis who was seen by Dr. McAlhany/Dr. Cornelius Moras for surgical consultation to discuss treatment options for management of severe symptomatic aortic stenosis. The patient stated that she had known about the presence of a heart murmur for many years. For the last several years she has been followed by Dr. Diona Browner with known history of aortic stenosis. Previous echocardiograms have documented the presence of moderate aortic stenosis with normal left ventricular function, and clinically the patient has done well until recently. Over the past several months the patient had developed progressive symptoms of exertional shortness of breath, palpitations, lower extremity edema, and occasional dizzy spells. Follow-up transthoracic echocardiogram performed 02/02/2017 revealed significant progression of disease with severe aortic stenosis and normal left ventricular systolic function. Peak velocity across the aortic valve measured greater than 4 m/s corresponding to mean transvalvular  gradient estimated 43 mmHg. Ejection fraction was estimated 65-70%. The patient was referred to Dr. Clifton JamesMcAlhany and underwent diagnostic cardiac catheterization on 03/04/2017. Catheterization confirmed the presence of severe aortic stenosis with peak to peak and mean transvalvular gradients measured 47 and 37.5 mmHg respectively. Aortic valve area was calculated 0.70 cm. The patient had mild nonobstructive coronary artery disease and normal right-sided pressures. The patient  subsequently underwent CT angiography and pulmonary function testing and has been referred for elective surgical consultation. She was seen by Dr. Cornelius Moraswen and felt to be a candidate for TAVR.  Hospital Course     Consultants: CVTS  She underwent successful TAVR with Edwards Sapien 3 THV (size 26mm) with Dr. Clifton JamesMcAlhany and Dr. Cornelius Moraswen. Felt well post op, with stable labs. Mild post op anemia and thrombocytopenia. She was transferred from ICU to telemetry on 04/06/17. Worked well with cardiac rehab. Follow up echo showed well seated valve with no perivalvular leak.   She was seen by Dr. Clifton JamesMcAlhany and determined stable for discharge home. Follow up has been arranged in the [place] office. Medications are listed below. Will need follow up with echo in one month with Dr. Clifton JamesMcAlhany.  _____________  Discharge Vitals Blood pressure (!) 123/53, pulse 93, temperature 98.3 F (36.8 C), temperature source Oral, resp. rate 17, height 5\' 3"  (1.6 m), weight 122 lb 12.8 oz (55.7 kg), SpO2 94 %.  Filed Weights   04/05/17 1400 04/06/17 2018 04/07/17 0446  Weight: 114 lb 10.2 oz (52 kg) 122 lb 9.6 oz (55.6 kg) 122 lb 12.8 oz (55.7 kg)    Labs & Radiologic Studies    CBC  Recent Labs  04/06/17 0428 04/07/17 0048  WBC 6.2 6.5  HGB 10.3* 9.8*  HCT 31.2* 30.0*  MCV 90.7 90.1  PLT 134* 127*   Basic Metabolic Panel  Recent Labs  04/06/17 0428 04/07/17 0048  NA 135 134*  K 3.7 3.7  CL 104 103  CO2 24 23  GLUCOSE 84 90  BUN 6 5*  CREATININE 0.51 0.52  CALCIUM 8.0* 8.2*  MG 1.7  --    Liver Function Tests No results for input(s): AST, ALT, ALKPHOS, BILITOT, PROT, ALBUMIN in the last 72 hours. No results for input(s): LIPASE, AMYLASE in the last 72 hours. Cardiac Enzymes No results for input(s): CKTOTAL, CKMB, CKMBINDEX, TROPONINI in the last 72 hours. BNP Invalid input(s): POCBNP D-Dimer No results for input(s): DDIMER in the last 72 hours. Hemoglobin A1C No results for input(s): HGBA1C in the  last 72 hours. Fasting Lipid Panel No results for input(s): CHOL, HDL, LDLCALC, TRIG, CHOLHDL, LDLDIRECT in the last 72 hours. Thyroid Function Tests No results for input(s): TSH, T4TOTAL, T3FREE, THYROIDAB in the last 72 hours.  Invalid input(s): FREET3 _____________   Dg Chest Port 1 View  Result Date: 04/06/2017 CLINICAL DATA:  Status post transcatheter aortic valve replacement. EXAM: PORTABLE CHEST 1 VIEW COMPARISON:  Portable chest x-ray of June 05, 2017 FINDINGS: The lungs are well-expanded and clear. There is no infiltrate or pleural effusion. The heart and pulmonary vascularity are normal. The mediastinum is normal in width. The aortic valve cage is in stable position radiographically. The right internal jugular venous catheter tip projects over the midportion of the SVC. There is calcification in the wall of the aortic arch. IMPRESSION: No postprocedure complication following transcatheter aortic valve replacement. Electronically Signed   By: David  SwazilandJordan M.D.   On: 04/06/2017 07:36   Dg Chest Port 1 View  Result Date: 04/05/2017  CLINICAL DATA:  76 year old female status post TAVR today. EXAM: PORTABLE CHEST 1 VIEW COMPARISON:  03/31/2017 and earlier. FINDINGS: Portable AP supine view at 1405 hours. Right IJ approach central line tip projects just below the carina. Prosthetic aortic valve projects over the cardiac silhouette. Mediastinal contours remain stable. Large lung volumes. Increased pulmonary vascularity but no overt edema. No pneumothorax, pleural effusion or confluent pulmonary opacity identified. Negative visible bowel gas pattern. IMPRESSION: 1. Right IJ approach central line tip projects at the lower SVC level. 2. Increased pulmonary vascularity but no overt edema or other acute cardiopulmonary abnormality identified. Electronically Signed   By: Odessa Fleming M.D.   On: 04/05/2017 14:25    Disposition   Pt is being discharged home today in good condition.  Follow-up Plans &  Appointments    Follow-up Information    Jodelle Gross, NP Follow up on 04/14/2017.   Specialties:  Nurse Practitioner, Radiology, Cardiology Why:  at 2pm for your follow up appt.  Contact information: [address] [place] [address] [phone]          Discharge Instructions    Amb Referral to Cardiac Rehabilitation    Complete by:  As directed    Diagnosis:  Valve Replacement   Valve:  Aortic Comment - TAVR   Call MD for:  redness, tenderness, or signs of infection (pain, swelling, redness, odor or green/yellow discharge around incision site)    Complete by:  As directed    Diet - low sodium heart healthy    Complete by:  As directed    Discharge instructions    Complete by:  As directed    ACTIVITY AND EXERCISE . Daily activity and exercise are an important part of your recovery. People recover at different rates depending on their general health and type of valve procedure. . Most people require six to 10 weeks to feel recovered. . No lifting, pushing, pulling more than 10 pounds (examples to avoid: groceries, vacuuming, gardening, golfing):  - For one week with a procedure through the groin.  - For six weeks for procedures through the chest wall.  - For three months for procedures through the breast-bone. NOTE: You will typically see one of our providers 7-10 days after your procedure to discuss WHEN TO RESUME the above activities.    DRIVING . Do not drive for four weeks after the date of your procedure. . If you have been told by your doctor in the past that you may not drive, you must talk with him/her before you begin driving again. . When you resume driving, you must have someone with you.   DRESSING . Groin site: you may leave the clear dressing over the site for up to one week or until it falls off.   HYGIENE . If you had a femoral (leg) procedure, you may take a shower when you return home. After the shower, pat the site dry. Do NOT use powder,  oils or lotions in your groin area until the site has completely healed. . If you had a chest procedure, you may shower when you return home unless specifically instructed not to by your discharging practitioner.  - DO NOT scrub incision; pat dry with a towel  - DO NOT apply any lotions, oils, powders to the incision  - No tub baths / swimming for at least six weeks. . If you notice any fevers, chills, increased pain, swelling, bleeding or pus, please contact your doctor.  ADDITIONAL INFORMATION .  If you are going to have an upcoming dental procedure, please contact our office as you may require antibiotics ahead of time to prevent infection on your heart valve.   Increase activity slowly    Complete by:  As directed       Discharge Medications   Current Discharge Medication List    START taking these medications   Details  clopidogrel (PLAVIX) 75 MG tablet Take 1 tablet (75 mg total) by mouth daily with breakfast. Qty: 90 tablet, Refills: 0      CONTINUE these medications which have NOT CHANGED   Details  aspirin 81 MG tablet Take 81 mg by mouth every other day.     atorvastatin (LIPITOR) 10 MG tablet Take 10 mg by mouth at bedtime.     Calcium Carbonate-Vitamin D (CALCIUM 500 + D PO) Take 1 tablet by mouth every other day.    levothyroxine (SYNTHROID, LEVOTHROID) 88 MCG tablet Take 88 mcg by mouth daily before breakfast.    lisinopril (PRINIVIL,ZESTRIL) 5 MG tablet Take 5 mg by mouth daily.    Multiple Vitamin (MULTIVITAMIN WITH MINERALS) TABS tablet Take 1 tablet by mouth every other day.    pantoprazole (PROTONIX) 40 MG tablet Take 40 mg by mouth daily.          Outstanding Labs/Studies   Follow up echo in one month.   Duration of Discharge Encounter   Greater than 30 minutes including physician time.  Signed, Laverda Page NP-C 04/07/2017, 10:46 AM

## 2017-04-07 NOTE — Progress Notes (Addendum)
[address]       [place] [id number]      Jacky Kindle       [phone]      2 Days Post-Op Procedure(s) (LRB): TRANSCATHETER AORTIC VALVE REPLACEMENT, TRANSFEMORAL (N/A) TRANSESOPHAGEAL ECHOCARDIOGRAM (TEE) (N/A) Subjective: Feels well, minor groin discomfort with ambulation  Objective: Vital signs in last 24 hours: Temp:  [98.3 F (36.8 C)-98.9 F (37.2 C)] 98.3 F (36.8 C) (06/21 0446) Pulse Rate:  [75-93] 93 (06/21 0446) Cardiac Rhythm: Normal sinus rhythm (06/21 0700) Resp:  [17-24] 17 (06/21 0446) BP: (123-157)/(52-74) 123/53 (06/21 0446) SpO2:  [94 %-95 %] 94 % (06/21 0446) Weight:  [122 lb 9.6 oz (55.6 kg)-122 lb 12.8 oz (55.7 kg)] 122 lb 12.8 oz (55.7 kg) (06/21 0446)  Hemodynamic parameters for last 24 hours:    Intake/Output from previous day: 06/20 0701 - 06/21 0700 In: 340 [P.O.:340] Out: 50 [Urine:50] Intake/Output this shift: No intake/output data recorded.  General appearance: alert, cooperative and no distress Heart: regular rate and rhythm and no murmur Lungs: clear to auscultation bilaterally Abdomen: benign Extremities: no edema Wound: groins with minor  echymosis  Lab Results:  Recent Labs  04/06/17 0428 04/07/17 0048  WBC 6.2 6.5  HGB 10.3* 9.8*  HCT 31.2* 30.0*  PLT 134* 127*   BMET:  Recent Labs  04/06/17 0428 04/07/17 0048  NA 135 134*  K 3.7 3.7  CL 104 103  CO2 24 23  GLUCOSE 84 90  BUN 6 5*  CREATININE 0.51 0.52  CALCIUM 8.0* 8.2*    PT/INR:  Recent Labs  04/05/17 1335  LABPROT 14.8  INR 1.16   ABG    Component Value Date/Time   PHART 7.424 03/31/2017 1516   HCO3 25.6 03/31/2017 1516   TCO2 27 04/05/2017 1227   O2SAT 97.8 03/31/2017 1516   CBG (last 3)  No results for input(s): GLUCAP in the last 72 hours.  Meds Scheduled Meds: . aspirin EC  81 mg Oral Daily  . atorvastatin  10 mg Oral QHS  . clopidogrel  75 mg Oral Q breakfast  . levothyroxine  88 mcg Oral QAC breakfast  . lisinopril   5 mg Oral Daily  . pantoprazole  40 mg Oral Daily   Continuous Infusions: . cefUROXime (ZINACEF)  IV Stopped (04/06/17 2311)   PRN Meds:.ondansetron (ZOFRAN) IV  Xrays Dg Chest Port 1 View  Result Date: 04/06/2017 CLINICAL DATA:  Status post transcatheter aortic valve replacement. EXAM: PORTABLE CHEST 1 VIEW COMPARISON:  Portable chest x-ray of June 05, 2017 FINDINGS: The lungs are well-expanded and clear. There is no infiltrate or pleural effusion. The heart and pulmonary vascularity are normal. The mediastinum is normal in width. The aortic valve cage is in stable position radiographically. The right internal jugular venous catheter tip projects over the midportion of the SVC. There is calcification in the wall of the aortic arch. IMPRESSION: No postprocedure complication following transcatheter aortic valve replacement. Electronically Signed   By: David  SwazilandJordan M.D.   On: 04/06/2017 07:36   Dg Chest Port 1 View  Result Date: 04/05/2017 CLINICAL DATA:  76 year old female status post TAVR today. EXAM: PORTABLE CHEST 1 VIEW COMPARISON:  03/31/2017 and earlier. FINDINGS: Portable AP supine view at 1405 hours. Right IJ approach central line tip projects just below the carina. Prosthetic aortic valve projects over the cardiac silhouette. Mediastinal contours remain stable. Large lung volumes. Increased pulmonary vascularity but no overt edema. No pneumothorax, pleural effusion  or confluent pulmonary opacity identified. Negative visible bowel gas pattern. IMPRESSION: 1. Right IJ approach central line tip projects at the lower SVC level. 2. Increased pulmonary vascularity but no overt edema or other acute cardiopulmonary abnormality identified. Electronically Signed   By: Odessa Fleming M.D.   On: 04/05/2017 14:25    Assessment/Plan: S/P Procedure(s) (LRB): TRANSCATHETER AORTIC VALVE REPLACEMENT, TRANSFEMORAL (N/A) TRANSESOPHAGEAL ECHOCARDIOGRAM (TEE) (N/A)  1 doing well 2 echo pending 3 home later  today per cardiology    LOS: 2 days    GOLD,WAYNE E 04/07/2017   I have seen and examined the patient and agree with the assessment and plan as outlined.  Looks good.  D/C home today once ECHO completed.  Purcell Nails, MD 04/07/2017 8:39 AM

## 2017-04-07 NOTE — Progress Notes (Signed)
Progress Note  Patient Name: April Hull Date of Encounter: 04/07/2017  Primary Cardiologist: Diona BrownerMcDowell TAVR Cardiologist: Clifton JamesMcAlhany  Subjective   No CP or dyspnea. Ambulating well.   Inpatient Medications    Scheduled Meds: . aspirin EC  81 mg Oral Daily  . atorvastatin  10 mg Oral QHS  . clopidogrel  75 mg Oral Q breakfast  . levothyroxine  88 mcg Oral QAC breakfast  . lisinopril  5 mg Oral Daily  . pantoprazole  40 mg Oral Daily   Continuous Infusions: . cefUROXime (ZINACEF)  IV Stopped (04/06/17 2311)   PRN Meds: ondansetron (ZOFRAN) IV   Vital Signs    Vitals:   04/06/17 0900 04/06/17 1019 04/06/17 2018 04/07/17 0446  BP: 138/63 (!) 134/52 (!) 145/65 (!) 123/53  Pulse: 76 75 84 93  Resp: (!) 24  19 17   Temp:  98.6 F (37 C) 98.9 F (37.2 C) 98.3 F (36.8 C)  TempSrc:  Oral Axillary Oral  SpO2: 94% 94% 95% 94%  Weight:   122 lb 9.6 oz (55.6 kg) 122 lb 12.8 oz (55.7 kg)  Height:        Intake/Output Summary (Last 24 hours) at 04/07/17 [id number] Last data filed at 04/06/17 2211  Gross per 24 hour  Intake              340 ml  Output               50 ml  Net              290 ml   Filed Weights   04/05/17 1400 04/06/17 2018 04/07/17 0446  Weight: 114 lb 10.2 oz (52 kg) 122 lb 9.6 oz (55.6 kg) 122 lb 12.8 oz (55.7 kg)    Telemetry    Sinus tach, PACs - Personally Reviewed  ECG      Physical Exam    General: Well developed, well nourished, NAD  HEENT: OP clear, mucus membranes moist  SKIN: warm, dry. No rashes. Neuro: No focal deficits  Musculoskeletal: Muscle strength 5/5 all ext  Psychiatric: Mood and affect normal  Neck: No JVD, no carotid bruits, no thyromegaly, no lymphadenopathy.  Lungs:Clear bilaterally, no wheezes, rhonci, crackles Cardiovascular: Regular rate and rhythm. No murmurs, gallops or rubs. Abdomen:Soft. Bowel sounds present. Non-tender.  Extremities: No lower extremity edema. Both groins with mild bruising but no hematoma,  soft.    Labs    Chemistry Recent Labs Lab 03/31/17 1516  04/05/17 1227 04/05/17 1336 04/06/17 0428 04/07/17 0048  NA 135  < > 138 140 135 134*  K 3.9  < > 4.0 4.0 3.7 3.7  CL 103  < > 101  --  104 103  CO2 24  --   --   --  24 23  GLUCOSE 92  < > 122* 96 84 90  BUN 8  < > 10  --  6 5*  CREATININE 0.62  < > 0.50  --  0.51 0.52  CALCIUM 9.5  --   --   --  8.0* 8.2*  PROT 6.6  --   --   --   --   --   ALBUMIN 4.0  --   --   --   --   --   AST 21  --   --   --   --   --   ALT 14  --   --   --   --   --  ALKPHOS 56  --   --   --   --   --   BILITOT 0.7  --   --   --   --   --   GFRNONAA >60  --   --   --  >60 >60  GFRAA >60  --   --   --  >60 >60  ANIONGAP 8  --   --   --  7 8  < > = values in this interval not displayed.   Hematology  Recent Labs Lab 04/05/17 1335 04/05/17 1336 04/06/17 0428 04/07/17 0048  WBC 6.4  --  6.2 6.5  RBC 3.50*  --  3.44* 3.33*  HGB 10.5* 9.9* 10.3* 9.8*  HCT 32.1* 29.0* 31.2* 30.0*  MCV 91.7  --  90.7 90.1  MCH 30.0  --  29.9 29.4  MCHC 32.7  --  33.0 32.7  RDW 13.3  --  13.2 13.2  PLT 173  --  134* 127*      Radiology    Dg Chest Port 1 View  Result Date: 04/06/2017 CLINICAL DATA:  Status post transcatheter aortic valve replacement. EXAM: PORTABLE CHEST 1 VIEW COMPARISON:  Portable chest x-ray of June 05, 2017 FINDINGS: The lungs are well-expanded and clear. There is no infiltrate or pleural effusion. The heart and pulmonary vascularity are normal. The mediastinum is normal in width. The aortic valve cage is in stable position radiographically. The right internal jugular venous catheter tip projects over the midportion of the SVC. There is calcification in the wall of the aortic arch. IMPRESSION: No postprocedure complication following transcatheter aortic valve replacement. Electronically Signed   By: David  Swaziland M.D.   On: 04/06/2017 07:36   Dg Chest Port 1 View  Result Date: 04/05/2017 CLINICAL DATA:  76 year old female  status post TAVR today. EXAM: PORTABLE CHEST 1 VIEW COMPARISON:  03/31/2017 and earlier. FINDINGS: Portable AP supine view at 1405 hours. Right IJ approach central line tip projects just below the carina. Prosthetic aortic valve projects over the cardiac silhouette. Mediastinal contours remain stable. Large lung volumes. Increased pulmonary vascularity but no overt edema. No pneumothorax, pleural effusion or confluent pulmonary opacity identified. Negative visible bowel gas pattern. IMPRESSION: 1. Right IJ approach central line tip projects at the lower SVC level. 2. Increased pulmonary vascularity but no overt edema or other acute cardiopulmonary abnormality identified. Electronically Signed   By: Odessa Fleming M.D.   On: 04/05/2017 14:25    Cardiac Studies     Patient Profile     76 y.o. female with history of COPD, severe AS who underwent TAVR from the left transfemoral approach 04/05/17.   Assessment & Plan    1. Severe aortic valve stenosis:She is now two days post TAVR from the left TF approach. She is clinically stable and doing well. Will continue ASA and Plavix. Echo is pending today. If echo is ok, can d/c home today and plan one week f/u with our office APP and then 1 month f/u with me with echo day of visit with me.    Signed, Verne Carrow, MD  04/07/2017, 6:27 AM

## 2017-04-07 NOTE — Progress Notes (Signed)
Patient went off unit for a 2D Echocardiogram. Will assess and medicate patient when she returns to unit.

## 2017-04-08 ENCOUNTER — Telehealth: Payer: Self-pay

## 2017-04-08 ENCOUNTER — Telehealth: Payer: Self-pay | Admitting: Cardiovascular Disease

## 2017-04-08 NOTE — Telephone Encounter (Signed)
[facility].  Patient understands to follow up with provider K lawrence NP on 04/14/17 at 2 pm at [facility]. Patient understands discharge instructions?  Patient understands medications and regiment?  Patient understands to bring all medications to this visit?   Left message with daughter, Bjorn LoserRhonda giving date and time of fu apt.I asked her to call if there were any questions regarding discharge instructions

## 2017-04-08 NOTE — Telephone Encounter (Signed)
-----   Message from Vallarie MareHannah E Peters sent at 04/07/2017 10:25 AM EDT ----- TCM w/ Lovey NewcomerKL on 04/14/17

## 2017-04-08 NOTE — Telephone Encounter (Signed)
Daughter called confirmed apt, mother has hives today ,they believe is form plavix.She has already called Dr.McAlheny's office

## 2017-04-08 NOTE — Telephone Encounter (Signed)
Pts Daughter is calling to inform Dr Clifton JamesMcAlhany that she thinks the pt is having an allergic reaction to new med, Plavix.  Daughter states that the pt was discharged from the hospital yesterday for S/P TAVR. Daughter states that the pt was given her 1st dose of Plavix mid afternoon yesterday, right before discharge.  Daughter states that later on in the evening, the pt started complaining of itching on her chest area, near the electrode sites.  Daughter assumed the rash and itching were coming from area where the electrodes were placed   Daughter states the pt went to bed and slept well, but woke up this morning and  noticed the pt has hives on her trunk, back area, head, and upper extremities.  Daughter reports that she is having no airway issues, only hives, and itching.  Daughter reports that they have not given her Plavix this morning, for she feels strongly that Plavix is causing her reaction.  Daughter states that Plavix was the only new med introduced into the pts current regimen.  Daughter would like for Dr Clifton JamesMcAlhany to advise on this.  Informed the daughter that Dr Clifton JamesMcAlhany is in the cath lab right now, but I will immediately page him to request a call back to the office.  Informed the pts Daughter that once he gets back to me and provides recommendations, I will promptly follow-up her thereafter.  Advised the daughter that in the meantime, if the patients allergic reaction worsens, and she shows signs of difficulty breathing, swelling to the tongue, lips, or facial area, then she should immediately call 911.  Daughter verbalized understanding and agrees with this plan.

## 2017-04-08 NOTE — Telephone Encounter (Signed)
New message      Pt released from hosp yesterday.  Pt has hives on stomach, back and head.  Could it be the plavix?

## 2017-04-08 NOTE — Telephone Encounter (Signed)
Spoke with Dr Clifton JamesMcAlhany and new recommendations provided for this pt:  1.  HOLD Plavix until pt see's K. Lawrence NP for S/P TAVR-TCM appt on 04/14/17.  2.  INCREASE her ASA to taking 4 tablets of her 81 mg baby ASA, to equal 325 mg daily, until she comes for follow-up with Joni ReiningKathryn Lawrence NP next Thursday 6/28.   3.  Advise the pt to purchase OTC Benadryl, and take this as needed, and as indicated on the bottle.   4.  Advise the pt to purchase OTC Pepcid, and take this exactly how prescribed on the bottle, until symptoms are gone.   5.  Follow-up as planned with Joni ReiningKathryn Lawrence NP for next Thursday 6/28, for S/P TAVR- TCM appt.    Made the pts Daughter aware of recommendations, as mentioned above, per Dr Clifton JamesMcAlhany.  Daughter verbalized understanding and agrees with this plan.

## 2017-04-11 ENCOUNTER — Telehealth: Payer: Self-pay | Admitting: Adult Health

## 2017-04-11 DIAGNOSIS — R197 Diarrhea, unspecified: Secondary | ICD-10-CM

## 2017-04-11 NOTE — Telephone Encounter (Signed)
Please call patient regarding symptoms / tg  °

## 2017-04-11 NOTE — Telephone Encounter (Signed)
Also add BMET to check potassium status with lose stools. Thank you.

## 2017-04-11 NOTE — Telephone Encounter (Signed)
Pt has had 6 or 7  episodes of near syncope over the weekend,states one episode earlier today,says it last for less than 12 seconds.I asked her to go to the ER but she declines.She wanted an earlier apt than Thursday this week and asked to be  placed on call back list.She is insistent not to go to the ER,states it may be because she is not eating well, or she has loose stools.I still tried to get her to be seen ASAP and she refuses. She said she was going to call Dr Gibson RampMcAlhany's office again, as she could not get through.   Will FYI Dr Lyman BishopLawrence

## 2017-04-11 NOTE — Telephone Encounter (Signed)
She will need to have orthostatic BP check, and also have CBC please. She may need to stop antihypertensive if BP is low. Can she be seen for orthostatic BP checks by RN?

## 2017-04-12 NOTE — Addendum Note (Signed)
Addended by: Marlyn CorporalARLTON, Mahlik Lenn A on: 04/12/2017 08:51 AM   Modules accepted: Orders

## 2017-04-12 NOTE — Telephone Encounter (Signed)
Pt will come by at 3 pm tomorrow and pick up lab slip,get labs done for Dr Lyman BishopLawrence and then come back for nurse apt for orthostatics at 4 pm.Patient has f/u on Thursday with Dr Lyman BishopLawrence

## 2017-04-12 NOTE — Telephone Encounter (Addendum)
Pt's phone no answer, no machine, LM with daughter [name], advising her of nurse apt tomorrow at 4 pm for orthostatics and lab work.I have left lab slips at drawer at reception area

## 2017-04-13 ENCOUNTER — Other Ambulatory Visit (HOSPITAL_COMMUNITY)
Admission: RE | Admit: 2017-04-13 | Discharge: 2017-04-13 | Disposition: A | Discharge status: Home / Self Care | Payer: Medicare Other | Source: Ambulatory Visit | Attending: Adult Health | Admitting: Adult Health

## 2017-04-13 ENCOUNTER — Ambulatory Visit: Payer: Medicare Other | Admitting: *Deleted

## 2017-04-13 DIAGNOSIS — Z952 Presence of prosthetic heart valve: Secondary | ICD-10-CM

## 2017-04-13 DIAGNOSIS — R197 Diarrhea, unspecified: Secondary | ICD-10-CM | POA: Insufficient documentation

## 2017-04-13 LAB — BASIC METABOLIC PANEL WITH GFR
Anion gap: 8 (ref 5–15)
BUN: 10 mg/dL (ref 6–20)
CO2: 27 mmol/L (ref 22–32)
Calcium: 9.2 mg/dL (ref 8.9–10.3)
Chloride: 101 mmol/L (ref 101–111)
Creatinine, Ser: 0.62 mg/dL (ref 0.44–1.00)
GFR calc Af Amer: >60 mL/min
GFR calc non Af Amer: >60 mL/min
Glucose, Bld: 104 mg/dL — ABNORMAL HIGH (ref 65–99)
Potassium: 3.4 mmol/L — ABNORMAL LOW (ref 3.5–5.1)
Sodium: 136 mmol/L (ref 135–145)

## 2017-04-13 LAB — CBC
HCT: 32.9 % — ABNORMAL LOW (ref 36.0–46.0)
Hemoglobin: 11 g/dL — ABNORMAL LOW (ref 12.0–15.0)
MCH: 30.6 pg (ref 26.0–34.0)
MCHC: 33.4 g/dL (ref 30.0–36.0)
MCV: 91.6 fL (ref 78.0–100.0)
PLATELETS: 187 10*3/uL (ref 150–400)
RBC: 3.59 MIL/uL — AB (ref 3.87–5.11)
RDW: 13.7 % (ref 11.5–15.5)
WBC: 6.5 10*3/uL (ref 4.0–10.5)

## 2017-04-13 NOTE — Progress Notes (Signed)
When pt moved from lying to sitting she stated that she felt as if her eyes were crossed. Nurse did not see change in eyes or pupils. Pt reported having this same feeling while driving from [place] to office today. Patient states the spells only last 12 sec.

## 2017-04-14 ENCOUNTER — Ambulatory Visit (INDEPENDENT_AMBULATORY_CARE_PROVIDER_SITE_OTHER): Payer: Medicare Other | Admitting: Adult Health

## 2017-04-14 ENCOUNTER — Encounter: Payer: Self-pay | Admitting: Adult Health

## 2017-04-14 VITALS — BP 136/70 | HR 77 | Ht 63.0 in | Wt 112.0 lb

## 2017-04-14 DIAGNOSIS — R0989 Other specified symptoms and signs involving the circulatory and respiratory systems: Secondary | ICD-10-CM | POA: Diagnosis not present

## 2017-04-14 DIAGNOSIS — I2510 Atherosclerotic heart disease of native coronary artery without angina pectoris: Secondary | ICD-10-CM | POA: Diagnosis not present

## 2017-04-14 DIAGNOSIS — R42 Dizziness and giddiness: Secondary | ICD-10-CM

## 2017-04-14 DIAGNOSIS — Z952 Presence of prosthetic heart valve: Secondary | ICD-10-CM | POA: Diagnosis not present

## 2017-04-14 MED ORDER — POTASSIUM CHLORIDE CRYS ER 20 MEQ PO TBCR: 20.0000 meq | EXTENDED_RELEASE_TABLET | Freq: Every day | ORAL | 3 refills | Status: DC

## 2017-04-14 NOTE — Patient Instructions (Signed)
Your physician recommends that you schedule a follow-up appointment in: 1 Month.   Your physician has recommended you make the following change in your medication:  Start Potassium 20 mEq Daily   Your physician recommends that you return for lab work in: Today   Your physician has requested that you have an echocardiogram. Echocardiography is a painless test that uses sound waves to create images of your heart. It provides your doctor with information about the size and shape of your heart and how well your heart's chambers and valves are working. This procedure takes approximately one hour. There are no restrictions for this procedure.  Your physician has requested that you have a carotid duplex. This test is an ultrasound of the carotid arteries in your neck. It looks at blood flow through these arteries that supply the brain with blood. Allow one hour for this exam. There are no restrictions or special instructions.  Have US of left leg done.   If you need a refill on your cardiac medications before your next appointment, please call your pharmacy.  Thank you for choosing [facility]!

## 2017-04-14 NOTE — Progress Notes (Signed)
Cardiology Office Note   Date:  04/14/2017   ID:  April Hull, DOB July 04, 1941, MRN [id number]  PCP:  Ignatius SpeckingVyas, Dhruv B, MD  Cardiologist:  Diona BrownerMcDowell   Chief Complaint  Patient presents with  . Hospitalization Follow-up  . Aortic Stenosis      History of Present Illness: April Hull is a 76 y.o. female who presents for posthospitalization follow-up after admission for severe aortic valve stenosis, status post TAVR by Dr's  Clifton JamesMcAlhany and Cornelius Moraswen with Edwards's Sapien 3 THV (size 26mm, model # 9600TFX, serial # [id number]), on 04/05/2017. Other history includes hypertension, long-standing tobacco abuse with COPD, hypothyroidism, hyperlipidemia, and arthritis. The patient is to be scheduled for echocardiogram one month post procedure.  She comes today with multiple somatic complaints. Dizziness, feelings of near syncope, "funny feeling in her head" that lasts 15-20 seconds. Generalized fatigue. Breathing is much better, no chest pain.   Past Medical History:  Diagnosis Date  . Anxiety    has had panic attacks, none reccently  . Aortic stenosis   . Colon polyp   . Complication of anesthesia    hard time waking up  . COPD (chronic obstructive pulmonary disease) (HCC)   . Diarrhea    since 2016- colon surgery  . Dyspnea    worse May 2018  . Essential hypertension, benign   . GERD (gastroesophageal reflux disease)    in 2016 not currently  . Heart murmur   . History of blood transfusion   . History of pneumonia   . Hypothyroidism   . 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 Mixed hyperlipidemia   . OA (osteoarthritis of the spine)   . Osteoporosis   . 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 Sigmoid diverticulitis     Past Surgical History:  Procedure Laterality Date  . ABDOMINAL HYSTERECTOMY  1972  . APPENDECTOMY      . COLONOSCOPY  10/15/05  . COLOSTOMY N/A 11/27/2014   Procedure: COLOSTOMY;  Surgeon: Harriette Bouillonhomas Cornett, MD;  [facility];  Service: General;  Laterality: N/A;  . COLOSTOMY TAKEDOWN N/A 04/09/2015   Procedure: LAPAROSCOPIC ASSISTED COLOSTOMY CLOSURE; RIGID SIGMOIDOSCOPY;  Surgeon: Harriette Bouillonhomas Cornett, MD;  Location: [facility] OR;  Service: General;  Laterality: N/A;  . ESOPHAGOGASTRODUODENOSCOPY  10/15/05  . hammer toes Bilateral    6  . LAPAROSCOPIC SIGMOID COLECTOMY N/A 11/27/2014   Procedure: LAPAROSCOPIC ASSISTED SIGMOID COLECTOMY;  Surgeon: Harriette Bouillonhomas Cornett, MD;  Location: [facility] OR;  Service: General;  Laterality: N/A;  . LUMBAR DISC SURGERY     x 2  . RIGHT/LEFT HEART CATH AND CORONARY ANGIOGRAPHY N/A 03/04/2017   Procedure: Right/Left Heart Cath and Coronary Angiography;  Surgeon: Kathleene HazelMcAlhany, Christopher D, MD;  Location: [facility] INVASIVE CV LAB;  Service: Cardiovascular;  Laterality: N/A;  . ROTATOR CUFF REPAIR Left 2009  . TEE WITHOUT CARDIOVERSION N/A 04/05/2017   Procedure: TRANSESOPHAGEAL ECHOCARDIOGRAM (TEE);  Surgeon: Kathleene HazelMcAlhany, Christopher D, MD;  [facility];  Service: Open Heart Surgery;  Laterality: N/A;  . TRANSCATHETER AORTIC VALVE REPLACEMENT, TRANSFEMORAL N/A 04/05/2017   Procedure: TRANSCATHETER AORTIC VALVE REPLACEMENT, TRANSFEMORAL;  Surgeon: Kathleene HazelMcAlhany, Christopher D, MD;  Location: [facility] OR;  Service: Open Heart Surgery;  Laterality: N/A;     Current Outpatient Prescriptions  Medication Sig Dispense Refill  . aspirin EC 81 MG tablet Take 324 mg by mouth daily.    .Marland Kitchen  atorvastatin (LIPITOR) 10 MG tablet Take 10 mg by mouth at bedtime.     . Calcium Carbonate-Vitamin D (CALCIUM 500 + D PO) Take 1 tablet by mouth every other day.    . levothyroxine (SYNTHROID, LEVOTHROID) 88 MCG tablet Take 88 mcg by mouth daily before breakfast.    . lisinopril (PRINIVIL,ZESTRIL) 5 MG tablet Take 5 mg by mouth daily.    . Multiple Vitamin (MULTIVITAMIN WITH MINERALS) TABS tablet Take 1 tablet by mouth every  other day.    . pantoprazole (PROTONIX) 40 MG tablet Take 40 mg by mouth daily.    . clopidogrel (PLAVIX) 75 MG tablet Take 1 tablet (75 mg total) by mouth daily with breakfast. (Patient not taking: Reported on 04/14/2017) 90 tablet 0  . potassium chloride SA (KLOR-CON M20) 20 MEQ tablet Take 1 tablet (20 mEq total) by mouth daily. 90 tablet 3   No current facility-administered medications for this visit.     Allergies:   Bactrim [sulfamethoxazole-trimethoprim] and Codeine    Social History:  The patient  reports that she quit smoking about 3 weeks ago. Her smoking use included Cigarettes. She started smoking about 60 years ago. She has a 10.00 pack-year smoking history. She has never used smokeless tobacco. She reports that she does not drink alcohol or use drugs.   Family History:  The patient's family history includes Breast cancer in her sister; CAD in her brother; COPD in her father; Congestive Heart Failure in her mother; Emphysema in her father; Heart disease in her sister; Uterine cancer in her daughter.    ROS: All other systems are reviewed and negative. Unless otherwise mentioned in H&P    PHYSICAL EXAM: VS:  BP 136/70   Pulse 77   Ht 5\' 3"  (1.6 m)   Wt 112 lb (50.8 kg)   BMI 19.84 kg/m  , BMI Body mass index is 19.84 kg/m. GEN: Well nourished, well developed, in no acute distress  HEENT: normal  Neck: no JVD, carotid bruits, or masses Cardiac: RRR; 1\6 systolic murmurs, rubs, or gallops,no edema  Respiratory:  clear to auscultation bilaterally, normal work of breathing GI: soft, nontender, nondistended, + BS MS: no deformity or atrophy  Ecchymosis at the groins bilaterally. Loud bruit in the left femoral cath insertion site. Bruit heard on the right. Abdominal bruit auscultated.  Skin: warm and dry, no rash Neuro:  Strength and sensation are intact Psych: anxious mood, full affect  Recent Labs: 03/31/2017: ALT 14 04/06/2017: Magnesium 1.7 04/13/2017: BUN 10;  Creatinine, Ser 0.62; Hemoglobin 11.0; Platelets 187; Potassium 3.4; Sodium 136    Lipid Panel    Component Value Date/Time   TRIG 129 12/02/2014 0215      Wt Readings from Last 3 Encounters:  04/14/17 112 lb (50.8 kg)  04/07/17 122 lb 12.8 oz (55.7 kg)  03/31/17 112 lb (50.8 kg)      Other studies Reviewed: Cardiac Cath  Conclusion 03/04/2017    Ost RCA to Prox RCA lesion, 20 %stenosed.  There is severe aortic valve stenosis.  LV end diastolic pressure is normal.   1. Mild non-obstructive CAD 2. Normal filling pressures 3. Severe aortic valve stenosis (mean gradient 37.5 mmHg, peak to peak gradient 47 mmHg, AVA 0.70)   Echocardiogram 04/07/2017 Left ventricle: The cavity size was normal. There was moderate   concentric hypertrophy. Systolic function was normal. The   estimated ejection fraction was in the range of 60% to 65%. Wall   motion was normal; there were  no regional wall motion   abnormalities. There was an increased relative contribution of   atrial contraction to ventricular filling. Doppler parameters are   consistent with abnormal left ventricular relaxation (grade 1   diastolic dysfunction). - Aortic valve: S/P TAVR with an AV bioprosthesis which is well   seated in the AV position. There is no perivalvular AI. Mean AV   gradient is and peak AV gradient is . The AVA is   calculated at 3.52cm2. Valve area (VTI): 3.52 cm^2. Valve area   (Vmax): 2.7 cm^2. Valve area (Vmean): 2.87 cm^2. - Mitral valve: Minimal focal calcification of the anterior leaflet   (medial segment(s)), with mild involvement of chords. There was   mild regurgitation.   ASSESSMENT AND PLAN:  1. S/P TAVR: She states she is feeling much better concerning breathing, no chest pain. She continues to have episodes of near syncope, and dizziness. She is scheduled for a repeat echocardiogram in July. She is to keep this appointment.  The patient was started on Plavix on  discharge, but this was discontinued as she had an allergic reaction causing a rash. She was started on aspirin  I have reviewed her recent labs. She is mildly hypokalemic, Potassium 3.4, and should be 4.0 in CAD patients. I will give her potassium 20 mEq daily. Follow up BMET and Mg as she is on a PPI.  2. Dizziness: I have checked orthostatic BP's.  She is negative for Orthostatic hypotension. We'll check carotid Doppler studies to evaluate for carotid disease causing symptoms. I will not make any medication changes at this time. I will check a BMET and a magnesium to evaluate for potassium status and magnesium status in a few days after adding by mouth potassium.  3. Peripheral arterial disease: I have reviewed her most recent CT angios pelvis which was dictated on 03/09/2017. I am hearing a harsh left femoral bruit on exam of her catheterization insertion sites. It is noted on the CT scan that her left common femoral artery minimal diameter is 4.5 x 3.5 with mild tortuosity and moderate calcification. She denies pain there is no evidence of edema. She will have a Doppler ultrasound to rule out pseudoaneurysm post catheter insertion for TAVR. If no pseudoaneurysm, with known PAD, may need to revisit this on follow-up to evaluate for worsening peripheral arterial disease.  4. GERD: The patient is on PPI.  5. Hypothyroidism: She is now continuing on thyroid medication. She is to follow-up with her PCP for ongoing management.   Current medicines are reviewed at length with the patient today.  I have spent over 40 minutes with this patient answering numerous questions, and going over medications.   Labs/ tests ordered today include: BMET, Mg.  Bettey Mare. Liborio Nixon, ANP, AACC   04/14/2017 4:06 PM    [facility] [address] [place], [place] [address] Phone: [phone]; Fax: [phone]

## 2017-04-15 ENCOUNTER — Other Ambulatory Visit: Payer: Self-pay | Admitting: Adult Health

## 2017-04-15 ENCOUNTER — Telehealth: Payer: Self-pay | Admitting: *Deleted

## 2017-04-15 ENCOUNTER — Ambulatory Visit (HOSPITAL_COMMUNITY): Admission: RE | Admit: 2017-04-15 | Payer: Medicare Other | Source: Ambulatory Visit

## 2017-04-15 DIAGNOSIS — R0989 Other specified symptoms and signs involving the circulatory and respiratory systems: Secondary | ICD-10-CM

## 2017-04-15 MED ORDER — PRASUGREL HCL 10 MG PO TABS: 10.0000 mg | ORAL_TABLET | Freq: Every day | ORAL | 6 refills | Status: DC

## 2017-04-15 NOTE — Telephone Encounter (Signed)
-----   Message from Jodelle GrossKathryn M Lawrence, NP sent at 04/14/2017  5:30 PM EDT ----- Spoke with Dr. Julious PayerMc Alhaney, since she is not on Plavix due to rash, he wants her on Effient 10 mg daily instead. He also said that she did not need a ultrasound of her left groin. Please cancel the ultrasound. The seal left over can make the bruit sound. He will follow up with her in July.

## 2017-04-18 ENCOUNTER — Ambulatory Visit (HOSPITAL_COMMUNITY)
Admission: RE | Admit: 2017-04-18 | Discharge: 2017-04-18 | Disposition: A | Discharge status: Home / Self Care | Payer: Medicare Other | Source: Ambulatory Visit | Attending: Adult Health | Admitting: Adult Health

## 2017-04-18 ENCOUNTER — Other Ambulatory Visit (HOSPITAL_COMMUNITY)
Admission: RE | Admit: 2017-04-18 | Discharge: 2017-04-18 | Disposition: A | Discharge status: Home / Self Care | Payer: Medicare Other | Source: Ambulatory Visit | Attending: Adult Health | Admitting: Adult Health

## 2017-04-18 DIAGNOSIS — R42 Dizziness and giddiness: Secondary | ICD-10-CM | POA: Diagnosis present

## 2017-04-18 DIAGNOSIS — I6523 Occlusion and stenosis of bilateral carotid arteries: Secondary | ICD-10-CM | POA: Insufficient documentation

## 2017-04-18 LAB — BASIC METABOLIC PANEL
Anion gap: 7 (ref 5–15)
BUN: 15 mg/dL (ref 6–20)
CO2: 26 mmol/L (ref 22–32)
CREATININE: 0.64 mg/dL (ref 0.44–1.00)
Calcium: 9.5 mg/dL (ref 8.9–10.3)
Chloride: 102 mmol/L (ref 101–111)
GFR calc Af Amer: >60 mL/min (ref >60)
GLUCOSE: 102 mg/dL — AB (ref 65–99)
Potassium: 4.3 mmol/L (ref 3.5–5.1)
SODIUM: 135 mmol/L (ref 135–145)

## 2017-04-18 LAB — MAGNESIUM: MAGNESIUM: 1.7 mg/dL (ref 1.7–2.4)

## 2017-05-13 ENCOUNTER — Ambulatory Visit (INDEPENDENT_AMBULATORY_CARE_PROVIDER_SITE_OTHER): Payer: Medicare Other | Admitting: Cardiovascular Disease

## 2017-05-13 ENCOUNTER — Ambulatory Visit (HOSPITAL_COMMUNITY): Payer: Medicare Other | Attending: Cardiology

## 2017-05-13 ENCOUNTER — Encounter (INDEPENDENT_AMBULATORY_CARE_PROVIDER_SITE_OTHER): Payer: Self-pay

## 2017-05-13 ENCOUNTER — Other Ambulatory Visit: Payer: Self-pay

## 2017-05-13 ENCOUNTER — Encounter: Payer: Self-pay | Admitting: Cardiovascular Disease

## 2017-05-13 VITALS — BP 132/80 | HR 75 | Ht 63.0 in | Wt 113.0 lb

## 2017-05-13 DIAGNOSIS — Z953 Presence of xenogenic heart valve: Secondary | ICD-10-CM | POA: Diagnosis not present

## 2017-05-13 DIAGNOSIS — I350 Nonrheumatic aortic (valve) stenosis: Secondary | ICD-10-CM | POA: Diagnosis not present

## 2017-05-13 DIAGNOSIS — Z952 Presence of prosthetic heart valve: Secondary | ICD-10-CM

## 2017-05-13 DIAGNOSIS — I340 Nonrheumatic mitral (valve) insufficiency: Secondary | ICD-10-CM | POA: Insufficient documentation

## 2017-05-13 NOTE — Progress Notes (Signed)
Chief Complaint  Patient presents with  . Follow-up    History of Present Illness: 76 yo female with history of COPD, HTN, hypothyroidism, HLD and severe aortic stenosis who is here today for one month TAVR follow up. Her AS was followed by Dr. Diona Browner and she developed progressively worsening dyspnea and dizziness. She underwent TAVR on 04/05/17 with placement of a 26 mm Sapien 3 bioprosthetic aortic valve from the right transfemoral approach. Her procedure and hospital course was uneventful. Echo post procedure with normal LV function. The valve was working well. She did not tolerate Plavix due to a rash and itching. She is now on ASA and Effient.   She is here today for follow up. The patient denies any chest pain, palpitations, lower extremity edema, orthopnea, PND. She notes occasional dizzy episodes that last for a few seconds. She has occasional itching but better off of Plavix.  She is tolerating Effient. She notes continued dyspnea with exertion. She is still smoking. She has no leg pain.   Primary Care Physician: [name], MD Primary Cardiology: Diona Browner  Past Medical History:  Diagnosis Date  . Anxiety    has had panic attacks, none reccently  . Aortic stenosis   . Colon polyp   . Complication of anesthesia    hard time waking up  . COPD (chronic obstructive pulmonary disease) (HCC)   . Diarrhea    since 2016- colon surgery  . Dyspnea    worse May 2018  . Essential hypertension, benign   . GERD (gastroesophageal reflux disease)    in 2016 not currently  . Heart murmur   . History of blood transfusion   . History of pneumonia   . Hypothyroidism   . 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 Mixed hyperlipidemia   . OA (osteoarthritis of the spine)   . Osteoporosis   . 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 Sigmoid diverticulitis     Past Surgical History:  Procedure Laterality Date  . ABDOMINAL HYSTERECTOMY  1972  . APPENDECTOMY    . COLONOSCOPY  10/15/05  . COLOSTOMY N/A 11/27/2014   Procedure: COLOSTOMY;  Surgeon: Harriette Bouillon, MD;  Location: [facility] OR;  Service: General;  Laterality: N/A;  . COLOSTOMY TAKEDOWN N/A 04/09/2015   Procedure: LAPAROSCOPIC ASSISTED COLOSTOMY CLOSURE; RIGID SIGMOIDOSCOPY;  Surgeon: Harriette Bouillon, MD;  Location: [facility] OR;  Service: General;  Laterality: N/A;  . ESOPHAGOGASTRODUODENOSCOPY  10/15/05  . hammer toes Bilateral    6  . LAPAROSCOPIC SIGMOID COLECTOMY N/A 11/27/2014   Procedure: LAPAROSCOPIC ASSISTED SIGMOID COLECTOMY;  Surgeon: Harriette Bouillon, MD;  Location: [facility] OR;  Service: General;  Laterality: N/A;  . LUMBAR DISC SURGERY     x 2  . RIGHT/LEFT HEART CATH AND CORONARY ANGIOGRAPHY N/A 03/04/2017   Procedure: Right/Left Heart Cath and Coronary Angiography;  Surgeon: Kathleene Hazel, MD;  [facility]: [facility] INVASIVE CV LAB;  Service: Cardiovascular;  Laterality: N/A;  . ROTATOR CUFF REPAIR Left 2009  . TEE WITHOUT CARDIOVERSION N/A 04/05/2017   Procedure: TRANSESOPHAGEAL ECHOCARDIOGRAM (TEE);  Surgeon: Kathleene Hazel, MD;  Location[facility] OR;  Service: Open Heart Surgery;  Laterality: N/A;  . TRANSCATHETER AORTIC VALVE REPLACEMENT, TRANSFEMORAL N/A 04/05/2017   Procedure: TRANSCATHETER AORTIC VALVE REPLACEMENT, TRANSFEMORAL;  Surgeon: Kathleene Hazel, MD;  Location: [facility] OR;  Service: Open Heart  Surgery;  Laterality: N/A;    Current Outpatient Prescriptions  Medication Sig Dispense Refill  . atorvastatin (LIPITOR) 10 MG tablet Take 10 mg by mouth at bedtime.     . Calcium Carbonate-Vitamin D (CALCIUM 500 + D PO) Take 1 tablet by mouth every other day.    . levothyroxine (SYNTHROID, LEVOTHROID) 88 MCG tablet Take 88 mcg by mouth daily before breakfast.    . lisinopril (PRINIVIL,ZESTRIL) 5 MG tablet Take 5 mg by mouth daily.    . Multiple  Vitamin (MULTIVITAMIN WITH MINERALS) TABS tablet Take 1 tablet by mouth every other day.    . pantoprazole (PROTONIX) 40 MG tablet Take 40 mg by mouth daily.    . prasugrel (EFFIENT) 10 MG TABS tablet Take 1 tablet (10 mg total) by mouth daily. 30 tablet 6   No current facility-administered medications for this visit.     Allergies  Allergen Reactions  . Bactrim [Sulfamethoxazole-Trimethoprim] Hives, Itching and Other (See Comments)    Irregular heart beat  . Plavix [Clopidogrel Bisulfate] Hives    Rash  . Codeine Palpitations    Dizziness, sweats     Social History   Social History  . Marital status: Widowed    Spouse name: N/A  . Number of children: N/A  . Years of education: N/A   Occupational History  . retired-worked at [employer]    Social History Main Topics  . Smoking status: Former Smoker    Packs/day: 0.25    Years: 40.00    Types: Cigarettes    Start date: 08/04/1956    Quit date: 03/24/2017  . Smokeless tobacco: Never Used     Comment: 2 -3  . Alcohol use No  . Drug use: No  . Sexual activity: No   Other Topics Concern  . Not on file   Social History Narrative  . No narrative on file    Family History  Problem Relation Age of Onset  . Congestive Heart Failure Mother   . COPD Father   . Emphysema Father   . CAD Brother   . Breast cancer Sister   . Heart disease Sister   . Uterine cancer Daughter     Review of Systems:  As stated in the HPI and otherwise negative.   BP 132/80   Pulse 75   Ht 5\' 3"  (1.6 m)   Wt 113 lb (51.3 kg)   SpO2 98%   BMI 20.02 kg/m   Physical Examination: General: Well developed, well nourished, NAD  HEENT: OP clear, mucus membranes moist  SKIN: warm, dry. No rashes. Neuro: No focal deficits  Musculoskeletal: Muscle strength 5/5 all ext  Psychiatric: Mood and affect normal  Neck: No JVD, no carotid bruits, no thyromegaly, no lymphadenopathy.  Lungs:Clear bilaterally, no wheezes, rhonci,  crackles Cardiovascular: Regular rate and rhythm. No murmurs, gallops or rubs. Abdomen:Soft. Bowel sounds present. Non-tender.  Extremities: No lower extremity edema. Pulses are 2 + in the bilateral DP/PT.  Echo 05/13/17: Normal LV function. Bioprosthetic valve is working well with no AI.   EKG:  EKG is not ordered today.    Recent Labs: 03/31/2017: ALT 14 04/13/2017: Hemoglobin 11.0; Platelets 187 04/18/2017: BUN 15; Creatinine, Ser 0.64; Magnesium 1.7; Potassium 4.3; Sodium 135   Lipid Panel    Component Value Date/Time   TRIG 129 12/02/2014 0215     Wt Readings from Last 3 Encounters:  05/13/17 113 lb (51.3 kg)  04/14/17 112 lb (50.8 kg)  04/07/17 122 lb 12.8  oz (55.7 kg)     Other studies Reviewed: Additional studies/ records that were reviewed today include: Echo.  Assessment and Plan:   1. Severe aortic valve stenosis: She is now one month post TAVR with placement of a 26 mm Sapien 3 bioprosthetic aortic valve from the left transfemoral approach. She is doing well. She is NYHA class 2. Echo today shows normal LV function and normal function of the bioprosthetic AV. Will continue Effient. Will stop ASA. Stop Kdur as potassium 4.3 several weeks ago and she does not like taking it. She is not on Lasix.   Will plan f/u with Dr. Diona BrownerMcDowell in 6 months and follow up in one year with me with echo at the time of follow up with me in one year.   Current medicines are reviewed at length with the patient today.  The patient does not have concerns regarding medicines.  No orders of the defined types were placed in this encounter.   Signed, Verne Carrowhristopher Joelene Barriere, MD 05/13/2017 4:22 PM    [facility] [address], [place], [place]  [address] Phone: [phone]; Fax: [phone]

## 2017-05-13 NOTE — Patient Instructions (Signed)
Medication Instructions:  Your physician has recommended you make the following change in your medication:  Stop Aspirin. Stop Potassium    Labwork: none  Testing/Procedures:. Your physician has requested that you have an echocardiogram. Echocardiography is a painless test that uses sound waves to create images of your heart. It provides your doctor with information about the size and shape of your heart and how well your heart's chambers and valves are working. This procedure takes approximately one hour. There are no restrictions for this procedure.  To be done in 11 months on day of appointment with Dr. Clifton JamesMcAlhany. We will call you to schedule this appointment  Follow-Up:   Your physician wants you to follow-up in: 6 months with Dr. Diona BrownerMcDowell. You will receive a reminder letter in the mail two months in advance. If you don't receive a letter, please call our office to schedule the follow-up appointment.  Your physician recommends that you schedule a follow-up appointment in: 12 months with Dr. Clifton JamesMcAlhany.  We will call you to schedule this appointment     Any Other Special Instructions Will Be Listed Below (If Applicable).     If you need a refill on your cardiac medications before your next appointment, please call your pharmacy.

## 2017-06-02 ENCOUNTER — Telehealth: Payer: Self-pay | Admitting: Adult Health

## 2017-06-02 NOTE — Telephone Encounter (Signed)
Would like to speak w/ April BienenstockKisha

## 2017-06-03 NOTE — Telephone Encounter (Signed)
Called patient. No answer. Left message to call back.  

## 2017-06-06 NOTE — Telephone Encounter (Signed)
Called patient. No answer. Left message to call back.  

## 2017-06-06 NOTE — Telephone Encounter (Signed)
Pt states that she is still having itching as she did when she was taking Plavix. She no longer has the rash but reports that the itching is worse at night. She reports that Dr. Clifton James stopped her ASA and Potassium at the last visit and started her on Effient. Please advise.

## 2017-06-09 NOTE — Telephone Encounter (Signed)
She can take Benadryl for the itching prn. Have her contact Dr. Clifton James for more recommendations on antiplatelet therapy post TAVR.

## 2017-06-09 NOTE — Telephone Encounter (Signed)
Patient notified and voiced understanding.

## 2017-08-01 ENCOUNTER — Encounter: Payer: Self-pay | Admitting: Emergency Medicine

## 2017-08-01 ENCOUNTER — Ambulatory Visit (INDEPENDENT_AMBULATORY_CARE_PROVIDER_SITE_OTHER): Payer: Medicare Other | Admitting: Emergency Medicine

## 2017-08-01 DIAGNOSIS — R05 Cough: Secondary | ICD-10-CM

## 2017-08-01 DIAGNOSIS — K2100 Gastro-esophageal reflux disease with esophagitis, without bleeding: Secondary | ICD-10-CM

## 2017-08-01 DIAGNOSIS — Z23 Encounter for immunization: Secondary | ICD-10-CM

## 2017-08-01 DIAGNOSIS — R058 Other specified cough: Secondary | ICD-10-CM

## 2017-08-01 DIAGNOSIS — Z720 Tobacco use: Secondary | ICD-10-CM

## 2017-08-01 DIAGNOSIS — J449 Chronic obstructive pulmonary disease, unspecified: Secondary | ICD-10-CM

## 2017-08-01 MED ORDER — LORATADINE 10 MG PO TABS: 10.0000 mg | ORAL_TABLET | Freq: Every day | ORAL | 5 refills | Status: DC

## 2017-08-01 MED ORDER — LOSARTAN POTASSIUM 50 MG PO TABS: 50.0000 mg | ORAL_TABLET | Freq: Every day | ORAL | 5 refills | Status: DC

## 2017-08-01 MED ORDER — TIOTROPIUM BROMIDE MONOHYDRATE 2.5 MCG/ACT IN AERS: 2.0000 | INHALATION_SPRAY | Freq: Every day | RESPIRATORY_TRACT | 0 refills | Status: DC

## 2017-08-01 MED ORDER — FLUTICASONE PROPIONATE 50 MCG/ACT NA SUSP: 2.0000 | Freq: Every day | NASAL | 5 refills | Status: DC

## 2017-08-01 NOTE — Progress Notes (Signed)
b Subjective:    Patient ID: April Hull, female    DOB: 01-Jun-1941, 76 y.o.   MRN: [id number]  HPI 76 year old former smoker (20 pack years) with a history of COPD, chronic cough, seen here before by Dr Craige Cotta, felt to have significant contributions from GERD, possibly also allergic rhinitis. She also is followed closely for aortic stenosis status post TAVR in June 2018. She had preoperative pulmonary function testing May 2018 that I personally reviewed. This shows severe obstruction, FEV1 1.13 L (58% predicted), hyperinflation, decreased diffusion capacity that does not fully correct to the normal range when adjusted for alveolar volume.   She is referred back today for evaluation of COPD and cough. She is on lisinopril 5 mg daily, pantoprazole 40 mg daily. Not currently on bronchodilators although she has been on Breo in the past without much change in her breathing. Today she reports that she is experiencing a lot of cough, worst at night when she goes to bed. Prod of clear mucous. Has about 2-3 spells a day, gets waked from sleep. She has some exertional SOB, notices it when doing sweeping, doing chores. She is able to shop, able to walk on flat ground. She hears wheeze, can happen at random, often assoc w her cough. Her reflux is better controlled - no breakthrough sx on PPI. She has rhinitis, occasionally congested. She takes benadryl qod for itching   Review of Systems  Constitutional: Negative for fever and unexpected weight change.  HENT: Negative for congestion, dental problem, ear pain, nosebleeds, postnasal drip, rhinorrhea, sinus pressure, sneezing, sore throat and trouble swallowing.   Eyes: Negative for redness and itching.  Respiratory: Positive for cough. Negative for chest tightness, shortness of breath and wheezing.   Cardiovascular: Negative for palpitations and leg swelling.  Gastrointestinal: Negative for nausea and vomiting.  Genitourinary: Negative for dysuria.    Musculoskeletal: Negative for joint swelling.  Skin: Negative for rash.  Neurological: Negative for headaches.  Hematological: Does not bruise/bleed easily.  Psychiatric/Behavioral: Negative for dysphoric mood. The patient is not nervous/anxious.    Past Medical History:  Diagnosis Date  . Anxiety    has had panic attacks, none reccently  . Aortic stenosis   . Colon polyp   . Complication of anesthesia    hard time waking up  . COPD (chronic obstructive pulmonary disease) (HCC)   . Diarrhea    since 2016- colon surgery  . Dyspnea    worse May 2018  . Essential hypertension, benign   . GERD (gastroesophageal reflux disease)    in 2016 not currently  . Heart murmur   . History of blood transfusion   . History of pneumonia   . Hypothyroidism   . 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 Mixed hyperlipidemia   . OA (osteoarthritis of the spine)   . Osteoporosis   . 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 Sigmoid diverticulitis      Family History  Problem Relation Age of Onset  . Congestive Heart Failure Mother   . COPD Father   . Emphysema Father   . CAD Brother   . Breast cancer Sister   . Heart disease Sister   . Uterine cancer Daughter      Social History   Social History  . Marital status: Widowed    Spouse name: N/A  .  Number of children: N/A  . Years of education: N/A   Occupational History  . retired-worked at [employer]    Social History Main Topics  . Smoking status: Former Smoker    Packs/day: 0.25    Years: 40.00    Types: Cigarettes    Start date: 08/04/1956    Quit date: 03/24/2017  . Smokeless tobacco: Never Used     Comment: 2 -3  . Alcohol use No  . Drug use: No  . Sexual activity: No   Other Topics Concern  . Not on file   Social History Narrative  . No narrative  on file     Allergies  Allergen Reactions  . Bactrim [Sulfamethoxazole-Trimethoprim] Hives, Itching and Other (See Comments)    Irregular heart beat  . Plavix [Clopidogrel Bisulfate] Hives    Rash  . Codeine Palpitations    Dizziness, sweats      Outpatient Medications Prior to Visit  Medication Sig Dispense Refill  . atorvastatin (LIPITOR) 10 MG tablet Take 10 mg by mouth at bedtime.     . Calcium Carbonate-Vitamin D (CALCIUM 500 + D PO) Take 1 tablet by mouth every other day.    . levothyroxine (SYNTHROID, LEVOTHROID) 88 MCG tablet Take 88 mcg by mouth daily before breakfast.    . lisinopril (PRINIVIL,ZESTRIL) 5 MG tablet Take 5 mg by mouth daily.    . Multiple Vitamin (MULTIVITAMIN WITH MINERALS) TABS tablet Take 1 tablet by mouth every other day.    . pantoprazole (PROTONIX) 40 MG tablet Take 40 mg by mouth daily.     No facility-administered medications prior to visit.         Objective:   Physical Exam Vitals:   08/01/17 0911  BP: 130/68  Pulse: 74  SpO2: 97%  Weight: 113 lb (51.3 kg)  Height:  (1.6 m)   Gen: Pleasant, well-nourished, in no distress,  normal affect  ENT: No lesions,  mouth clear,  oropharynx clear, no postnasal drip  Neck: No JVD, no Stridor  Lungs: No use of accessory muscles, no crackles or wheezing   Cardiovascular: RRR, I do not hear a murmur  Musculoskeletal: No deformities, no cyanosis or clubbing  Neuro: alert, non focal  Skin: Warm, no lesions or rashes     Assessment & Plan:  Tobacco abuse Congratulated her for her cessation  COPD (chronic obstructive pulmonary disease) Severe disease noted on pulmonary function testing May 2018. We will try initiation of bronchodilators.  We will start Spiriva respimat, 2 puffs once a day. To see if you benefit.  Flu shot today Follow with Dr Delton Coombes in 1 month or next available to assess your progress on the new medications  GERD (gastroesophageal reflux disease) Appears to be well  controlled. Continue current pantoprazole  Upper airway cough syndrome We will try changing lisinopril to an alternative. Stop for now.  Please start losartan  once a day.  Continue your pantoprazole  once a day. Take Please start loratadine  once a day.  Please start fluticasone nasal spray, 2 sprays each nostril once a day  Levy Pupa, MD, PhD 08/01/2017, 9:35 AM [facility] [phone] or if no answer [phone]

## 2017-08-01 NOTE — Assessment & Plan Note (Signed)
Severe disease noted on pulmonary function testing May 2018. We will try initiation of bronchodilators.  We will start Spiriva respimat, 2 puffs once a day. To see if you benefit.  Flu shot today Follow with Dr Delton Coombes in 1 month or next available to assess your progress on the new medications

## 2017-08-01 NOTE — Assessment & Plan Note (Signed)
We will try changing lisinopril to an alternative. Stop for now.  Please start losartan  once a day.  Continue your pantoprazole  once a day. Take Please start loratadine  once a day.  Please start fluticasone nasal spray, 2 sprays each nostril once a day

## 2017-08-01 NOTE — Assessment & Plan Note (Signed)
Congratulated her for her cessation

## 2017-08-01 NOTE — Assessment & Plan Note (Signed)
Appears to be well controlled. Continue current pantoprazole

## 2017-08-01 NOTE — Patient Instructions (Addendum)
We will try changing lisinopril to an alternative. Stop for now.  Please start losartan  once a day.  Continue your pantoprazole  once a day. Take Please start loratadine  once a day.  Please start fluticasone nasal spray, 2 sprays each nostril once a day We will start Spiriva respimat, 2 puffs once a day. To see if you benefit.  Flu shot today Follow with Dr Delton Coombes in 1 month or next available to assess your progress on the new medications

## 2017-09-01 ENCOUNTER — Ambulatory Visit: Payer: Medicare Other | Admitting: Emergency Medicine

## 2017-10-20 ENCOUNTER — Telehealth: Payer: Self-pay | Admitting: Emergency Medicine

## 2017-10-20 ENCOUNTER — Ambulatory Visit (INDEPENDENT_AMBULATORY_CARE_PROVIDER_SITE_OTHER): Payer: Medicare Other

## 2017-10-20 ENCOUNTER — Other Ambulatory Visit: Payer: Self-pay

## 2017-10-20 ENCOUNTER — Telehealth: Payer: Self-pay | Admitting: Cardiology

## 2017-10-20 DIAGNOSIS — R0602 Shortness of breath: Secondary | ICD-10-CM

## 2017-10-20 DIAGNOSIS — Z952 Presence of prosthetic heart valve: Secondary | ICD-10-CM | POA: Diagnosis not present

## 2017-10-20 NOTE — Telephone Encounter (Signed)
Pre-cert Verification for the following procedure   ECHO -scheduled for 10/20/2017 [place] office

## 2017-10-20 NOTE — Telephone Encounter (Signed)
Left message for patient regarding message today. Patient is needing to return call.  X1 

## 2017-10-20 NOTE — Telephone Encounter (Signed)
Patient called stating that she has been having shortness of breath for several weeks now. States that she is also getting a bad headache.

## 2017-10-20 NOTE — Telephone Encounter (Signed)
Patient notified and verbalized understanding.  She agrees to do Echo.  She will come today at 2:30.  Also, advised her in the meantime to get her appointment rescheduled with her lung doctor (missed due to weather).

## 2017-10-20 NOTE — Telephone Encounter (Signed)
She is status post TAVR for treatment of aortic stenosis this past summer. Would not necessarily expect cardiac decompensation at this point, but would go ahead and get a follow-up echocardiogram prior to her visit for reassessment. She does have significant lung disease which could be contributing to her symptoms as well.

## 2017-10-20 NOTE — Telephone Encounter (Signed)
Patient was not calling to see if medicine had been called in, states it is on the news that Losartan has been RECALLED and wants to know what she should do.  CB is [phone].

## 2017-10-20 NOTE — Telephone Encounter (Signed)
lmtcb x1 for pt. It appears that Losartan was sent in on 08/01/17 with 5 refills.

## 2017-10-20 NOTE — Telephone Encounter (Signed)
Also, c/o having funny chest pains.  Not that much, rated 5/10.  Not able to tell if her lungs are bothering or heart.  Does notice a 10lb weight gain over the last 3 months.  SOB - a lot more than usual.  Does bother more with activity.  States that she does keep congestion / sinus all year round.  Noticeably more winded.  Did not really want to go to ED if she didn't have to.  Has OV with Dr. Diona BrownerMcDowell scheduled for 11/09/2017.

## 2017-10-21 ENCOUNTER — Telehealth: Payer: Self-pay

## 2017-10-21 NOTE — Telephone Encounter (Signed)
RB please advise if we need to change medication for pt since it has been recalled.

## 2017-10-21 NOTE — Telephone Encounter (Signed)
Let her know that losartan has only been recalled from certain manufacturers..  Most pharmacies are not distributing the recall medication, but I recommend that she call her pharmacy to ensure that her particular version of the medication is not part of the recall.  If so then either she or the pharmacy need to call us so that we can provide an alternative.

## 2017-10-21 NOTE — Telephone Encounter (Signed)
Patient notified. Routed to PCP 

## 2017-10-21 NOTE — Telephone Encounter (Signed)
-----   Message from Eustace MooreLydia M Anderson, LPN sent at 1/4/78291/01/2018  9:30 AM EST -----   ----- Message ----- From: Jonelle SidleMcDowell, Samuel G, MD Sent: 10/21/2017   9:21 AM To: Eustace MooreLydia M Anderson, LPN  Results reviewed. LVEF remains normal at 65-70%. Aortic prosthesis also functioning normally with stable mean gradient, trivial aortic regurgitation noted. PASP normal. It could be that some of her recent symptoms are more pulmonary related than cardiac. A copy of this test should be forwarded to Ignatius SpeckingVyas, Dhruv B, MD.

## 2017-10-21 NOTE — Telephone Encounter (Signed)
Called pt and advised message from the provider. Pt understood and verbalized understanding. Nothing further is needed.    

## 2017-10-24 ENCOUNTER — Ambulatory Visit: Payer: Medicare Other | Admitting: Emergency Medicine

## 2017-10-24 ENCOUNTER — Encounter: Payer: Self-pay | Admitting: Emergency Medicine

## 2017-10-24 DIAGNOSIS — R05 Cough: Secondary | ICD-10-CM | POA: Diagnosis not present

## 2017-10-24 DIAGNOSIS — R053 Chronic cough: Secondary | ICD-10-CM

## 2017-10-24 DIAGNOSIS — R0600 Dyspnea, unspecified: Secondary | ICD-10-CM | POA: Diagnosis not present

## 2017-10-24 MED ORDER — ALBUTEROL SULFATE HFA 108 (90 BASE) MCG/ACT IN AERS: 2.0000 | INHALATION_SPRAY | RESPIRATORY_TRACT | 5 refills | Status: DC | PRN

## 2017-10-24 NOTE — Assessment & Plan Note (Signed)
Improved after stopping her ACE inhibitor, more aggressively treating GERD and allergic rhinitis.

## 2017-10-24 NOTE — Assessment & Plan Note (Signed)
Can happen suddenly and is not related necessarily to exercise.  Etiology is unclear.  Her echocardiogram was reassuring.  Her heart rate today is normal although the episodic nature could reflect a dysrhythmia.  Certainly she has underlying COPD, unfortunately has not been able to tolerate or benefit from a bronchodilator.  Still the episodic nature here in the acuity is difficult to interpret.  Question a component of panic attack.  We will perform a walking oximetry to rule out occult desaturation.  If present then we may decide to perform a CT scan of her chest although her chest x-ray is reassuring.  As above her echocardiogram did not show any evidence of LV dysfunction.  Her aortic valve replacement is working appropriately.  No evidence of pulmonary hypertension.  We will perform walking oximetry today. Try using albuterol 2 puffs when you experience shortness of breath Follow with Dr Delton CoombesByrum in 3 months or sooner if you have any problems.

## 2017-10-24 NOTE — Progress Notes (Signed)
b Subjective:    Patient ID: April Hull, female    DOB: 1941/05/17, 77 y.o.   MRN: [id number]  HPI 77 year old former smoker (20 pack years) with a history of COPD, chronic cough, seen here before by Dr Craige CottaSood, felt to have significant contributions from GERD, possibly also allergic rhinitis. She also is followed closely for aortic stenosis status post TAVR in June 2018. She had preoperative pulmonary function testing May 2018 that I personally reviewed. This shows severe obstruction, FEV1 1.13 L (58% predicted), hyperinflation, decreased diffusion capacity that does not fully correct to the normal range when adjusted for alveolar volume.   She is referred back today for evaluation of COPD and cough. She is on lisinopril 5 mg daily, pantoprazole 40 mg daily. Not currently on bronchodilators although she has been on Breo in the past without much change in her breathing. Today she reports that she is experiencing a lot of cough, worst at night when she goes to bed. Prod of clear mucous. Has about 2-3 spells a day, gets waked from sleep. She has some exertional SOB, notices it when doing sweeping, doing chores. She is able to shop, able to walk on flat ground. She hears wheeze, can happen at random, often assoc w her cough. Her reflux is better controlled - no breakthrough sx on PPI. She has rhinitis, occasionally congested. She takes benadryl qod for itching  ROV 10/24/17 --this is a follow-up visit for evaluation of chronic cough and obstructive lung disease. She also has hx AVR.  At her last visit I changed her lisinopril to losartan.  We continued pantoprazole and started an allergy regimen plus loratadine and fluticasone nasal spray).  I also started her on Spiriva Respimat to see if she would benefit, she did not fell that it changed her breathing but it did make her cough. Her cough is overall better.   She has experienced some intermittent episodes of acute dyspnea. This is worse than before. Can  happen at any time. Can be associated with panic and shakiness. She sometimes feels associated palpitations.  She had a reassuring TTE after these episodes started.  'b   Review of Systems  Constitutional: Negative for fever and unexpected weight change.  HENT: Negative for congestion, dental problem, ear pain, nosebleeds, postnasal drip, rhinorrhea, sinus pressure, sneezing, sore throat and trouble swallowing.   Eyes: Negative for redness and itching.  Respiratory: Positive for shortness of breath. Negative for cough, chest tightness and wheezing.   Cardiovascular: Negative for palpitations and leg swelling.  Gastrointestinal: Negative for nausea and vomiting.  Genitourinary: Negative for dysuria.  Musculoskeletal: Negative for joint swelling.  Skin: Negative for rash.  Neurological: Negative for headaches.  Hematological: Does not bruise/bleed easily.  Psychiatric/Behavioral: Negative for dysphoric mood. The patient is not nervous/anxious.    Past Medical History:  Diagnosis Date  . Anxiety    has had panic attacks, none reccently  . Aortic stenosis   . Colon polyp   . Complication of anesthesia    hard time waking up  . COPD (chronic obstructive pulmonary disease) (HCC)   . Diarrhea    since 2016- colon surgery  . Dyspnea    worse May 2018  . Essential hypertension, benign   . GERD (gastroesophageal reflux disease)    in 2016 not currently  . Heart murmur   . History of blood transfusion   . History of pneumonia   . Hypothyroidism   . Lyme disease 1993 ish   treated  radioactive iodine- caused thyroid to fail  . Mixed hyperlipidemia   . OA (osteoarthritis of the spine)   . Osteoporosis   . 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 Sigmoid diverticulitis      Family History  Problem Relation Age of Onset  . Congestive Heart Failure Mother   .  COPD Father   . Emphysema Father   . CAD Brother   . Breast cancer Sister   . Heart disease Sister   . Uterine cancer Daughter      Social History   Socioeconomic History  . Marital status: Widowed    Spouse name: Not on file  . Number of children: Not on file  . Years of education: Not on file  . Highest education level: Not on file  Social Needs  . Financial resource strain: Not on file  . Food insecurity - worry: Not on file  . Food insecurity - inability: Not on file  . Transportation needs - medical: Not on file  . Transportation needs - non-medical: Not on file  Occupational History  . Occupation: retired-worked at [employer]  Tobacco Use  . Smoking status: Former Smoker    Packs/day: 0.25    Years: 40.00    Pack years: 10.00    Types: Cigarettes    Start date: 08/04/1956    Last attempt to quit: 03/24/2017    Years since quitting: 0.5  . Smokeless tobacco: Never Used  . Tobacco comment: 2 -3  Substance and Sexual Activity  . Alcohol use: No    Alcohol/week: 0.0 oz  . Drug use: No  . Sexual activity: No  Other Topics Concern  . Not on file  Social History Narrative  . Not on file     Allergies  Allergen Reactions  . Bactrim [Sulfamethoxazole-Trimethoprim] Hives, Itching and Other (See Comments)    Irregular heart beat  . Plavix [Clopidogrel Bisulfate] Hives    Rash  . Codeine Palpitations    Dizziness, sweats      Outpatient Medications Prior to Visit  Medication Sig Dispense Refill  . atorvastatin (LIPITOR) 10 MG tablet Take 10 mg by mouth at bedtime.     . Calcium Carbonate-Vitamin D (CALCIUM 500 + D PO) Take 1 tablet by mouth every other day.    . fluticasone (FLONASE) 50 MCG/ACT nasal spray Place 2 sprays into both nostrils daily. 16 g 5  . levothyroxine (SYNTHROID, LEVOTHROID) 88 MCG tablet Take 88 mcg by mouth daily before breakfast.    . loratadine (CLARITIN) 10 MG tablet Take 1 tablet (10 mg total) by mouth daily. 30 tablet 5  .  losartan (COZAAR) 50 MG tablet Take 1 tablet (50 mg total) by mouth daily. 30 tablet 5  . Multiple Vitamin (MULTIVITAMIN WITH MINERALS) TABS tablet Take 1 tablet by mouth every other day.    . pantoprazole (PROTONIX) 40 MG tablet Take 40 mg by mouth daily.    . prasugrel (EFFIENT) 5 MG TABS tablet Take 5 mg by mouth daily.    Marland Kitchen lisinopril (PRINIVIL,ZESTRIL) 5 MG tablet Take 5 mg by mouth daily.    . Tiotropium Bromide Monohydrate (SPIRIVA RESPIMAT) 2.5 MCG/ACT AERS Inhale 2 puffs into the lungs daily. (Patient not taking: Reported on 10/24/2017) 2 Inhaler 0   No facility-administered medications prior to visit.         Objective:   Physical Exam Vitals:  10/24/17 1559  BP: 134/88  Pulse: 84  SpO2: 94%  Weight: 120 lb (54.4 kg)  Height: 5\' 3"  (1.6 m)   Gen: Pleasant, well-nourished, in no distress,  normal affect  ENT: No lesions,  mouth clear,  oropharynx clear, no postnasal drip  Neck: No JVD, no Stridor  Lungs: No use of accessory muscles, no crackles or wheezing   Cardiovascular: RRR, I do not hear a murmur  Musculoskeletal: No deformities, no cyanosis or clubbing  Neuro: alert, non focal  Skin: Warm, no lesions or rashes     Assessment & Plan:  Chronic cough Improved after stopping her ACE inhibitor, more aggressively treating GERD and allergic rhinitis.  Dyspnea Can happen suddenly and is not related necessarily to exercise.  Etiology is unclear.  Her echocardiogram was reassuring.  Her heart rate today is normal although the episodic nature could reflect a dysrhythmia.  Certainly she has underlying COPD, unfortunately has not been able to tolerate or benefit from a bronchodilator.  Still the episodic nature here in the acuity is difficult to interpret.  Question a component of panic attack.  We will perform a walking oximetry to rule out occult desaturation.  If present then we may decide to perform a CT scan of her chest although her chest x-ray is reassuring.  As  above her echocardiogram did not show any evidence of LV dysfunction.  Her aortic valve replacement is working appropriately.  No evidence of pulmonary hypertension.  We will perform walking oximetry today. Try using albuterol 2 puffs when you experience shortness of breath Follow with Dr Delton Coombes in 3 months or sooner if you have any problems.  Levy Pupa, MD, PhD 10/24/2017, 4:31 PM [facility] [phone] or if no answer [phone]

## 2017-10-24 NOTE — Patient Instructions (Addendum)
We will perform walking oximetry today. Please continue your losartan, pantoprazole, ranitidine, fluticasone nasal spray. Try using albuterol 2 puffs when you experience shortness of breath Follow with Dr Delton CoombesByrum in 3 months or sooner if you have any problems.

## 2017-11-05 ENCOUNTER — Other Ambulatory Visit: Payer: Self-pay | Admitting: Adult Health

## 2017-11-08 ENCOUNTER — Encounter: Payer: Self-pay | Admitting: Cardiology

## 2017-11-08 NOTE — Progress Notes (Signed)
Cardiology Office Note  Date: 11/09/2017   ID: April Hull, DOB 07/05/1941, MRN [id number]  PCP: Ignatius SpeckingVyas, Dhruv B, MD  Primary Cardiologist: Nona DellSamuel Margaretann Abate, MD   Chief Complaint  Patient presents with  . Aortic valve disease    History of Present Illness: April Hull is a 77 y.o. female most recently seen by Dr. Clifton JamesMcAlhany in July 2018 following TAVR. She called the office more recently reporting increased shortness of breath and weight gain. She was referred for a follow-up echocardiogram which showed preserved LVEF and stable aortic prosthetic function. She is here today for a follow-up visit, had interval assessment in Pulmonary and has had changes in her medications including a new MDI, discontinuation of ACE inhibitor with institution of Cozaar. She is feeling somewhat better. Still feels a "puffiness" in her ankles and hands sometimes.  She does not report any exertional chest pain or palpitations, no syncope. I reviewed her medications which are outlined below. She continues on Prasugrel. No bleeding problems.  Past Medical History:  Diagnosis Date  . Anxiety   . Aortic stenosis   . Colon polyp   . COPD (chronic obstructive pulmonary disease) (HCC)   . Diarrhea   . Essential hypertension   . GERD (gastroesophageal reflux disease)   . History of blood transfusion   . History of pneumonia   . Hypothyroidism   . Mixed hyperlipidemia   . OA (osteoarthritis of the spine)   . Osteoporosis   . 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 Sigmoid diverticulitis     Past Surgical History:  Procedure Laterality Date  . ABDOMINAL HYSTERECTOMY  1972  . APPENDECTOMY    . COLONOSCOPY  10/15/05  . COLOSTOMY N/A 11/27/2014   Procedure: COLOSTOMY;  Surgeon: Harriette Bouillonhomas Cornett, MD;  [facility];  Service: General;  Laterality: N/A;  . COLOSTOMY TAKEDOWN N/A  04/09/2015   Procedure: LAPAROSCOPIC ASSISTED COLOSTOMY CLOSURE; RIGID SIGMOIDOSCOPY;  Surgeon: Harriette Bouillonhomas Cornett, MD;  Location: [facility] OR;  Service: General;  Laterality: N/A;  . ESOPHAGOGASTRODUODENOSCOPY  10/15/05  . hammer toes Bilateral    6  . LAPAROSCOPIC SIGMOID COLECTOMY N/A 11/27/2014   Procedure: LAPAROSCOPIC ASSISTED SIGMOID COLECTOMY;  Surgeon: Harriette Bouillonhomas Cornett, MD;  Location: [facility] OR;  Service: General;  Laterality: N/A;  . LUMBAR DISC SURGERY     x 2  . RIGHT/LEFT HEART CATH AND CORONARY ANGIOGRAPHY N/A 03/04/2017   Procedure: Right/Left Heart Cath and Coronary Angiography;  Surgeon: Kathleene HazelMcAlhany, Christopher D, MD;  Location: [facility] INVASIVE CV LAB;  Service: Cardiovascular;  Laterality: N/A;  . ROTATOR CUFF REPAIR Left 2009  . TEE WITHOUT CARDIOVERSION N/A 04/05/2017   Procedure: TRANSESOPHAGEAL ECHOCARDIOGRAM (TEE);  Surgeon: Kathleene HazelMcAlhany, Christopher D, MD;  [facility];  Service: Open Heart Surgery;  Laterality: N/A;  . TRANSCATHETER AORTIC VALVE REPLACEMENT, TRANSFEMORAL N/A 04/05/2017   Procedure: TRANSCATHETER AORTIC VALVE REPLACEMENT, TRANSFEMORAL;  Surgeon: Kathleene HazelMcAlhany, Christopher D, MD;  Location: [facility] OR;  Service: Open Heart Surgery;  Laterality: N/A;    Current Outpatient Medications  Medication Sig Dispense Refill  . albuterol (PROVENTIL HFA;VENTOLIN HFA) 108 (90 Base) MCG/ACT inhaler Inhale 2 puffs into the lungs every 4 (four) hours as needed for wheezing or shortness of breath. 1 Inhaler 5  . atorvastatin (LIPITOR) 10 MG tablet Take 10 mg by mouth at bedtime.     . Calcium Carbonate-Vitamin D (CALCIUM 500 + D PO) Take  1 tablet by mouth every other day.    . fluticasone (FLONASE) 50 MCG/ACT nasal spray Place 2 sprays into both nostrils daily. 16 g 5  . levothyroxine (SYNTHROID, LEVOTHROID) 88 MCG tablet Take 88 mcg by mouth daily before breakfast.    . loratadine (CLARITIN) 10 MG tablet Take 1 tablet (10 mg total) by mouth daily. 30 tablet 5  . Multiple Vitamin (MULTIVITAMIN WITH  MINERALS) TABS tablet Take 1 tablet by mouth every other day.    . pantoprazole (PROTONIX) 40 MG tablet Take 40 mg by mouth daily.    . prasugrel (EFFIENT) 10 MG TABS tablet TAKE 1 TABLET BY MOUTH ONCE DAILY 30 tablet 6  . Tiotropium Bromide Monohydrate (SPIRIVA RESPIMAT) 2.5 MCG/ACT AERS Inhale 2 puffs into the lungs daily. 2 Inhaler 0   No current facility-administered medications for this visit.    Allergies:  Bactrim [sulfamethoxazole-trimethoprim]; Plavix [clopidogrel bisulfate]; and Codeine   Social History: The patient  reports that she quit smoking about 7 months ago. Her smoking use included cigarettes. She started smoking about 61 years ago. She has a 10.00 pack-year smoking history. she has never used smokeless tobacco. She reports that she does not drink alcohol or use drugs.   ROS:  Please see the history of present illness. Otherwise, complete review of systems is positive for occasional cough.  All other systems are reviewed and negative.   Physical Exam: VS:  BP 114/68   Pulse 88   Ht 5\' 3"  (1.6 m)   Wt 122 lb (55.3 kg)   SpO2 97%   BMI 21.61 kg/m , BMI Body mass index is 21.61 kg/m.  Wt Readings from Last 3 Encounters:  11/09/17 122 lb (55.3 kg)  10/24/17 120 lb (54.4 kg)  08/01/17 113 lb (51.3 kg)    General: Thin elderly woman, appears comfortable at rest. HEENT: Conjunctiva and lids normal, oropharynx clear. Neck: Supple, no elevated JVP or carotid bruits, no thyromegaly. Lungs: Diminished breath sounds without wheezing, nonlabored breathing at rest. Cardiac: Regular rate and rhythm, no S3, 1-2/6 systolic murmur, no pericardial rub. Abdomen: Soft, nontender, bowel sounds present. Extremities: Trace ankle edema, distal pulses 2+. Skin: Warm and dry. Musculoskeletal: No kyphosis. Neuropsychiatric: Alert and oriented x3, affect grossly appropriate.  ECG: I personally reviewed the tracing from 04/06/2009 which showed sinus rhythm with PAC, nonspecific ST  changes.  Recent Labwork: 03/31/2017: ALT 14; AST 21 04/13/2017: Hemoglobin 11.0; Platelets 187 04/18/2017: BUN 15; Creatinine, Ser 0.64; Magnesium 1.7; Potassium 4.3; Sodium 135     Component Value Date/Time   TRIG 129 12/02/2014 0215    Other Studies Reviewed Today:  Echocardiogram 10/20/2017: Study Conclusions  - Left ventricle: The cavity size was normal. Wall thickness was   increased increased in a pattern of mild to moderate LVH.   Systolic function was vigorous. The estimated ejection fraction   was in the range of 65% to 70%. Wall motion was normal; there   were no regional wall motion abnormalities. Doppler parameters   are consistent with abnormal left ventricular relaxation (grade 1   diastolic dysfunction). - Aortic valve: 26mm Sapien 3 bioprosthetic AVR in position. No   obvious paravalvular leak. There was trivial regurgitation. Mean   gradient (S): 6 mm Hg. - Mitral valve: Moderately calcified annulus. Moderately calcified   leaflets . There was mild regurgitation. - Right atrium: Central venous pressure (est): 3 mm Hg. - Atrial septum: No defect or patent foramen ovale was identified. - Tricuspid valve: There was mild  regurgitation. - Pulmonary arteries: PA peak pressure: 24 mm Hg (S). - Pericardium, extracardiac: There was no pericardial effusion.  Impressions:  - Mild to moderate LVH with LVEF 65-70% and grade 1 diastolic   dysfunction. Moderately calcified mitral annulus and leaflets   with mild mitral regurgitation. 26mm Sapien 3 bioprosthetic AVR   in position with trivial aortic regurgitation and stable mean   gradient of 6 mmHg. Mild tricuspid regurgitation with estimated   PASP .  Assessment and Plan:  1. Aortic stenosis status post TAVR as outlined above. Overall, she has been relatively stable with recent follow-up echocardiogram showing normal LVEF and adequate bioprosthetic AVR function. There is trivial aortic regurgitation which is not of  clinical significance and mean gradient of 6 no meters mercury. She sees Dr. Clifton James this summer.  2. Feeling of "puffiness" with mild ankle edema. Minimize salt in the diet. Do not plan to start standing diuretic in light of recent echocardiographic findings. Might consider as needed use of diuretic if symptoms worsen.  3. COPD with previous history of tobacco abuse. Recent visit with Dr. Delton Coombes noted.  4. Mixed hyperlipidemia, continues on Lipitor.  Current medicines were reviewed with the patient today.  Disposition: Follow-up in one year.  Signed, Jonelle Sidle, MD, [facility] 11/09/2017 1:11 PM    [facility] [address] [place], [place], [place] [address] Phone: [phone]; Fax: [phone]

## 2017-11-09 ENCOUNTER — Encounter: Payer: Self-pay | Admitting: Cardiology

## 2017-11-09 ENCOUNTER — Ambulatory Visit: Payer: Medicare Other | Admitting: Cardiology

## 2017-11-09 VITALS — BP 114/68 | HR 88 | Ht 63.0 in | Wt 122.0 lb

## 2017-11-09 DIAGNOSIS — Z952 Presence of prosthetic heart valve: Secondary | ICD-10-CM

## 2017-11-09 DIAGNOSIS — M25473 Effusion, unspecified ankle: Secondary | ICD-10-CM | POA: Diagnosis not present

## 2017-11-09 DIAGNOSIS — E782 Mixed hyperlipidemia: Secondary | ICD-10-CM

## 2017-11-09 DIAGNOSIS — J449 Chronic obstructive pulmonary disease, unspecified: Secondary | ICD-10-CM | POA: Diagnosis not present

## 2017-11-09 NOTE — Patient Instructions (Signed)

## 2017-11-17 ENCOUNTER — Telehealth: Payer: Self-pay | Admitting: Emergency Medicine

## 2017-11-17 MED ORDER — ALBUTEROL SULFATE HFA 108 (90 BASE) MCG/ACT IN AERS: 2.0000 | INHALATION_SPRAY | RESPIRATORY_TRACT | 6 refills | Status: DC | PRN

## 2017-11-17 NOTE — Telephone Encounter (Signed)
[employer], spoke with Margorie who stated a PA needed to be done for the proventil hfa.  While waiting for Margorie to tell me other info that needed to be added, found formulary list which showed the Ventolin HFA was a preferred alternative to the proventil.  Discontinued the proventil from pt's med list and sent an rx in for ventolin and specified to pharmacy that we needed to make sure it was the ventolin they refilled for pt.  Nothing further needed at this current time.

## 2017-11-22 ENCOUNTER — Telehealth: Payer: Self-pay | Admitting: Emergency Medicine

## 2017-11-22 NOTE — Telephone Encounter (Signed)
Tried calling number provided 3 times. This is not a working number for [employer]. Will close encounter and await call back.

## 2017-12-20 ENCOUNTER — Other Ambulatory Visit: Payer: Self-pay | Admitting: *Deleted

## 2017-12-20 MED ORDER — LOSARTAN POTASSIUM 50 MG PO TABS: 50.0000 mg | ORAL_TABLET | Freq: Every day | ORAL | 1 refills | Status: DC

## 2017-12-28 ENCOUNTER — Telehealth: Payer: Self-pay | Admitting: Emergency Medicine

## 2017-12-28 NOTE — Telephone Encounter (Signed)
Spoke with patient. She stated that RB switched her from lisinopril 5mg  to losartan 50mg . Due to the recent recall, [facility] in [place] no longer has this medication.   She wants to know if she should go back to the lisinopril or is there anything else she can take?   RB, please advise. Thanks!

## 2017-12-29 MED ORDER — VALSARTAN 160 MG PO TABS: 160.0000 mg | ORAL_TABLET | Freq: Every day | ORAL | 3 refills | Status: DC

## 2017-12-29 NOTE — Telephone Encounter (Signed)
Patient aware and medication sent in. 

## 2017-12-29 NOTE — Telephone Encounter (Signed)
Can we try valsartan 160mg  qd

## 2018-02-07 ENCOUNTER — Other Ambulatory Visit: Payer: Self-pay | Admitting: *Deleted

## 2018-02-07 MED ORDER — VALSARTAN 160 MG PO TABS: 160.0000 mg | ORAL_TABLET | Freq: Every day | ORAL | 0 refills | Status: DC

## 2018-02-15 ENCOUNTER — Encounter: Payer: Self-pay | Admitting: Emergency Medicine

## 2018-02-15 ENCOUNTER — Ambulatory Visit: Payer: Medicare Other | Admitting: Emergency Medicine

## 2018-02-15 DIAGNOSIS — R05 Cough: Secondary | ICD-10-CM | POA: Diagnosis not present

## 2018-02-15 DIAGNOSIS — J449 Chronic obstructive pulmonary disease, unspecified: Secondary | ICD-10-CM

## 2018-02-15 DIAGNOSIS — R058 Other specified cough: Secondary | ICD-10-CM

## 2018-02-15 NOTE — Patient Instructions (Signed)
Please finish your Cipro as planned. Keep Ventolin available to use 2 puffs if needed for shortness of breath, wheezing, chest tightness. Continue Flonase nasal spray, 2 sprays each nostril once a day. Continue Claritin 10 mg once a day. Please call our office if you are not improving after taking the antibiotics as above. Follow with Dr Delton Coombes in 6 months or sooner if you have any problems

## 2018-02-15 NOTE — Assessment & Plan Note (Signed)
Did not benefit from LAMA, will continue albuterol prn.

## 2018-02-15 NOTE — Progress Notes (Signed)
b Subjective:    Patient ID: April Hull, female    DOB: [birth year], 77 y.o.   MRN: [id number]  HPI 77 year-old former smoker (20 pack years) with a history of COPD, chronic cough, seen here before by Dr Craige Cotta, felt to have significant contributions from GERD, possibly also allergic rhinitis. She also is followed closely for aortic stenosis status post TAVR in June 2018. She had preoperative pulmonary function testing May 2018 that I personally reviewed. This shows severe obstruction, FEV1 1.13 L (58% predicted), hyperinflation, decreased diffusion capacity that does not fully correct to the normal range when adjusted for alveolar volume.   She is referred back today for evaluation of COPD and cough. She is on lisinopril 5 mg daily, pantoprazole 40 mg daily. Not currently on bronchodilators although she has been on Breo in the past without much change in her breathing. Today she reports that she is experiencing a lot of cough, worst at night when she goes to bed. Prod of clear mucous. Has about 2-3 spells a day, gets waked from sleep. She has some exertional SOB, notices it when doing sweeping, doing chores. She is able to shop, able to walk on flat ground. She hears wheeze, can happen at random, often assoc w her cough. Her reflux is better controlled - no breakthrough sx on PPI. She has rhinitis, occasionally congested. She takes benadryl qod for itching  ROV 10/24/17 --this is a follow-up visit for evaluation of chronic cough and obstructive lung disease. She also has hx AVR.  At her last visit I changed her lisinopril to losartan.  We continued pantoprazole and started an allergy regimen plus loratadine and fluticasone nasal spray).  I also started her on Spiriva Respimat to see if she would benefit, she did not fell that it changed her breathing but it did make her cough. Her cough is overall better.   She has experienced some intermittent episodes of acute dyspnea. This is worse than before. Can  happen at any time. Can be associated with panic and shakiness. She sometimes feels associated palpitations.  She had a reassuring TTE after these episodes started.    ROV 02/15/18 --this is a follow-up visit for history of tobacco use and COPD, chronic cough, some periods of episodic dyspnea.  She returns today reporting that her breathing and cough had been doing better until about 1 week ago when she had more clear rhinitis, cough, clear sputum. She was started on cipro on Friday. She has improved some, cough better, breathing a bit better but still with dyspnea. Ventolin helps the breathing. She has tessalon perles, has helped her. She is on flonase and loratadine.    Review of Systems  Constitutional: Negative for fever and unexpected weight change.  HENT: Negative for congestion, dental problem, ear pain, nosebleeds, postnasal drip, rhinorrhea, sinus pressure, sneezing, sore throat and trouble swallowing.   Eyes: Negative for redness and itching.  Respiratory: Positive for shortness of breath. Negative for cough, chest tightness and wheezing.   Cardiovascular: Negative for palpitations and leg swelling.  Gastrointestinal: Negative for nausea and vomiting.  Genitourinary: Negative for dysuria.  Musculoskeletal: Negative for joint swelling.  Skin: Negative for rash.  Neurological: Negative for headaches.  Hematological: Does not bruise/bleed easily.  Psychiatric/Behavioral: Negative for dysphoric mood. The patient is not nervous/anxious.    Past Medical History:  Diagnosis Date  . Anxiety   . Aortic stenosis   . Colon polyp   . COPD (chronic obstructive pulmonary disease) (HCC)   .  Diarrhea   . Essential hypertension   . GERD (gastroesophageal reflux disease)   . History of blood transfusion   . History of pneumonia   . Hypothyroidism   . Mixed hyperlipidemia   . OA (osteoarthritis of the spine)   . Osteoporosis   . 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 Sigmoid diverticulitis      Family History  Problem Relation Age of Onset  . Congestive Heart Failure Mother   . COPD Father   . Emphysema Father   . CAD Brother   . Breast cancer Sister   . Heart disease Sister   . Uterine cancer Daughter      Social History   Socioeconomic History  . Marital status: Widowed    Spouse name: Not on file  . Number of children: Not on file  . Years of education: Not on file  . Highest education level: Not on file  Occupational History  . Occupation: retired-worked at [employer]  Social Needs  . Financial resource strain: Not on file  . Food insecurity:    Worry: Not on file    Inability: Not on file  . Transportation needs:    Medical: Not on file    Non-medical: Not on file  Tobacco Use  . Smoking status: Former Smoker    Packs/day: 0.25    Years: 40.00    Pack years: 10.00    Types: Cigarettes    Start date: 08/04/1956    Last attempt to quit: 03/24/2017    Years since quitting: 0.8  . Smokeless tobacco: Never Used  . Tobacco comment: 2 -3  Substance and Sexual Activity  . Alcohol use: No    Alcohol/week: 0.0 oz  . Drug use: No  . Sexual activity: Never  Lifestyle  . Physical activity:    Days per week: Not on file    Minutes per session: Not on file  . Stress: Not on file  Relationships  . Social connections:    Talks on phone: Not on file    Gets together: Not on file    Attends religious service: Not on file    Active member of club or organization: Not on file    Attends meetings of clubs or organizations: Not on file    Relationship status: Not on file  . Intimate partner violence:    Fear of current or ex partner: Not on file    Emotionally abused: Not on file    Physically abused: Not on file    Forced sexual activity: Not on file  Other Topics Concern  . Not on file  Social History Narrative  . Not on  file     Allergies  Allergen Reactions  . Bactrim [Sulfamethoxazole-Trimethoprim] Hives, Itching and Other (See Comments)    Irregular heart beat  . Plavix [Clopidogrel Bisulfate] Hives    Rash  . Codeine Palpitations    Dizziness, sweats      Outpatient Medications Prior to Visit  Medication Sig Dispense Refill  . albuterol (PROVENTIL HFA;VENTOLIN HFA) 108 (90 Base) MCG/ACT inhaler Inhale 2 puffs into the lungs every 4 (four) hours as needed for wheezing or shortness of breath. 1 Inhaler 6  . atorvastatin (LIPITOR) 10 MG tablet Take 10 mg by mouth at bedtime.     . benzonatate (TESSALON) 100 MG capsule Take 1 capsule by mouth 3 (three)  times daily as needed.  0  . Calcium Carbonate-Vitamin D (CALCIUM 500 + D PO) Take 1 tablet by mouth every other day.    . ciprofloxacin (CIPRO) 250 MG tablet Take 1 tablet by mouth 2 (two) times daily. Finishes on Friday5/12/2017  0  . fluticasone (FLONASE) 50 MCG/ACT nasal spray Place 2 sprays into both nostrils daily. 16 g 5  . levothyroxine (SYNTHROID, LEVOTHROID) 88 MCG tablet Take 88 mcg by mouth daily before breakfast.    . loratadine (CLARITIN) 10 MG tablet Take 1 tablet (10 mg total) by mouth daily. 30 tablet 5  . Multiple Vitamin (MULTIVITAMIN WITH MINERALS) TABS tablet Take 1 tablet by mouth every other day.    . pantoprazole (PROTONIX) 40 MG tablet Take 40 mg by mouth daily.    . prasugrel (EFFIENT) 10 MG TABS tablet TAKE 1 TABLET BY MOUTH ONCE DAILY 30 tablet 6  . valsartan (DIOVAN) 160 MG tablet Take 1 tablet (160 mg total) by mouth daily. 90 tablet 0  . Tiotropium Bromide Monohydrate (SPIRIVA RESPIMAT) 2.5 MCG/ACT AERS Inhale 2 puffs into the lungs daily. (Patient not taking: Reported on 02/15/2018) 2 Inhaler 0  . losartan (COZAAR) 50 MG tablet Take 1 tablet (50 mg total) by mouth daily. (Patient not taking: Reported on 02/15/2018) 90 tablet 1   No facility-administered medications prior to visit.         Objective:   Physical  Exam Vitals:   02/15/18 1647  BP: 130/70  Pulse: 85  SpO2: 94%  Weight: 53.9 kg (118 lb 12.8 oz)  Height:  (1.6 m)   Gen: Pleasant, well-nourished, in no distress,  normal affect, freq coughing today  ENT: No lesions,  mouth clear,  oropharynx clear, some nasal congestion  Neck: No JVD, no Stridor  Lungs: No use of accessory muscles, no crackles or wheezing   Cardiovascular: RRR, I do not hear a murmur  Musculoskeletal: No deformities, no cyanosis or clubbing  Neuro: alert, non focal  Skin: Warm, no lesions or rashes     Assessment & Plan:  Upper airway cough syndrome Recent flare of her UA cough after allergies increased, more pollen exposure. She has been treated with cipro in case she has a bronchitis. We will complete this, continue to treat her allergies, GERD. If the cough does not resolve after all of the above she may need a pred taper to resolve it.   COPD (chronic obstructive pulmonary disease) Did not benefit from LAMA, will continue albuterol prn.   Levy Pupa, MD, PhD 02/15/2018, 5:17 PM [facility] [phone] or if no answer [phone]

## 2018-02-15 NOTE — Assessment & Plan Note (Signed)
Recent flare of her UA cough after allergies increased, more pollen exposure. She has been treated with cipro in case she has a bronchitis. We will complete this, continue to treat her allergies, GERD. If the cough does not resolve after all of the above she may need a pred taper to resolve it.

## 2018-02-16 ENCOUNTER — Telehealth: Payer: Self-pay | Admitting: Emergency Medicine

## 2018-02-16 MED ORDER — TIOTROPIUM BROMIDE MONOHYDRATE 2.5 MCG/ACT IN AERS: 2.0000 | INHALATION_SPRAY | Freq: Every day | RESPIRATORY_TRACT | 5 refills | Status: DC

## 2018-02-16 NOTE — Telephone Encounter (Signed)
Pt requesting spiriva rx.  This has been sent to preferred pharmacy.  Nothing further needed.

## 2018-02-20 ENCOUNTER — Encounter: Payer: Self-pay | Admitting: *Deleted

## 2018-02-20 ENCOUNTER — Telehealth: Payer: Self-pay | Admitting: *Deleted

## 2018-02-20 DIAGNOSIS — I350 Nonrheumatic aortic (valve) stenosis: Secondary | ICD-10-CM

## 2018-02-20 DIAGNOSIS — Z952 Presence of prosthetic heart valve: Secondary | ICD-10-CM

## 2018-02-20 NOTE — Telephone Encounter (Signed)
I spoke with pt and scheduled her to see K. Janee Morn, [place] on June 19,2019 at 2:00.  Echo to be done at 1:00 the same day.

## 2018-02-20 NOTE — Telephone Encounter (Signed)
I placed call to pt to schedule echo and one year TAVR follow up appointments.  Left message to call office.

## 2018-03-05 ENCOUNTER — Other Ambulatory Visit: Payer: Self-pay | Admitting: Emergency Medicine

## 2018-04-04 NOTE — Progress Notes (Signed)
[facility]   MULTIDISCIPLINARY HEART VALVE CLINIC                                       Cardiology Office Note    Date:  04/05/2018   ID:  April Hull, DOB 1941-06-02, MRN [id number]  PCP:  April Specking, MD  Cardiologist:  Dr. Diona Browner / Dr. Clifton James & Dr. Cornelius Moras (TAVR)  CC: 1 year follow up s/p TAVR  History of Present Illness:  April Hull is a 77 y.o. female with a history of COPD, HTN, hypothyroidism, HLD, previous heavy tobacco abuse, and severe AS s/p TAVR (04/05/17) who presents to clinic for 1 year follow up.   Her AS was followed by Dr. Diona Browner and she developed progressively worsening dyspnea and dizziness. She underwent TAVR on 04/05/17 with placement of a 26 mm Sapien 3 bioprosthetic aortic valve from the right transfemoral approach. Her procedure and hospital course was uneventful. Echo post procedure with normal LV function. The valve was working well. She did not tolerate Plavix due to a rash and itching. She was placed ASA and Effient. ASA was discontinued at her 1 month appointment. Echo at that time showed normal LV function and normal function of the bioprosthetic AV with a mean gradient of 7 mm Hg.   She had some worsening shortness of breath in 10/2017. Repeat echo was stable. Dr. Diona Browner felt dyspnea was more related to pulmonary issues.   Today she present to clinic for follow up. No CP. She has chronic shortness of breath that is unchanged. She gets shortness of breath with walking on level ground. No LE edema, orthopnea or PND. She has occasional dizziness but no syncope. She mostly notices this when standing up from sitting. No blood in stool or urine. No palpitations.     Past Medical History:  Diagnosis Date  . Anxiety   . Aortic stenosis   . Colon polyp   . COPD (chronic obstructive pulmonary disease) (HCC)   . Diarrhea   . Essential hypertension   . GERD (gastroesophageal reflux disease)   . History of blood transfusion   . History  of pneumonia   . Hypothyroidism   . Mixed hyperlipidemia   . OA (osteoarthritis of the spine)   . Osteoporosis   . 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 Sigmoid diverticulitis     Past Surgical History:  Procedure Laterality Date  . ABDOMINAL HYSTERECTOMY  1972  . APPENDECTOMY    . COLONOSCOPY  10/15/05  . COLOSTOMY N/A 11/27/2014   Procedure: COLOSTOMY;  Surgeon: April Bouillon, MD;  Location: [facility] OR;  Service: General;  Laterality: N/A;  . COLOSTOMY TAKEDOWN N/A 04/09/2015   Procedure: LAPAROSCOPIC ASSISTED COLOSTOMY CLOSURE; RIGID SIGMOIDOSCOPY;  Surgeon: April Bouillon, MD;  Location: [facility] OR;  Service: General;  Laterality: N/A;  . ESOPHAGOGASTRODUODENOSCOPY  10/15/05  . hammer toes Bilateral    6  . LAPAROSCOPIC SIGMOID COLECTOMY N/A 11/27/2014   Procedure: LAPAROSCOPIC ASSISTED SIGMOID COLECTOMY;  Surgeon: April Bouillon, MD;  Location: [facility] OR;  Service: General;  Laterality: N/A;  . LUMBAR DISC SURGERY     x 2  . RIGHT/LEFT HEART CATH AND CORONARY ANGIOGRAPHY N/A 03/04/2017   Procedure: Right/Left Heart Cath and Coronary Angiography;  Surgeon: Kathleene Hazel, MD;  Location: [facility] INVASIVE CV LAB;  Service: Cardiovascular;  Laterality: N/A;  . ROTATOR CUFF REPAIR Left 2009  . TEE WITHOUT CARDIOVERSION N/A 04/05/2017   Procedure: TRANSESOPHAGEAL ECHOCARDIOGRAM (TEE);  Surgeon: Kathleene Hazel, MD;  Location: [facility] OR;  Service: Open Heart Surgery;  Laterality: N/A;  . TRANSCATHETER AORTIC VALVE REPLACEMENT, TRANSFEMORAL N/A 04/05/2017   Procedure: TRANSCATHETER AORTIC VALVE REPLACEMENT, TRANSFEMORAL;  Surgeon: Kathleene Hazel, MD;  Location: [facility] OR;  Service: Open Heart Surgery;  Laterality: N/A;    Current Medications: Outpatient Medications Prior to Visit  Medication Sig Dispense Refill  . albuterol (PROVENTIL HFA;VENTOLIN HFA) 108 (90  Base) MCG/ACT inhaler Inhale 2 puffs into the lungs every 4 (four) hours as needed for wheezing or shortness of breath. 1 Inhaler 6  . atorvastatin (LIPITOR) 10 MG tablet Take 10 mg by mouth at bedtime.     . benzonatate (TESSALON) 100 MG capsule Take 1 capsule by mouth 3 (three) times daily as needed.  0  . Calcium Carbonate-Vitamin D (CALCIUM 500 + D PO) Take 1 tablet by mouth every other day.    Burman Blacksmith ALLERGY RELIEF 10 MG tablet TAKE 1 TABLET BY MOUTH ONCE DAILY 30 tablet 5  . fluticasone (FLONASE) 50 MCG/ACT nasal spray Place 2 sprays into both nostrils daily. 16 g 5  . levothyroxine (SYNTHROID, LEVOTHROID) 88 MCG tablet Take 88 mcg by mouth daily before breakfast.    . Multiple Vitamin (MULTIVITAMIN WITH MINERALS) TABS tablet Take 1 tablet by mouth every other day.    . pantoprazole (PROTONIX) 40 MG tablet Take 40 mg by mouth daily.    . Tiotropium Bromide Monohydrate (SPIRIVA RESPIMAT) 2.5 MCG/ACT AERS Inhale 2 puffs into the lungs daily. 1 Inhaler 5  . valsartan (DIOVAN) 160 MG tablet Take 1 tablet (160 mg total) by mouth daily. 90 tablet 0  . prasugrel (EFFIENT) 10 MG TABS tablet TAKE 1 TABLET BY MOUTH ONCE DAILY 30 tablet 6  . ciprofloxacin (CIPRO) 250 MG tablet Take 1 tablet by mouth 2 (two) times daily. Finishes on Friday5/12/2017  0   No facility-administered medications prior to visit.      Allergies:   Bactrim [sulfamethoxazole-trimethoprim]; Plavix [clopidogrel bisulfate]; and Codeine   Social History   Socioeconomic History  . Marital status: Widowed    Spouse name: Not on file  . Number of children: Not on file  . Years of education: Not on file  . Highest education level: Not on file  Occupational History  . Occupation: retired-worked at [employer]  Social Needs  . Financial resource strain: Not on file  . Food insecurity:    Worry: Not on file    Inability: Not on file  . Transportation needs:    Medical: Not on file    Non-medical: Not on file  Tobacco Use   . Smoking status: Former Smoker    Packs/day: 0.25    Years: 40.00    Pack years: 10.00    Types: Cigarettes    Start date: 08/04/1956    Last attempt to quit: 03/24/2017    Years since quitting: 1.0  . Smokeless tobacco: Never Used  . Tobacco comment: 2 -3  Substance and Sexual Activity  . Alcohol use: No    Alcohol/week: 0.0 oz  . Drug use: No  . Sexual activity: Never  Lifestyle  . Physical activity:    Days per week: Not on file    Minutes per session: Not on file  . Stress: Not on file  Relationships  . Social connections:    Talks on phone: Not on file    Gets together: Not on file    Attends religious service: Not on file    Active member of club or organization: Not on file    Attends meetings of clubs or organizations: Not on file    Relationship status: Not on file  Other Topics Concern  . Not on file  Social History Narrative  . Not on file     Family History:  The patient's family history includes Breast cancer in her sister; CAD in her brother; COPD in her father; Congestive Heart Failure in her mother; Emphysema in her father; Heart disease in her sister; Uterine cancer in her daughter.      ROS:   Please see the history of present illness.    ROS All other systems reviewed and are negative.   PHYSICAL EXAM:   VS:  BP 128/82   Ht 5\' 3"  (1.6 m)   Wt 125 lb 6.4 oz (56.9 kg)   BMI 22.21 kg/m    GEN: Well nourished, well developed, in no acute distress  HEENT: normal  Neck: no JVD, carotid bruits, or masses Cardiac: RRR; no murmurs, rubs, or gallops,no edema  Respiratory:  clear to auscultation bilaterally, normal work of breathing GI: soft, nontender, nondistended, + BS MS: no deformity or atrophy  Skin: warm and dry, no rash Neuro:  Alert and Oriented x 3, Strength and sensation are intact Psych: euthymic mood, full affect   Wt Readings from Last 3 Encounters:  04/05/18 125 lb 6.4 oz (56.9 kg)  02/15/18 118 lb 12.8 oz (53.9 kg)  11/09/17 122  lb (55.3 kg)      Studies/Labs Reviewed:   EKG:  EKG is ordered today.  The ekg ordered today demonstrates NSR HR 74  Recent Labs: 04/13/2017: Hemoglobin 11.0; Platelets 187 04/18/2017: BUN 15; Creatinine, Ser 0.64; Magnesium 1.7; Potassium 4.3; Sodium 135   Lipid Panel    Component Value Date/Time   TRIG 129 12/02/2014 0215    Additional studies/ records that were reviewed today include:  TAVR OPERATIVE NOTE   Date of Procedure:                04/05/2017  Preoperative Diagnosis:      Severe Aortic Stenosis  Procedure:        Transcatheter Aortic Valve Replacement - Percutaneous Left Transfemoral Approach             Edwards Sapien 3 THV (size 26 mm, model # 9600TFX, serial # [id number])              Co-Surgeons:                        Salvatore Decentlarence H. Cornelius Moraswen, MD and Verne Carrowhristopher McAlhany, MD  Pre-operative Echo Findings: ? Severe aortic stenosis ? Normal LV systolic function  Post-operative Echo Findings: ? Trivial paravalvular leak ? Normal left ventricular systolic function  ___________________   2D ECHO 05/13/18 (1 month s/p TAVR)  Study Conclusions - Left ventricle: The cavity size was normal. There was moderate   concentric hypertrophy. Systolic function was normal. The   estimated ejection fraction was in the range of 60% to 65%. Wall   motion was normal; there were no regional wall motion   abnormalities. There was an increased relative contribution of   atrial contraction to ventricular filling. Doppler parameters are   consistent with abnormal left ventricular relaxation (  grade 1   diastolic dysfunction). - Aortic valve: S/P 26mm Sapien 3 bioprosthetic AVR (TAVR) well   seated with normal function. There is no perivavlular AR. Mean   gradient (S): 7 mm Hg. Peak gradient (S): 12 mm Hg. - Mitral valve: Calcified annulus. Moderate diffuse calcification   of the anterior leaflet. There was mild regurgitation. - Atrial septum: There was increased thickness of the  septum,   consistent with lipomatous hypertrophy.  ___________________  2D ECHO 04/05/18 (1 year s/p TAVR)  - HPI and indications: S/p TAVR (26mm Sapien bioprostethic valve). - Left ventricle: The cavity size was normal. There was mild focal   basal hypertrophy of the septum. Systolic function was vigorous.   The estimated ejection fraction was in the range of 65% to 70%.   Wall motion was normal; there were no regional wall motion   abnormalities. - Aortic valve: A TAVR bioprosthesis was present and functioning   normally. Peak velocity (S): 213 cm/s. Mean gradient (S): 10 mm   Hg. - Mitral valve: Moderately calcified annulus. There was systolic   anterior motion of the chordal structures. There was mild   regurgitation. - Left atrium: The atrium was mildly dilated. - Tricuspid valve: There was trivial regurgitation.   ASSESSMENT & PLAN:   Severe AS s/p TAVR: 2D ECHO today shows EF65% with normally functioning TAVR valve with no PVL and mean gradient 10 mm Hg. She has NHYA class II symptoms. SBE prophylaxis discussed. She will call us before she gets her dentures refitted for Abx. I Will stop Effient and start a baby aspirin.  HTN: BP well controlled today  HLD: continue statin   COPD: followed by Dr. Delton Coombes   Medication Adjustments/Labs and Tests Ordered: Current medicines are reviewed at length with the patient today.  Concerns regarding medicines are outlined above.  Medication changes, Labs and Tests ordered today are listed in the Patient Instructions below. Patient Instructions  Medication Instructions:  Your physician has recommended you make the following change in your medication:  Stop Effient. Start aspirin 81 mg by mouth daily.   Labwork: none  Testing/Procedures: none  Follow-Up: Follow up with Dr. Diona Browner as planned  Any Other Special Instructions Will Be Listed Below (If Applicable).  Your physician discussed the importance of taking an antibiotic  prior to any dental, gastrointestinal, genitourinary procedures to prevent damage to the heart valves from infection. You were given a prescription for an antibiotic based on current SBE prophylaxis guidelines.      If you need a refill on your cardiac medications before your next appointment, please call your pharmacy.      Signed, Cline Crock, PA-C  04/05/2018 8:31 PM    [facility] [address] [place], [place], [place]  [address] Phone: [phone]; Fax: [phone]

## 2018-04-05 ENCOUNTER — Other Ambulatory Visit: Payer: Self-pay

## 2018-04-05 ENCOUNTER — Ambulatory Visit (HOSPITAL_COMMUNITY): Payer: Medicare Other | Attending: Cardiology

## 2018-04-05 ENCOUNTER — Ambulatory Visit: Payer: Medicare Other | Admitting: Physician Assistant

## 2018-04-05 ENCOUNTER — Encounter: Payer: Self-pay | Admitting: Physician Assistant

## 2018-04-05 VITALS — BP 128/82 | Ht 63.0 in | Wt 125.4 lb

## 2018-04-05 DIAGNOSIS — J449 Chronic obstructive pulmonary disease, unspecified: Secondary | ICD-10-CM | POA: Diagnosis not present

## 2018-04-05 DIAGNOSIS — E785 Hyperlipidemia, unspecified: Secondary | ICD-10-CM

## 2018-04-05 DIAGNOSIS — I10 Essential (primary) hypertension: Secondary | ICD-10-CM | POA: Diagnosis not present

## 2018-04-05 DIAGNOSIS — Z952 Presence of prosthetic heart valve: Secondary | ICD-10-CM | POA: Diagnosis present

## 2018-04-05 DIAGNOSIS — Z87891 Personal history of nicotine dependence: Secondary | ICD-10-CM | POA: Insufficient documentation

## 2018-04-05 DIAGNOSIS — I350 Nonrheumatic aortic (valve) stenosis: Secondary | ICD-10-CM

## 2018-04-05 DIAGNOSIS — I340 Nonrheumatic mitral (valve) insufficiency: Secondary | ICD-10-CM | POA: Diagnosis not present

## 2018-04-05 MED ORDER — ASPIRIN EC 81 MG PO TBEC: 81.0000 mg | DELAYED_RELEASE_TABLET | Freq: Every day | ORAL | 3 refills | Status: DC

## 2018-04-05 NOTE — Patient Instructions (Signed)
Medication Instructions:  Your physician has recommended you make the following change in your medication:  Stop Effient. Start aspirin 81 mg by mouth daily.   Labwork: none  Testing/Procedures: none  Follow-Up: Follow up with Dr. Diona BrownerMcDowell as planned  Any Other Special Instructions Will Be Listed Below (If Applicable).  Your physician discussed the importance of taking an antibiotic prior to any dental, gastrointestinal, genitourinary procedures to prevent damage to the heart valves from infection. You were given a prescription for an antibiotic based on current SBE prophylaxis guidelines.      If you need a refill on your cardiac medications before your next appointment, please call your pharmacy.

## 2018-04-06 IMAGING — CR DG CHEST 1V PORT
1 series · 1 of 1 positions shown · non-contrast
Comparison: 03/31/2017 and earlier.

CLINICAL DATA: 75-year-old female status post TAVR today.

EXAM:
PORTABLE CHEST 1 VIEW

[portable]
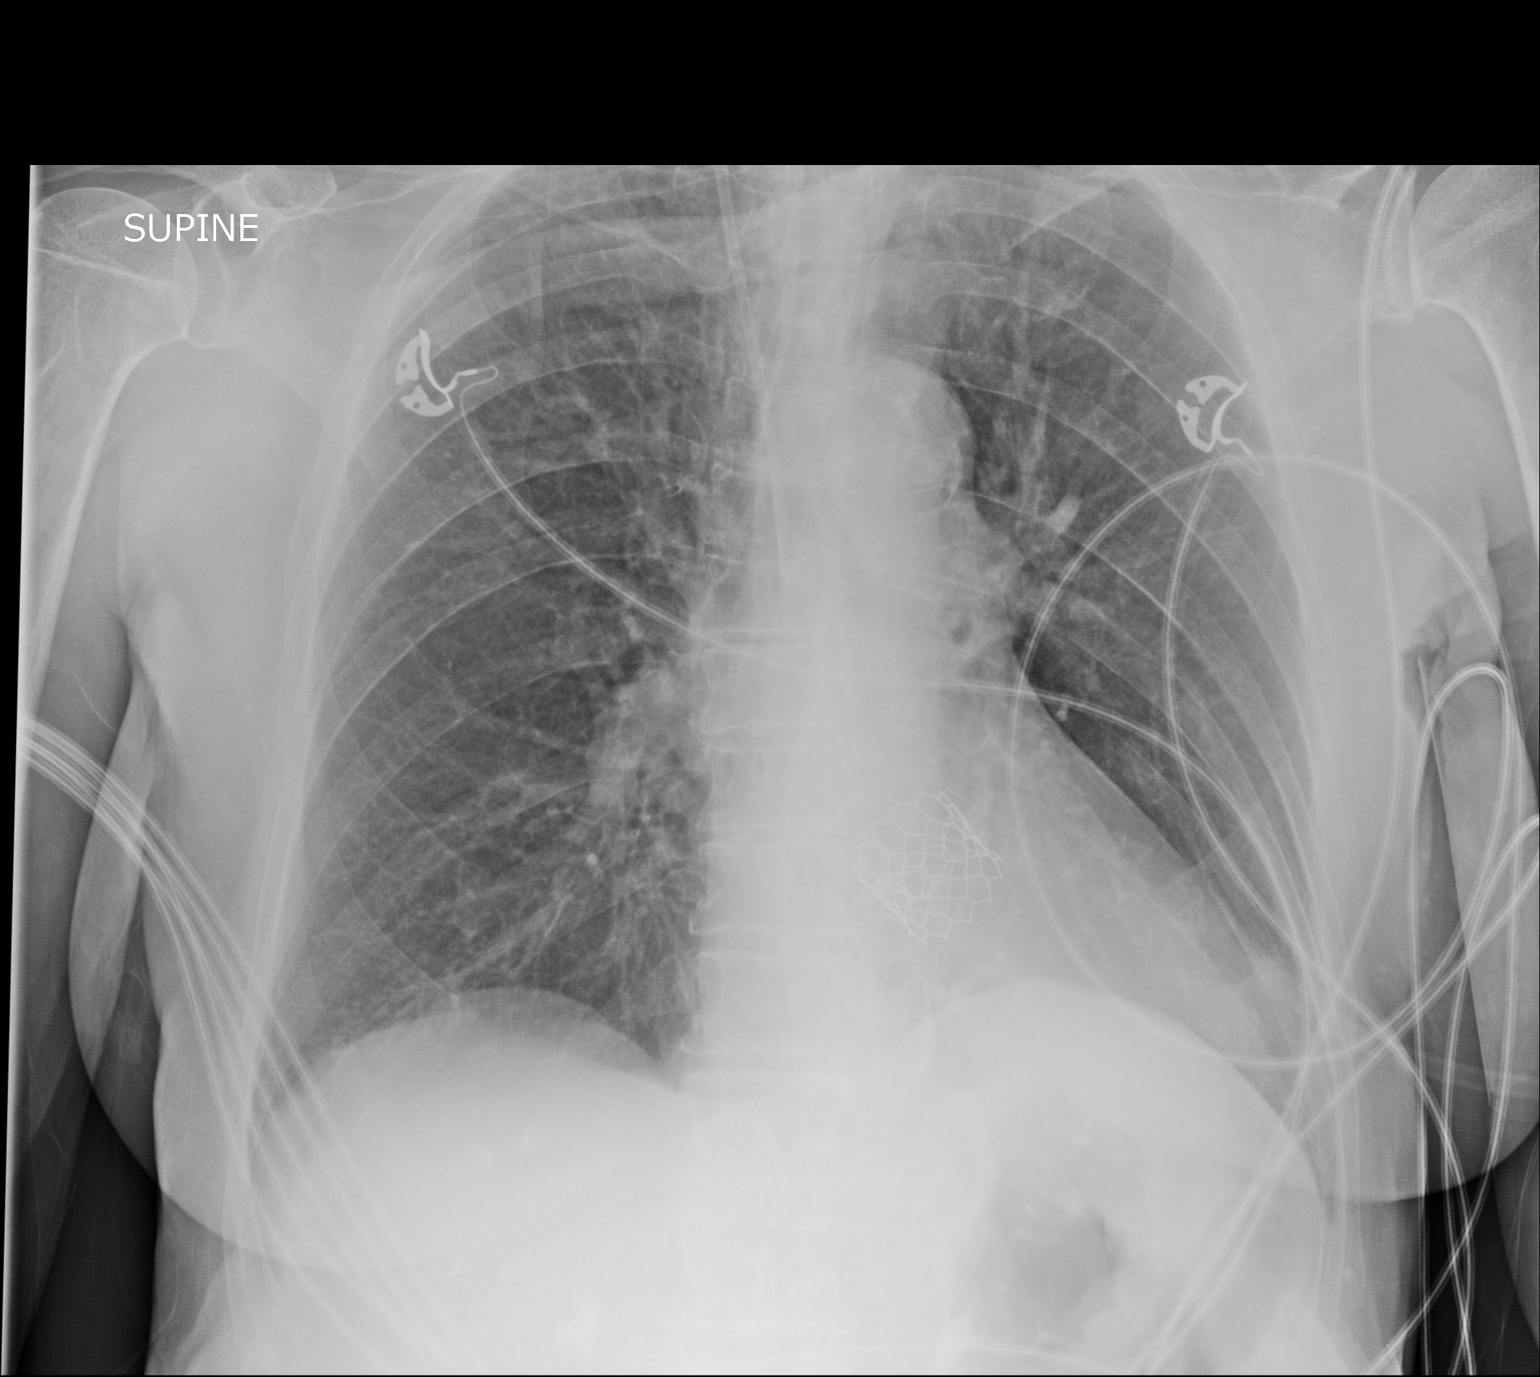

[1 of 1 positions shown; findings below may reference images not displayed]

FINDINGS: Portable AP supine view at 2839 hours. Right IJ approach central
line tip projects just below the carina. Prosthetic aortic valve
projects over the cardiac silhouette. Mediastinal contours remain
stable. Large lung volumes. Increased pulmonary vascularity but no
overt edema. No pneumothorax, pleural effusion or confluent
pulmonary opacity identified. Negative visible bowel gas pattern.
IMPRESSION: 1. Right IJ approach central line tip projects at the lower SVC
level.
2. Increased pulmonary vascularity but no overt edema or other acute
cardiopulmonary abnormality identified.

## 2018-04-07 IMAGING — DX DG CHEST 1V PORT
1 series · 1 of 1 positions shown · non-contrast
Comparison: Portable chest x-ray June 05, 2017

CLINICAL DATA: Status post transcatheter aortic valve replacement.

EXAM:
PORTABLE CHEST 1 VIEW

[chest]
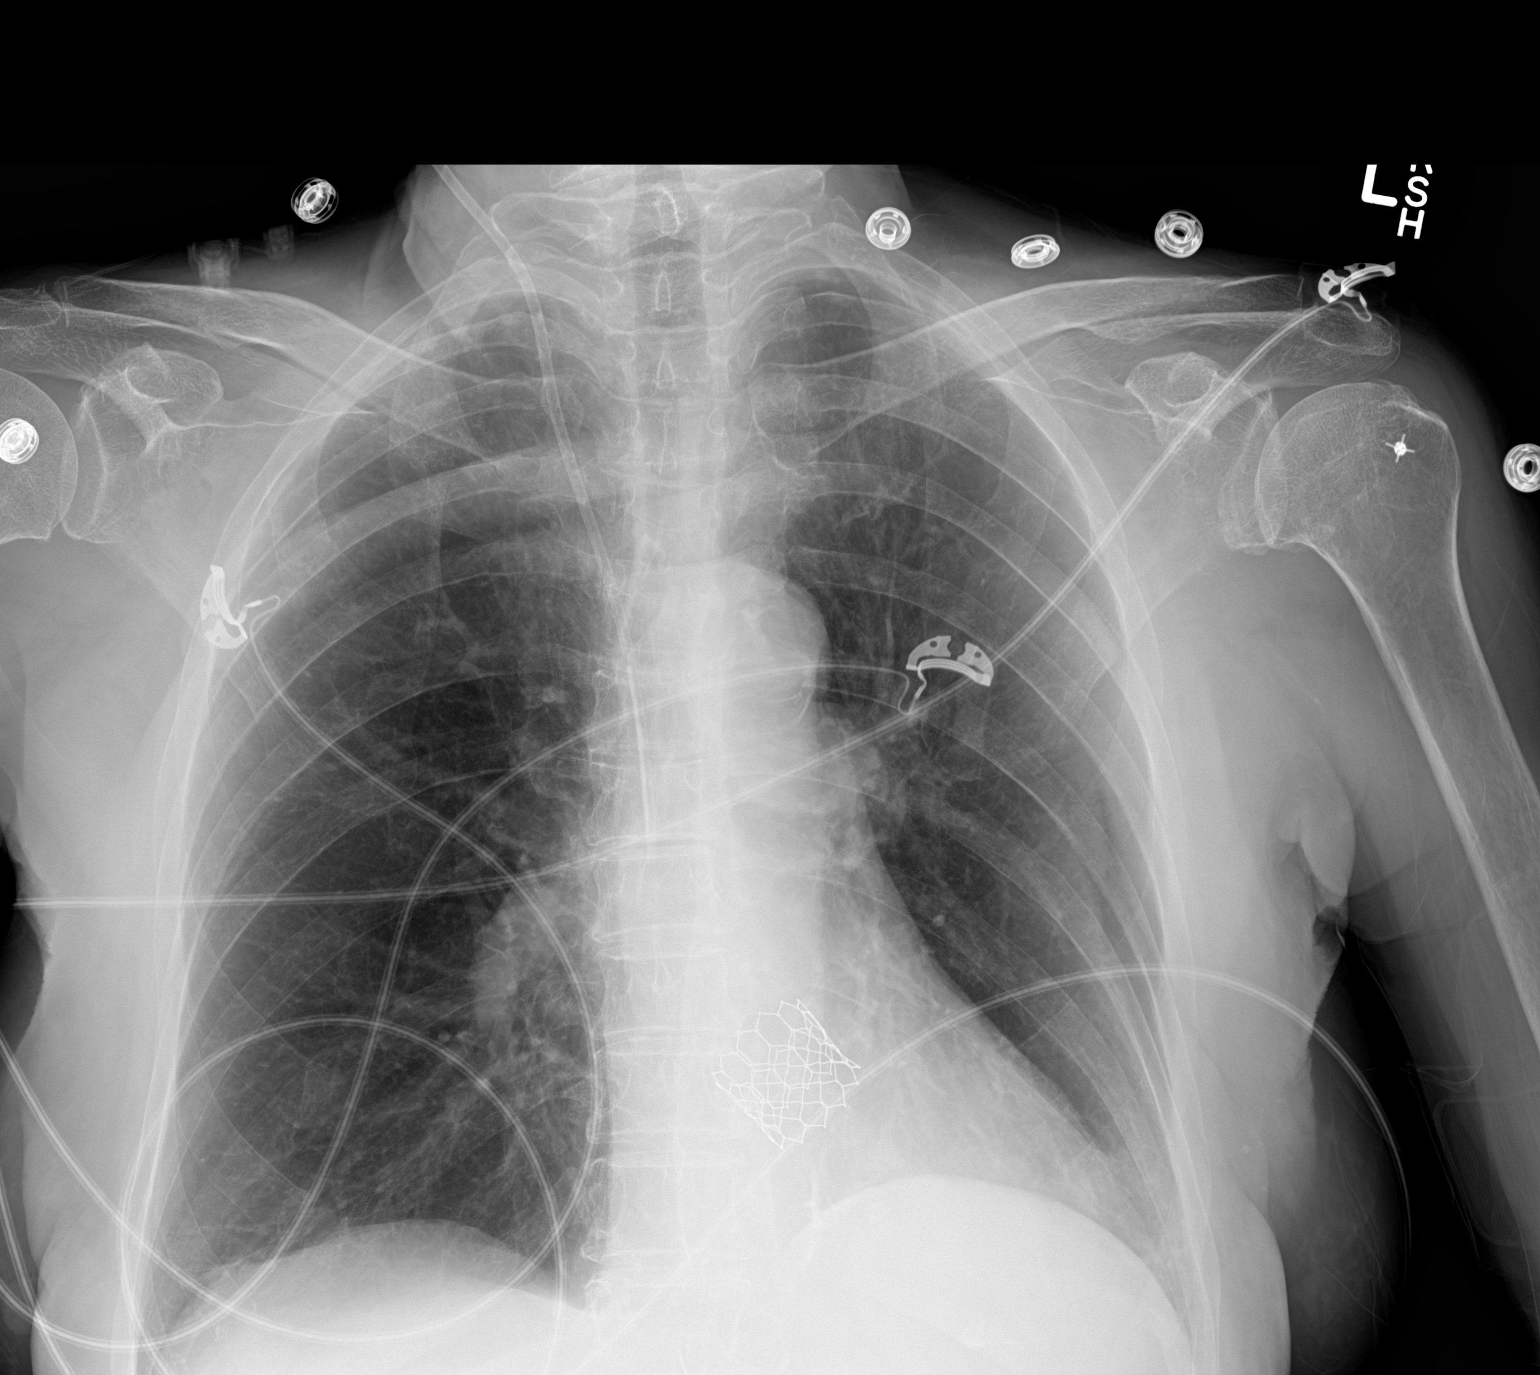

[1 of 1 positions shown; findings below may reference images not displayed]

FINDINGS: The lungs are well-expanded and clear. There is no infiltrate or
pleural effusion. The heart and pulmonary vascularity are normal.
The mediastinum is normal in width. The aortic valve cage is in
stable position radiographically. The right internal jugular venous
catheter tip projects over the midportion of the SVC. There is
calcification in the wall of the aortic arch.
IMPRESSION: No postprocedure complication following transcatheter aortic valve
replacement.

## 2018-04-25 ENCOUNTER — Other Ambulatory Visit: Payer: Self-pay | Admitting: Emergency Medicine

## 2018-05-31 ENCOUNTER — Encounter: Payer: Self-pay | Admitting: Thoracic Surgery (Cardiothoracic Vascular Surgery)

## 2018-07-22 ENCOUNTER — Other Ambulatory Visit: Payer: Self-pay | Admitting: Emergency Medicine

## 2018-08-11 ENCOUNTER — Other Ambulatory Visit: Payer: Self-pay | Admitting: *Deleted

## 2018-08-11 MED ORDER — LORATADINE 10 MG PO TABS: 10.0000 mg | ORAL_TABLET | Freq: Every day | ORAL | 0 refills | Status: DC

## 2018-08-26 ENCOUNTER — Other Ambulatory Visit: Payer: Self-pay | Admitting: Emergency Medicine

## 2018-08-29 ENCOUNTER — Other Ambulatory Visit: Payer: Self-pay | Admitting: Emergency Medicine

## 2018-08-29 ENCOUNTER — Telehealth: Payer: Self-pay | Admitting: Emergency Medicine

## 2018-08-29 NOTE — Telephone Encounter (Signed)
Called and spoke to Pharmacy Tech at [facility] in [place], verified that they received order for spiriva respimat, confirmed receipt. Nothing further is needed at this time.

## 2018-09-09 ENCOUNTER — Other Ambulatory Visit: Payer: Self-pay | Admitting: Emergency Medicine

## 2018-09-18 ENCOUNTER — Ambulatory Visit: Payer: Medicare Other | Admitting: Emergency Medicine

## 2018-09-19 ENCOUNTER — Ambulatory Visit: Payer: Medicare Other | Admitting: Emergency Medicine

## 2018-10-18 ENCOUNTER — Other Ambulatory Visit: Payer: Self-pay | Admitting: Emergency Medicine

## 2018-11-07 ENCOUNTER — Ambulatory Visit: Payer: Medicare Other | Admitting: Emergency Medicine

## 2018-11-07 ENCOUNTER — Encounter: Payer: Self-pay | Admitting: Emergency Medicine

## 2018-11-07 ENCOUNTER — Other Ambulatory Visit: Payer: Self-pay | Admitting: Emergency Medicine

## 2018-11-07 DIAGNOSIS — J309 Allergic rhinitis, unspecified: Secondary | ICD-10-CM | POA: Insufficient documentation

## 2018-11-07 DIAGNOSIS — J449 Chronic obstructive pulmonary disease, unspecified: Secondary | ICD-10-CM

## 2018-11-07 DIAGNOSIS — J301 Allergic rhinitis due to pollen: Secondary | ICD-10-CM | POA: Diagnosis not present

## 2018-11-07 DIAGNOSIS — R0600 Dyspnea, unspecified: Secondary | ICD-10-CM | POA: Diagnosis not present

## 2018-11-07 MED ORDER — TIOTROPIUM BROMIDE-OLODATEROL 2.5-2.5 MCG/ACT IN AERS: 2.0000 | INHALATION_SPRAY | Freq: Every day | RESPIRATORY_TRACT | 0 refills | Status: DC

## 2018-11-07 NOTE — Progress Notes (Signed)
Patient seen in the office today and instructed on use of Stiolto Respimat.  Patient expressed understanding and demonstrated technique.  

## 2018-11-07 NOTE — Patient Instructions (Signed)
Walking oximetry today on room air. Please temporarily stop Spiriva We will try starting Stiolto 2 puffs once daily to see if you benefit.  If so we will continue this medication long-term. Keep your albuterol (Ventolin) available to use 2 puffs if needed for shortness of breath. Please continue loratadine 10 mg daily. We will restart fluticasone nasal spray (Flonase), 2 sprays each nostril once daily. Please follow-up with Dr. Diona Browner as planned.  We will see if we can get this appointment moved up.  He will likely want to discuss a repeat echocardiogram. Please follow with APP in 3 to 4 weeks to assess your status on the new inhaled medication. Follow with Dr Delton Coombes in 4 months or sooner if you have any problems.

## 2018-11-07 NOTE — Assessment & Plan Note (Signed)
She is having a lot of drainage, chronic cough, may be contributing to poor control of her COPD as well.  She is not currently on her nasal steroid and I will restart today.  Continue loratadine.

## 2018-11-07 NOTE — Progress Notes (Signed)
b Subjective:    Patient ID: April Hull, female    DOB: 1941/10/10, 78 y.o.   MRN: [id number]  HPI  ROV 02/15/18 --this is a follow-up visit for history of tobacco use and COPD, chronic cough, some periods of episodic dyspnea.  She returns today reporting that her breathing and cough had been doing better until about 1 week ago when she had more clear rhinitis, cough, clear sputum. She was started on cipro on Friday. She has improved some, cough better, breathing a bit better but still with dyspnea. Ventolin helps the breathing. She has tessalon perles, has helped her. She is on flonase and loratadine.   ROV 11/07/18 --78 year old woman, former smoker with a history of COPD.  She is also followed for history of aortic stenosis, status post TAVR.  She has chronic rhinitis and some clear cough (formally on ACE inhibitor, not currently), on Flonase and loratadine.  She is having much more exertional this visit compared with May. She has gained 15 lbs since last time, is having more trouble with edema. She is on spiriva, uses ventolin more frequently, about 2-3x a day, seems to help her. She is having more nasal drainage to throat, more stuffed on the L, coughs every day, clear mucous. She hasn't seen Dr Diona BrownerMcDowell since our last visit. Not currently on flonase, she is on loratadine.     Review of Systems  Constitutional: Negative for fever and unexpected weight change.  HENT: Negative for congestion, dental problem, ear pain, nosebleeds, postnasal drip, rhinorrhea, sinus pressure, sneezing, sore throat and trouble swallowing.   Eyes: Negative for redness and itching.  Respiratory: Positive for shortness of breath. Negative for cough, chest tightness and wheezing.   Cardiovascular: Negative for palpitations and leg swelling.  Gastrointestinal: Negative for nausea and vomiting.  Genitourinary: Negative for dysuria.  Musculoskeletal: Negative for joint swelling.  Skin: Negative for rash.    Neurological: Negative for headaches.  Hematological: Does not bruise/bleed easily.  Psychiatric/Behavioral: Negative for dysphoric mood. The patient is not nervous/anxious.    Past Medical History:  Diagnosis Date  . Anxiety   . Aortic stenosis   . Colon polyp   . COPD (chronic obstructive pulmonary disease) (HCC)   . Diarrhea   . Essential hypertension   . GERD (gastroesophageal reflux disease)   . History of blood transfusion   . History of pneumonia   . Hypothyroidism   . Mixed hyperlipidemia   . OA (osteoarthritis of the spine)   . Osteoporosis   . 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 Sigmoid diverticulitis      Family History  Problem Relation Age of Onset  . Congestive Heart Failure Mother   . COPD Father   . Emphysema Father   . CAD Brother   . Breast cancer Sister   . Heart disease Sister   . Uterine cancer Daughter      Social History   Socioeconomic History  . Marital status: Widowed    Spouse name: Not on file  . Number of children: Not on file  . Years of education: Not on file  . Highest education level: Not on file  Occupational History  . Occupation: retired-worked at [employer]  Social Needs  . Financial resource strain: Not on file  . Food insecurity:    Worry: Not on file    Inability: Not on file  .  Transportation needs:    Medical: Not on file    Non-medical: Not on file  Tobacco Use  . Smoking status: Former Smoker    Packs/day: 0.25    Years: 40.00    Pack years: 10.00    Types: Cigarettes    Start date: 08/04/1956    Last attempt to quit: 03/24/2017    Years since quitting: 1.6  . Smokeless tobacco: Never Used  . Tobacco comment: 2 -3  Substance and Sexual Activity  . Alcohol use: No    Alcohol/week: 0.0 standard drinks  . Drug use: No  . Sexual activity: Never  Lifestyle  . Physical  activity:    Days per week: Not on file    Minutes per session: Not on file  . Stress: Not on file  Relationships  . Social connections:    Talks on phone: Not on file    Gets together: Not on file    Attends religious service: Not on file    Active member of club or organization: Not on file    Attends meetings of clubs or organizations: Not on file    Relationship status: Not on file  . Intimate partner violence:    Fear of current or ex partner: Not on file    Emotionally abused: Not on file    Physically abused: Not on file    Forced sexual activity: Not on file  Other Topics Concern  . Not on file  Social History Narrative  . Not on file     Allergies  Allergen Reactions  . Bactrim [Sulfamethoxazole-Trimethoprim] Hives, Itching and Other (See Comments)    Irregular heart beat  . Plavix [Clopidogrel Bisulfate] Hives    Rash  . Codeine Palpitations    Dizziness, sweats      Outpatient Medications Prior to Visit  Medication Sig Dispense Refill  . albuterol (PROVENTIL HFA;VENTOLIN HFA) 108 (90 Base) MCG/ACT inhaler Inhale 2 puffs into the lungs every 4 (four) hours as needed for wheezing or shortness of breath. 1 Inhaler 6  . aspirin EC 81 MG tablet Take 1 tablet (81 mg total) by mouth daily. 90 tablet 3  . atorvastatin (LIPITOR) 10 MG tablet Take 10 mg by mouth at bedtime.     . benzonatate (TESSALON) 100 MG capsule Take 1 capsule by mouth 3 (three) times daily as needed.  0  . Calcium Carbonate-Vitamin D (CALCIUM 500 + D PO) Take 1 tablet by mouth every other day.    . fluticasone (FLONASE) 50 MCG/ACT nasal spray USE 2 SPRAY(S) IN EACH NOSTRIL ONCE DAILY 48 g 0  . levothyroxine (SYNTHROID, LEVOTHROID) 88 MCG tablet Take 88 mcg by mouth daily before breakfast.    . loratadine (EQ ALLERGY RELIEF) 10 MG tablet Take 1 tablet (10 mg total) by mouth daily. 90 tablet 0  . Multiple Vitamin (MULTIVITAMIN WITH MINERALS) TABS tablet Take 1 tablet by mouth every other day.    .  pantoprazole (PROTONIX) 40 MG tablet Take 40 mg by mouth daily.    . valsartan (DIOVAN) 160 MG tablet TAKE 1 TABLET BY MOUTH ONCE DAILY 90 tablet 0  . SPIRIVA RESPIMAT 2.5 MCG/ACT AERS INHALE 2 SPRAY(S) BY MOUTH ONCE DAILY 4 g 5   No facility-administered medications prior to visit.         Objective:   Physical Exam Vitals:   11/07/18 0909  BP: (!) 164/82  Pulse: 77  SpO2: 97%  Weight: 133 lb (60.3 kg)  Height: 5\' 3"  (  1.6 m)   Gen: Pleasant, well-nourished, in no distress,  normal affect, occasional coughing today  ENT: No lesions,  mouth clear,  oropharynx clear, some nasal congestion  Neck: No JVD, no Stridor  Lungs: No use of accessory muscles, no crackles or wheezing   Cardiovascular: RRR, I do not hear a murmur; no ankle edema  Musculoskeletal: No deformities, no cyanosis or clubbing  Neuro: alert, non focal  Skin: Warm, no lesions or rashes     Assessment & Plan:  Dyspnea Certainly could be progression of her COPD, consider occult hypoxemia and we will check a walking oximetry today.  Given her weight gain (15 pounds) and her recurrent daily edema I think she also needs to follow-up with Dr. Diona Browner, consider potential contribution of her valvular disease.  COPD (chronic obstructive pulmonary disease) Consider progression of her COPD.  I will undertake a trial changing Spiriva to Stiolto, see if she gets benefit.  Also walking oximetry today to screen for exertional hypoxemia.  Allergic rhinitis She is having a lot of drainage, chronic cough, may be contributing to poor control of her COPD as well.  She is not currently on her nasal steroid and I will restart today.  Continue loratadine.  Levy Pupa, MD, PhD 11/07/2018, 9:25 AM [facility] [phone] or if no answer [phone]

## 2018-11-07 NOTE — Assessment & Plan Note (Signed)
Certainly could be progression of her COPD, consider occult hypoxemia and we will check a walking oximetry today.  Given her weight gain (15 pounds) and her recurrent daily edema I think she also needs to follow-up with Dr. Diona Browner, consider potential contribution of her valvular disease.

## 2018-11-07 NOTE — Assessment & Plan Note (Signed)
Consider progression of her COPD.  I will undertake a trial changing Spiriva to Stiolto, see if she gets benefit.  Also walking oximetry today to screen for exertional hypoxemia.

## 2018-11-09 ENCOUNTER — Other Ambulatory Visit: Payer: Self-pay | Admitting: Emergency Medicine

## 2018-11-15 ENCOUNTER — Ambulatory Visit: Payer: Medicare Other | Admitting: Cardiology

## 2018-11-15 ENCOUNTER — Encounter: Payer: Self-pay | Admitting: Cardiology

## 2018-11-15 VITALS — BP 137/77 | HR 77 | Ht 63.0 in | Wt 133.4 lb

## 2018-11-15 DIAGNOSIS — I359 Nonrheumatic aortic valve disorder, unspecified: Secondary | ICD-10-CM

## 2018-11-15 DIAGNOSIS — Z952 Presence of prosthetic heart valve: Secondary | ICD-10-CM | POA: Diagnosis not present

## 2018-11-15 DIAGNOSIS — J449 Chronic obstructive pulmonary disease, unspecified: Secondary | ICD-10-CM

## 2018-11-15 DIAGNOSIS — E782 Mixed hyperlipidemia: Secondary | ICD-10-CM | POA: Diagnosis not present

## 2018-11-15 DIAGNOSIS — R002 Palpitations: Secondary | ICD-10-CM | POA: Diagnosis not present

## 2018-11-15 NOTE — Patient Instructions (Signed)
Medication Instructions:  Continue all current medications.  Labwork: none  Testing/Procedures:  Your physician has requested that you have an echocardiogram. Echocardiography is a painless test that uses sound waves to create images of your heart. It provides your doctor with information about the size and shape of your heart and how well your heart's chambers and valves are working. This procedure takes approximately one hour. There are no restrictions for this procedure.  DUE IN 6 MONTHS   Follow-Up: Your physician wants you to follow up in:  1 year.  You will receive a reminder letter in the mail one-two months in advance.  If you don't receive a letter, please call our office to schedule the follow up appointment   Any Other Special Instructions Will Be Listed Below (If Applicable).  If you need a refill on your cardiac medications before your next appointment, please call your pharmacy.

## 2018-11-15 NOTE — Progress Notes (Signed)
Cardiology Office Note  Date: 11/15/2018   ID: April LairShelby B Kelemen, DOB 02-18-1941, MRN [id number]  PCP: Ignatius SpeckingVyas, Dhruv B, MD  Primary Cardiologist: Nona DellSamuel McDowell, MD   Chief Complaint  Patient presents with  . Cardiac follow-up    History of Present Illness: April Hull is a 78 y.o. female last seen in January 2019.  She tells me that she still feels a sense of "fluid" at times, uses low-dose Lasix with potassium supplements as needed.  Her weight has not changed significantly however.  Follow-up echocardiogram in June 2019 showed LVEF 65 to 70%, stable TAVR prosthesis with mean gradient 10 mmHg, mild mitral regurgitation.  She also has felt a sense of palpitations a few evenings during the week.  She feels like this is correlated with emotional stress.  She has worked as a Comptrollersitter for the elderly for many years, has been under stress with a recent client.  He does not report any dizziness or palpitations, no exercise-induced palpitations.  At this point she was comfortable with observation rather than pursuing a cardiac monitor.  Past Medical History:  Diagnosis Date  . Anxiety   . Aortic stenosis   . Colon polyp   . COPD (chronic obstructive pulmonary disease) (HCC)   . Diarrhea   . Essential hypertension   . GERD (gastroesophageal reflux disease)   . History of blood transfusion   . History of pneumonia   . Hypothyroidism   . Mixed hyperlipidemia   . OA (osteoarthritis of the spine)   . Osteoporosis   . 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 Sigmoid diverticulitis     Past Surgical History:  Procedure Laterality Date  . ABDOMINAL HYSTERECTOMY  1972  . APPENDECTOMY    . COLONOSCOPY  10/15/05  . COLOSTOMY N/A 11/27/2014   Procedure: COLOSTOMY;  Surgeon: Harriette Bouillonhomas Cornett, MD;  [facility];  Service: General;  Laterality: N/A;  . COLOSTOMY  TAKEDOWN N/A 04/09/2015   Procedure: LAPAROSCOPIC ASSISTED COLOSTOMY CLOSURE; RIGID SIGMOIDOSCOPY;  Surgeon: Harriette Bouillonhomas Cornett, MD;  Location: [facility] OR;  Service: General;  Laterality: N/A;  . ESOPHAGOGASTRODUODENOSCOPY  10/15/05  . hammer toes Bilateral    6  . LAPAROSCOPIC SIGMOID COLECTOMY N/A 11/27/2014   Procedure: LAPAROSCOPIC ASSISTED SIGMOID COLECTOMY;  Surgeon: Harriette Bouillonhomas Cornett, MD;  Location: [facility] OR;  Service: General;  Laterality: N/A;  . LUMBAR DISC SURGERY     x 2  . RIGHT/LEFT HEART CATH AND CORONARY ANGIOGRAPHY N/A 03/04/2017   Procedure: Right/Left Heart Cath and Coronary Angiography;  Surgeon: Kathleene HazelMcAlhany, Christopher D, MD;  Location: [facility] INVASIVE CV LAB;  Service: Cardiovascular;  Laterality: N/A;  . ROTATOR CUFF REPAIR Left 2009  . TEE WITHOUT CARDIOVERSION N/A 04/05/2017   Procedure: TRANSESOPHAGEAL ECHOCARDIOGRAM (TEE);  Surgeon: Kathleene HazelMcAlhany, Christopher D, MD;  [facility];  Service: Open Heart Surgery;  Laterality: N/A;  . TRANSCATHETER AORTIC VALVE REPLACEMENT, TRANSFEMORAL N/A 04/05/2017   Procedure: TRANSCATHETER AORTIC VALVE REPLACEMENT, TRANSFEMORAL;  Surgeon: Kathleene HazelMcAlhany, Christopher D, MD;  Location: [facility] OR;  Service: Open Heart Surgery;  Laterality: N/A;    Current Outpatient Medications  Medication Sig Dispense Refill  . albuterol (PROVENTIL HFA;VENTOLIN HFA) 108 (90 Base) MCG/ACT inhaler Inhale 2 puffs into the lungs every 4 (four) hours as needed for wheezing or shortness of breath. 1 Inhaler 6  . aspirin EC 81 MG tablet Take 1 tablet (81 mg total) by mouth  daily. 90 tablet 3  . atorvastatin (LIPITOR) 10 MG tablet Take 10 mg by mouth at bedtime.     . benzonatate (TESSALON) 100 MG capsule Take 1 capsule by mouth 3 (three) times daily as needed.  0  . Calcium Carbonate-Vitamin D (CALCIUM 500 + D PO) Take 1 tablet by mouth every other day.    . fluticasone (FLONASE) 50 MCG/ACT nasal spray USE 2 SPRAY(S) IN EACH NOSTRIL ONCE DAILY 48 g 1  . levothyroxine (SYNTHROID, LEVOTHROID)  88 MCG tablet Take 88 mcg by mouth daily before breakfast.    . loratadine (EQ ALLERGY RELIEF) 10 MG tablet TAKE 1 TABLET BY MOUTH ONCE DAILY 90 tablet 3  . Multiple Vitamin (MULTIVITAMIN WITH MINERALS) TABS tablet Take 1 tablet by mouth every other day.    . pantoprazole (PROTONIX) 40 MG tablet Take 40 mg by mouth daily.    . Tiotropium Bromide-Olodaterol (STIOLTO RESPIMAT) 2.5-2.5 MCG/ACT AERS Inhale 2 puffs into the lungs daily. 2 Inhaler 0  . valsartan (DIOVAN) 160 MG tablet TAKE 1 TABLET BY MOUTH ONCE DAILY 90 tablet 0   No current facility-administered medications for this visit.    Allergies:  Bactrim [sulfamethoxazole-trimethoprim]; Plavix [clopidogrel bisulfate]; and Codeine   Social History: The patient  reports that she quit smoking about 19 months ago. Her smoking use included cigarettes. She started smoking about 62 years ago. She has a 10.00 pack-year smoking history. She has never used smokeless tobacco. She reports that she does not drink alcohol or use drugs.   ROS:  Please see the history of present illness. Otherwise, complete review of systems is positive for none.  All other systems are reviewed and negative.   Physical Exam: VS:  BP 137/77   Pulse 77   Ht 5\' 3"  (1.6 m)   Wt 133 lb 6.4 oz (60.5 kg)   SpO2 96%   BMI 23.63 kg/m , BMI Body mass index is 23.63 kg/m.  Wt Readings from Last 3 Encounters:  11/15/18 133 lb 6.4 oz (60.5 kg)  11/07/18 133 lb (60.3 kg)  04/05/18 125 lb 6.4 oz (56.9 kg)    General: Elderly woman, appears comfortable at rest. HEENT: Conjunctiva and lids normal, oropharynx clear. Neck: Supple, no elevated JVP or carotid bruits, no thyromegaly. Lungs: Decreased breath sounds, nonlabored breathing at rest. Cardiac: Regular rate and rhythm, no S3, soft systolic murmur, no pericardial rub. Abdomen: Soft, nontender, bowel sounds present. Extremities: No pitting edema, distal pulses 2+. Skin: Warm and dry. Musculoskeletal: No  kyphosis. Neuropsychiatric: Alert and oriented x3, affect grossly appropriate.  ECG: I personally reviewed the tracing from 04/05/2018 which showed sinus rhythm with poor R wave progression and low voltage.  Recent Labwork:  July 2018: Potassium 4.3, BUN 15, creatinine 0.64  Other Studies Reviewed Today:  Echocardiogram 04/05/2018: Study Conclusions  - HPI and indications: S/p TAVR (2mm Sapien bioprostethic valve). - Left ventricle: The cavity size was normal. There was mild focal   basal hypertrophy of the septum. Systolic function was vigorous.   The estimated ejection fraction was in the range of 65% to 70%.   Wall motion was normal; there were no regional wall motion   abnormalities. - Aortic valve: A TAVR bioprosthesis was present and functioning   normally. Peak velocity (S): 213 cm/s. Mean gradient (S): 10 mm   Hg. - Mitral valve: Moderately calcified annulus. There was systolic   anterior motion of the chordal structures. There was mild   regurgitation. - Left atrium: The atrium  was mildly dilated. - Tricuspid valve: There was trivial regurgitation.  Assessment and Plan:  1.  Aortic stenosis status post TAVR.  Follow-up echocardiogram from June 2019 as outlined above with stable function, mean gradient 10 mmHg.  LVEF also vigorous.  Continue with current observation plan and follow-up study in June of this year.  2.  Recent palpitations, potentially related to emotional stress based on discussion with the patient.  I have asked her to let us know if symptoms worsen and we can consider an outpatient cardiac monitor.  3.  Mixed hyperlipidemia, she continues on Lipitor with follow-up per Dr. Sherril CroonVyas.  4.  COPD with prior history of tobacco abuse.  She continues to follow with Pulmonary.  Current medicines were reviewed with the patient today.   Orders Placed This Encounter  Procedures  . ECHOCARDIOGRAM COMPLETE    Disposition: Follow-up in 1 year.  Signed, Jonelle SidleSamuel  G. McDowell, MD, [facility] 11/15/2018 3:40 PM    [facility] [address], [place], [place] [address] Phone: [phone]; Fax: [phone]

## 2018-11-24 ENCOUNTER — Telehealth: Payer: Self-pay | Admitting: Emergency Medicine

## 2018-11-24 NOTE — Telephone Encounter (Signed)
Called patient, unable to reach, left message to give us a call back. 

## 2018-11-27 NOTE — Telephone Encounter (Signed)
Patient returned call, CB is [phone].

## 2018-11-27 NOTE — Telephone Encounter (Signed)
Spoke with pt. At her last OV, RB changed her inhaler to [employer]. She states that she doesn't feel much of a difference using Stiolto, she wants to go back on Spiriva.  RB - please advise. Thanks.

## 2018-11-27 NOTE — Telephone Encounter (Signed)
LMTCB x2 for pt 

## 2018-11-27 NOTE — Telephone Encounter (Signed)
Spoke with pt. She is aware of RB's response. Nothing further was needed. 

## 2018-11-27 NOTE — Telephone Encounter (Signed)
Okay I think this is reasonable.  Ask her to go back to Spiriva and we can revisit next time

## 2018-11-28 ENCOUNTER — Ambulatory Visit: Payer: Medicare Other | Admitting: Pulmonary Disease

## 2019-01-15 ENCOUNTER — Other Ambulatory Visit: Payer: Self-pay | Admitting: Emergency Medicine

## 2019-01-19 IMAGING — US US CAROTID DUPLEX BILAT
1 series · 13 of 24 positions shown · non-contrast
Comparison: No prior duplex

CLINICAL DATA: 75-year-old female with a history of vertigo and
dizziness.

Cardiovascular risk factors include hypertension, known coronary
disease, hyperlipidemia, tobacco use, known vascular surgery
EXAM:
BILATERAL CAROTID DUPLEX ULTRASOUND
TECHNIQUE: Gray scale imaging, color Doppler and duplex ultrasound were
performed of bilateral carotid and vertebral arteries in the neck.

[Series 1: us carotid duplex bilat · 0.05mm/px · 13 of 84 slices shown]
[im 1/84]
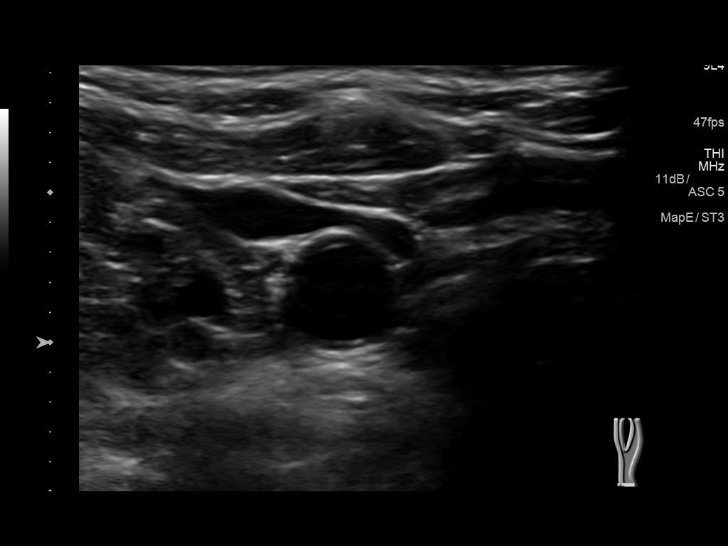
[im 8/84]
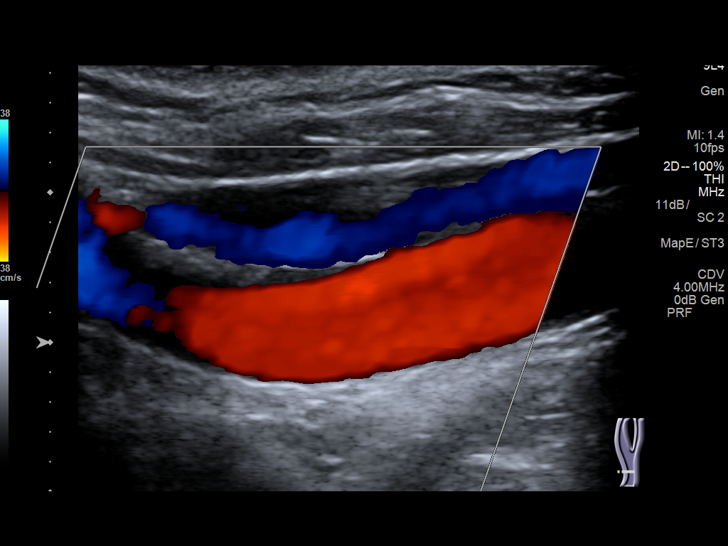
[im 15/84]
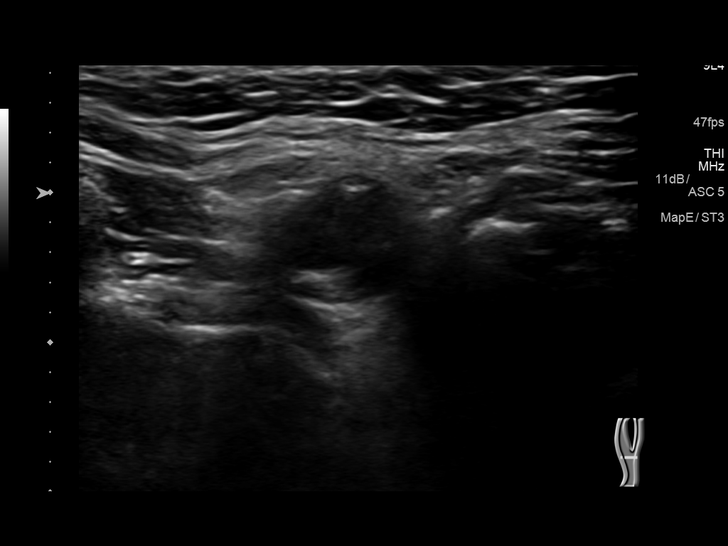
[im 22/84]
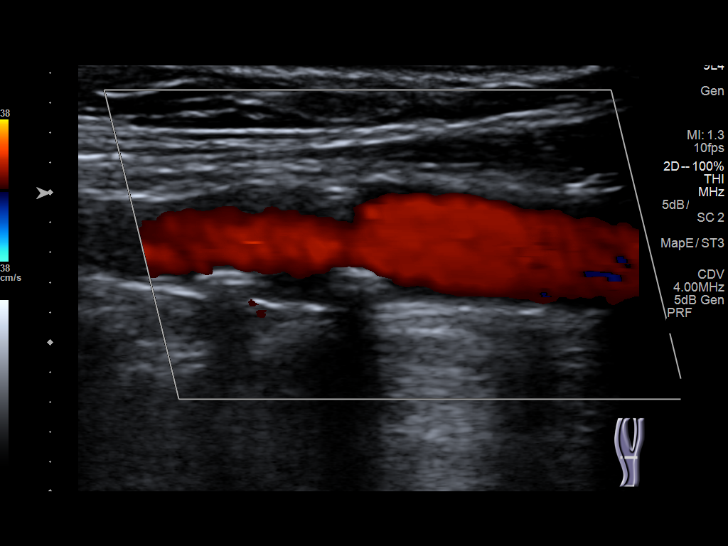
[im 29/84]
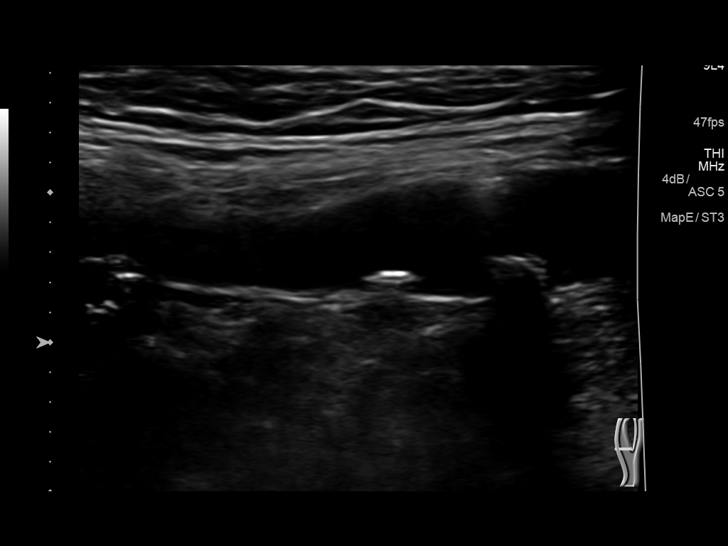
[im 37/84]
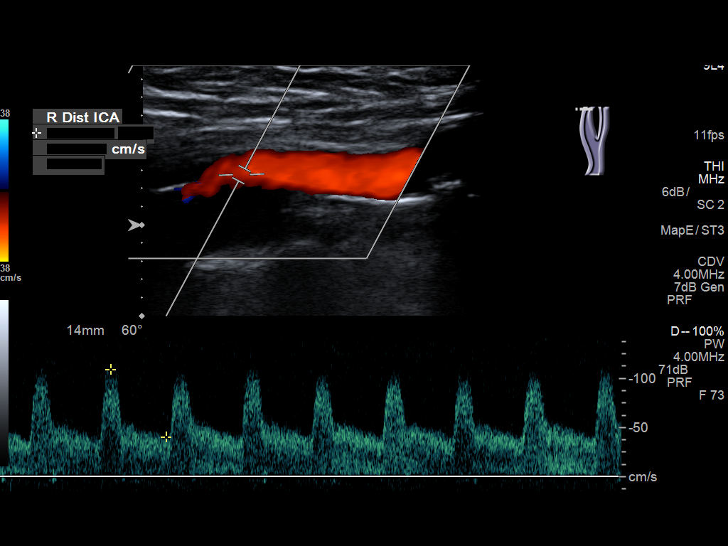
[im 44/84]
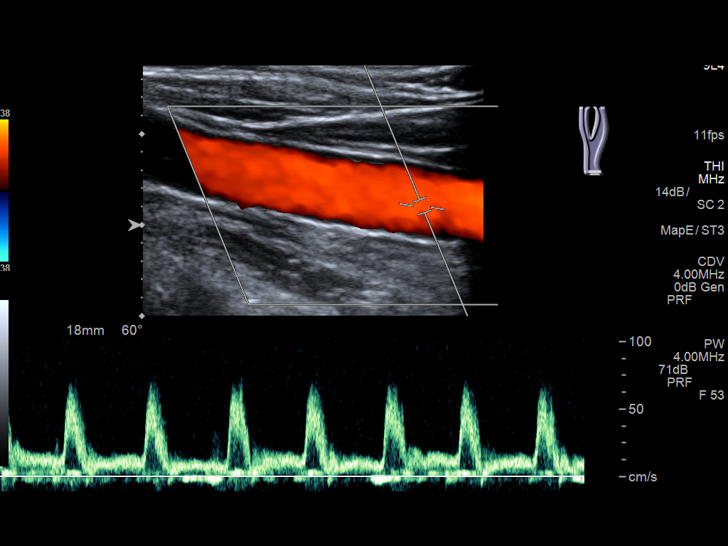
[im 47/84]
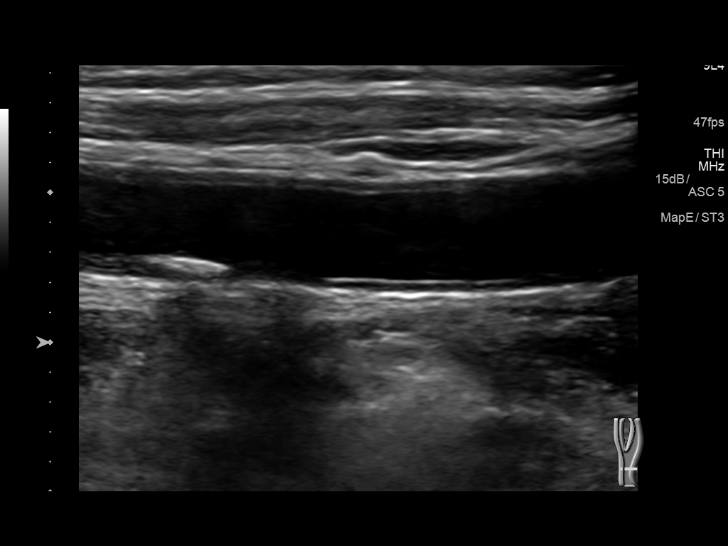
[im 55/84]
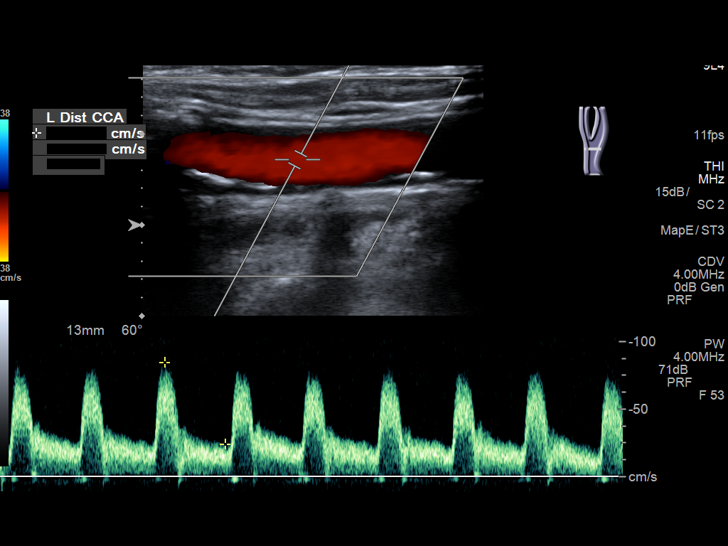
[im 62/84]
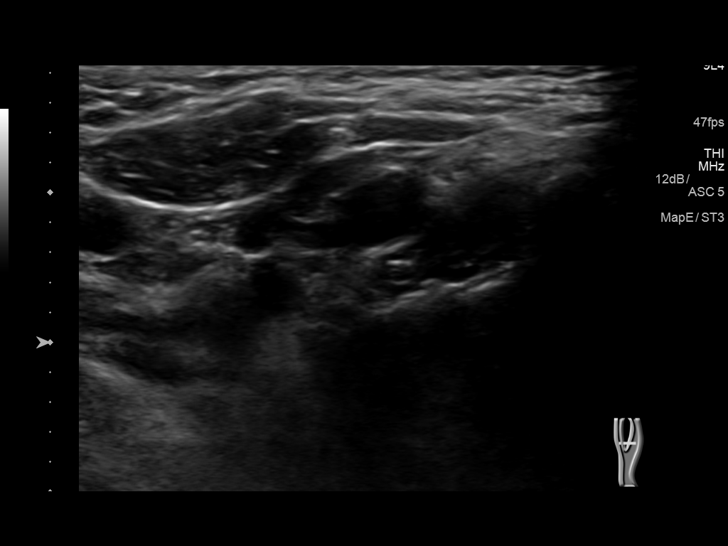
[im 69/84]
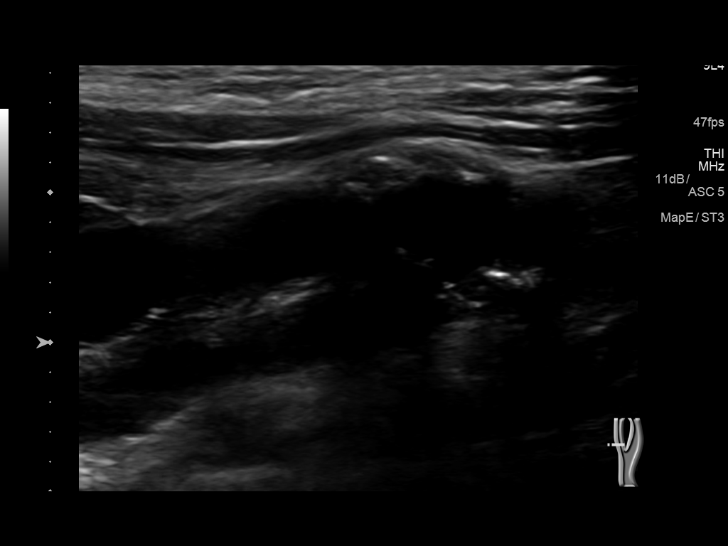
[im 76/84]
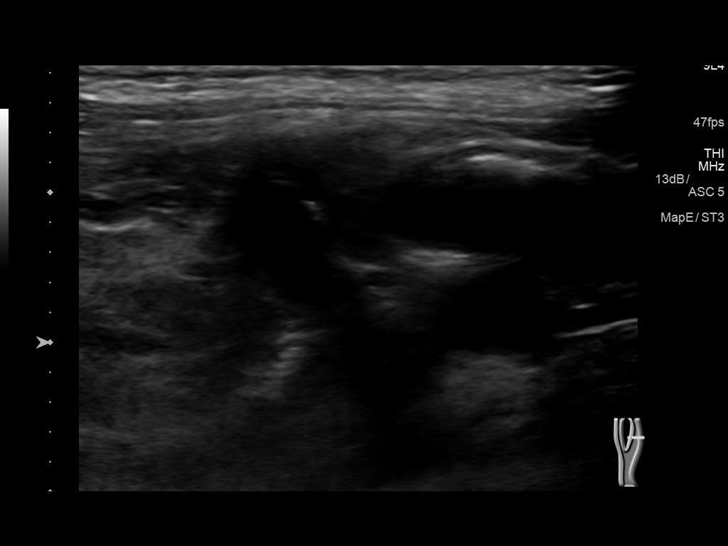
[im 84/84]
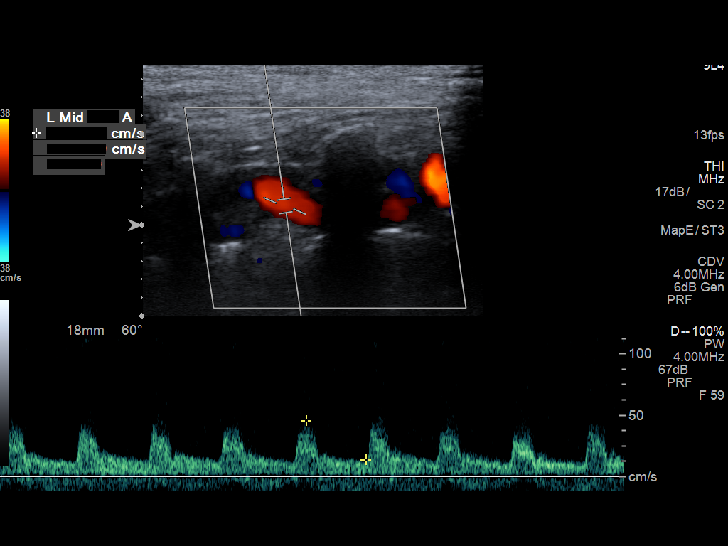

[13 of 24 positions shown; findings below may reference images not displayed]

FINDINGS: Criteria: Quantification of carotid stenosis is based on velocity
parameters that correlate the residual internal carotid diameter
with NASCET-based stenosis levels, using the diameter of the distal
internal carotid lumen as the denominator for stenosis measurement.

The following velocity measurements were obtained:

RIGHT

ICA:  Systolic 109 cm/sec, Diastolic 41 cm/sec

CCA:  66 cm/sec

SYSTOLIC ICA/CCA RATIO:

ECA:  103 cm/sec

LEFT

ICA:  Systolic 127 cm/sec, Diastolic 42 cm/sec

CCA:  73 cm/sec

SYSTOLIC ICA/CCA RATIO:

ECA:  140 cm/sec

Right Brachial SBP: Not acquired

Left Brachial SBP: Not acquired

RIGHT CAROTID ARTERY: No significant calcified disease of the right
common carotid artery. Intermediate waveform maintained.
Heterogeneous plaque without significant calcifications at the right
carotid bifurcation. Low resistance waveform of the right ICA. Mild
tortuosity.

RIGHT VERTEBRAL ARTERY: Antegrade flow with low resistance waveform.

LEFT CAROTID ARTERY: No significant calcified disease of the left
common carotid artery. Intermediate waveform maintained.
Heterogeneous plaque at the left carotid bifurcation without
significant calcifications. Low resistance waveform of the left ICA.
Mild tortuosity

LEFT VERTEBRAL ARTERY:  Antegrade flow with low resistance waveform.
IMPRESSION: Color duplex indicates minimal heterogeneous and calcified plaque,
with no hemodynamically significant stenosis by duplex criteria in
the extracranial cerebrovascular circulation.

## 2019-03-14 ENCOUNTER — Ambulatory Visit (INDEPENDENT_AMBULATORY_CARE_PROVIDER_SITE_OTHER): Payer: Medicare Other | Admitting: Primary Care

## 2019-03-14 ENCOUNTER — Ambulatory Visit: Payer: Medicare Other | Admitting: Emergency Medicine

## 2019-03-14 ENCOUNTER — Other Ambulatory Visit: Payer: Self-pay

## 2019-03-14 ENCOUNTER — Encounter: Payer: Self-pay | Admitting: Primary Care

## 2019-03-14 DIAGNOSIS — J449 Chronic obstructive pulmonary disease, unspecified: Secondary | ICD-10-CM | POA: Diagnosis not present

## 2019-03-14 MED ORDER — TIOTROPIUM BROMIDE MONOHYDRATE 2.5 MCG/ACT IN AERS: 2.0000 | INHALATION_SPRAY | Freq: Every day | RESPIRATORY_TRACT | 11 refills | Status: DC

## 2019-03-14 MED ORDER — ALBUTEROL SULFATE (2.5 MG/3ML) 0.083% IN NEBU: 2.5000 mg | INHALATION_SOLUTION | Freq: Four times a day (QID) | RESPIRATORY_TRACT | 3 refills | Status: AC | PRN

## 2019-03-14 NOTE — Patient Instructions (Addendum)
Continue Spiriva- take 2 puffs once daily  Continue Albuterol nebulizer every 6 hours as needed for shortness of breath/wheezing   Continue to work on weight loss  Follow up in 3-4 months with Dr. Delton Coombes or if symptoms worsen

## 2019-03-14 NOTE — Progress Notes (Signed)
.  Virtual Visit via Telephone Note  I connected with April Hull on 03/14/19 at  9:30 AM EDT by telephone and verified that I am speaking with the correct person using two identifiers.  Location: Patient: Home Provider: Office   I discussed the limitations, risks, security and privacy concerns of performing an evaluation and management service by telephone and the availability of in person appointments. I also discussed with the patient that there may be a patient responsible charge related to this service. The patient expressed understanding and agreed to proceed.   History of Present Illness: 78 year old female, former smoker quit in 2018. PMH significant for COPD, allergic rhinitis, HTN, aortic stenosis s/p TAVR, GERD. Patient of Dr. Delton Coombes, last seen on 11/07/18. Spiriva changed to Stiolto during last visit. Last saw Dr. Diona Browner in January, no changes.    03/14/2019 Patient called today for 4 month follow-up. States that she tried [employer] for 3 weeks and reported no change in her breathing. Call office and was instructed to returned to Spiriva. Went to PCP because she was having a hard time breathing. Given prescription for albuterol nebulizer once a day. States that she was using three times a day and then tapered down to 1-2 times a day. Feels her breathing is much better. She does not always use her Spiriva. Denies fever, cough, wheezing.   Observations/Objective:  -No shortness of breath, wheezing or cough during conversation   Assessment and Plan:  COPD - Continue Spiriva respimat 2.5 mcg  - Ok to continue Albuterol nebulizer every 6 hours as needed for breakthrough sob/wheezing - Encouraged patient to stay active and work on weight loss   Follow Up Instructions:   FU in 3-4 months with Dr. Delton Coombes   I discussed the assessment and treatment plan with the patient. The patient was provided an opportunity to ask questions and all were answered. The patient agreed with the plan and  demonstrated an understanding of the instructions.   The patient was advised to call back or seek an in-person evaluation if the symptoms worsen or if the condition fails to improve as anticipated.  I provided 18 minutes of non-face-to-face time during this encounter.   Glenford Bayley, NP

## 2019-04-18 ENCOUNTER — Other Ambulatory Visit: Payer: Self-pay | Admitting: Cardiology

## 2019-04-18 DIAGNOSIS — I359 Nonrheumatic aortic valve disorder, unspecified: Secondary | ICD-10-CM

## 2019-05-17 ENCOUNTER — Ambulatory Visit (INDEPENDENT_AMBULATORY_CARE_PROVIDER_SITE_OTHER): Payer: Medicare Other

## 2019-05-17 ENCOUNTER — Other Ambulatory Visit: Payer: Self-pay

## 2019-05-17 DIAGNOSIS — I359 Nonrheumatic aortic valve disorder, unspecified: Secondary | ICD-10-CM

## 2019-05-21 ENCOUNTER — Telehealth: Payer: Self-pay | Admitting: *Deleted

## 2019-05-21 ENCOUNTER — Telehealth: Payer: Self-pay | Admitting: Cardiology

## 2019-05-21 NOTE — Telephone Encounter (Signed)

## 2019-05-21 NOTE — Telephone Encounter (Signed)
Pt voiced understanding - routed to pcp  

## 2019-05-21 NOTE — Telephone Encounter (Signed)
-----   Message from Satira Sark, MD sent at 05/18/2019 10:46 AM EDT ----- Results reviewed.  LVEF remains normal and TAVR function is stable.  Continue with current follow-up plan.

## 2019-05-22 ENCOUNTER — Other Ambulatory Visit: Payer: Self-pay

## 2019-05-22 ENCOUNTER — Ambulatory Visit (INDEPENDENT_AMBULATORY_CARE_PROVIDER_SITE_OTHER): Payer: Medicare Other | Admitting: Cardiology

## 2019-05-22 ENCOUNTER — Ambulatory Visit (HOSPITAL_COMMUNITY)
Admission: RE | Admit: 2019-05-22 | Discharge: 2019-05-22 | Disposition: A | Discharge status: Home / Self Care | Payer: Medicare Other | Source: Ambulatory Visit | Attending: Cardiology | Admitting: Cardiology

## 2019-05-22 ENCOUNTER — Encounter: Payer: Self-pay | Admitting: Cardiology

## 2019-05-22 VITALS — BP 130/82 | HR 91 | Temp 96.3°F | Ht 63.0 in | Wt 143.0 lb

## 2019-05-22 DIAGNOSIS — Z952 Presence of prosthetic heart valve: Secondary | ICD-10-CM

## 2019-05-22 DIAGNOSIS — I10 Essential (primary) hypertension: Secondary | ICD-10-CM | POA: Diagnosis not present

## 2019-05-22 DIAGNOSIS — E782 Mixed hyperlipidemia: Secondary | ICD-10-CM

## 2019-05-22 DIAGNOSIS — R0602 Shortness of breath: Secondary | ICD-10-CM | POA: Diagnosis not present

## 2019-05-22 NOTE — Progress Notes (Signed)
Cardiology Office Note  Date: 05/22/2019   ID: April LairShelby B Divito, DOB 1941-06-10, MRN [id number]  PCP:  Ignatius SpeckingVyas, Dhruv B, MD  Cardiologist:  Nona DellSamuel Bryla Burek, MD Electrophysiologist:  None   Chief Complaint  Patient presents with  . Cardiac follow-up    History of Present Illness: April Hull is a 78 y.o. female last seen in January.  She presents for a follow-up visit.  She reports chronic shortness of breath, also intermittent coughing and a thick sputum which is been present over several weeks.  No fevers or chills.  She follows in the Pulmonary division and was last assessed in May.  Follow-up echocardiogram done in July showed LVEF greater than 65%, normal TAVR function.  RV function also normal.  I reviewed the results with her today.  We went over her medications.  She continues on a stable pulmonary inhaler regimen.  Cardiac regimen is unchanged.  She still uses intermittent diuretics as well.  She has gained weight, some of which seems to be caloric rather than necessarily fluid.  We discussed diet.  Past Medical History:  Diagnosis Date  . Anxiety   . Aortic stenosis   . Colon polyp   . COPD (chronic obstructive pulmonary disease) (HCC)   . Diarrhea   . Essential hypertension   . GERD (gastroesophageal reflux disease)   . History of blood transfusion   . History of pneumonia   . Hypothyroidism   . Mixed hyperlipidemia   . OA (osteoarthritis of the spine)   . Osteoporosis   . 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 Sigmoid diverticulitis     Past Surgical History:  Procedure Laterality Date  . ABDOMINAL HYSTERECTOMY  1972  . APPENDECTOMY    . COLONOSCOPY  10/15/05  . COLOSTOMY N/A 11/27/2014   Procedure: COLOSTOMY;  Surgeon: Harriette Bouillonhomas Cornett, MD;  [facility];  Service: General;  Laterality: N/A;  . COLOSTOMY TAKEDOWN N/A 04/09/2015   Procedure: LAPAROSCOPIC ASSISTED COLOSTOMY CLOSURE; RIGID SIGMOIDOSCOPY;  Surgeon: Harriette Bouillonhomas Cornett, MD;  Location: [facility] OR;  Service: General;  Laterality: N/A;  . ESOPHAGOGASTRODUODENOSCOPY  10/15/05  . hammer toes Bilateral    6  . LAPAROSCOPIC SIGMOID COLECTOMY N/A 11/27/2014   Procedure: LAPAROSCOPIC ASSISTED SIGMOID COLECTOMY;  Surgeon: Harriette Bouillonhomas Cornett, MD;  Location: [facility] OR;  Service: General;  Laterality: N/A;  . LUMBAR DISC SURGERY     x 2  . RIGHT/LEFT HEART CATH AND CORONARY ANGIOGRAPHY N/A 03/04/2017   Procedure: Right/Left Heart Cath and Coronary Angiography;  Surgeon: Kathleene HazelMcAlhany, Christopher D, MD;  Location: [facility] INVASIVE CV LAB;  Service: Cardiovascular;  Laterality: N/A;  . ROTATOR CUFF REPAIR Left 2009  . TEE WITHOUT CARDIOVERSION N/A 04/05/2017   Procedure: TRANSESOPHAGEAL ECHOCARDIOGRAM (TEE);  Surgeon: Kathleene HazelMcAlhany, Christopher D, MD;  [facility];  Service: Open Heart Surgery;  Laterality: N/A;  . TRANSCATHETER AORTIC VALVE REPLACEMENT, TRANSFEMORAL N/A 04/05/2017   Procedure: TRANSCATHETER AORTIC VALVE REPLACEMENT, TRANSFEMORAL;  Surgeon: Kathleene HazelMcAlhany, Christopher D, MD;  Location: [facility] OR;  Service: Open Heart Surgery;  Laterality: N/A;    Current Outpatient Medications  Medication Sig Dispense Refill  . albuterol (PROVENTIL) (2.5 MG/3ML) 0.083% nebulizer solution Take 3 mLs (2.5 mg total) by nebulization every 6 (six) hours as needed for wheezing or shortness of breath. 75 mL 3  . aspirin EC 81 MG tablet Take 1 tablet (81 mg total) by mouth daily. 90  tablet 3  . atorvastatin (LIPITOR) 10 MG tablet Take 10 mg by mouth at bedtime.     . benzonatate (TESSALON) 100 MG capsule Take 1 capsule by mouth 3 (three) times daily as needed.  0  . Calcium Carbonate-Vitamin D (CALCIUM 500 + D PO) Take 1 tablet by mouth every other day.    . fluticasone (FLONASE) 50 MCG/ACT nasal spray USE 2 SPRAY(S) IN EACH NOSTRIL ONCE DAILY 48 g 1  . levothyroxine (SYNTHROID, LEVOTHROID) 88 MCG tablet Take 88 mcg by  mouth daily before breakfast.    . loratadine (EQ ALLERGY RELIEF) 10 MG tablet TAKE 1 TABLET BY MOUTH ONCE DAILY 90 tablet 3  . Multiple Vitamin (MULTIVITAMIN WITH MINERALS) TABS tablet Take 1 tablet by mouth every other day.    . pantoprazole (PROTONIX) 40 MG tablet Take 40 mg by mouth daily.    . Tiotropium Bromide Monohydrate (SPIRIVA RESPIMAT) 2.5 MCG/ACT AERS Inhale 2 puffs into the lungs daily. 1 Inhaler 11  . valsartan (DIOVAN) 160 MG tablet Take 1 tablet by mouth once daily 90 tablet 1   No current facility-administered medications for this visit.    Allergies:  Bactrim [sulfamethoxazole-trimethoprim], Plavix [clopidogrel bisulfate], and Codeine   Social History: The patient  reports that she quit smoking about 2 years ago. Her smoking use included cigarettes. She started smoking about 62 years ago. She has a 10.00 pack-year smoking history. She has never used smokeless tobacco. She reports that she does not drink alcohol or use drugs.   ROS:  Please see the history of present illness. Otherwise, complete review of systems is positive for intermittent leg swelling.  All other systems are reviewed and negative.   Physical Exam: VS:  BP 130/82   Pulse 91   Temp (!) 96.3 F (35.7 C)   Ht 5\' 3"  (1.6 m)   Wt 143 lb (64.9 kg)   SpO2 96%   BMI 25.33 kg/m , BMI Body mass index is 25.33 kg/m.  Wt Readings from Last 3 Encounters:  05/22/19 143 lb (64.9 kg)  11/15/18 133 lb 6.4 oz (60.5 kg)  11/07/18 133 lb (60.3 kg)    General: Elderly woman, appears comfortable at rest. HEENT: Conjunctiva and lids normal, wearing a mask. Neck: Supple, no elevated JVP or carotid bruits, no thyromegaly. Lungs: Clear to auscultation, nonlabored breathing at rest. Cardiac: Decreased, bronchial breath sounds, no S3 or significant systolic murmur, no pericardial rub. Abdomen: Soft, nontender, bowel sounds present. Extremities: Trace ankle edema, distal pulses 2+. Skin: Warm and dry. Musculoskeletal:  No kyphosis. Neuropsychiatric: Alert and oriented x3, affect grossly appropriate.  ECG:  An ECG dated 04/05/2018 was personally reviewed today and demonstrated:  Sinus rhythm with poor R wave progression and low voltage.  Recent Labwork:  No interval lab work for review.  Other Studies Reviewed Today:  Echocardiogram 05/17/2019:  1. The left ventricle has hyperdynamic systolic function, with an ejection fraction of >65%. The cavity size was normal. There is mild concentric left ventricular hypertrophy. Left ventricular diastolic Doppler parameters are consistent with impaired  relaxation. Elevated left ventricular end-diastolic pressure No evidence of left ventricular regional wall motion abnormalities.  2. The mitral valve is degenerative. Mild thickening of the mitral valve leaflet. Mild calcification of the mitral valve leaflet. There is moderate mitral annular calcification present. Mild regurgitation.  3. A 26 Edwards Sapien bioprosthetic aortic valve (TAVR) valve is present in the aortic position.It is functioning normally.  4. The tricuspid valve is grossly normal. Mild  regurgitation.  5. The right ventricle has normal systolic function. The cavity was normal. There is no increase in right ventricular wall thickness.  6. The aorta is normal in size and structure.  Assessment and Plan:  1.  Aortic stenosis status post TAVR.  Recent follow-up echo showed cardiogram shows preserved LVEF with diastolic dysfunction, stable aortic valve prosthetic function.  She continues to use diuretics intermittently for leg swelling.  2.  COPD followed in the Pulmonary division.  She is on regular inhalers, reports a recurring thick sputum production, no fevers or chills. We will obtain a PA and lateral chest x-ray, she has not had any recent imaging studies.  Follow-up arranged with Pulmonary.  3.  History of palpitations, quiescent at this time.  4.  Mixed hyperlipidemia, she continues on Lipitor.   Medication Adjustments/Labs and Tests Ordered: Current medicines are reviewed at length with the patient today.  Concerns regarding medicines are outlined above.   Tests Ordered: Orders Placed This Encounter  Procedures  . EKG 12-Lead    Medication Changes: No orders of the defined types were placed in this encounter.   Disposition:  Follow up 6 months in the [place] office.  Signed, Satira Sark, MD, [facility] 05/22/2019 1:43 PM    [place] [facility] [address], [place], [place] [address] Phone: [phone]; Fax: [phone]

## 2019-05-22 NOTE — Patient Instructions (Addendum)
Medication Instructions:    Your physician recommends that you continue on your current medications as directed. Please refer to the Current Medication list given to you today.  Labwork:  NONE  Testing/Procedures:  A chest x-ray takes a picture of the organs and structures inside the chest, including the heart, lungs, and blood vessels. This test can show several things, including, whether the heart is enlarges; whether fluid is building up in the lungs; and whether pacemaker / defibrillator leads are still in place.  Follow-Up:  Your physician recommends that you schedule a follow-up appointment in: 6 months. You will receive a reminder letter in the mail in about 4 months reminding you to call and schedule your appointment. If you don't receive this letter, please contact our office.  Please follow up with your pulmonologist in 4 weeks.  Any Other Special Instructions Will Be Listed Below (If Applicable).  If you need a refill on your cardiac medications before your next appointment, please call your pharmacy.

## 2019-05-23 ENCOUNTER — Telehealth: Payer: Self-pay | Admitting: *Deleted

## 2019-05-23 NOTE — Telephone Encounter (Signed)
Patient informed. Copy sent to PCP °

## 2019-05-23 NOTE — Telephone Encounter (Signed)
-----   Message from Satira Sark, MD sent at 05/23/2019  8:31 AM EDT ----- Results reviewed.  Please let her know that the chest x-ray did not show any obvious signs of pneumonia.  Changes are consistent with known COPD.

## 2019-06-05 ENCOUNTER — Other Ambulatory Visit: Payer: Self-pay

## 2019-06-05 ENCOUNTER — Encounter: Payer: Self-pay | Admitting: Emergency Medicine

## 2019-06-05 ENCOUNTER — Ambulatory Visit: Payer: Medicare Other | Admitting: Emergency Medicine

## 2019-06-05 VITALS — BP 108/72 | HR 77 | Ht 63.0 in | Wt 142.0 lb

## 2019-06-05 DIAGNOSIS — J449 Chronic obstructive pulmonary disease, unspecified: Secondary | ICD-10-CM

## 2019-06-05 DIAGNOSIS — K2100 Gastro-esophageal reflux disease with esophagitis, without bleeding: Secondary | ICD-10-CM

## 2019-06-05 MED ORDER — TRELEGY ELLIPTA 100-62.5-25 MCG/INH IN AEPB: 1.0000 | INHALATION_SPRAY | Freq: Every day | RESPIRATORY_TRACT | 0 refills | Status: DC

## 2019-06-05 MED ORDER — FAMOTIDINE 20 MG PO TABS: 20.0000 mg | ORAL_TABLET | Freq: Two times a day (BID) | ORAL | 1 refills | Status: DC

## 2019-06-05 NOTE — Progress Notes (Signed)
Patient seen in the office today and instructed on use of Trelegy.  Patient expressed understanding and demonstrated technique.  

## 2019-06-05 NOTE — Progress Notes (Signed)
b Subjective:    Patient ID: April Hull, female    DOB: Jul 01, 1941, 78 y.o.   MRN: [id number]  HPI   ROV 02/15/18 --this is a follow-up visit for history of tobacco use and COPD, chronic cough, some periods of episodic dyspnea.  She returns today reporting that her breathing and cough had been doing better until about 1 week ago when she had more clear rhinitis, cough, clear sputum. She was started on cipro on Friday. She has improved some, cough better, breathing a bit better but still with dyspnea. Ventolin helps the breathing. She has tessalon perles, has helped her. She is on flonase and loratadine.   ROV 11/07/18 --78 year old woman, former smoker with a history of COPD.  She is also followed for history of aortic stenosis, status post TAVR.  She has chronic rhinitis and some clear cough (formally on ACE inhibitor, not currently), on Flonase and loratadine.  She is having much more exertional this visit compared with May. She has gained 15 lbs since last time, is having more trouble with edema. She is on spiriva, uses ventolin more frequently, about 2-3x a day, seems to help her. She is having more nasal drainage to throat, more stuffed on the L, coughs every day, clear mucous. She hasn't seen Dr Diona BrownerMcDowell since our last visit. Not currently on flonase, she is on loratadine.    ROV 06/05/2019--April Hull is a 78 yo female who presents today for follow-up of her chronic cough, COPD, tobacco use disorder and persistent dyspnea. She stated that her dyspnea is persistent and likely worse since her last visit. She had attempted treatment with Stiolto with stopping the Spiriva but did not feel this improved her symptoms notably. She continues to use the Albuterol inhaler at least 2-3 times daily which she feels provides 30-6140min of notable relief and is often sufficient to reduce her symptoms to a tolerable level at that time. She denied increased nasal drainage.    Review of Systems  Constitutional:  Positive for unexpected weight change. Negative for fever.  HENT: Negative for congestion, dental problem, ear pain, nosebleeds, rhinorrhea, sinus pressure, sneezing, sore throat and trouble swallowing.   Eyes: Negative for redness and itching.  Respiratory: Positive for cough (Increased sputum production), shortness of breath and wheezing. Negative for chest tightness.   Cardiovascular: Positive for leg swelling. Negative for palpitations.  Gastrointestinal: Negative for nausea and vomiting.  Genitourinary: Negative for dysuria.  Musculoskeletal: Negative for joint swelling.  Skin: Negative for rash.  Neurological: Negative for headaches.  Hematological: Does not bruise/bleed easily.  Psychiatric/Behavioral: Negative for dysphoric mood. The patient is not nervous/anxious.    Past Medical History:  Diagnosis Date  . Anxiety   . Aortic stenosis   . Colon polyp   . COPD (chronic obstructive pulmonary disease) (HCC)   . Diarrhea   . Essential hypertension   . GERD (gastroesophageal reflux disease)   . History of blood transfusion   . History of pneumonia   . Hypothyroidism   . Mixed hyperlipidemia   . OA (osteoarthritis of the spine)   . Osteoporosis   . 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 Sigmoid diverticulitis      Family History  Problem Relation Age of Onset  . Congestive Heart Failure Mother   . COPD Father   . Emphysema Father   . CAD Brother   . Breast  cancer Sister   . Heart disease Sister   . Uterine cancer Daughter      Social History   Socioeconomic History  . Marital status: Widowed    Spouse name: Not on file  . Number of children: Not on file  . Years of education: Not on file  . Highest education level: Not on file  Occupational History  . Occupation: retired-worked at [employer]  Social Needs  . Financial resource  strain: Not on file  . Food insecurity    Worry: Not on file    Inability: Not on file  . Transportation needs    Medical: Not on file    Non-medical: Not on file  Tobacco Use  . Smoking status: Former Smoker    Packs/day: 0.25    Years: 40.00    Pack years: 10.00    Types: Cigarettes    Start date: 08/04/1956    Quit date: 03/24/2017    Years since quitting: 2.2  . Smokeless tobacco: Never Used  . Tobacco comment: 2 -3  Substance and Sexual Activity  . Alcohol use: No    Alcohol/week: 0.0 standard drinks  . Drug use: No  . Sexual activity: Never  Lifestyle  . Physical activity    Days per week: Not on file    Minutes per session: Not on file  . Stress: Not on file  Relationships  . Social Herbalist on phone: Not on file    Gets together: Not on file    Attends religious service: Not on file    Active member of club or organization: Not on file    Attends meetings of clubs or organizations: Not on file    Relationship status: Not on file  . Intimate partner violence    Fear of current or ex partner: Not on file    Emotionally abused: Not on file    Physically abused: Not on file    Forced sexual activity: Not on file  Other Topics Concern  . Not on file  Social History Narrative  . Not on file     Allergies  Allergen Reactions  . Bactrim [Sulfamethoxazole-Trimethoprim] Hives, Itching and Other (See Comments)    Irregular heart beat  . Plavix [Clopidogrel Bisulfate] Hives    Rash  . Codeine Palpitations    Dizziness, sweats      Outpatient Medications Prior to Visit  Medication Sig Dispense Refill  . albuterol (PROVENTIL) (2.5 MG/3ML) 0.083% nebulizer solution Take 3 mLs (2.5 mg total) by nebulization every 6 (six) hours as needed for wheezing or shortness of breath. 75 mL 3  . aspirin EC 81 MG tablet Take 1 tablet (81 mg total) by mouth daily. 90 tablet 3  . atorvastatin (LIPITOR) 10 MG tablet Take 10 mg by mouth at bedtime.     . benzonatate  (TESSALON) 100 MG capsule Take 1 capsule by mouth 3 (three) times daily as needed.  0  . Calcium Carbonate-Vitamin D (CALCIUM 500 + D PO) Take 1 tablet by mouth every other day.    . fluticasone (FLONASE) 50 MCG/ACT nasal spray USE 2 SPRAY(S) IN EACH NOSTRIL ONCE DAILY 48 g 1  . levothyroxine (SYNTHROID, LEVOTHROID) 88 MCG tablet Take 88 mcg by mouth daily before breakfast.    . loratadine (EQ ALLERGY RELIEF) 10 MG tablet TAKE 1 TABLET BY MOUTH ONCE DAILY 90 tablet 3  . Multiple Vitamin (MULTIVITAMIN WITH MINERALS) TABS tablet Take 1 tablet by mouth every  other day.    . pantoprazole (PROTONIX) 40 MG tablet Take 40 mg by mouth daily.    . Tiotropium Bromide Monohydrate (SPIRIVA RESPIMAT) 2.5 MCG/ACT AERS Inhale 2 puffs into the lungs daily. 1 Inhaler 11  . valsartan (DIOVAN) 160 MG tablet Take 1 tablet by mouth once daily 90 tablet 1   No facility-administered medications prior to visit.       Objective:   Physical Exam Vitals:   06/05/19 1055  BP: 108/72  Pulse: 77  SpO2: 97%  Weight: 142 lb (64.4 kg)  Height: 5\' 3"  (1.6 m)   General: A/O x4, in no acute distress, afebrile, nondiaphoretic HEENT: PEERL, EMO intact Cardio: RRR, no mrg's  Pulmonary: CTA bilaterally, no wheezing or crackles  Abdomen: Bowel sounds normal, soft, nontender  MSK: BLE nontender, nonedematous Neuro: Alert, conversational, normal gait Psych: Appropriate affect, not depressed in appearance, engages well     Assessment & Plan:  GERD (gastroesophageal reflux disease) Given her chronic cough and history of GERD with her admission today that this is poorly controlled on protonix 40mg  daily alone we will add pepcid in an effort to further treat this in an effort to more comprehensively treat her chronic cough.   COPD (chronic obstructive pulmonary disease) The patient stated that the Stiolto did not provide marked relief. As such, she resumed taking her Spiriva and using the PRN albuterol but she does not feel  this is sufficient alone. As she endorses relief from the Albuterol we continue to feel that addition of a LABA will benefit.  A trial of Trelegy provided in the clinic today will be attempted with a return visit in two months to evaluate for improvement.   Lanelle BalLawrence Ferrell Flam, MD [facility], PGY-3  Please see A/P and/or Addendum for final recommendations by: Dr. Delton CoombesByrum    Attending Note:  I have examined patient, reviewed labs, studies and notes.   This follow-up visit for dyspnea in a patient with severe COPD, aortic stenosis with history of TAVR.  She had some increased edema and I questioned whether her cardiac status was well compensated.  She had a reassuring echocardiogram done on 05/17/2019.  No adjustments have been made to her diabetic regimen.  I also tried changing her Spiriva to Stiolto to see if she get more benefit.  She reports that she did not feel any different with that change and is gone back to the Spiriva.  In all she believes that her breathing is still not optimized.  She continues to have cough, sputum production.  She did not desaturate on walking oximetry in January.  We will try substituting Trelegy for Spiriva to see if she gets more benefit.  She knows to rinse and gargle after using.  I will add Pepcid to her Protonix given her persistent cough in the setting of some breakthrough GERD.  We will follow-up in about 2 months to see if she is gotten any improvement.  She may benefit from pulmonary rehabilitation, [facility].  We will revisit this next time.   Levy Pupaobert Byrum, MD, PhD 06/05/2019, 5:20 PM [facility] [phone] or if no answer [phone]

## 2019-06-05 NOTE — Patient Instructions (Signed)
It was a pleasure meeting your today. Please schedule a return visit in two months with Dr. Lamonte Sakai.  We will stop the Spiriva and Start Trelegy today. We will give you sufficient samples to last until your next appointment with Dr. Lamonte Sakai. If this does not work by that time please let us know and he will consider additional changes.   Please consider pulmonary rehabilitation at [facility] as this will provide marked improvement in your symptoms. We will arrange this for you.   With regard to your acid reflux, we will start an additional medication known as Pepcid. Please take 20mg  two times daily. If this helps, we can refill this at your next appointment.

## 2019-06-05 NOTE — Assessment & Plan Note (Signed)
Given her chronic cough and history of GERD with her admission today that this is poorly controlled on protonix 40mg  daily alone we will add pepcid in an effort to further treat this in an effort to more comprehensively treat her chronic cough.

## 2019-06-05 NOTE — Assessment & Plan Note (Addendum)
She underwent a thorough cardiac evaluation with essentially uncharged ECHO, EKG and unchanged CXR. Given her lack of typical CHF symptoms to explain her presentation I feel this is likely related to her COPD. The patient stated that the Stiolto did not provide marked relief when she completed a trial of this following a more recent visit. As such, she resumed taking her Spiriva and using the PRN albuterol but she does not feel this is sufficient alone. Given that she endorsed relief from the Albuterol I feel that addition of a LABA will benefit the patient.  As such, a trial of Trelegy provided in the clinic today will be attempted with a return visit in two months to evaluate for improvement.

## 2019-06-07 ENCOUNTER — Telehealth: Payer: Self-pay | Admitting: Emergency Medicine

## 2019-06-07 NOTE — Telephone Encounter (Signed)
Returned call to patient and notified per RB's documentation that pulm rehab will be addressed next visit. Pt verbalized understanding nothing further needed.   We will try substituting Trelegy for Spiriva to see if she gets more benefit.  She knows to rinse and gargle after using.  I will add Pepcid to her Protonix given her persistent cough in the setting of some breakthrough GERD.  We will follow-up in about 2 months to see if she is gotten any improvement.  She may benefit from pulmonary rehabilitation, could get this done at [facility].  We will revisit this next time.

## 2019-06-27 ENCOUNTER — Telehealth: Payer: Self-pay | Admitting: Emergency Medicine

## 2019-06-27 MED ORDER — TRELEGY ELLIPTA 100-62.5-25 MCG/INH IN AEPB: 1.0000 | INHALATION_SPRAY | Freq: Every day | RESPIRATORY_TRACT | 3 refills | Status: DC

## 2019-06-27 NOTE — Telephone Encounter (Signed)
Called and spoke with Patient.  Patient stated Dr. Lamonte Sakai started her on Trelegy at her last OV.  Patient stated Trelegy has worked really good for her.  Patient requested a Trelegy prescription to be sent to [facility] in [place]. Prescription sent to requested pharmacy.  Nothing further at this time.

## 2019-07-17 ENCOUNTER — Other Ambulatory Visit: Payer: Self-pay | Admitting: Emergency Medicine

## 2019-08-06 ENCOUNTER — Other Ambulatory Visit: Payer: Self-pay

## 2019-08-06 ENCOUNTER — Ambulatory Visit: Payer: Medicare Other | Admitting: Emergency Medicine

## 2019-08-06 ENCOUNTER — Encounter: Payer: Self-pay | Admitting: Emergency Medicine

## 2019-08-06 DIAGNOSIS — K2100 Gastro-esophageal reflux disease with esophagitis, without bleeding: Secondary | ICD-10-CM | POA: Diagnosis not present

## 2019-08-06 DIAGNOSIS — R05 Cough: Secondary | ICD-10-CM

## 2019-08-06 DIAGNOSIS — J301 Allergic rhinitis due to pollen: Secondary | ICD-10-CM | POA: Diagnosis not present

## 2019-08-06 DIAGNOSIS — Z23 Encounter for immunization: Secondary | ICD-10-CM | POA: Diagnosis not present

## 2019-08-06 DIAGNOSIS — J449 Chronic obstructive pulmonary disease, unspecified: Secondary | ICD-10-CM

## 2019-08-06 DIAGNOSIS — R058 Other specified cough: Secondary | ICD-10-CM

## 2019-08-06 MED ORDER — TRELEGY ELLIPTA 100-62.5-25 MCG/INH IN AEPB: 1.0000 | INHALATION_SPRAY | Freq: Every day | RESPIRATORY_TRACT | 11 refills | Status: DC

## 2019-08-06 NOTE — Progress Notes (Signed)
  b Subjective:    Patient ID: April Hull, female    DOB: [birth year], 78 y.o.   MRN: [id number]  HPI   ROV 08/06/2019 --78 year old woman with a history of aortic stenosis, TAVR, COPD with chronic cough in the setting of chronic rhinitis.  At her last visit we suspected that she also has breakthrough GERD even on Protonix so we added Pepcid to this.  We also tried changing her from Spiriva to Trelegy to see if she get more benefit.  She reports today that her breathing is better, she is able to do some of her housework but not all. She has been walking around her neighborhood. She continues to have significant acid reflux >> is now trying to modify her diet to help w this. Remains on protonix + pepcid. Her cough is better with the Trelegy. Uses albuterol vary rarely now. Needs flu shot and PNA vaccine    Review of Systems  Constitutional: Positive for unexpected weight change. Negative for fever.  HENT: Negative for congestion, dental problem, ear pain, nosebleeds, rhinorrhea, sinus pressure, sneezing, sore throat and trouble swallowing.   Eyes: Negative for redness and itching.  Respiratory: Positive for shortness of breath. Negative for cough (Increased sputum production), chest tightness and wheezing.   Cardiovascular: Positive for leg swelling. Negative for palpitations.  Gastrointestinal: Negative for nausea and vomiting.  Genitourinary: Negative for dysuria.  Musculoskeletal: Negative for joint swelling.  Skin: Negative for rash.  Neurological: Negative for headaches.  Hematological: Does not bruise/bleed easily.  Psychiatric/Behavioral: Negative for dysphoric mood. The patient is not nervous/anxious.      Objective:   Physical Exam Vitals:   08/06/19 1014  BP: 140/88  Pulse: 91  SpO2: 96%  Weight: 146 lb (66.2 kg)  Height: 5\' 3"  (1.6 m)   Gen: Pleasant, well-nourished, in no distress,  normal affect  ENT: No lesions,  mouth clear,  oropharynx clear, no postnasal drip   Neck: No JVD, no stridor  Lungs: No use of accessory muscles, no crackles or wheezing on normal respiration, no wheeze on forced expiration  Cardiovascular: RRR, heart sounds normal, no murmur or gallops, no peripheral edema  Musculoskeletal: No deformities, no cyanosis or clubbing  Neuro: alert, awake, non focal  Skin: Warm, no lesions or rash      Assessment & Plan:  COPD (chronic obstructive pulmonary disease) Significant benefit from the switch to Trelegy.  This includes benefit in her cough, question whether some of this may have been her obstructive lung disease.  Rare albuterol use.  We will continue the same regimen.  She needs a flu shot today.  She is also due for one of the pneumonia vaccines, we will do this at her next visit.  Allergic rhinitis Plan to continue same regimen, Flonase and Allegra  GERD (gastroesophageal reflux disease) This is still an active problem.  She is on PPI plus Pepcid.  Most recently she is been working hard on trying to modify her diet  Upper airway cough syndrome Much better on her current regimen.  We will continue to try and treat allergies, GERD as aggressively as possible.    Baltazar Apo, MD, PhD 08/06/2019, 10:48 AM [facility] [phone] or if no answer [phone]

## 2019-08-06 NOTE — Assessment & Plan Note (Signed)
Plan to continue same regimen, Flonase and Allegra

## 2019-08-06 NOTE — Assessment & Plan Note (Signed)
Significant benefit from the switch to Trelegy.  This includes benefit in her cough, question whether some of this may have been her obstructive lung disease.  Rare albuterol use.  We will continue the same regimen.  She needs a flu shot today.  She is also due for one of the pneumonia vaccines, we will do this at her next visit.

## 2019-08-06 NOTE — Assessment & Plan Note (Signed)
Much better on her current regimen.  We will continue to try and treat allergies, GERD as aggressively as possible.

## 2019-08-06 NOTE — Patient Instructions (Addendum)
We will continue Trelegy 1 inhalation once daily.  Please rinse and gargle after using. Keep albuterol available to use either one nebulizer treatment or 2 puffs up to every 4 hours if needed for shortness of breath, chest tightness, wheezing. Flu shot today. We will give you your pneumonia vaccine at your next office visit Please continue Protonix and Pepcid, your modified diet to prevent reflux as you have been doing them. Please continue Flonase and Allegra Follow with Dr Lamonte Sakai in 6 months or sooner if you have any problems

## 2019-08-06 NOTE — Assessment & Plan Note (Signed)
This is still an active problem.  She is on PPI plus Pepcid.  Most recently she is been working hard on trying to modify her diet

## 2019-08-27 ENCOUNTER — Ambulatory Visit (INDEPENDENT_AMBULATORY_CARE_PROVIDER_SITE_OTHER): Payer: Medicare Other | Admitting: Primary Care

## 2019-08-27 ENCOUNTER — Encounter: Payer: Self-pay | Admitting: Primary Care

## 2019-08-27 ENCOUNTER — Telehealth: Payer: Self-pay | Admitting: Emergency Medicine

## 2019-08-27 DIAGNOSIS — J449 Chronic obstructive pulmonary disease, unspecified: Secondary | ICD-10-CM

## 2019-08-27 MED ORDER — SPIRIVA RESPIMAT 2.5 MCG/ACT IN AERS: 2.0000 | INHALATION_SPRAY | Freq: Every day | RESPIRATORY_TRACT | 6 refills | Status: DC

## 2019-08-27 NOTE — Progress Notes (Signed)
Virtual Visit via Telephone Note  I connected with April Hull on 08/27/19 at 10:30 AM EST by telephone and verified that I am speaking with the correct person using two identifiers.  Location: Patient: Home Provider: Office   I discussed the limitations, risks, security and privacy concerns of performing an evaluation and management service by telephone and the availability of in person appointments. I also discussed with the patient that there may be a patient responsible charge related to this service. The patient expressed understanding and agreed to proceed.   History of Present Illness: 78 year old female, former smoker quit 2018. PMH significant for COPD, allergic rhinitis, upper airway cough syndrome, GERD, HTN, severe aortic stenosis, TAVR. Patient of Dr. Lamonte Sakai, last seen on 08/06/19. Maintained on Trelegy since August 2020.   08/27/2019 Patient contact today for acute televisit. She has been having issues with her vision since starting Trelegy. Associated change in taste, N/V and headaches. She has a hx of glaucoma and follows with opthalmology every 3 month. Next apt is in December. She had reported benefit in breathing with Trelegy but would like to return to using Spiriva. No known sick contacts. Denies fever, chills, sweats.    Observations/Objective:  - No shortness of breath, wheezing or cough  Assessment and Plan:  COPD - Reports vision changes, nausea/vomitting and HA with Trelegy - Failed Stiolto in the past  - Resume Spiriva Respimat 2.5 two puff once daily; PRN albuterol q 4-6 hours - Instrtucted to notify office if SE's from Trelegy do not improve with discontinuation of medication or if breathing worsens   Follow Up Instructions:  - FU in 5-6 months as previously recommended OR sooner if needed    I discussed the assessment and treatment plan with the patient. The patient was provided an opportunity to ask questions and all were answered. The patient agreed  with the plan and demonstrated an understanding of the instructions.   The patient was advised to call back or seek an in-person evaluation if the symptoms worsen or if the condition fails to improve as anticipated.  I provided 18 minutes of non-face-to-face time during this encounter.   Martyn Ehrich, NP

## 2019-08-27 NOTE — Patient Instructions (Signed)
Pleasure speaking with you today April Hull  Recommendations: - Stop Trelegy due to side effects - Resume Spiriva 2 puffs once daily; Albuterol every 4-6 hours as needed for breakthrough shortness of breath - If vision change, nausea and headache do not improve with discontinuation of Trelegy please notify office or PCP  Follow-up - 5-6 months as previously recommended with Dr. Burnis Kingfisher - Or sooner if breathing symptoms change or worsen

## 2019-08-27 NOTE — Telephone Encounter (Signed)
Call returned to patient, confirmed DOB. She states she started the trelegy 2 months ago. She states when she first started it she vomited but she thought it would get better over time. She states she had some type of abdominal surgery and she thought it was her stomach. She states for 2 months she has been consistently vomiting, having headaches, joint pain, and cataracts. I told the patient to stop the trelegy until she speaks with a provider. Tele-visit appt made. Nothing further needed at this time.

## 2019-08-27 NOTE — Telephone Encounter (Signed)
pt has been on Trelegy - Pt is worried about the side effects - since she has been on Trelegy, she has been throwing up, severe headaches, joint pain,bad taste in mouth, pt has glacoma, cataracts and has been seeing halos and having eye pain - blurred vision - wants to talk about going back on Spiriva - CB# [phone]

## 2019-08-31 ENCOUNTER — Telehealth: Payer: Self-pay | Admitting: Emergency Medicine

## 2019-08-31 NOTE — Telephone Encounter (Signed)
Yes, she can resume. We can give her two samples. It is unlikely they were coming from inhaler but HA and glaucoma/cataracts (long term use) is listed an a possible side effect. Needs to follow-up with opthamology and PCP.

## 2019-08-31 NOTE — Telephone Encounter (Signed)
Pt received call from pharmacy and Trelogy will be $153.  Pt asking if she can get samples until January due to insurance.  Please advise. [phone].

## 2019-08-31 NOTE — Telephone Encounter (Signed)
We currently only have samples of Trelegy 200.   Are you ok with 200 instead of 100?  Message routed to [place], NP

## 2019-08-31 NOTE — Telephone Encounter (Signed)
Called and spoke with Patient.  Patient stated she was seen by Eustaquio Maize, NP, 08/27/19 and discussed possible side effects to Trelegy.  Patient stated Spiriva has not helped her sob, she feels like her breathing is worse since starting Spiriva. Patient stated she is wanting to start  back on Trelegy.  Patient stated she is having the same symptoms she was having before she stopped Trelegy.  Patient stated she has OV with eye doctor 10/01/19 for glaucoma.  Message routed to [place], NP

## 2019-08-31 NOTE — Telephone Encounter (Signed)
No that is not the appropriate medication for her unfortunately. She can check back next week to see if we have samples.

## 2019-08-31 NOTE — Telephone Encounter (Signed)
Pt aware of recs per Beth  Nothing further needed

## 2019-10-20 ENCOUNTER — Other Ambulatory Visit: Payer: Self-pay | Admitting: Emergency Medicine

## 2019-10-26 ENCOUNTER — Telehealth: Payer: Self-pay | Admitting: Cardiology

## 2019-10-26 NOTE — Telephone Encounter (Signed)
Patient called in regards to taking the covid vaccine. She was told to check with Dr. Diona Browner in regards to the s/p TARV done on 04/05/2017.

## 2019-10-26 NOTE — Telephone Encounter (Signed)
Yes, she is a candidate for the coronavirus vaccine.

## 2019-10-26 NOTE — Telephone Encounter (Signed)
COVID vaccine is ok to take correct?

## 2019-10-26 NOTE — Telephone Encounter (Signed)
Pt aware.

## 2019-11-02 ENCOUNTER — Other Ambulatory Visit: Payer: Self-pay | Admitting: Emergency Medicine

## 2019-11-08 ENCOUNTER — Telehealth: Payer: Self-pay | Admitting: Cardiology

## 2019-11-08 NOTE — Telephone Encounter (Signed)
Virtual Visit Pre-Appointment Phone Call  "(Name), I am calling you today to discuss your upcoming appointment. We are currently trying to limit exposure to the virus that causes COVID-19 by seeing patients at home rather than in the office."  1. "What is the BEST phone number to call the day of the visit?" - include this in appointment notes  2. "Do you have or have access to (through a family member/friend) a smartphone with video capability that we can use for your visit?" a. If yes - list this number in appt notes as "cell" (if different from BEST phone #) and list the appointment type as a VIDEO visit in appointment notes b. If no - list the appointment type as a PHONE visit in appointment notes  3. Confirm consent - "In the setting of the current Covid19 crisis, you are scheduled for a (phone or video) visit with your provider on (date) at (time).  Just as we do with many in-office visits, in order for you to participate in this visit, we must obtain consent.  If you'd like, I can send this to your mychart (if signed up) or email for you to review.  Otherwise, I can obtain your verbal consent now.  All virtual visits are billed to your insurance company just like a normal visit would be.  By agreeing to a virtual visit, we'd like you to understand that the technology does not allow for your provider to perform an examination, and thus may limit your provider's ability to fully assess your condition. If your provider identifies any concerns that need to be evaluated in person, we will make arrangements to do so.  Finally, though the technology is pretty good, we cannot assure that it will always work on either your or our end, and in the setting of a video visit, we may have to convert it to a phone-only visit.  In either situation, we cannot ensure that we have a secure connection.  Are you willing to proceed?" STAFF: Did the patient verbally acknowledge consent to telehealth visit? Document  YES/NO here: YES  4. Advise patient to be prepared - "Two hours prior to your appointment, go ahead and check your blood pressure, pulse, oxygen saturation, and your weight (if you have the equipment to check those) and write them all down. When your visit starts, your provider will ask you for this information. If you have an Apple Watch or Kardia device, please plan to have heart rate information ready on the day of your appointment. Please have a pen and paper handy nearby the day of the visit as well."  5. Give patient instructions for MyChart download to smartphone OR Doximity/[email or web address] as below if video visit (depending on what platform provider is using)  6. Inform patient they will receive a phone call 15 minutes prior to their appointment time (may be from unknown caller ID) so they should be prepared to answer    TELEPHONE CALL NOTE  April Hull has been deemed a candidate for a follow-up tele-health visit to limit community exposure during the Covid-19 pandemic. I spoke with the patient via phone to ensure availability of phone/video source, confirm preferred email & phone number, and discuss instructions and expectations.  I reminded April Hull to be prepared with any vital sign and/or heart rhythm information that could potentially be obtained via home monitoring, at the time of her visit. I reminded April Hull to expect a phone call prior to  her visit.  [name] 11/08/2019 2:44 PM   INSTRUCTIONS FOR DOWNLOADING THE MYCHART APP TO SMARTPHONE  - The patient must first make sure to have activated MyChart and know their login information - If Apple, go to [employer] and type in MyChart in the search bar and download the app. If Android, ask patient to go to [employer] and type in [place] in the search bar and download the app. The app is free but as with any other app downloads, their phone may require them to verify saved payment information or Apple/Android  password.  - The patient will need to then log into the app with their MyChart username and password, and select [facility] as their healthcare provider to link the account. When it is time for your visit, go to the MyChart app, find appointments, and click Begin Video Visit. Be sure to Select Allow for your device to access the Microphone and Camera for your visit. You will then be connected, and your provider will be with you shortly.  **If they have any issues connecting, or need assistance please contact MyChart service desk [phone])**  **If using a computer, in order to ensure the best quality for their visit they will need to use either of the following Internet Browsers: [employer] or Google Chrome**  IF USING DOXIMITY or DOXY.ME - The patient will receive a link just prior to their visit by text.     FULL LENGTH CONSENT FOR TELE-HEALTH VISIT   I hereby voluntarily request, consent and authorize [facility] and its employed or contracted physicians, physician assistants, nurse practitioners or other licensed health care professionals (the Practitioner), to provide me with telemedicine health care services (the "Services") as deemed necessary by the treating Practitioner. I acknowledge and consent to receive the Services by the Practitioner via telemedicine. I understand that the telemedicine visit will involve communicating with the Practitioner through live audiovisual communication technology and the disclosure of certain medical information by electronic transmission. I acknowledge that I have been given the opportunity to request an in-person assessment or other available alternative prior to the telemedicine visit and am voluntarily participating in the telemedicine visit.  I understand that I have the right to withhold or withdraw my consent to the use of telemedicine in the course of my care at any time, without affecting my right to future care or treatment,  and that the Practitioner or I may terminate the telemedicine visit at any time. I understand that I have the right to inspect all information obtained and/or recorded in the course of the telemedicine visit and may receive copies of available information for a reasonable fee.  I understand that some of the potential risks of receiving the Services via telemedicine include:  Marland Kitchen Delay or interruption in medical evaluation due to technological equipment failure or disruption; . Information transmitted may not be sufficient (e.g. poor resolution of images) to allow for appropriate medical decision making by the Practitioner; and/or  . In rare instances, security protocols could fail, causing a breach of personal health information.  Furthermore, I acknowledge that it is my responsibility to provide information about my medical history, conditions and care that is complete and accurate to the best of my ability. I acknowledge that Practitioner's advice, recommendations, and/or decision may be based on factors not within their control, such as incomplete or inaccurate data provided by me or distortions of diagnostic images or specimens that may result from electronic transmissions. I  understand that the practice of medicine is not an exact science and that Practitioner makes no warranties or guarantees regarding treatment outcomes. I acknowledge that I will receive a copy of this consent concurrently upon execution via email to the email address I last provided but may also request a printed copy by calling the office of [facility].    I understand that my insurance will be billed for this visit.   I have read or had this consent read to me. . I understand the contents of this consent, which adequately explains the benefits and risks of the Services being provided via telemedicine.  . I have been provided ample opportunity to ask questions regarding this consent and the Services and have had my questions  answered to my satisfaction. . I give my informed consent for the services to be provided through the use of telemedicine in my medical care  By participating in this telemedicine visit I agree to the above.

## 2019-11-19 ENCOUNTER — Telehealth (INDEPENDENT_AMBULATORY_CARE_PROVIDER_SITE_OTHER): Payer: Medicare Other | Admitting: Cardiology

## 2019-11-19 ENCOUNTER — Encounter: Payer: Self-pay | Admitting: *Deleted

## 2019-11-19 ENCOUNTER — Encounter: Payer: Self-pay | Admitting: Cardiology

## 2019-11-19 VITALS — BP 135/69 | HR 70 | Ht 63.0 in | Wt 140.0 lb

## 2019-11-19 DIAGNOSIS — I5189 Other ill-defined heart diseases: Secondary | ICD-10-CM

## 2019-11-19 DIAGNOSIS — Z952 Presence of prosthetic heart valve: Secondary | ICD-10-CM

## 2019-11-19 DIAGNOSIS — I350 Nonrheumatic aortic (valve) stenosis: Secondary | ICD-10-CM

## 2019-11-19 DIAGNOSIS — J449 Chronic obstructive pulmonary disease, unspecified: Secondary | ICD-10-CM | POA: Diagnosis not present

## 2019-11-19 DIAGNOSIS — E782 Mixed hyperlipidemia: Secondary | ICD-10-CM | POA: Diagnosis not present

## 2019-11-19 DIAGNOSIS — I10 Essential (primary) hypertension: Secondary | ICD-10-CM

## 2019-11-19 NOTE — Patient Instructions (Signed)
Medication Instructions:  Continue all current medications.  Labwork: none  Testing/Procedures: Your physician has requested that you have an echocardiogram. Echocardiography is a painless test that uses sound waves to create images of your heart. It provides your doctor with information about the size and shape of your heart and how well your heart's chambers and valves are working. This procedure takes approximately one hour. There are no restrictions for this procedure. (DUE IN 6 MONTHS JUST PRIOR TO NEXT OFFICE VISIT)  Follow-Up: Your physician wants you to follow up in: 6 months.  You will receive a reminder letter in the mail one-two months in advance.  If you don't receive a letter, please call our office to schedule the follow up appointment   Any Other Special Instructions Will Be Listed Below (If Applicable).  If you need a refill on your cardiac medications before your next appointment, please call your pharmacy.  

## 2019-11-19 NOTE — Progress Notes (Signed)
Virtual Visit via Telephone Note   This visit type was conducted due to national recommendations for restrictions regarding the COVID-19 Pandemic (e.g. social distancing) in an effort to limit this patient's exposure and mitigate transmission in our community.  Due to her co-morbid illnesses, this patient is at least at moderate risk for complications without adequate follow up.  This format is felt to be most appropriate for this patient at this time.  The patient did not have access to video technology/had technical difficulties with video requiring transitioning to audio format only (telephone).  All issues noted in this document were discussed and addressed.  No physical exam could be performed with this format.  Please refer to the patient's chart for her  consent to telehealth for [facility].   Date:  11/19/2019   ID:  April Hull, DOB 1941-05-12, MRN [id number]  Patient Location: Home Provider Location: Office  PCP:  [name], MD  Cardiologist:  [name], MD Electrophysiologist:  None   Evaluation Performed:  Follow-Up Visit  Chief Complaint:  Cardiac follow-up  History of Present Illness:    April Hull is an 79 y.o. female last seen in August 2020.  We spoke by phone today.  She states that she has been doing well, remains functional with ADLs, wears a mask when she goes out of the house.  No change in chronic dyspnea on exertion.  I reviewed her medications which are stable and outlined below.  Cardiac regimen includes aspirin, Lipitor, and Diovan.  We plan to request her most recent lab work from Dr. Sherril Croon.  Follow-up echocardiogram from July 2020 as outlined below.  TAVR prosthesis functioning normally at that time.  The patient does not have symptoms concerning for COVID-19 infection (fever, chills, cough, or new shortness of breath).  She states that she got the first injection of the vaccine.   Past Medical History:  Diagnosis Date  . Anxiety   .  Aortic stenosis   . Colon polyp   . COPD (chronic obstructive pulmonary disease) (HCC)   . Diarrhea   . Essential hypertension   . GERD (gastroesophageal reflux disease)   . History of blood transfusion   . History of pneumonia   . Hypothyroidism   . Mixed hyperlipidemia   . OA (osteoarthritis of the spine)   . Osteoporosis   . 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 Sigmoid diverticulitis    Past Surgical History:  Procedure Laterality Date  . ABDOMINAL HYSTERECTOMY  1972  . APPENDECTOMY    . COLONOSCOPY  10/15/05  . COLOSTOMY N/A 11/27/2014   Procedure: COLOSTOMY;  Surgeon: Harriette Bouillon, MD;  Location: [facility] OR;  Service: General;  Laterality: N/A;  . COLOSTOMY TAKEDOWN N/A 04/09/2015   Procedure: LAPAROSCOPIC ASSISTED COLOSTOMY CLOSURE; RIGID SIGMOIDOSCOPY;  Surgeon: Harriette Bouillon, MD;  Location: [facility] OR;  Service: General;  Laterality: N/A;  . ESOPHAGOGASTRODUODENOSCOPY  10/15/05  . hammer toes Bilateral    6  . LAPAROSCOPIC SIGMOID COLECTOMY N/A 11/27/2014   Procedure: LAPAROSCOPIC ASSISTED SIGMOID COLECTOMY;  Surgeon: Harriette Bouillon, MD;  Location: [facility] OR;  Service: General;  Laterality: N/A;  . LUMBAR DISC SURGERY     x 2  . RIGHT/LEFT HEART CATH AND CORONARY ANGIOGRAPHY N/A 03/04/2017   Procedure: Right/Left Heart Cath and Coronary Angiography;  Surgeon: Kathleene Hazel, MD;  Location: [facility] INVASIVE CV LAB;  Service: Cardiovascular;  Laterality: N/A;  . ROTATOR CUFF REPAIR Left 2009  . TEE WITHOUT CARDIOVERSION N/A 04/05/2017   Procedure: TRANSESOPHAGEAL ECHOCARDIOGRAM (TEE);  Surgeon: Kathleene Hazel, MD;  Location: [facility] OR;  Service: Open Heart Surgery;  Laterality: N/A;  . TRANSCATHETER AORTIC VALVE REPLACEMENT, TRANSFEMORAL N/A 04/05/2017   Procedure: TRANSCATHETER AORTIC VALVE REPLACEMENT, TRANSFEMORAL;  Surgeon: Kathleene Hazel,  MD;  Location: [facility] OR;  Service: Open Heart Surgery;  Laterality: N/A;     Current Meds  Medication Sig  . albuterol (PROVENTIL) (2.5 MG/3ML) 0.083% nebulizer solution Take 3 mLs (2.5 mg total) by nebulization every 6 (six) hours as needed for wheezing or shortness of breath.  Marland Kitchen aspirin EC 81 MG tablet Take 1 tablet (81 mg total) by mouth daily.  Marland Kitchen atorvastatin (LIPITOR) 10 MG tablet Take 10 mg by mouth at bedtime.   . Calcium Carbonate-Vitamin D (CALCIUM 500 + D PO) Take 1 tablet by mouth every other day.  . famotidine (PEPCID) 20 MG tablet Take 1 tablet (20 mg total) by mouth 2 (two) times daily.  . fluticasone (FLONASE) 50 MCG/ACT nasal spray Use 2 spray(s) in each nostril once daily  . levothyroxine (SYNTHROID, LEVOTHROID) 88 MCG tablet Take 88 mcg by mouth daily before breakfast.  . loratadine (CLARITIN) 10 MG tablet TAKE 1 TABLET BY MOUTH ONCE DAILY  . Multiple Vitamin (MULTIVITAMIN WITH MINERALS) TABS tablet Take 1 tablet by mouth every other day.  . pantoprazole (PROTONIX) 40 MG tablet Take 40 mg by mouth daily.  . Tiotropium Bromide Monohydrate (SPIRIVA RESPIMAT) 2.5 MCG/ACT AERS Inhale 2 puffs into the lungs daily.  . valsartan (DIOVAN) 160 MG tablet Take 1 tablet by mouth once daily     Allergies:   Bactrim [sulfamethoxazole-trimethoprim], Plavix [clopidogrel bisulfate], and Codeine   Social History   Tobacco Use  . Smoking status: Former Smoker    Packs/day: 0.25    Years: 40.00    Pack years: 10.00    Types: Cigarettes    Start date: 08/04/1956    Quit date: 03/24/2017    Years since quitting: 2.6  . Smokeless tobacco: Never Used  . Tobacco comment: 2 -3  Substance Use Topics  . Alcohol use: No    Alcohol/week: 0.0 standard drinks  . Drug use: No     Family Hx: The patient's family history includes Breast cancer in her sister; CAD in her brother; COPD in her father; Congestive Heart Failure in her mother; Emphysema in her father; Heart disease in her sister; Uterine  cancer in her daughter.  ROS:   Please see the history of present illness. All other systems reviewed and are negative.   Prior CV studies:   The following studies were reviewed today:  Echocardiogram 05/17/2019: 1. The left ventricle has hyperdynamic systolic function, with an  ejection fraction of >65%. The cavity size was normal. There is mild  concentric left ventricular hypertrophy. Left ventricular diastolic  Doppler parameters are consistent with impaired  relaxation. Elevated left ventricular end-diastolic pressure No evidence  of left ventricular regional wall motion abnormalities.  2. The mitral valve is degenerative. Mild thickening of the mitral valve  leaflet. Mild calcification of the mitral valve leaflet. There is moderate  mitral annular calcification present. Mild regurgitation.  3. A 26 Edwards Sapien bioprosthetic aortic valve (TAVR) valve is present  in the aortic position.It is functioning normally.  4. The tricuspid valve is grossly normal. Mild regurgitation.  5. The right ventricle has normal systolic function. The  cavity was  normal. There is no increase in right ventricular wall thickness.  6. The aorta is normal in size and structure.  Chest x-ray 05/22/2019: FINDINGS: Prior aortic valve repair. There is hyperinflation of the lungs compatible with COPD. Scarring in the upper lobes and bases. No confluent airspace opacities or effusions. Heart is normal size.  IMPRESSION: COPD/chronic changes.  No active disease.  Labs/Other Tests and Data Reviewed:    EKG:  An ECG dated 05/22/2019 was personally reviewed today and demonstrated:  Sinus rhythm with low voltage.  Recent Labs:  No interval lab work for review today.  Wt Readings from Last 3 Encounters:  11/19/19 140 lb (63.5 kg)  08/06/19 146 lb (66.2 kg)  06/05/19 142 lb (64.4 kg)     Objective:    Vital Signs:  BP 135/69   Pulse 70   Ht 5\' 3"  (1.6 m)   Wt 140 lb (63.5 kg)   BMI 24.80  kg/m    Patient spoke in full sentences, not short of breath. No audible wheezing or coughing. Speech pattern normal.  ASSESSMENT & PLAN:    1.  Aortic stenosis status post TAVR.  She is symptomatically stable, we will plan on a follow-up echocardiogram in 6 months.  2.  COPD with follow-up by Pulmonary.  No change in chronic dyspnea on exertion.  3.  Mixed hyperlipidemia, continues on statin therapy.  We are requesting most recent lab work from Dr. Woody Seller.  4.  Diastolic dysfunction, LVEF greater than 65% with mild LVH by last echocardiogram.  She continues on ARB.  Time:   Today, I have spent 6 minutes with the patient with telehealth technology discussing the above problems.     Medication Adjustments/Labs and Tests Ordered: Current medicines are reviewed at length with the patient today.  Concerns regarding medicines are outlined above.   Tests Ordered: Orders Placed This Encounter  Procedures  . ECHOCARDIOGRAM COMPLETE    Medication Changes: No orders of the defined types were placed in this encounter.   Follow Up:  In Person 6 months in the [place] office.  Signed, Rozann Lesches, MD  11/19/2019 10:01 AM    [place]

## 2020-01-30 ENCOUNTER — Other Ambulatory Visit: Payer: Self-pay | Admitting: Emergency Medicine

## 2020-02-14 ENCOUNTER — Ambulatory Visit: Payer: Medicare Other | Admitting: Emergency Medicine

## 2020-03-03 ENCOUNTER — Other Ambulatory Visit: Payer: Self-pay | Admitting: Cardiology

## 2020-03-03 DIAGNOSIS — I359 Nonrheumatic aortic valve disorder, unspecified: Secondary | ICD-10-CM

## 2020-03-24 ENCOUNTER — Other Ambulatory Visit: Payer: Self-pay

## 2020-03-24 ENCOUNTER — Ambulatory Visit: Payer: Medicare Other | Admitting: Emergency Medicine

## 2020-03-24 ENCOUNTER — Encounter: Payer: Self-pay | Admitting: Internal Medicine

## 2020-03-24 ENCOUNTER — Encounter: Payer: Self-pay | Admitting: Emergency Medicine

## 2020-03-24 DIAGNOSIS — J449 Chronic obstructive pulmonary disease, unspecified: Secondary | ICD-10-CM | POA: Diagnosis not present

## 2020-03-24 DIAGNOSIS — K2100 Gastro-esophageal reflux disease with esophagitis, without bleeding: Secondary | ICD-10-CM

## 2020-03-24 NOTE — Patient Instructions (Addendum)
Please increase your Nexium to 40mg  twice a day. Take 1 hour around food.  Continue Trelegy once a day. Rinse and gargle after using.  Keep albuterol available to use 2 puffs or 1 nebulizer treatment up to every 4 hours if needed for shortness of breath, chest tightness, wheezing.  Follow up with gastroenterology as planned.  Follow with Dr. in 2 months or sooner if you have any problems.

## 2020-03-24 NOTE — Progress Notes (Signed)
b Subjective:    Patient ID: April Hull, female    DOB: [birth year], 79 y.o.   MRN: [id number]  HPI   ROV 08/06/2019 --79 year old woman with a history of aortic stenosis, TAVR, COPD with chronic cough in the setting of chronic rhinitis.  At her last visit we suspected that she also has breakthrough GERD even on Protonix so we added Pepcid to this.  We also tried changing her from Spiriva to Trelegy to see if she get more benefit.  She reports today that her breathing is better, she is able to do some of her housework but not all. She has been walking around her neighborhood. She continues to have significant acid reflux >> is now trying to modify her diet to help w this. Remains on protonix + pepcid. Her cough is better with the Trelegy. Uses albuterol vary rarely now. Needs flu shot and PNA vaccine   Hosp F/u visit 03/24/20 --79 year old woman with a history of aortic stenosis status post TAVR, COPD with severe obstruction (from 03/09/2017), chronic cough in the setting of chronic rhinitis and GERD.  We had been managing her on Trelegy but she developed some visual changes, has glaucoma.  For this reason was changed back to Spiriva, but then back to Trelegy when she had more SOB. She is having a lot of trouble with increased GERD - makes her COPD more labile, worsens her dyspnea. Her exertional tolerance has decreased. She has been on pepcid, protonix, now on nexium qd. She has reflux and emesis through the day. She is waiting to see gastroenterology in [place].  She has a hx esophageal dilation in the past. She has daily cough, minimally productive. She hears some stridor.   MDM:  Reviewed cardiology notes 11/19/19 Reviewed Pulm notes 08/27/19 Reviewed PFT 03/09/17 Reviewed CXR 05/22/19   Review of Systems  Constitutional: Positive for unexpected weight change. Negative for fever.  HENT: Negative for congestion, dental problem, ear pain, nosebleeds, rhinorrhea, sinus pressure, sneezing, sore  throat and trouble swallowing.   Eyes: Negative for redness and itching.  Respiratory: Positive for shortness of breath. Negative for cough (Increased sputum production), chest tightness and wheezing.   Cardiovascular: Positive for leg swelling. Negative for palpitations.  Gastrointestinal: Negative for nausea and vomiting.  Genitourinary: Negative for dysuria.  Musculoskeletal: Negative for joint swelling.  Skin: Negative for rash.  Neurological: Negative for headaches.  Hematological: Does not bruise/bleed easily.  Psychiatric/Behavioral: Negative for dysphoric mood. The patient is not nervous/anxious.        Objective:   Physical Exam Vitals:   03/24/20 1021  BP: 120/62  Pulse: 86  Temp: 98.5 F (36.9 C)  TempSrc: Oral  SpO2: 95%  Weight: 145 lb (65.8 kg)  Height: 5\' 3"  (1.6 m)   Gen: Pleasant, well-nourished, in no distress,  normal affect  ENT: No lesions,  mouth clear,  oropharynx clear, no postnasal drip  Neck: No JVD, soft intermittent stridor  Lungs: No use of accessory muscles, no crackles or wheezing on normal respiration, no wheeze on forced expiration  Cardiovascular: RRR, heart sounds normal, no murmur or gallops, no peripheral edema  Musculoskeletal: No deformities, no cyanosis or clubbing  Neuro: alert, awake, non focal  Skin: Warm, no lesions or rash      Assessment & Plan:  COPD (chronic obstructive pulmonary disease) Much more active symptoms, labile status but sounds most consistent with upper airway irritation, stridor and in part active COPD due to her poorly controlled reflux.  She  has emesis, GERD all day and also at night.  She has a history of esophageal dilation in the past, question whether this needs to happen again.  She is on maximal bronchodilator therapy and notes that Laurence Ferrari does not always reverse symptoms.  She describes intermittent daily stridor.  I think controlling her GERD aggressively is key.  She needs to see gastroenterology.  I  will continue her on Trelegy for now, albuterol as needed.  Consider a walking oximetry next time  GERD (gastroesophageal reflux disease) Completely uncontrolled currently.  She has a history of esophageal dilation in the past, question whether this is relevant.  She needs to see gastroenterology.  In the short-term I will increase her Nexium to twice daily.  She is being referred to see Dr. Sydell Axon with gastroenterology    Baltazar Apo, MD, PhD 03/24/2020, 10:40 AM [facility] [phone] or if no answer [phone]

## 2020-03-24 NOTE — Assessment & Plan Note (Signed)
Much more active symptoms, labile status but sounds most consistent with upper airway irritation, stridor and in part active COPD due to her poorly controlled reflux.  She has emesis, GERD all day and also at night.  She has a history of esophageal dilation in the past, question whether this needs to happen again.  She is on maximal bronchodilator therapy and notes that Sharolyn Douglas does not always reverse symptoms.  She describes intermittent daily stridor.  I think controlling her GERD aggressively is key.  She needs to see gastroenterology.  I will continue her on Trelegy for now, albuterol as needed.  Consider a walking oximetry next time

## 2020-03-24 NOTE — Assessment & Plan Note (Signed)
Completely uncontrolled currently.  She has a history of esophageal dilation in the past, question whether this is relevant.  She needs to see gastroenterology.  In the short-term I will increase her Nexium to twice daily.  She is being referred to see Dr. Kendell Bane with gastroenterology

## 2020-03-27 ENCOUNTER — Other Ambulatory Visit: Payer: Self-pay | Admitting: Emergency Medicine

## 2020-04-17 ENCOUNTER — Other Ambulatory Visit: Payer: Self-pay

## 2020-04-17 MED ORDER — VALSARTAN 160 MG PO TABS: 160.0000 mg | ORAL_TABLET | Freq: Every day | ORAL | 3 refills | Status: DC

## 2020-04-24 ENCOUNTER — Other Ambulatory Visit: Payer: Self-pay | Admitting: *Deleted

## 2020-04-29 ENCOUNTER — Encounter: Payer: Self-pay | Admitting: *Deleted

## 2020-04-29 ENCOUNTER — Telehealth: Payer: Self-pay | Admitting: *Deleted

## 2020-04-29 ENCOUNTER — Ambulatory Visit: Payer: Medicare Other | Admitting: Gastroenterology

## 2020-04-29 ENCOUNTER — Encounter: Payer: Self-pay | Admitting: Gastroenterology

## 2020-04-29 ENCOUNTER — Other Ambulatory Visit: Payer: Self-pay

## 2020-04-29 VITALS — BP 157/80 | HR 78 | Temp 97.3°F | Ht 63.0 in | Wt 144.0 lb

## 2020-04-29 DIAGNOSIS — R112 Nausea with vomiting, unspecified: Secondary | ICD-10-CM

## 2020-04-29 DIAGNOSIS — K219 Gastro-esophageal reflux disease without esophagitis: Secondary | ICD-10-CM | POA: Diagnosis not present

## 2020-04-29 DIAGNOSIS — R1310 Dysphagia, unspecified: Secondary | ICD-10-CM

## 2020-04-29 DIAGNOSIS — R1032 Left lower quadrant pain: Secondary | ICD-10-CM | POA: Insufficient documentation

## 2020-04-29 DIAGNOSIS — R1013 Epigastric pain: Secondary | ICD-10-CM | POA: Diagnosis not present

## 2020-04-29 DIAGNOSIS — R1319 Other dysphagia: Secondary | ICD-10-CM

## 2020-04-29 NOTE — Progress Notes (Signed)
Primary Care Physician:  Glenda Chroman, MD  Primary Gastroenterologist:  Garfield Cornea, MD   Chief Complaint  Patient presents with  . Gastroesophageal Reflux    had part of colon removed 2016 d/t diverticulitis; colostomy was reversed  . Bloated    HPI:  April Hull is a 79 y.o. female here at the request of Dr. Woody Seller for further evaluation of reflux.  Patient presents with 1 year history of vomiting occurring frequently but not every day. Typically does well with breakfast meal.  Eats around 10 AM.  Consumes omelette, muffin, coffee.  When she tries to eat again in the afternoons, typically develops vomiting.  Yesterday had cooked squash and onions, green beans and had vomiting.  Typically vomits grapes, salads.  Avoiding seeds, nuts, any greasy foods.  At this point only consuming 1 meal a day.  Has been maintaining her weight however.  She has been on pantoprazole 40 mg daily since colon surgery in 2016.  Has been on Nexium 20 mg twice daily in addition to that over the past 2 to 3 months without any benefit.  Has some heartburn but not bad.  Does have some nocturnal regurgitation at times.  Typically does not have abdominal pain although Sunday night she had significant and atraumatic night.  Occurred in the epigastrium.  Lasted for hours.  Chronically she has a bowel movement 1 hour after each meal.  Stools are loose.  This has been happening since her colostomy reversal in 2016.  Overall feels like things are fairly stable in that department.  No nocturnal bowel movements.  No melena or rectal bleeding. She has been having some dysphagia to solid foods, not any particular ones. Happens infrequent.   She had a EGD and colonoscopy in 2006 with Dr. Laural Golden.  Esophagus appeared normal but dilated due to history of dysphagia.  She had sigmoid colon diverticulosis, small polyp ablated via cold biopsy from the transverse colon.  In 2016 she had an open sigmoid colectomy with colostomy, drainage of  pelvic abscess, small bowel resection of the distal ileum for sigmoid diverticulitis large bowel obstruction and pelvic abscess.  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   Current Outpatient Medications  Medication Sig Dispense Refill  . albuterol (PROVENTIL) (2.5 MG/3ML) 0.083% nebulizer solution Take 3 mLs (2.5 mg total) by nebulization every 6 (six) hours as needed for wheezing or shortness of breath. 75 mL 3  . aspirin EC 81 MG tablet Take 1 tablet (81 mg total) by mouth daily. 90 tablet 3  . atorvastatin (LIPITOR) 10 MG tablet Take 10 mg by mouth at bedtime.     . Calcium Carbonate-Vitamin D (CALCIUM 500 + D PO) Take 1 tablet by mouth every other day.    . Esomeprazole Magnesium (NEXIUM PO) Take by mouth 2 (two) times daily. OTC    . fluticasone (FLONASE) 50 MCG/ACT nasal spray Use 2 spray(s) in each nostril once daily 48 g 1  . levothyroxine (SYNTHROID, LEVOTHROID) 88 MCG tablet Take 88 mcg by mouth daily before breakfast.    . loratadine (CLARITIN) 10 MG tablet TAKE 1 TABLET BY MOUTH ONCE DAILY 90 tablet 1  . Multiple Vitamin (MULTIVITAMIN WITH MINERALS) TABS tablet Take 1 tablet by mouth every other day.    . pantoprazole (PROTONIX) 40 MG tablet Take 40 mg by mouth daily.    . TRELEGY ELLIPTA 100-62.5-25 MCG/INH AEPB Inhale 1  puff into the lungs daily.    . valsartan (DIOVAN) 160 MG tablet Take 1 tablet (160 mg total) by mouth daily. 90 tablet 3  . VENTOLIN HFA 108 (90 Base) MCG/ACT inhaler INHALE 2 PUFFS BY MOUTH EVERY 4 HOURS AS NEEDED FOR WHEEZING AND FOR SHORTNESS OF BREATH 18 g 5   No current facility-administered medications for this visit.    Allergies as of 04/29/2020 - Review Complete 04/29/2020  Allergen Reaction Noted  . Bactrim [sulfamethoxazole-trimethoprim] Hives, Itching, and Other (See Comments) 01/29/2013  . Plavix [clopidogrel bisulfate] Hives 04/14/2017  . Codeine  Palpitations 02/02/2013    Past Medical History:  Diagnosis Date  . Anxiety   . Aortic stenosis   . Colon polyp   . COPD (chronic obstructive pulmonary disease) ([place])   . Diarrhea   . Essential hypertension   . GERD (gastroesophageal reflux disease)   . History of blood transfusion   . History of pneumonia   . Hypothyroidism   . Mixed hyperlipidemia   . OA (osteoarthritis of the spine)   . Osteoporosis   . 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 Sigmoid diverticulitis     Past Surgical History:  Procedure Laterality Date  . ABDOMINAL HYSTERECTOMY  1972  . APPENDECTOMY    . COLONOSCOPY  10/15/2005   Rehman: Sigmoid colon diverticulosis, small polyp ablated via cold biopsy in the transverse colon  . COLOSTOMY N/A 11/27/2014   Procedure: COLOSTOMY;  Surgeon: Erroll Luna, MD;  Location: [facility];  Service: General;  Laterality: N/A;  . COLOSTOMY TAKEDOWN N/A 04/09/2015   Procedure: LAPAROSCOPIC ASSISTED COLOSTOMY CLOSURE; RIGID SIGMOIDOSCOPY;  Surgeon: Erroll Luna, MD;  Location: [facility];  Service: General;  Laterality: N/A;  . ESOPHAGOGASTRODUODENOSCOPY  10/15/2005   Rehman: Normal exam, status post esophageal dilation for history of dysphagia.  . hammer toes Bilateral    6  . LAPAROSCOPIC SIGMOID COLECTOMY N/A 11/27/2014   Procedure: LAPAROSCOPIC ASSISTED SIGMOID COLECTOMY;  Surgeon: Erroll Luna, MD;  Location: [facility];  Service: General;  Laterality: N/A;  . LUMBAR DISC SURGERY     x 2  . RIGHT/LEFT HEART CATH AND CORONARY ANGIOGRAPHY N/A 03/04/2017   Procedure: Right/Left Heart Cath and Coronary Angiography;  Surgeon: Burnell Blanks, MD;  Location: [facility];  Service: Cardiovascular;  Laterality: N/A;  . ROTATOR CUFF REPAIR Left 2009  . TEE WITHOUT CARDIOVERSION N/A 04/05/2017   Procedure: TRANSESOPHAGEAL ECHOCARDIOGRAM (TEE);   Surgeon: Burnell Blanks, MD;  Location: [facility];  Service: Open Heart Surgery;  Laterality: N/A;  . TRANSCATHETER AORTIC VALVE REPLACEMENT, TRANSFEMORAL N/A 04/05/2017   Procedure: TRANSCATHETER AORTIC VALVE REPLACEMENT, TRANSFEMORAL;  Surgeon: Burnell Blanks, MD;  Location: [facility];  Service: Open Heart Surgery;  Laterality: N/A;    Family History  Problem Relation Age of Onset  . Congestive Heart Failure Mother   . COPD Father   . Emphysema Father   . CAD Brother   . Breast cancer Sister   . Heart disease Sister   . Uterine cancer Daughter     Social History   Socioeconomic History  . Marital status: Widowed    Spouse name: Not on file  . Number of children: Not on file  . Years of education: Not on file  . Highest education level: Not on file  Occupational History  . 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  Smoking status: Former Smoker    Packs/day: 0.25    Years: 40.00    Pack years: 10.00    Types: Cigarettes    Start date: 08/04/1956    Quit date: 03/24/2017    Years since quitting: 3.1  . Smokeless tobacco: Never Used  . Tobacco comment: 2 -3  Vaping Use  . Vaping Use: Never used  Substance and Sexual Activity  . Alcohol use: No    Alcohol/week: 0.0 standard drinks  . Drug use: No  . Sexual activity: Never  Other Topics Concern  . Not on file  Social History Narrative  . Not on file   Social Determinants of Health   Financial Resource Strain:   . Difficulty of Paying Living Expenses:   Food Insecurity:   . Worried About Charity fundraiser in the Last Year:   . Arboriculturist in the Last Year:   Transportation Needs:   . Film/video editor (Medical):   Marland Kitchen Lack of Transportation (Non-Medical):   Physical Activity:   . Days of Exercise per Week:   . Minutes of Exercise per Session:   Stress:   . Feeling of Stress :   Social Connections:   . Frequency of Communication with Friends and Family:   . Frequency of  Social Gatherings with Friends and Family:   . Attends Religious Services:   . Active Member of Clubs or Organizations:   . Attends Archivist Meetings:   Marland Kitchen Marital Status:   Intimate Partner Violence:   . Fear of Current or Ex-Partner:   . Emotionally Abused:   Marland Kitchen Physically Abused:   . Sexually Abused:       ROS:  General: Negative for anorexia, weight loss, fever, chills, fatigue, weakness. Eyes: Negative for vision changes.  ENT: Negative for hoarseness,  nasal congestion. CV: Negative for chest pain, angina, palpitations, +dyspnea on exertion,+ peripheral edema.  Respiratory: Negative for dyspnea at rest,  cough, sputum, wheezing. +DOE GI: See history of present illness. GU:  Negative for dysuria, hematuria, urinary incontinence, urinary frequency, nocturnal urination.  MS: Negative for joint pain, low back pain.  Derm: Negative for rash or itching.  Neuro: Negative for weakness, abnormal sensation, seizure, frequent headaches, memory loss, confusion.  Psych: Negative for anxiety, depression, suicidal ideation, hallucinations.  Endo: Negative for unusual weight change.  Heme: Negative for bruising or bleeding. Allergy: Negative for rash or hives.    Physical Examination:  BP (!) 157/80   Pulse 78   Temp (!) 97.3 F (36.3 C) (Oral)   Ht '5\' 3"'  (1.6 m)   Wt 144 lb (65.3 kg)   BMI 25.51 kg/m    General: Well-nourished, well-developed in no acute distress.  Head: Normocephalic, atraumatic.   Eyes: Conjunctiva pink, no icterus. Mouth: masked Neck: Supple without thyromegaly, masses, or lymphadenopathy.  Lungs: Clear to auscultation bilaterally.  Heart: Regular rate and rhythm, no murmurs rubs or gallops.  Abdomen: Bowel sounds are normal, nondistended, no hepatosplenomegaly or masses, no abdominal bruits or    hernia , no rebound or guarding.  Well healed incisions. Moderate epigastric and LLQ pain. Diffuse mild pain.  Rectal: not performed Extremities:  trace lower extremity edema. No clubbing or deformities.  Neuro: Alert and oriented x 4 , grossly normal neurologically.  Skin: Warm and dry, no rash or jaundice.   Psych: Alert and cooperative, normal mood and affect.   Labs from June 2020 reviewed: CBC, c-Met, TSH, vitamin B12 normal.  Impression/plan:  79 year old female presenting at the request of Dr. Woody Seller for further evaluation of acid reflux.  Complains of 1 year history of vomiting on a frequent basis, generally occurring in the afternoons.  Typical heartburn reasonably well controlled.  Does have some nocturnal regurgitation at times.  Last weekend developed acute onset epigastric pain lasting for several hours, new symptom for her.  She has a complicated GI history as outlined above.  Vomiting seems to be out of proportion for her other reflux symptoms and she was taking pantoprazole 40 mg daily along with Nexium 20 mg twice daily with no improvement.  Would recommend CT abdomen pelvis to further evaluate abdominal pain and vomiting. If CT unremarkable the next step would be upper endoscopy.  We will have her stop Nexium, increase pantoprazole to 40 mg twice daily before meals.  Recheck labs.  ASAIII

## 2020-04-29 NOTE — Patient Instructions (Signed)
1. Stop Nexium. Increase pantoprazole to 40mg  30 minutes before breakfast and your evening meal.  2. Please go for labs today. We will contact you with results as available. 3. CT scan of your abdomen to evaluate your vomiting and abdominal pain. If negative, then next step will be upper endoscopy. 4. Call with any worsening symptoms.

## 2020-04-29 NOTE — Telephone Encounter (Signed)
Per [employer]: Based on the date and type of service entered, prior authorization by [employer] is not required. Please contact the health plan if additional information is needed.

## 2020-04-30 LAB — CBC WITH DIFFERENTIAL/PLATELET
Absolute Monocytes: 601 cells/uL (ref 200–950)
Basophils Absolute: 47 cells/uL (ref 0–200)
Basophils Relative: 0.6 %
Eosinophils Absolute: 265 cells/uL (ref 15–500)
Eosinophils Relative: 3.4 %
HCT: 40.3 % (ref 35.0–45.0)
Hemoglobin: 13.5 g/dL (ref 11.7–15.5)
Lymphs Abs: 2262 cells/uL (ref 850–3900)
MCH: 31.3 pg (ref 27.0–33.0)
MCHC: 33.5 g/dL (ref 32.0–36.0)
MCV: 93.5 fL (ref 80.0–100.0)
MPV: 9.4 fL (ref 7.5–12.5)
Monocytes Relative: 7.7 %
Neutro Abs: 4625 cells/uL (ref 1500–7800)
Neutrophils Relative %: 59.3 %
Platelets: 233 10*3/uL (ref 140–400)
RBC: 4.31 10*6/uL (ref 3.80–5.10)
RDW: 12.5 % (ref 11.0–15.0)
Total Lymphocyte: 29 %
WBC: 7.8 10*3/uL (ref 3.8–10.8)

## 2020-04-30 LAB — COMPREHENSIVE METABOLIC PANEL
AG Ratio: 2 (calc) (ref 1.0–2.5)
ALT: 16 U/L (ref 6–29)
AST: 23 U/L (ref 10–35)
Albumin: 4.3 g/dL (ref 3.6–5.1)
Alkaline phosphatase (APISO): 76 U/L (ref 37–153)
BUN: 8 mg/dL (ref 7–25)
CO2: 27 mmol/L (ref 20–32)
Calcium: 9.6 mg/dL (ref 8.6–10.4)
Chloride: 101 mmol/L (ref 98–110)
Creat: 0.66 mg/dL (ref 0.60–0.88)
Globulin: 2.2 g/dL (calc) (ref 1.9–3.7)
Glucose, Bld: 87 mg/dL (ref 65–99)
Potassium: 3.9 mmol/L (ref 3.5–5.3)
Sodium: 139 mmol/L (ref 135–146)
Total Bilirubin: 0.7 mg/dL (ref 0.2–1.2)
Total Protein: 6.5 g/dL (ref 6.1–8.1)

## 2020-04-30 LAB — LIPASE: Lipase: 17 U/L (ref 7–60)

## 2020-04-30 NOTE — Progress Notes (Signed)
CC'ED TO PCP 

## 2020-05-01 ENCOUNTER — Ambulatory Visit (HOSPITAL_COMMUNITY)
Admission: RE | Admit: 2020-05-01 | Discharge: 2020-05-01 | Disposition: A | Discharge status: Home / Self Care | Payer: Medicare Other | Source: Ambulatory Visit | Attending: Gastroenterology | Admitting: Gastroenterology

## 2020-05-01 ENCOUNTER — Other Ambulatory Visit: Payer: Self-pay

## 2020-05-01 DIAGNOSIS — R1032 Left lower quadrant pain: Secondary | ICD-10-CM | POA: Insufficient documentation

## 2020-05-01 DIAGNOSIS — R112 Nausea with vomiting, unspecified: Secondary | ICD-10-CM | POA: Insufficient documentation

## 2020-05-01 DIAGNOSIS — R1013 Epigastric pain: Secondary | ICD-10-CM | POA: Diagnosis not present

## 2020-05-01 MED ORDER — IOHEXOL 300 MG/ML  SOLN
100.0000 mL | Freq: Once | INTRAMUSCULAR | Status: AC | PRN
  Administered 2020-05-01: 100 mL via INTRAVENOUS

## 2020-05-02 ENCOUNTER — Other Ambulatory Visit: Payer: Self-pay | Admitting: Emergency Medicine

## 2020-05-07 ENCOUNTER — Telehealth: Payer: Self-pay | Admitting: Emergency Medicine

## 2020-05-07 MED ORDER — TRELEGY ELLIPTA 100-62.5-25 MCG/INH IN AEPB
1.0000 | INHALATION_SPRAY | Freq: Every day | RESPIRATORY_TRACT | 0 refills | Status: DC
Start: 2020-05-07 — End: 2020-06-12

## 2020-05-07 NOTE — Telephone Encounter (Signed)
Pt returning a phone call. Pt can be reached at [phone]. She needs the information for help on her trelegy prescription.

## 2020-05-07 NOTE — Telephone Encounter (Signed)
lmtcb for pt. Need to figure out why she is requesting this information.

## 2020-05-07 NOTE — Telephone Encounter (Signed)
Patient called and provided Samples of Trelegy 100 sent to patient. She is unable to drive. Her appointments will need to be done in [place] from now on. Recall placed for consult with new MD in .

## 2020-05-08 ENCOUNTER — Telehealth: Payer: Self-pay

## 2020-05-08 NOTE — Telephone Encounter (Signed)
FYI MB, pt called back after talking with her daughter about the procedure needed. Pt is aware that she will be contacted when [facility]MR's schedule is open for September. Dates 9/3, 9/17/, 10/8. Pt is aware that these dates may not be available for her.

## 2020-05-12 NOTE — Telephone Encounter (Signed)
Noted. She is on list to be called when September schedule is available.

## 2020-05-15 ENCOUNTER — Telehealth: Payer: Self-pay | Admitting: Emergency Medicine

## 2020-05-15 NOTE — Telephone Encounter (Signed)
Will await fax.

## 2020-05-19 ENCOUNTER — Ambulatory Visit: Payer: Medicare Other | Admitting: Cardiology

## 2020-05-19 ENCOUNTER — Other Ambulatory Visit: Payer: Self-pay | Admitting: Emergency Medicine

## 2020-05-20 NOTE — Telephone Encounter (Signed)
Has this fax been received 

## 2020-05-20 NOTE — Telephone Encounter (Signed)
Fax recived and placed in Dr. Kavin Leech look at folder.   Routing to Dr. Delton Coombes just as Lorain Childes.

## 2020-05-21 ENCOUNTER — Ambulatory Visit (INDEPENDENT_AMBULATORY_CARE_PROVIDER_SITE_OTHER): Payer: Medicare Other

## 2020-05-21 DIAGNOSIS — I359 Nonrheumatic aortic valve disorder, unspecified: Secondary | ICD-10-CM | POA: Diagnosis not present

## 2020-05-21 LAB — ECHOCARDIOGRAM COMPLETE
AV Mean grad: 6.7 mmHg
AV Peak grad: 12.4 mmHg
Ao pk vel: 1.76 m/s
Area-P 1/2: 2.28 cm2
Calc EF: 57.2 %
MV M vel: 4.66 m/s
MV Peak grad: 86.9 mmHg
S' Lateral: 1.94 cm
Single Plane A2C EF: 54.9 %
Single Plane A4C EF: 62.9 %

## 2020-05-22 ENCOUNTER — Ambulatory Visit (INDEPENDENT_AMBULATORY_CARE_PROVIDER_SITE_OTHER): Payer: Medicare Other | Admitting: Cardiology

## 2020-05-22 ENCOUNTER — Encounter: Payer: Self-pay | Admitting: Cardiology

## 2020-05-22 VITALS — BP 110/68 | HR 74 | Ht 63.0 in | Wt 143.2 lb

## 2020-05-22 DIAGNOSIS — Z952 Presence of prosthetic heart valve: Secondary | ICD-10-CM | POA: Diagnosis not present

## 2020-05-22 DIAGNOSIS — I10 Essential (primary) hypertension: Secondary | ICD-10-CM

## 2020-05-22 DIAGNOSIS — E782 Mixed hyperlipidemia: Secondary | ICD-10-CM | POA: Diagnosis not present

## 2020-05-22 NOTE — Progress Notes (Signed)
Cardiology Office Note  Date: 05/22/2020   ID: April Hull, April Hull [birth year], MRN [id number]  PCP:  [name], MD  Cardiologist:  [name], MD Electrophysiologist:  None   Chief Complaint  Patient presents with   Cardiac follow-up    History of Present Illness: April Hull is an 79 y.o. female last assessed via telehealth encounter in February.  She presents for a routine visit.  Still reports dyspnea on exertion in the setting of COPD.  She is in the process of transitioning to the [facility] office in [place], previously seeing Dr. Delton Coombes.  She does not describe any palpitations or exertional chest pain.  I personally reviewed her ECG today which shows sinus rhythm with decreased R wave progression and low voltage.  Follow-up echocardiogram done recently showed LVEF 60 to 65% with mild diastolic dysfunction and normally functioning TAVR prosthesis with mean gradient 7 mmHg.  We discussed these results today.  I reviewed her medications which are outlined below.  She continues to follow with Dr. Sherril Croon for primary care.  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   26 mm Edwards Sapien 3 transcatheter heart valve placed via percutaneous left transfemoral approach   Sigmoid diverticulitis     Past Surgical History:  Procedure Laterality Date   ABDOMINAL HYSTERECTOMY  1972   APPENDECTOMY     COLONOSCOPY  10/15/2005   Rehman: Sigmoid colon diverticulosis, small polyp ablated via cold biopsy in the transverse colon   COLOSTOMY N/A 11/27/2014    Procedure: COLOSTOMY;  Surgeon: Harriette Bouillon, MD;  Location: [facility] OR;  Service: General;  Laterality: N/A;   COLOSTOMY TAKEDOWN N/A 04/09/2015   Procedure: LAPAROSCOPIC ASSISTED COLOSTOMY CLOSURE; RIGID SIGMOIDOSCOPY;  Surgeon: Harriette Bouillon, MD;  Location: [facility];  Service: General;  Laterality: N/A;   ESOPHAGOGASTRODUODENOSCOPY  10/15/2005   Rehman: Normal exam, status post esophageal dilation for history of dysphagia.   hammer toes Bilateral    6   LAPAROSCOPIC SIGMOID COLECTOMY N/A 11/27/2014   Procedure: LAPAROSCOPIC ASSISTED SIGMOID COLECTOMY;  Surgeon: Harriette Bouillon, MD;  Location: [facility];  Service: General;  Laterality: N/A;   LUMBAR DISC SURGERY     x 2   RIGHT/LEFT HEART CATH AND CORONARY ANGIOGRAPHY N/A 03/04/2017   Procedure: Right/Left Heart Cath and Coronary Angiography;  Surgeon: Kathleene Hazel, MD;  [facility]: [facility];  Service: Cardiovascular;  Laterality: N/A;   ROTATOR CUFF REPAIR Left 2009   TEE WITHOUT CARDIOVERSION N/A 04/05/2017   Procedure: TRANSESOPHAGEAL ECHOCARDIOGRAM (TEE);  Surgeon: Kathleene Hazel, MD;  Location: [facility];  Service: Open Heart Surgery;  Laterality: N/A;   TRANSCATHETER AORTIC VALVE REPLACEMENT, TRANSFEMORAL N/A 04/05/2017   Procedure: TRANSCATHETER AORTIC VALVE REPLACEMENT, TRANSFEMORAL;  Surgeon: Kathleene Hazel, MD;  Location[facility];  Service: Open Heart Surgery;  Laterality: N/A;    Current Outpatient Medications  Medication Sig Dispense Refill   albuterol (PROVENTIL) (2.5 MG/3ML) 0.083% nebulizer solution Take 3 mLs (2.5 mg total) by nebulization every 6 (six)  hours as needed for wheezing or shortness of breath. 75 mL 3   aspirin EC 81 MG tablet Take 1 tablet (81 mg total) by mouth daily. 90 tablet 3   atorvastatin (LIPITOR) 10 MG tablet Take 10 mg by mouth at bedtime.      Calcium Carbonate-Vitamin D (CALCIUM 500 + D PO) Take 1 tablet by mouth every other day.     EQ LORATADINE 10 MG tablet Take 1  tablet by mouth once daily 90 tablet 3   fluticasone (FLONASE) 50 MCG/ACT nasal spray Use 2 spray(s) in each nostril once daily 48 g 0   Fluticasone-Umeclidin-Vilant (TRELEGY ELLIPTA) 100-62.5-25 MCG/INH AEPB Inhale 1 puff into the lungs daily. 60 each 0   levothyroxine (SYNTHROID, LEVOTHROID) 88 MCG tablet Take 88 mcg by mouth daily before breakfast.     Multiple Vitamin (MULTIVITAMIN WITH MINERALS) TABS tablet Take 1 tablet by mouth every other day.     pantoprazole (PROTONIX) 40 MG tablet Take 40 mg by mouth 2 (two) times daily before a meal.     TRELEGY ELLIPTA 100-62.5-25 MCG/INH AEPB Inhale 1 puff into the lungs daily.     valsartan (DIOVAN) 160 MG tablet Take 1 tablet (160 mg total) by mouth daily. 90 tablet 3   VENTOLIN HFA 108 (90 Base) MCG/ACT inhaler INHALE 2 PUFFS BY MOUTH EVERY 4 HOURS AS NEEDED FOR WHEEZING AND FOR SHORTNESS OF BREATH 18 g 5   No current facility-administered medications for this visit.   Allergies:  Bactrim [sulfamethoxazole-trimethoprim], Plavix [clopidogrel bisulfate], and Codeine   ROS:   No palpitations or syncope.  Physical Exam: VS:  BP 110/68    Pulse 74    Ht 5\' 3"  (1.6 m)    Wt 143 lb 3.2 oz (65 kg)    SpO2 96%    BMI 25.37 kg/m , BMI Body mass index is 25.37 kg/m.  Wt Readings from Last 3 Encounters:  05/22/20 143 lb 3.2 oz (65 kg)  04/29/20 144 lb (65.3 kg)  03/24/20 145 lb (65.8 kg)    General: Elderly woman, appears comfortable at rest. HEENT: Conjunctiva and lids normal, wearing a mask. Neck: Supple, no elevated JVP or carotid bruits, no thyromegaly. Lungs: Decreased breath sounds, no wheezing, nonlabored breathing at rest. Cardiac: Regular rate and rhythm, no S3, 1-2/6 systolic murmur, no pericardial rub. Extremities: No pitting edema, distal pulses 2+.  ECG:  An ECG dated 05/22/2019 was personally reviewed today and demonstrated:  Sinus rhythm with low voltage.  Recent Labwork: 04/29/2020: ALT 16; AST 23; BUN 8; Creat 0.66;  Hemoglobin 13.5; Platelets 233; Potassium 3.9; Sodium 139  June 2020: Cholesterol 188, triglycerides 192, HDL 81, and LDL 69  Other Studies Reviewed Today:  Echocardiogram 05/21/2020: 1. Left ventricular ejection fraction, by estimation, is 60 to 65%. The  left ventricle has normal function. The left ventricle has no regional  wall motion abnormalities. There is mild left ventricular hypertrophy.  Left ventricular diastolic parameters  are consistent with Grade I diastolic dysfunction (impaired relaxation).  Elevated left atrial pressure.  2. Right ventricular systolic function is normal. The right ventricular  size is normal. There is normal pulmonary artery systolic pressure.  3. Left atrial size was moderately dilated.  4. The mitral valve is normal in structure. Mild mitral valve  regurgitation. No evidence of mitral stenosis.  5. The aortic valve has an indeterminant number of cusps. Aortic valve  regurgitation is not visualized. No aortic stenosis is present.There is a  26 mm 07/21/2020  Sapien prosthetic, stented (TAVR) valve present in the  aortic position.   Assessment and Plan:  1.  Aortic stenosis status post TAVR.  No active symptomatology at this time.  She had an echocardiogram done recently showing normal LVEF at 60 to 65% and normally functioning TAVR prosthesis and mean gradient.  We will continue observation with annual imaging.  2.  COPD, followed by [facility] and transitioning to the [place] office.  3.  Mixed hyperlipidemia, she remains on Lipitor and continues to follow with Dr. Sherril Croon.  LDL was 69 last year.  Medication Adjustments/Labs and Tests Ordered: Current medicines are reviewed at length with the patient today.  Concerns regarding medicines are outlined above.   Tests Ordered: Orders Placed This Encounter  Procedures   EKG 12-Lead   ECHOCARDIOGRAM COMPLETE    Medication Changes: No orders of the defined types were placed in this  encounter.   Disposition:  Follow up 1 year in the [place] office.  Signed, Jonelle Sidle, MD, [facility] 05/22/2020 1:28 PM    [facility] [address] [place], [place], [place] [address] Phone: [phone]; Fax: [phone]

## 2020-05-22 NOTE — Patient Instructions (Signed)
Medication Instructions:   Your physician recommends that you continue on your current medications as directed. Please refer to the Current Medication list given to you today.  Labwork:  NONE  Testing/Procedures: Your physician has requested that you have an echocardiogram in 1 year just before your next visit. Echocardiography is a painless test that uses sound waves to create images of your heart. It provides your doctor with information about the size and shape of your heart and how well your heart's chambers and valves are working. This procedure takes approximately one hour. There are no restrictions for this procedure.  Follow-Up:  Your physician recommends that you schedule a follow-up appointment in: 1 year. You will receive a reminder letter in the mail in about 10 months reminding you to call and schedule your appointment. If you don't receive this letter, please contact our office.  Any Other Special Instructions Will Be Listed Below (If Applicable).  If you need a refill on your cardiac medications before your next appointment, please call your pharmacy. 

## 2020-05-26 ENCOUNTER — Telehealth: Payer: Self-pay | Admitting: *Deleted

## 2020-05-26 NOTE — Telephone Encounter (Signed)
LMOVM to schedule EGD with Dr. Jena Gauss, conscious sedation

## 2020-05-26 NOTE — Telephone Encounter (Signed)
Patient returned call. She is scheduled for EGD 9/1 at 1:00pm. Aware will need covid test prior and is scheduled for 8/31 at 9:00am. Patient aware will mail instructions with appt. Confirmed mailing address.

## 2020-05-27 MED ORDER — TRELEGY ELLIPTA 100-62.5-25 MCG/INH IN AEPB: 1.0000 | INHALATION_SPRAY | Freq: Every day | RESPIRATORY_TRACT | 5 refills | Status: DC

## 2020-05-27 NOTE — Telephone Encounter (Signed)
Called and spoke with pt letting her know the info stated by RB after reviewing benefits. Pt verbalized understanding. Rx for 73-month supply has been sent to preferred pharmacy. Nothing further needed.

## 2020-05-27 NOTE — Addendum Note (Signed)
Addended by: Wyvonne Lenz on: 05/27/2020 01:40 PM   Modules accepted: Orders

## 2020-05-27 NOTE — Telephone Encounter (Signed)
Please let her know that trelegy is covered at $37/ month through pharmacy, $74/ 3 months per mail-in benefit. This appears to be good through the end of 2021.

## 2020-06-17 ENCOUNTER — Other Ambulatory Visit: Payer: Self-pay

## 2020-06-17 ENCOUNTER — Other Ambulatory Visit (HOSPITAL_COMMUNITY)
Admission: RE | Admit: 2020-06-17 | Discharge: 2020-06-17 | Disposition: A | Discharge status: Home / Self Care | Payer: Medicare Other | Source: Ambulatory Visit | Attending: Internal Medicine | Admitting: Internal Medicine

## 2020-06-17 DIAGNOSIS — Z01812 Encounter for preprocedural laboratory examination: Secondary | ICD-10-CM | POA: Diagnosis present

## 2020-06-17 DIAGNOSIS — Z20822 Contact with and (suspected) exposure to covid-19: Secondary | ICD-10-CM | POA: Insufficient documentation

## 2020-06-17 LAB — SARS CORONAVIRUS 2 (TAT 6-24 HRS): SARS Coronavirus 2: NEGATIVE

## 2020-06-18 ENCOUNTER — Encounter (HOSPITAL_COMMUNITY)
Admission: RE | Disposition: A | Discharge status: Home / Self Care | Payer: Self-pay | Source: Home / Self Care | Attending: Internal Medicine

## 2020-06-18 ENCOUNTER — Encounter (HOSPITAL_COMMUNITY): Payer: Self-pay | Admitting: Internal Medicine

## 2020-06-18 ENCOUNTER — Ambulatory Visit (HOSPITAL_COMMUNITY)
Admission: RE | Admit: 2020-06-18 | Discharge: 2020-06-18 | Disposition: A | Discharge status: Home / Self Care | Payer: Medicare Other | Attending: Internal Medicine | Admitting: Internal Medicine

## 2020-06-18 ENCOUNTER — Other Ambulatory Visit: Payer: Self-pay

## 2020-06-18 DIAGNOSIS — R1310 Dysphagia, unspecified: Secondary | ICD-10-CM | POA: Diagnosis not present

## 2020-06-18 DIAGNOSIS — Z7982 Long term (current) use of aspirin: Secondary | ICD-10-CM | POA: Insufficient documentation

## 2020-06-18 DIAGNOSIS — M810 Age-related osteoporosis without current pathological fracture: Secondary | ICD-10-CM | POA: Diagnosis not present

## 2020-06-18 DIAGNOSIS — E782 Mixed hyperlipidemia: Secondary | ICD-10-CM | POA: Insufficient documentation

## 2020-06-18 DIAGNOSIS — J449 Chronic obstructive pulmonary disease, unspecified: Secondary | ICD-10-CM | POA: Diagnosis not present

## 2020-06-18 DIAGNOSIS — Z954 Presence of other heart-valve replacement: Secondary | ICD-10-CM | POA: Insufficient documentation

## 2020-06-18 DIAGNOSIS — I10 Essential (primary) hypertension: Secondary | ICD-10-CM | POA: Insufficient documentation

## 2020-06-18 DIAGNOSIS — Z7951 Long term (current) use of inhaled steroids: Secondary | ICD-10-CM | POA: Diagnosis not present

## 2020-06-18 DIAGNOSIS — K222 Esophageal obstruction: Secondary | ICD-10-CM

## 2020-06-18 DIAGNOSIS — K449 Diaphragmatic hernia without obstruction or gangrene: Secondary | ICD-10-CM | POA: Insufficient documentation

## 2020-06-18 DIAGNOSIS — I350 Nonrheumatic aortic (valve) stenosis: Secondary | ICD-10-CM | POA: Diagnosis not present

## 2020-06-18 DIAGNOSIS — K8020 Calculus of gallbladder without cholecystitis without obstruction: Secondary | ICD-10-CM | POA: Insufficient documentation

## 2020-06-18 DIAGNOSIS — Z8249 Family history of ischemic heart disease and other diseases of the circulatory system: Secondary | ICD-10-CM | POA: Diagnosis not present

## 2020-06-18 DIAGNOSIS — E039 Hypothyroidism, unspecified: Secondary | ICD-10-CM | POA: Diagnosis not present

## 2020-06-18 DIAGNOSIS — Z79890 Hormone replacement therapy (postmenopausal): Secondary | ICD-10-CM | POA: Diagnosis not present

## 2020-06-18 DIAGNOSIS — R1314 Dysphagia, pharyngoesophageal phase: Secondary | ICD-10-CM | POA: Insufficient documentation

## 2020-06-18 DIAGNOSIS — Z87891 Personal history of nicotine dependence: Secondary | ICD-10-CM | POA: Diagnosis not present

## 2020-06-18 DIAGNOSIS — Z86010 Personal history of colonic polyps: Secondary | ICD-10-CM | POA: Diagnosis not present

## 2020-06-18 DIAGNOSIS — K219 Gastro-esophageal reflux disease without esophagitis: Secondary | ICD-10-CM | POA: Diagnosis not present

## 2020-06-18 DIAGNOSIS — Z79899 Other long term (current) drug therapy: Secondary | ICD-10-CM | POA: Diagnosis not present

## 2020-06-18 DIAGNOSIS — K3189 Other diseases of stomach and duodenum: Secondary | ICD-10-CM | POA: Diagnosis not present

## 2020-06-18 HISTORY — PX: ESOPHAGOGASTRODUODENOSCOPY: SHX5428

## 2020-06-18 HISTORY — PX: ESOPHAGEAL DILATION: SHX303

## 2020-06-18 SURGERY — EGD (ESOPHAGOGASTRODUODENOSCOPY)
Anesthesia: Moderate Sedation

## 2020-06-18 MED ORDER — MIDAZOLAM HCL 5 MG/5ML IJ SOLN
INTRAMUSCULAR | Status: AC
  Filled 2020-06-18: qty 10

## 2020-06-18 MED ORDER — STERILE WATER FOR IRRIGATION IR SOLN
Status: DC | PRN
  Administered 2020-06-18: 1.5 mL

## 2020-06-18 MED ORDER — ONDANSETRON HCL 4 MG/2ML IJ SOLN
INTRAMUSCULAR | Status: DC | PRN
  Administered 2020-06-18: 4 mg via INTRAVENOUS

## 2020-06-18 MED ORDER — LIDOCAINE VISCOUS HCL 2 % MT SOLN
OROMUCOSAL | Status: AC
  Filled 2020-06-18: qty 15

## 2020-06-18 MED ORDER — SODIUM CHLORIDE 0.9 % IV SOLN: INTRAVENOUS | Status: DC

## 2020-06-18 MED ORDER — ONDANSETRON HCL 4 MG/2ML IJ SOLN
INTRAMUSCULAR | Status: AC
  Filled 2020-06-18: qty 2

## 2020-06-18 MED ORDER — MEPERIDINE HCL 50 MG/ML IJ SOLN
INTRAMUSCULAR | Status: AC
  Filled 2020-06-18: qty 1

## 2020-06-18 MED ORDER — LIDOCAINE VISCOUS HCL 2 % MT SOLN
OROMUCOSAL | Status: DC | PRN
  Administered 2020-06-18: 5 mL via OROMUCOSAL

## 2020-06-18 MED ORDER — MEPERIDINE HCL 100 MG/ML IJ SOLN
INTRAMUSCULAR | Status: DC | PRN
  Administered 2020-06-18: 10 mg via INTRAVENOUS
  Administered 2020-06-18: 15 mg
  Administered 2020-06-18: 25 mg

## 2020-06-18 MED ORDER — MIDAZOLAM HCL 5 MG/5ML IJ SOLN
INTRAMUSCULAR | Status: DC | PRN
  Administered 2020-06-18: 1 mg via INTRAVENOUS
  Administered 2020-06-18: 2 mg via INTRAVENOUS
  Administered 2020-06-18 (×2): 1 mg via INTRAVENOUS

## 2020-06-18 NOTE — Op Note (Signed)
[facility] Patient Name: April Hull Procedure Date: 06/18/2020 11:34 AM MRN: [id number] Date of Birth: [birth year] Attending MD: Gennette Pac , MD CSN: [id number] Age: 79 Admit Type: Ambulatory Procedure:                Upper GI endoscopy Indications:              Dysphagia Providers:                Gennette Pac, MD, Criselda Peaches. Patsy Lager, RN,                            Angelica Ran, Nena Polio, RN Referring MD:              Medicines:                Midazolam 5 mg IV, Meperidine 50 mg IV Complications:            No immediate complications. Estimated Blood Loss:     Estimated blood loss was minimal. Procedure:                Pre-Anesthesia Assessment:                           - Prior to the procedure, a History and Physical                            was performed, and patient medications and                            allergies were reviewed. The patient's tolerance of                            previous anesthesia was also reviewed. The risks                            and benefits of the procedure and the sedation                            options and risks were discussed with the patient.                            All questions were answered, and informed consent                            was obtained. Prior Anticoagulants: The patient has                            taken no previous anticoagulant or antiplatelet                            agents. ASA Grade Assessment: III - A patient with                            severe systemic disease. After reviewing the risks  and benefits, the patient was deemed in                            satisfactory condition to undergo the procedure.                           After obtaining informed consent, the endoscope was                            passed under direct vision. Throughout the                            procedure, the patient's blood pressure, pulse, and                             oxygen saturations were monitored continuously. The                            [id number]-[id number] ([id number]) was introduced through the                            mouth, and advanced to the second part of duodenum.                            The upper GI endoscopy was accomplished without                            difficulty. The patient tolerated the procedure                            well. Scope In: 12:49:43 PM Scope Out: 1:00:34 PM Total Procedure Duration: 0 hours 10 minutes 51 seconds  Findings:      A moderate Schatzki ring was found at the gastroesophageal junction.       Esophageal mucosa otherwise appeared normal.      A medium-sized hiatal hernia was present. Scattered erosions and streaky       erythema involving the gastric mucosa diffusely. No ulcer or       infiltrating process seen. Pylorus patent.      The duodenal bulb and second portion of the duodenum were normal. The       scope was withdrawn. Dilation was performed with a Maloney dilator with       mild resistance at 54 Fr. Dilation was performed with a Maloney dilator       with mild resistance at 56 Fr. The dilation site was examined following       endoscope reinsertion and showed mild mucosal disruption. Estimated       blood loss was minimal. Finally the abnormal gastric mucosa was biopsied       for histologic study. Impression:               - Moderate Schatzki ring. Dilated.                           - Medium-sized hiatal hernia. Abnormal gastric  mucosa of uncertain significance. Status post biopsy                           - Normal duodenal bulb and second portion of the                            duodenum.                           -Patient's symptoms somewhat suspicious for biliary                            origin. Calcified stone noted in the neck of the                            gallbladder on prior CT. Moderate Sedation:      Moderate (conscious) sedation was administered by the  endoscopy nurse       and supervised by the endoscopist. The following parameters were       monitored: oxygen saturation, heart rate, blood pressure, respiratory       rate, EKG, adequacy of pulmonary ventilation, and response to care.       Total physician intraservice time was 23 minutes. Recommendation:           - Patient has a contact number available for                            emergencies. The signs and symptoms of potential                            delayed complications were discussed with the                            patient. Return to normal activities tomorrow.                            Written discharge instructions were provided to the                            patient.                           - Advance diet as tolerated. Continue Protonix 40                            mg twice daily. Follow-up on pathology. Proceed                            with a gallbladder ultrasound in the near future.                            Further recommendations to follow. Procedure Code(s):        --- Professional ---                           [id number], Esophagogastroduodenoscopy, flexible,  transoral; diagnostic, including collection of                            specimen(s) by brushing or washing, when performed                            (separate procedure)                           43450, Dilation of esophagus, by unguided sound or                            bougie, single or multiple passes                           99153, Moderate sedation; each additional 15                            minutes intraservice time                           G0500, Moderate sedation services provided by the                            same physician or other qualified health care                            professional performing a gastrointestinal                            endoscopic service that sedation supports,                            requiring the presence of an  independent trained                            observer to assist in the monitoring of the                            patient's level of consciousness and physiological                            status; initial 15 minutes of intra-service time;                            patient age 44 years or older (additional time may                            be reported with 12878, as appropriate) Diagnosis Code(s):        --- Professional ---                           K22.2, Esophageal obstruction                           K44.9,  Diaphragmatic hernia without obstruction or                            gangrene                           R13.10, Dysphagia, unspecified CPT copyright 2019 American Medical Association. All rights reserved. The codes documented in this report are preliminary and upon coder review may  be revised to meet current compliance requirements. Gerrit Friends. Sriman Tally, MD Gennette Pac, MD 06/18/2020 1:16:08 PM This report has been signed electronically. Number of Addenda: 0

## 2020-06-18 NOTE — H&P (Signed)
@LOGO @   Primary Care Physician:  , MD Primary Gastroenterologist:  Dr. [name]  Pre-Procedure History & Physical: HPI:  April Hull is a 79 y.o. female here for further evaluation of postprandial upper abdominal "bloating" and vomiting.  Known gallstones were found in the neck of her gallbladder on CT.  Protonix 40 mg daily and twice daily at times has not made a difference in her symptoms.  Complains of intermittent esophageal dysphagia.  Dilated esophagus seen 2006.  Empiric dilation of her esophagus helped the symptoms considerably for a prolonged period of time.  Past Medical History:  Diagnosis Date  . Anxiety   . Aortic stenosis   . Colon polyp   . COPD (chronic obstructive pulmonary disease) (HCC)   . Diarrhea   . Essential hypertension   . GERD (gastroesophageal reflux disease)   . History of blood transfusion   . History of pneumonia   . Hypothyroidism   . Mixed hyperlipidemia   . OA (osteoarthritis of the spine)   . Osteoporosis   . 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 Sigmoid diverticulitis     Past Surgical History:  Procedure Laterality Date  . ABDOMINAL HYSTERECTOMY  1972  . APPENDECTOMY    . COLONOSCOPY  10/15/2005   Rehman: Sigmoid colon diverticulosis, small polyp ablated via cold biopsy in the transverse colon  . COLOSTOMY N/A 11/27/2014   Procedure: COLOSTOMY;  Surgeon: 01/26/2015, MD;  Location: [facility] OR;  Service: General;  Laterality: N/A;  . COLOSTOMY TAKEDOWN N/A 04/09/2015   Procedure: LAPAROSCOPIC ASSISTED COLOSTOMY CLOSURE; RIGID SIGMOIDOSCOPY;  Surgeon: 04/11/2015, MD;  Location: [facility] OR;  Service: General;  Laterality: N/A;  . ESOPHAGOGASTRODUODENOSCOPY  10/15/2005   Rehman: Normal exam, status post esophageal dilation for history of dysphagia.  . hammer toes Bilateral    6  . LAPAROSCOPIC SIGMOID  COLECTOMY N/A 11/27/2014   Procedure: LAPAROSCOPIC ASSISTED SIGMOID COLECTOMY;  Surgeon: 01/26/2015, MD;  Location: [facility] OR;  Service: General;  Laterality: N/A;  . LUMBAR DISC SURGERY     x 2  . RIGHT/LEFT HEART CATH AND CORONARY ANGIOGRAPHY N/A 03/04/2017   Procedure: Right/Left Heart Cath and Coronary Angiography;  Surgeon: 03/06/2017, MD;  Location: [facility] INVASIVE CV LAB;  Service: Cardiovascular;  Laterality: N/A;  . ROTATOR CUFF REPAIR Left 2009  . TEE WITHOUT CARDIOVERSION N/A 04/05/2017   Procedure: TRANSESOPHAGEAL ECHOCARDIOGRAM (TEE);  Surgeon: 04/07/2017, MD;  Location: [facility] OR;  Service: Open Heart Surgery;  Laterality: N/A;  . TRANSCATHETER AORTIC VALVE REPLACEMENT, TRANSFEMORAL N/A 04/05/2017   Procedure: TRANSCATHETER AORTIC VALVE REPLACEMENT, TRANSFEMORAL;  Surgeon: 04/07/2017, MD;  Location: [facility] OR;  Service: Open Heart Surgery;  Laterality: N/A;    Prior to Admission medications   Medication Sig Start Date End Date Taking? Authorizing Provider  albuterol (PROVENTIL) (2.5 MG/3ML) 0.083% nebulizer solution Take 3 mLs (2.5 mg total) by nebulization every 6 (six) hours as needed for wheezing or shortness of breath. 03/14/19  Yes 03/16/19, NP  aspirin EC 81 MG tablet Take 1 tablet (81 mg total) by mouth daily. 04/05/18  Yes 04/07/18, PA-C  atorvastatin (LIPITOR) 10 MG tablet Take 10 mg by mouth at bedtime.    Yes [provider]  Calcium Carbonate-Vitamin D (CALCIUM 500/D PO) Take 1 tablet by mouth daily.   Yes [provider]  [facility]  LORATADINE 10 MG tablet Take 1 tablet by mouth once daily Patient taking differently: Take 10 mg by mouth daily.  05/07/20  Yes Leslye Peer, MD  fluticasone (FLONASE) 50 MCG/ACT nasal spray Use 2 spray(s) in each nostril once daily Patient taking differently: Place 2 sprays into both nostrils daily.  05/19/20  Yes Leslye Peer, MD  latanoprost (XALATAN) 0.005 % ophthalmic solution  Place 1 drop into both eyes at bedtime.   Yes [provider]  levothyroxine (SYNTHROID, LEVOTHROID) 88 MCG tablet Take 88 mcg by mouth daily before breakfast.   Yes [provider]  meclizine (ANTIVERT) 12.5 MG tablet Take 12.5 mg by mouth every 8 (eight) hours as needed for dizziness. 02/21/20  Yes [provider]  Multiple Vitamin (MULTIVITAMIN WITH MINERALS) TABS tablet Take 1 tablet by mouth daily.    Yes [provider]  pantoprazole (PROTONIX) 40 MG tablet Take 40 mg by mouth 2 (two) times daily before a meal.   Yes [provider]  TRELEGY ELLIPTA 100-62.5-25 MCG/INH AEPB Inhale 1 puff into the lungs daily. 05/27/20  Yes Leslye Peer, MD  valsartan (DIOVAN) 160 MG tablet Take 1 tablet (160 mg total) by mouth daily. 04/17/20  Yes Byrum, Les Pou, MD  VENTOLIN HFA 108 (90 Base) MCG/ACT inhaler INHALE 2 PUFFS BY MOUTH EVERY 4 HOURS AS NEEDED FOR WHEEZING AND FOR SHORTNESS OF BREATH Patient taking differently: Inhale 2 puffs into the lungs every 4 (four) hours as needed for wheezing or shortness of breath.  03/27/20  Yes Leslye Peer, MD    Allergies as of 05/26/2020 - Review Complete 05/22/2020  Allergen Reaction Noted  . Bactrim [sulfamethoxazole-trimethoprim] Hives, Itching, and Other (See Comments) 01/29/2013  . Plavix [clopidogrel bisulfate] Hives 04/14/2017  . Codeine Palpitations 02/02/2013    Family History  Problem Relation Age of Onset  . Congestive Heart Failure Mother   . COPD Father   . Emphysema Father   . CAD Brother   . Breast cancer Sister   . Heart disease Sister   . Uterine cancer Daughter     Social History   Socioeconomic History  . Marital status: Widowed    Spouse name: Not on file  . Number of children: Not on file  . Years of education: Not on file  . Highest education level: Not on file  Occupational History  . Occupation: retired-worked at [employer]  Tobacco Use  . Smoking status: Former Smoker     Packs/day: 0.25    Years: 40.00    Pack years: 10.00    Types: Cigarettes    Start date: 08/04/1956    Quit date: 03/24/2017    Years since quitting: 3.2  . Smokeless tobacco: Never Used  . Tobacco comment: 2 -3  Vaping Use  . Vaping Use: Never used  Substance and Sexual Activity  . Alcohol use: No    Alcohol/week: 0.0 standard drinks  . Drug use: No  . Sexual activity: Never  Other Topics Concern  . Not on file  Social History Narrative  . Not on file   Social Determinants of Health   Financial Resource Strain:   . Difficulty of Paying Living Expenses: Not on file  Food Insecurity:   . Worried About Programme researcher, broadcasting/film/video in the Last Year: Not on file  . Ran Out of Food in the Last Year: Not on file  Transportation Needs:   . Lack of Transportation (Medical): Not on file  .  Lack of Transportation (Non-Medical): Not on file  Physical Activity:   . Days of Exercise per Week: Not on file  . Minutes of Exercise per Session: Not on file  Stress:   . Feeling of Stress : Not on file  Social Connections:   . Frequency of Communication with Friends and Family: Not on file  . Frequency of Social Gatherings with Friends and Family: Not on file  . Attends Religious Services: Not on file  . Active Member of Clubs or Organizations: Not on file  . Attends Banker Meetings: Not on file  . Marital Status: Not on file  Intimate Partner Violence:   . Fear of Current or Ex-Partner: Not on file  . Emotionally Abused: Not on file  . Physically Abused: Not on file  . Sexually Abused: Not on file    Review of Systems: See HPI, otherwise negative ROS  Physical Exam: BP (!) 166/83   Temp 97.9 F (36.6 C) (Oral)   Resp 16   Ht 5\' 3"  (1.6 m)   SpO2 97%   BMI 25.37 kg/m  General:   Alert,  Well-developed, well-nourished, pleasant and cooperative in NAD Lungs:  Clear throughout to auscultation.   No wheezes, crackles, or rhonchi. No acute distress. Heart:  Regular rate  and rhythm; no murmurs, clicks, rubs,  or gallops. Abdomen: Non-distended, normal bowel sounds.  Soft and nontender without appreciable mass or hepatosplenomegaly.  Pulses:  Normal pulses noted. Extremities:  Without clubbing or edema.  Impression/Plan: 79 year old lady with postprandial upper abdominal bloating, postprandial nausea and vomiting and chronic "reflux" symptoms.  Not improved with PPI.  Intermittent esophageal dysphagia. Has cholelithiasis on CT.  I have offered the patient an EGD with possible esophageal dilation as feasible/appropriate today per plan.  The risks, benefits, limitations, alternatives and imponderables have been reviewed with the patient. Potential for esophageal dilation, biopsy, etc. have also been reviewed.  Questions have been answered. All parties agreeable.     Notice: This dictation was prepared with Dragon dictation along with smaller phrase technology. Any transcriptional errors that result from this process are unintentional and may not be corrected upon review.

## 2020-06-18 NOTE — Discharge Instructions (Signed)
EGD Discharge instructions Please read the instructions outlined below and refer to this sheet in the next few weeks. These discharge instructions provide you with general information on caring for yourself after you leave the hospital. Your doctor may also give you specific instructions. While your treatment has been planned according to the most current medical practices available, unavoidable complications occasionally occur. If you have any problems or questions after discharge, please call your doctor. ACTIVITY  You may resume your regular activity but move at a slower pace for the next 24 hours.   Take frequent rest periods for the next 24 hours.   Walking will help expel (get rid of) the air and reduce the bloated feeling in your abdomen.   No driving for 24 hours (because of the anesthesia (medicine) used during the test).   You may shower.   Do not sign any important legal documents or operate any machinery for 24 hours (because of the anesthesia used during the test).  NUTRITION  Drink plenty of fluids.   You may resume your normal diet.   Begin with a light meal and progress to your normal diet.   Avoid alcoholic beverages for 24 hours or as instructed by your caregiver.  MEDICATIONS  You may resume your normal medications unless your caregiver tells you otherwise.  WHAT YOU CAN EXPECT TODAY  You may experience abdominal discomfort such as a feeling of fullness or "gas" pains.  FOLLOW-UP  Your doctor will discuss the results of your test with you.  SEEK IMMEDIATE MEDICAL ATTENTION IF ANY OF THE FOLLOWING OCCUR:  Excessive nausea (feeling sick to your stomach) and/or vomiting.   Severe abdominal pain and distention (swelling).   Trouble swallowing.   Temperature over 101 F (37.8 C).   Rectal bleeding or vomiting of blood.    Your stomach was inflamed.  I took biopsies.  Your esophagus was narrowed.  Your esophagus was dilated today  Continue Protonix 40  mg twice daily  Gallbladder ultrasound in the near future to further evaluate your symptoms  Further recommendations to follow pending review of pathology report  At patient request, I called Enid Baas at [phone] reviewed findings and recommendations   Esophageal Dilatation Esophageal dilatation, also called esophageal dilation, is a procedure to widen or open (dilate) a blocked or narrowed part of the esophagus. The esophagus is the part of the body that moves food and liquid from the mouth to the stomach. You may need this procedure if:  You have a buildup of scar tissue in your esophagus that makes it difficult, painful, or impossible to swallow. This can be caused by gastroesophageal reflux disease (GERD).  You have cancer of the esophagus.  There is a problem with how food moves through your esophagus. In some cases, you may need this procedure repeated at a later time to dilate the esophagus gradually. Tell a health care provider about:  Any allergies you have.  All medicines you are taking, including vitamins, herbs, eye drops, creams, and over-the-counter medicines.  Any problems you or family members have had with anesthetic medicines.  Any blood disorders you have.  Any surgeries you have had.  Any medical conditions you have.  Any antibiotic medicines you are required to take before dental procedures.  Whether you are pregnant or may be pregnant. What are the risks? Generally, this is a safe procedure. However, problems may occur, including:  Bleeding due to a tear in the lining of the esophagus.  A hole (  perforation) in the esophagus. What happens before the procedure?  Follow instructions from your health care provider about eating or drinking restrictions.  Ask your health care provider about changing or stopping your regular medicines. This is especially important if you are taking diabetes medicines or blood thinners.  Plan to have someone take  you home from the hospital or clinic.  Plan to have a responsible adult care for you for at least 24 hours after you leave the hospital or clinic. This is important. What happens during the procedure?  You may be given a medicine to help you relax (sedative).  A numbing medicine may be sprayed into the back of your throat, or you may gargle the medicine.  Your health care provider may perform the dilatation using various surgical instruments, such as: ? Simple dilators. This instrument is carefully placed in the esophagus to stretch it. ? Guided wire bougies. This involves using an endoscope to insert a wire into the esophagus. A dilator is passed over this wire to enlarge the esophagus. Then the wire is removed. ? Balloon dilators. An endoscope with a small balloon at the end is inserted into the esophagus. The balloon is inflated to stretch the esophagus and open it up. The procedure may vary among health care providers and hospitals. What happens after the procedure?  Your blood pressure, heart rate, breathing rate, and blood oxygen level will be monitored until the medicines you were given have worn off.  Your throat may feel slightly sore and numb. This will improve slowly over time.  You will not be allowed to eat or drink until your throat is no longer numb.  When you are able to drink, urinate, and sit on the edge of the bed without nausea or dizziness, you may be able to return home. Follow these instructions at home:  Take over-the-counter and prescription medicines only as told by your health care provider.  Do not drive for 24 hours if you were given a sedative during your procedure.  You should have a responsible adult with you for 24 hours after the procedure.  Follow instructions from your health care provider about any eating or drinking restrictions.  Do not use any products that contain nicotine or tobacco, such as cigarettes and e-cigarettes. If you need help  quitting, ask your health care provider.  Keep all follow-up visits as told by your health care provider. This is important. Get help right away if you:  Have a fever.  Have chest pain.  Have pain that is not relieved by medication.  Have trouble breathing.  Have trouble swallowing.  Vomit blood. Summary  Esophageal dilatation, also called esophageal dilation, is a procedure to widen or open (dilate) a blocked or narrowed part of the esophagus.  Plan to have someone take you home from the hospital or clinic.  For this procedure, a numbing medicine may be sprayed into the back of your throat, or you may gargle the medicine.  Do not drive for 24 hours if you were given a sedative during your procedure. This information is not intended to replace advice given to you by your health care provider. Make sure you discuss any questions you have with your health care provider. Document Revised: 08/01/2019 Document Reviewed: 08/09/2017 Elsevier Patient Education  2020 [employer].

## 2020-06-19 ENCOUNTER — Other Ambulatory Visit: Payer: Self-pay

## 2020-06-19 ENCOUNTER — Telehealth: Payer: Self-pay | Admitting: Internal Medicine

## 2020-06-19 LAB — SURGICAL PATHOLOGY

## 2020-06-19 NOTE — Telephone Encounter (Signed)
Pt called to say she had a procedure yesterday by Dr Jena Gauss and she thinks Dr Jena Gauss had mentioned to her that she has a small aneurysm and how could she find out for her peace of mind. [phone]

## 2020-06-19 NOTE — Telephone Encounter (Signed)
Spoke with pt. Pt is going to wait for her results to come back. Pt didn't want a message sent to Dr. Jena Gauss. Pt will await her pathology results.

## 2020-06-22 ENCOUNTER — Encounter: Payer: Self-pay | Admitting: Internal Medicine

## 2020-06-24 ENCOUNTER — Encounter (HOSPITAL_COMMUNITY): Payer: Self-pay | Admitting: Internal Medicine

## 2020-06-26 ENCOUNTER — Telehealth: Payer: Self-pay | Admitting: Internal Medicine

## 2020-06-26 DIAGNOSIS — K219 Gastro-esophageal reflux disease without esophagitis: Secondary | ICD-10-CM

## 2020-06-26 NOTE — Telephone Encounter (Signed)
Routing to [facility] Refill box, refill request for Pantoprazole 40 mg bid.

## 2020-06-26 NOTE — Telephone Encounter (Signed)
Patient protonix (generic) needs to be sent in for 2 a day to [facility] in [place],

## 2020-06-30 ENCOUNTER — Telehealth: Payer: Self-pay | Admitting: Internal Medicine

## 2020-06-30 ENCOUNTER — Other Ambulatory Visit: Payer: Self-pay

## 2020-06-30 DIAGNOSIS — R1010 Upper abdominal pain, unspecified: Secondary | ICD-10-CM

## 2020-06-30 DIAGNOSIS — R112 Nausea with vomiting, unspecified: Secondary | ICD-10-CM

## 2020-06-30 DIAGNOSIS — R109 Unspecified abdominal pain: Secondary | ICD-10-CM

## 2020-06-30 MED ORDER — PANTOPRAZOLE SODIUM 40 MG PO TBEC: 40.0000 mg | DELAYED_RELEASE_TABLET | Freq: Two times a day (BID) | ORAL | 1 refills | Status: DC

## 2020-06-30 NOTE — Telephone Encounter (Signed)
Spoke with pt. Pt had an EGS on 06/18/20. Pt was having vomiting after lunch time prior to her EGD. Pt is now having chocking and vomiting after she eats or drinks anything. Pt is having upper abdominal pain between her breast/ribcage. Pt states the abdominal pain is comes and goes more now since pt had her procedure. Pt also mentioned rt shoulder pain that has been present for 6 weeks.

## 2020-06-30 NOTE — Telephone Encounter (Signed)
Noted. Spoke with pt. Pt is going to have labs completed at [facility] when pt goes to have CT done. Lab orders will be faxed.

## 2020-06-30 NOTE — Telephone Encounter (Signed)
Routing to Mindy.  

## 2020-06-30 NOTE — Addendum Note (Signed)
Addended by: Delane Ginger, Alice Burnside A on: 06/30/2020 08:01 PM   Modules accepted: Orders

## 2020-06-30 NOTE — Telephone Encounter (Signed)
We need to order an US abdomen complete. Known calcified gallstones. Needs HFP, lipase.   Back down to clear liquids, bland diet. Avoid fatty/greasy foods.   Further recommendations by Tana Coast, PA-C, tomorrow.

## 2020-06-30 NOTE — Telephone Encounter (Signed)
[place] scheduled for 9/20 at 9:30am, arrival 9:15am, npo midnight.   Called pt and made aware of appt details. She voiced understanding.

## 2020-06-30 NOTE — Addendum Note (Signed)
Addended by: Armstead Peaks on: 06/30/2020 11:33 AM   Modules accepted: Orders

## 2020-06-30 NOTE — Telephone Encounter (Signed)
Pt had an EGD done by Dr Jena Gauss on 06/18/2020 and she said that she was still hurting. Please advise. [phone]

## 2020-07-01 NOTE — Telephone Encounter (Signed)
It is not unusual for BMs to slow down if she is not eating/drinking well and vomiting. She can add something mild like Miralax 17grams po bid or take a dulcolax 10mg  po once.   For GERD, continue pantoprazole BID. Can use tums or rolaids prn.   Eat bland soft diet. Avoid greasy foods.   Await labs and u/s.  Go to ER if symptoms worsen.   Would she like zofran sent to pharmacy for n/v?

## 2020-07-01 NOTE — Telephone Encounter (Signed)
Pt called back and wanted to mention that she's been having severe Gerd symptoms. Gerd symptoms started this weekend. Pt will get pain in her stomach and burning sensations will move to her chest all the way up to pts throat. Pt says it isn't because she's eaten anything wrong. Pt hasn't been eating that much do to vomiting food back up. Pt is taking Pantoprazole 40 mg bid 30 mins prior to breakfast and dinner.

## 2020-07-01 NOTE — Telephone Encounter (Signed)
Her u/s is scheduled for next Monday. I would suggest she go for labs if still having significant abdominal pain, it is better to have them drawn when she is symptomatic or 6 hours or so after an episode to try and "catch" a bump in her LFTs which would then suggest a biliary (gallbladder) issue.

## 2020-07-01 NOTE — Telephone Encounter (Signed)
Spoke with pt. Pt is going to have labs done today at [facility]. Pt wanted to mention that her last BM was Friday 06-27-20. Pt reports that she's bloated and vomiting after eating or drinking. Pt states this isn't normal for her to go this long without a BM. Pt states she usually has a BM 3-4 times daily of watery stool and pts doctor stated that pt would do this since pts reversal of colon.

## 2020-07-02 ENCOUNTER — Telehealth: Payer: Self-pay | Admitting: Internal Medicine

## 2020-07-02 ENCOUNTER — Other Ambulatory Visit: Payer: Self-pay

## 2020-07-02 ENCOUNTER — Other Ambulatory Visit (HOSPITAL_COMMUNITY)
Admission: RE | Admit: 2020-07-02 | Discharge: 2020-07-02 | Disposition: A | Discharge status: Home / Self Care | Payer: Medicare Other | Source: Ambulatory Visit | Attending: Gastroenterology | Admitting: Gastroenterology

## 2020-07-02 DIAGNOSIS — R1010 Upper abdominal pain, unspecified: Secondary | ICD-10-CM | POA: Insufficient documentation

## 2020-07-02 LAB — HEPATIC FUNCTION PANEL
ALT: 21 U/L (ref 0–44)
AST: 27 U/L (ref 15–41)
Albumin: 4 g/dL (ref 3.5–5.0)
Alkaline Phosphatase: 69 U/L (ref 38–126)
Bilirubin, Direct: 0.1 mg/dL (ref 0.0–0.2)
Indirect Bilirubin: 0.7 mg/dL (ref 0.3–0.9)
Total Bilirubin: 0.8 mg/dL (ref 0.3–1.2)
Total Protein: 6.7 g/dL (ref 6.5–8.1)

## 2020-07-02 LAB — LIPASE, BLOOD: Lipase: 27 U/L (ref 11–51)

## 2020-07-02 MED ORDER — ONDANSETRON 4 MG PO TBDP: 4.0000 mg | ORAL_TABLET | Freq: Three times a day (TID) | ORAL | 0 refills | Status: DC | PRN

## 2020-07-02 NOTE — Telephone Encounter (Signed)
Correction, pt called back and would like Zofran sent to her pharmacy.

## 2020-07-02 NOTE — Telephone Encounter (Signed)
Patient called and said someone called her from here, please call back if it was [place]

## 2020-07-02 NOTE — Telephone Encounter (Signed)
FYI Spoke with pt. Pt was notified of recommendations of [place], [place]. Pt will add Miralax or Dulcolax 10 mg as directed. Pt will continue Pantoprazole 40 mg bid and add tums or rolaids. Pt will continue to avoid fatty greasy foods ect. Pt will complete labs this morning and await CT scan. Pt is currently taking Meclizine and doesn't want Zofran called in at this time.

## 2020-07-02 NOTE — Telephone Encounter (Signed)
Noted  

## 2020-07-02 NOTE — Telephone Encounter (Signed)
zofran sent to pharmacy. 

## 2020-07-02 NOTE — Addendum Note (Signed)
Addended by: Tiffany Kocher on: 07/02/2020 11:18 AM   Modules accepted: Orders

## 2020-07-02 NOTE — Telephone Encounter (Signed)
Message was sent to provider. 

## 2020-07-07 ENCOUNTER — Telehealth: Payer: Self-pay | Admitting: *Deleted

## 2020-07-07 ENCOUNTER — Ambulatory Visit (HOSPITAL_COMMUNITY)
Admission: RE | Admit: 2020-07-07 | Discharge: 2020-07-07 | Disposition: A | Discharge status: Home / Self Care | Payer: Medicare Other | Source: Ambulatory Visit | Attending: Gastroenterology | Admitting: Gastroenterology

## 2020-07-07 ENCOUNTER — Other Ambulatory Visit: Payer: Self-pay | Admitting: *Deleted

## 2020-07-07 ENCOUNTER — Other Ambulatory Visit: Payer: Self-pay

## 2020-07-07 ENCOUNTER — Encounter: Payer: Self-pay | Admitting: *Deleted

## 2020-07-07 ENCOUNTER — Ambulatory Visit: Payer: Medicare Other | Admitting: Internal Medicine

## 2020-07-07 DIAGNOSIS — N2889 Other specified disorders of kidney and ureter: Secondary | ICD-10-CM

## 2020-07-07 DIAGNOSIS — R112 Nausea with vomiting, unspecified: Secondary | ICD-10-CM

## 2020-07-07 DIAGNOSIS — R1010 Upper abdominal pain, unspecified: Secondary | ICD-10-CM

## 2020-07-07 DIAGNOSIS — R109 Unspecified abdominal pain: Secondary | ICD-10-CM | POA: Diagnosis present

## 2020-07-07 NOTE — Telephone Encounter (Signed)
HIDA scheduled for 10/6 at 10:00am, arrival 9:45am, npo midnight, no pain meds after midnight.  Called pt and made aware of HIDA appt details. She is also aware of CT Renal appt as well. She voiced understanding.

## 2020-07-07 NOTE — Addendum Note (Signed)
Addended by: Armstead Peaks on: 07/07/2020 02:01 PM   Modules accepted: Orders

## 2020-07-07 NOTE — Telephone Encounter (Signed)
We can arrange a HIDA scan to check her gallbladder.

## 2020-07-07 NOTE — Telephone Encounter (Signed)
Called pt and she would like to proceed with HIDA scan as long as her insurance will cover it.

## 2020-07-07 NOTE — Telephone Encounter (Signed)
Pt called back and would like to know what we plan on doing for gallstones before 08/20/20.  She also would like to proceed with renal protocol CT as soon as possible.  She said that she is still having pain in side and in forks of her breast. She has been throwing up some but nausea medicine seems to help.

## 2020-07-08 NOTE — Telephone Encounter (Signed)
Noted. Await pending results.ov as planned.

## 2020-07-09 ENCOUNTER — Encounter: Payer: Self-pay | Admitting: Internal Medicine

## 2020-07-09 ENCOUNTER — Other Ambulatory Visit: Payer: Self-pay

## 2020-07-09 ENCOUNTER — Ambulatory Visit: Payer: Medicare Other | Admitting: Internal Medicine

## 2020-07-09 ENCOUNTER — Other Ambulatory Visit: Payer: Self-pay | Admitting: Emergency Medicine

## 2020-07-09 ENCOUNTER — Ambulatory Visit (HOSPITAL_COMMUNITY)
Admission: RE | Admit: 2020-07-09 | Discharge: 2020-07-09 | Disposition: A | Discharge status: Home / Self Care | Payer: Medicare Other | Source: Ambulatory Visit | Attending: Internal Medicine | Admitting: Internal Medicine

## 2020-07-09 VITALS — BP 126/58 | HR 67 | Temp 97.6°F | Ht 63.0 in | Wt 139.0 lb

## 2020-07-09 DIAGNOSIS — J449 Chronic obstructive pulmonary disease, unspecified: Secondary | ICD-10-CM | POA: Insufficient documentation

## 2020-07-09 DIAGNOSIS — Z23 Encounter for immunization: Secondary | ICD-10-CM

## 2020-07-09 NOTE — Patient Instructions (Addendum)
Stay as active as you possibly can   Plan A = Automatic = Always=    Trelegy one click each am   Plan B = Backup (to supplement plan A, not to replace it) Only use your albuterol inhaler as a rescue medication to be used if you can't catch your breath by resting or doing a relaxed purse lip breathing pattern.  - The less you use it, the better it will work when you need it. - Ok to use the inhaler up to 2 puffs  every 4 hours if you must but call for appointment if use goes up over your usual need - Don't leave home without it !!  (think of it like the spare tire for your car)   Plan C = Crisis (instead of Plan B but only if Plan B stops working) - only use your albuterol nebulizer if you first try Plan B and it fails to help > ok to use the nebulizer up to every 4 hours but if start needing it regularly call for immediate appointment  Plan D = Doctor  - call right away if ABC not working    Make sure you check your oxygen saturations at highest level of activity to be sure it stays over 90% and keep track of it at least once a week, more often if breathing getting worse, and let me know if losing ground.   Please remember to go to the  x-ray department  @  [facility] for your tests - we will call you with the results when they are available     Please schedule a follow up visit in 12 months but call sooner if needed

## 2020-07-09 NOTE — Progress Notes (Addendum)
April Hull, female    DOB: Jan 31, 1941, 79 y.o.   MRN: [id number]   Brief patient profile:  78 yowf quit smoking 03/2017 with  GOLD 2 criteria copd then and maint on trelegy by Dr Delton Coombes referred to pulmonary clinic in [place]  07/09/2020 by Dr  Sherril Croon re possible abdominal surgery.      History of Present Illness  07/09/2020  Pulmonary/ 1st office eval/ April [facility] / [facility] / trelegy  Chief Complaint  Patient presents with  . 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   Sleeping as above  without nocturnal  or early am exacerbation  of respiratory  c/o's or need for noct saba. Also denies any obvious fluctuation of symptoms with weather or environmental changes or other aggravating or alleviating factors except as outlined above   No unusual exposure hx or h/o childhood pna/ asthma or knowledge of premature birth.  Current Allergies, Complete Past Medical History, Past Surgical History, Family History, and Social History were reviewed in April [facility] record.  ROS  The following are not active complaints unless bolded Hoarseness, sore throat, dysphagia, dental problems, itching, sneezing,  nasal congestion or discharge of excess mucus or purulent secretions, ear ache,   fever, chills, sweats, unintended wt loss or wt gain, classically pleuritic or exertional cp,  orthopnea pnd or arm/hand swelling  or leg swelling, presyncope, palpitations, abdominal pain, anorexia, nausea, vomiting, diarrhea  or change in bowel habits or change in bladder habits, change in stools or change in urine, dysuria,  hematuria,  rash, arthralgias, visual complaints, headache, numbness, weakness or ataxia or problems with walking or coordination,  change in mood or  memory.           Past Medical History:  Diagnosis Date  . Anxiety   . Aortic stenosis   . Colon polyp   . COPD (chronic obstructive pulmonary disease) (HCC)   . Diarrhea   . Essential hypertension   . GERD (gastroesophageal reflux disease)   . History of blood transfusion   . History of pneumonia   . Hypothyroidism   . Mixed hyperlipidemia   . OA (osteoarthritis of the spine)   . Osteoporosis   . 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 Sigmoid diverticulitis     Outpatient Medications Prior to Visit  Medication Sig Dispense Refill  . albuterol (PROVENTIL) (2.5 MG/3ML) 0.083% nebulizer solution Take 3 mLs (2.5 mg total) by nebulization every 6 (six) hours as needed for wheezing or shortness of breath. 75 mL 3  . aspirin EC 81 MG tablet Take 1 tablet (81 mg total) by mouth daily. 90 tablet 3  . atorvastatin (LIPITOR) 10 MG tablet Take 10 mg by mouth at bedtime.     . Calcium Carbonate-Vitamin D (CALCIUM 500/D PO) Take 1 tablet by mouth daily.    . EQ LORATADINE 10 MG tablet Take 1 tablet by mouth once daily (Patient taking differently: Take 10 mg by  mouth daily. ) 90 tablet 3  . fluticasone (FLONASE) 50 MCG/ACT nasal spray Use 2 spray(s) in each nostril once daily (Patient taking differently: Place 2 sprays into both nostrils daily. ) 48 g 0  . latanoprost (XALATAN) 0.005 % ophthalmic solution Place 1 drop into both eyes at bedtime.    Marland Kitchen levothyroxine (SYNTHROID, LEVOTHROID) 88 MCG tablet Take 88 mcg by mouth daily before breakfast.    . meclizine (ANTIVERT) 12.5 MG tablet Take 12.5 mg by mouth every 8 (eight) hours as needed for dizziness.    . Multiple Vitamin (MULTIVITAMIN WITH MINERALS) TABS tablet Take 1  tablet by mouth daily.     . ondansetron (ZOFRAN-ODT) 4 MG disintegrating tablet Take 1 tablet (4 mg total) by mouth every 8 (eight) hours as needed for nausea or vomiting. 20 tablet 0  . pantoprazole (PROTONIX) 40 MG tablet Take 1 tablet (40 mg total) by mouth 2 (two) times daily before a meal. 180 tablet 1  . TRELEGY ELLIPTA 100-62.5-25 MCG/INH AEPB Inhale 1 puff into the lungs daily. 60 each 5  . valsartan (DIOVAN) 160 MG tablet Take 1 tablet (160 mg total) by mouth daily. 90 tablet 3  . VENTOLIN HFA 108 (90 Base) MCG/ACT inhaler INHALE 2 PUFFS BY MOUTH EVERY 4 HOURS AS NEEDED FOR WHEEZING AND FOR SHORTNESS OF BREATH (Patient taking differently: Inhale 2 puffs into the lungs every 4 (four) hours as needed for wheezing or shortness of breath. ) 18 g 5   No facility-administered medications prior to visit.     Objective:     BP (!) 126/58 (BP Location: Left Arm, Cuff Size: Normal)   Pulse 67   Temp 97.6 F (36.4 C) (Other (Comment)) Comment (Src): wrist  Ht 5\' 3"  (1.6 m)   Wt 139 lb (63 kg)   SpO2 96% Comment: Room air  BMI 24.62 kg/m   SpO2: 96 % (Room air)   Pleasant amb wf nad  HEENT : pt wearing mask not removed for exam due to covid -19 concerns.    NECK :  without JVD/Nodes/TM/ nl carotid upstrokes bilaterally   LUNGS: no acc muscle use,  Mod barrel  contour chest wall with bilateral  Distant bs s audible wheeze and  without cough on insp or exp maneuvers and mod  Hyperresonant  to  percussion bilaterally     CV:  RRR  no s3 or murmur or increase in P2, and no edema   ABD:  soft and nontender with pos mid insp Hoover's  in the supine position. No bruits or organomegaly appreciated, bowel sounds nl  MS:     ext warm without deformities, calf tenderness, cyanosis or clubbing No obvious joint restrictions   SKIN: warm and dry without lesions    NEURO:  alert, approp, nl sensorium with  no motor or cerebellar deficits apparent.       CXR PA and Lateral:   07/09/2020  :    I personally reviewed images and agree with radiology impression as follows:    1. Mild enlargement of the cardiopericardial silhouette, without edema. Aortic valve replacement noted. Mitral valve calcification. 2. Emphysema. 3.  Aortic Atherosclerosis (ICD10-I70.0). 4. Cholelithiasis.      Assessment   COPD GOLD 2 Quit smoking  02/2017 - PFT's  03/09/17  FEV1 1.13 (58 % ) ratio 0.59    with DLCO  11.80 (51%) corrects to 3.07 (65%)  for alv volume and FV curve mild concavity only   - 07/09/2020  After extensive coaching inhaler device,  effectiveness =    75% (short ti) -  07/09/2020   Walked RA  approx   600 ft  @ avg pace  stopped due to  End of study with sats 96%  At end   Group D in terms of symptom/risk and laba/lama/ICS  therefore appropriate rx at this point >>>  Continue trelegy and prn saba  Re saba I spent extra time with pt today reviewing appropriate use of albuterol for prn use on exertion with the following points: 1) saba is for relief of sob that does not improve by walking a slower pace or resting but rather if the pt does not improve after trying this first. 2) If the pt is convinced, as many are, that saba helps recover from activity faster then it's easy to tell if this is the case by re-challenging : ie stop, take the inhaler, then p 5 minutes try the exact same activity (intensity of workload) that just caused the symptoms and see if they are substantially diminished or not after saba 3) if there is an activity that reproducibly causes the symptoms, try the saba 15 min before the activity on alternate days   If in fact the saba really does help, then fine to continue to use it prn but advised may need to look closer at the maintenance regimen being used to achieve better control of airways disease with exertion.   Re possible abd surgery:  She is at moderate risk of resp complications for upper abd surgery (esp open cholecystectomy with atx, resp failure, HCAP,  vent dependency at least short term)  but they are not prohibitive by any means - I simply would make sure the benefits of the surgery are quite high and convincing in this setting to tilt the benefit / risk ratio in favor of any elective procedure vs other alternatives .  Discussed in detail all the  indications, usual  risks and alternatives  relative to the benefits with patient who will discuss with GI and surgery in the coming weeks.          Each maintenance medication was reviewed in detail including emphasizing most importantly the difference between maintenance and prns and under what circumstances the prns are to be triggered using an action plan format where appropriate.  Total time for H and P, chart review, counseling, teaching hfa device  directly observing portions of ambulatory 02 saturation study/  and generating customized AVS unique to this office visit / charting = 43 min          Sandrea Hughs, MD 07/09/2020

## 2020-07-10 ENCOUNTER — Encounter: Payer: Self-pay | Admitting: Internal Medicine

## 2020-07-10 NOTE — Progress Notes (Signed)
Spoke with pt and notified of results per Dr. Wert. Pt verbalized understanding and denied any questions. 

## 2020-07-10 NOTE — Assessment & Plan Note (Addendum)
Quit smoking  02/2017 - PFT's  03/09/17  FEV1 1.13 (58 % ) ratio 0.59    with DLCO  11.80 (51%) corrects to 3.07 (65%)  for alv volume and FV curve mild concavity only   - 07/09/2020  After extensive coaching inhaler device,  effectiveness =    75% (short ti) -  07/09/2020   Walked RA  approx   600 ft  @ avg pace  stopped due to  End of study with sats 96%  At end   Group D in terms of symptom/risk and laba/lama/ICS  therefore appropriate rx at this point >>>  Continue trelegy and prn saba  Re saba I spent extra time with pt today reviewing appropriate use of albuterol for prn use on exertion with the following points: 1) saba is for relief of sob that does not improve by walking a slower pace or resting but rather if the pt does not improve after trying this first. 2) If the pt is convinced, as many are, that saba helps recover from activity faster then it's easy to tell if this is the case by re-challenging : ie stop, take the inhaler, then p 5 minutes try the exact same activity (intensity of workload) that just caused the symptoms and see if they are substantially diminished or not after saba 3) if there is an activity that reproducibly causes the symptoms, try the saba 15 min before the activity on alternate days   If in fact the saba really does help, then fine to continue to use it prn but advised may need to look closer at the maintenance regimen being used to achieve better control of airways disease with exertion.   Re possible abd surgery:  She is at moderate risk of resp complications for upper abd surgery (esp open cholecystectomy with atx, resp failure, HCAP, vent dependency at least short term)  but they are not prohibitive by any means - I simply would make sure the benefits of the surgery are quite high and convincing in this setting to tilt the benefit / risk ratio in favor of any elective procedure vs other alternatives .  Discussed in detail all the  indications, usual  risks and  alternatives  relative to the benefits with patient who will discuss with GI and surgery in the coming weeks.          Each maintenance medication was reviewed in detail including emphasizing most importantly the difference between maintenance and prns and under what circumstances the prns are to be triggered using an action plan format where appropriate.  Total time for H and P, chart review, counseling, teaching hfa device  directly observing portions of ambulatory 02 saturation study/  and generating customized AVS unique to this office visit / charting = 43 min

## 2020-07-11 ENCOUNTER — Telehealth: Payer: Self-pay | Admitting: *Deleted

## 2020-07-11 NOTE — Telephone Encounter (Signed)
Yes, she may proceed with a HIDA scan.

## 2020-07-11 NOTE — Telephone Encounter (Signed)
Pt having HIDA scan next week Tuesday and wanted to make sure Dr Diona Browner was aware and that this was ok with hx valve replacement

## 2020-07-11 NOTE — Telephone Encounter (Signed)
Pt aware and appreciative  

## 2020-07-21 ENCOUNTER — Other Ambulatory Visit: Payer: Self-pay

## 2020-07-21 ENCOUNTER — Ambulatory Visit (HOSPITAL_COMMUNITY)
Admission: RE | Admit: 2020-07-21 | Discharge: 2020-07-21 | Disposition: A | Discharge status: Home / Self Care | Payer: Medicare Other | Source: Ambulatory Visit | Attending: Gastroenterology | Admitting: Gastroenterology

## 2020-07-21 DIAGNOSIS — N2889 Other specified disorders of kidney and ureter: Secondary | ICD-10-CM | POA: Diagnosis present

## 2020-07-21 LAB — POCT I-STAT CREATININE: Creatinine, Ser: 0.6 mg/dL (ref 0.44–1.00)

## 2020-07-21 MED ORDER — IOHEXOL 300 MG/ML  SOLN
100.0000 mL | Freq: Once | INTRAMUSCULAR | Status: AC | PRN
  Administered 2020-07-21: 100 mL via INTRAVENOUS

## 2020-07-23 ENCOUNTER — Encounter (HOSPITAL_COMMUNITY)
Admission: RE | Admit: 2020-07-23 | Discharge: 2020-07-23 | Disposition: A | Discharge status: Home / Self Care | Payer: Medicare Other | Source: Ambulatory Visit | Attending: Gastroenterology | Admitting: Gastroenterology

## 2020-07-23 ENCOUNTER — Other Ambulatory Visit: Payer: Self-pay

## 2020-07-23 ENCOUNTER — Encounter: Payer: Self-pay | Admitting: Gastroenterology

## 2020-07-23 ENCOUNTER — Ambulatory Visit: Payer: Medicare Other | Admitting: Gastroenterology

## 2020-07-23 DIAGNOSIS — R1010 Upper abdominal pain, unspecified: Secondary | ICD-10-CM | POA: Insufficient documentation

## 2020-07-23 DIAGNOSIS — R1011 Right upper quadrant pain: Secondary | ICD-10-CM

## 2020-07-23 DIAGNOSIS — N289 Disorder of kidney and ureter, unspecified: Secondary | ICD-10-CM

## 2020-07-23 DIAGNOSIS — K8020 Calculus of gallbladder without cholecystitis without obstruction: Secondary | ICD-10-CM | POA: Diagnosis not present

## 2020-07-23 DIAGNOSIS — R112 Nausea with vomiting, unspecified: Secondary | ICD-10-CM | POA: Insufficient documentation

## 2020-07-23 MED ORDER — TECHNETIUM TC 99M MEBROFENIN IV KIT
5.0000 | PACK | Freq: Once | INTRAVENOUS | Status: AC | PRN
  Administered 2020-07-23: 4.6 via INTRAVENOUS

## 2020-07-23 NOTE — Patient Instructions (Signed)
1. I will be in touch with you with further recommendations once I discuss HIDA findings with Dr. Jena Gauss. 2. We will also let you know if you need additional imaging for left kidney lesion.

## 2020-07-23 NOTE — Progress Notes (Signed)
Primary Care Physician: [name], MD  Primary Gastroenterologist:  Roetta Sessions, MD   Chief Complaint  Patient presents with  . Emesis    w/ nausea. zofran not helping  . Abdominal Pain    RUQ    HPI: April Hull is a 79 y.o. female here for follow-up.  Last seen in July 2021 with 1 year history of frequent vomiting.  Typical GERD symptoms well controlled.  See work up as outlined below. She feels food and liquid coming up her esophagus and then either has vomiting or diarrhea. Usually occurs 30 minutes after eating. Heaves several times until emptied out and taste bad. Doesn't seem to matter what she eats. Back in 04/2020 she had first episode of RUQ pain which radiates into flank. Notes it more when she lays down. Not sure that it is meal related. Has taken TUMS and Tylenol to try and help it. She tries not to overeat or eat late. BM 3-4 times daily but some days only once. If she lays down on her left side she often has to get up to have a BM. All of this notes since her colon surgery. No melena, brbpr. Zofran not helping.   Work-up thus far includes:  -CT abdomen pelvis with contrast July 2021 with no explanation for vomiting or recent abdominal pain.  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  Calcification is noted at the mitral annulus.  Recommend following up with cardiology.  -EGD September 2021 with moderate Schatzki ring status post dilation, medium sized hiatal hernia, scattered erosions and streaky erythema of the gastric mucosa (biopsies benign, no H. Pylori).  -Abdominal ultrasound July 07, 2020: Cholelithiasis but no gallbladder wall thickening.  Possible slightly hyperechoic mass in the mid right kidney measuring 1.9 cm.  Fatty liver.  -CT renal abdomen with and without contrast October 2021: Gallstone noted in the neck of the gallbladder measuring 1 cm without  gallbladder wall inflammation or biliary ductal dilation.  No kidney mass identified.  No CT correlate to the ultrasound finding.  Small low-density structure within the inferior pole of the left kidney measuring 7 mm although likely a simple cyst, too small to characterize.  Small to moderate hiatal hernia.  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  -Labs from September 2021: Lipase normal, LFTs normal  -HIDA today: normal GB EF. Patient states she vomited Ensure within minutes of taking it but she swallowed it back because she was laying flat on her back.   Current Outpatient Medications  Medication Sig Dispense Refill  . albuterol (PROVENTIL) (2.5 MG/3ML) 0.083% nebulizer solution Take 3 mLs (2.5 mg total) by nebulization every 6 (six) hours as needed for wheezing or shortness of breath. 75 mL 3  . aspirin EC 81 MG tablet Take 1 tablet (81 mg total) by mouth daily. 90 tablet 3  . atorvastatin (LIPITOR) 10 MG tablet Take 10 mg by mouth at bedtime.     . Calcium Carbonate-Vitamin D (CALCIUM 500/D PO) Take 1 tablet by mouth daily.    . EQ LORATADINE 10 MG tablet Take 1 tablet by mouth once daily (Patient taking differently: Take 10 mg by mouth daily. ) 90 tablet 3  . fluticasone (FLONASE) 50 MCG/ACT nasal spray Place 2 sprays into both nostrils daily. 48 g 2  . latanoprost (XALATAN) 0.005 % ophthalmic solution Place 1 drop into  both eyes at bedtime.    Marland Kitchen levothyroxine (SYNTHROID, LEVOTHROID) 88 MCG tablet Take 88 mcg by mouth daily before breakfast.    . meclizine (ANTIVERT) 12.5 MG tablet Take 12.5 mg by mouth every 8 (eight) hours as needed for dizziness.    . Multiple Vitamin (MULTIVITAMIN WITH MINERALS) TABS tablet Take 1 tablet by mouth daily.     . ondansetron (ZOFRAN-ODT) 4 MG disintegrating tablet Take 1 tablet (4 mg total) by mouth every 8 (eight) hours as needed for nausea or vomiting. 20 tablet 0  . pantoprazole (PROTONIX) 40 MG tablet Take 1 tablet  (40 mg total) by mouth 2 (two) times daily before a meal. 180 tablet 1  . TRELEGY ELLIPTA 100-62.5-25 MCG/INH AEPB Inhale 1 puff into the lungs daily. 60 each 5  . valsartan (DIOVAN) 160 MG tablet Take 1 tablet (160 mg total) by mouth daily. 90 tablet 3  . VENTOLIN HFA 108 (90 Base) MCG/ACT inhaler INHALE 2 PUFFS BY MOUTH EVERY 4 HOURS AS NEEDED FOR WHEEZING AND FOR SHORTNESS OF BREATH (Patient taking differently: Inhale 2 puffs into the lungs every 4 (four) hours as needed for wheezing or shortness of breath. ) 18 g 5   No current facility-administered medications for this visit.    Allergies as of 07/23/2020 - Review Complete 07/23/2020  Allergen Reaction Noted  . Bactrim [sulfamethoxazole-trimethoprim] Hives, Itching, and Other (See Comments) 01/29/2013  . Plavix [clopidogrel bisulfate] Hives and Rash 04/14/2017  . Codeine Palpitations 02/02/2013    ROS:  General: Negative for anorexia, weight loss, fever, chills, fatigue, weakness. ENT: Negative for hoarseness, difficulty swallowing , nasal congestion. CV: Negative for chest pain, angina, palpitations, dyspnea on exertion, peripheral edema. Respiratory: Negative for dyspnea at rest,+ dyspnea on exertion, cough, sputum, wheezing.  GI: See history of present illness. GU:  Negative for dysuria, hematuria, urinary incontinence, urinary frequency, nocturnal urination.  Endo: Negative for unusual weight change.    Physical Examination:   BP (!) 150/73   Pulse (!) 113   Temp (!) 97.5 F (36.4 C) (Oral)   Ht 5\' 3"  (1.6 m)   Wt 140 lb 9.6 oz (63.8 kg)   BMI 24.91 kg/m   General: Well-nourished, well-developed in no acute distress.  Eyes: No icterus. Mouth: masked Lungs: Clear to auscultation bilaterally.  Heart: Regular rate and rhythm, no murmurs rubs or gallops.  Abdomen: Bowel sounds are normal, nontender, nondistended, no hepatosplenomegaly or masses, no abdominal bruits or hernia , no rebound or guarding.   Extremities: No  lower extremity edema. No clubbing or deformities. Neuro: Alert and oriented x 4   Skin: Warm and dry, no jaundice.   Psych: Alert and cooperative, normal mood and affect.  Labs:  Lab Results  Component Value Date   CREATININE 0.60 07/21/2020   BUN 8 04/29/2020   NA 139 04/29/2020   K 3.9 04/29/2020   CL 101 04/29/2020   CO2 27 04/29/2020   Lab Results  Component Value Date   LIPASE 27 07/02/2020    Lab Results  Component Value Date   ALT 21 07/02/2020   AST 27 07/02/2020   ALKPHOS 69 07/02/2020   BILITOT 0.8 07/02/2020   Lab Results  Component Value Date   WBC 7.8 04/29/2020   HGB 13.5 04/29/2020   HCT 40.3 04/29/2020   MCV 93.5 04/29/2020   PLT 233 04/29/2020   No results found for: TSH    Imaging Studies: DG Chest 2 View  Result Date: 07/09/2020 CLINICAL DATA:  COPD and hypertension.  Aortic valve replacement. EXAM: CHEST - 2 VIEW COMPARISON:  Multiple exams, including 05/22/2019 FINDINGS: Mild enlargement of the cardiopericardial silhouette. Aortic valve replacement noted. Atherosclerotic calcification of the aortic arch. Calcification of the mitral valve. Radiodensity in the right upper quadrant favoring cholelithiasis. Degenerative glenohumeral arthropathy. Thoracic spondylosis. Large lung volumes indicating emphysema. IMPRESSION: 1. Mild enlargement of the cardiopericardial silhouette, without edema. Aortic valve replacement noted. Mitral valve calcification. 2. Emphysema. 3.  Aortic Atherosclerosis (ICD10-I70.0). 4. Cholelithiasis. Electronically Signed   By: Gaylyn Rong M.D.   On: 07/09/2020 16:21   US Abdomen Complete  Result Date: 07/07/2020 CLINICAL DATA:  Evaluate calcified gallstones. EXAM: ABDOMEN ULTRASOUND COMPLETE COMPARISON:  CT abdomen pelvis 05/01/2020 FINDINGS: Gallbladder: There is a shadowing gallstone measuring 1.2 cm. No gallbladder wall thickening. Negative sonographic Murphy sign. Common bile duct: Diameter: 0.6 cm, within normal limits.  Liver: No focal lesion identified. Parenchymal echogenicity appears increased. Portal vein is patent on color Doppler imaging with normal direction of blood flow towards the liver. IVC: No abnormality visualized. Pancreas: Visualized portion unremarkable. Spleen: Size and appearance within normal limits. Right Kidney: Length: 9.9 cm. Echogenicity within normal limits. There is a possible slightly hyperechoic mass in the mid right kidney measuring 1.9 cm. Left Kidney: Length: 10.6 cm. Echogenicity within normal limits. No mass or hydronephrosis visualized. Abdominal aorta: No aneurysm visualized. Other findings: None. IMPRESSION: 1.  Cholelithiasis without evidence of cholecystitis. 2. Increased liver parenchymal echogenicity most commonly seen with hepatic steatosis. 3. Possible cortical right renal mass measuring 1.9 cm. Consider dedicated renal protocol CT or MRI for further evaluation. Electronically Signed   By: Emmaline Kluver M.D.   On: 07/07/2020 10:13   CT RENAL ABD W/WO  Result Date: 07/21/2020 CLINICAL DATA:  Evaluate right kidney lesion. EXAM: CT ABDOMEN WITHOUT AND WITH CONTRAST TECHNIQUE: Multidetector CT imaging of the abdomen was performed following the standard protocol before and following the bolus administration of intravenous contrast. CONTRAST:  OMNIPAQUE IOHEXOL 300 MG/ML  SOLN COMPARISON:  CT AP 05/01/2020 and abdominal sonogram from 07/07/2020 FINDINGS: Lower chest: Calcified granuloma identified within the left lung base. Hepatobiliary: No focal liver abnormality. Gallstone noted within the neck of gallbladder measures 1 cm, image 35/11. No signs of gallbladder wall inflammation or biliary ductal dilatation. Pancreas: Unremarkable. No pancreatic ductal dilatation or surrounding inflammatory changes. Spleen: Normal in size without focal abnormality. Adrenals/Urinary Tract: Normal appearance of the adrenal glands. No nephrolithiasis or hydronephrosis identified bilaterally. No  kidney mass identified bilaterally. No CT correlate to the ultrasound finding. Small low-density structure within inferior pole of left kidney measures 7 mm. Although likely a simple cyst this is too small to reliably characterize. On the delayed images there is symmetric opacification of the collecting systems. No suspicious filling defect identified bilaterally. Stomach/Bowel: Small to moderate hiatal hernia. Stomach is nondistended. There is no bowel wall thickening, inflammation or distension identified. Vascular/Lymphatic: Aortic atherosclerosis. Similar appearance of supra renal abdominal aortic ectasia which on today's examination measures 2.8 cm AP and 2.2 cm transverse, image 18/6. No infrarenal abdominal aortic aneurysm. No adenopathy. Other: No free fluid or fluid collections.  No peritoneal nodule. Musculoskeletal: Lumbar degenerative disc disease. No acute or suspicious osseous findings. IMPRESSION: 1. No kidney mass identified bilaterally. No CT correlate to the ultrasound finding. 2. Gallstone. 3. Small to moderate hiatal hernia. 4. Aortic atherosclerosis. Aortic Atherosclerosis (ICD10-I70.0). Electronically Signed   By: Signa Kell M.D.   On: 07/21/2020 10:54      Impression/Plan:  Pleasant 79 y/o female with postprandial vomiting occurring now for one year. Symptoms have been progressive, occurring most days. Recently developed some ruq/right flank pain that is worse with certain positions and not necessarily related to meals. She has had extensive work up as outlined above.   Postprandial vomiting: ?related to moderate sized hiatal hernia. She really denies significant nausea. Zofran is not helping. Typical heartburn is well controlled. ?benefit from UGI series to better detail her hernia. To discuss with Dr. Jena Gaussourk.  Cholelithiasis: it is not clear that her gallstones are playing a role in her pain or vomiting. No acute cholecystitis, LFTs have been normal. She denies postprandial  component to her pain. Recommend low fat meals, small portions.   Gerd: well controlled. Continue pantoprazole.   Renal lesion, left: small and too small to characterize. No kidney mass identified, no CT correlate to the u/s finding. To discuss with radiology regarding any potential need for surveillance of tiny left renal lesion.

## 2020-07-24 ENCOUNTER — Encounter: Payer: Self-pay | Admitting: Gastroenterology

## 2020-07-24 DIAGNOSIS — R1011 Right upper quadrant pain: Secondary | ICD-10-CM | POA: Insufficient documentation

## 2020-07-24 DIAGNOSIS — N289 Disorder of kidney and ureter, unspecified: Secondary | ICD-10-CM | POA: Insufficient documentation

## 2020-07-24 DIAGNOSIS — R1110 Vomiting, unspecified: Secondary | ICD-10-CM | POA: Insufficient documentation

## 2020-07-24 DIAGNOSIS — K8020 Calculus of gallbladder without cholecystitis without obstruction: Secondary | ICD-10-CM | POA: Insufficient documentation

## 2020-07-25 ENCOUNTER — Telehealth: Payer: Self-pay | Admitting: Gastroenterology

## 2020-07-25 DIAGNOSIS — R112 Nausea with vomiting, unspecified: Secondary | ICD-10-CM

## 2020-07-25 DIAGNOSIS — K449 Diaphragmatic hernia without obstruction or gangrene: Secondary | ICD-10-CM

## 2020-07-25 NOTE — Telephone Encounter (Signed)
Spoke to patient. Having some burning in chest right at bedtime. Currently taking her pantoprazole in AM and at 5pm. Instructed her to take second dose 30 minutes before bed.   Discussed with Dr. Jena Gauss, he agrees with UGI series and patient is agreeable.   Please schedule UGI series for Dx: moderate sized hh and frequent vomiting.

## 2020-07-25 NOTE — Progress Notes (Signed)
Cc'ed to pcp °

## 2020-07-25 NOTE — Telephone Encounter (Signed)
UGI scheduled for 10/12 at 10:00am, arrival 9:45am, npo midnight  Called pt, LMOVM

## 2020-07-28 NOTE — Telephone Encounter (Signed)
Called pt and she is aware of appt details. She voiced understanding 

## 2020-07-29 ENCOUNTER — Other Ambulatory Visit: Payer: Self-pay

## 2020-07-29 ENCOUNTER — Ambulatory Visit (HOSPITAL_COMMUNITY)
Admission: RE | Admit: 2020-07-29 | Discharge: 2020-07-29 | Disposition: A | Discharge status: Home / Self Care | Payer: Medicare Other | Source: Ambulatory Visit | Attending: Gastroenterology | Admitting: Gastroenterology

## 2020-07-29 DIAGNOSIS — K449 Diaphragmatic hernia without obstruction or gangrene: Secondary | ICD-10-CM | POA: Insufficient documentation

## 2020-07-29 DIAGNOSIS — R112 Nausea with vomiting, unspecified: Secondary | ICD-10-CM | POA: Insufficient documentation

## 2020-08-07 ENCOUNTER — Other Ambulatory Visit: Payer: Self-pay | Admitting: *Deleted

## 2020-08-07 DIAGNOSIS — K449 Diaphragmatic hernia without obstruction or gangrene: Secondary | ICD-10-CM

## 2020-08-07 DIAGNOSIS — R112 Nausea with vomiting, unspecified: Secondary | ICD-10-CM

## 2020-08-08 ENCOUNTER — Telehealth: Payer: Self-pay | Admitting: Internal Medicine

## 2020-08-08 NOTE — Telephone Encounter (Signed)
[facility] called in regards to patient's referral to see Dr Lorin Picket. They were asking what was the referral for. [phone]

## 2020-08-11 ENCOUNTER — Telehealth: Payer: Self-pay | Admitting: *Deleted

## 2020-08-11 NOTE — Telephone Encounter (Signed)
Received a call from patient that she no longer wants a referral to [facility]. She reports she is passing gallstones now and is scheduled to see PCP tomorrow. Will inform leslie as an [place]

## 2020-08-11 NOTE — Telephone Encounter (Signed)
Spoke to [employer]. Patient called and wanted to cancel referral to Dr. Carolynn Sayers (for complicated hiatal hernia). She told Mindy, that she has "passed" some gallstones and has them in a bag and seeing her PCP tomorrow. Will await further need from patient.

## 2020-08-14 ENCOUNTER — Telehealth: Payer: Self-pay | Admitting: Internal Medicine

## 2020-08-14 NOTE — Telephone Encounter (Signed)
noted 

## 2020-08-14 NOTE — Telephone Encounter (Signed)
[phone] PLEASE CALL PATIENT ABOUT PAPERS THAT NEED TO BE SENT TO HER SURGEON   WE REFERRED HER TO ONE AND SHE CHANGED TO [facility]

## 2020-08-14 NOTE — Telephone Encounter (Signed)
Spoke with pt. Pt saw her PCP Dr. Sherril Croon and he referred her to [facility] to se Dr. Harriette Bouillon who has done pts previous surgeries on her colon. [facility] is asking for office procedure notes/results from our office. Pt has an apt with him on Wednesday.

## 2020-08-14 NOTE — Telephone Encounter (Signed)
April Hull please sent notes

## 2020-08-20 ENCOUNTER — Ambulatory Visit: Payer: Medicare Other | Admitting: Gastroenterology

## 2020-08-21 NOTE — Telephone Encounter (Signed)
Faxed records to [facility]  

## 2020-10-13 ENCOUNTER — Telehealth: Payer: Self-pay | Admitting: Cardiology

## 2020-10-13 NOTE — Telephone Encounter (Signed)
Patients second Moderna shot was in February. I encouraged her to get her booster.

## 2020-10-13 NOTE — Telephone Encounter (Signed)
Left message to return call 

## 2020-10-13 NOTE — Telephone Encounter (Signed)
New message     Please ask Dr Diona Browner if it is safe for her to get the covid booster

## 2020-10-28 NOTE — Progress Notes (Signed)
Reviewed CTs and U/S. Tiny lesion left kidney, statistically a small cyst. No change between CTs. No further work up needed.

## 2021-01-27 ENCOUNTER — Other Ambulatory Visit: Payer: Self-pay | Admitting: Emergency Medicine

## 2021-03-23 ENCOUNTER — Telehealth: Payer: Self-pay

## 2021-03-23 NOTE — Telephone Encounter (Signed)
Pt is c/o irregular heart beats that last between 2-3 hrs. Pt has felt like this for several weeks now. Pt is sure she is in Afib. Can hear SOB on the phone with pt. Please advise.

## 2021-03-23 NOTE — Telephone Encounter (Signed)
Pt says that her symptoms come and go. She has decided to come into the  office for an EKG and heart monitor consideration.

## 2021-03-23 NOTE — Telephone Encounter (Signed)
If she is in any distress and short of breath with her symptoms now, she should likely be seen in the ER to confirm rhythm and make sure that nothing else is going on.  If she is stable on the other hand and the symptoms are intermittent, could have her come by the [facility] office for an ECG and then consideration of whether she needs a heart monitor if she is not in atrial fibrillation.

## 2021-03-24 ENCOUNTER — Ambulatory Visit (INDEPENDENT_AMBULATORY_CARE_PROVIDER_SITE_OTHER): Payer: Medicare Other

## 2021-03-24 ENCOUNTER — Other Ambulatory Visit: Payer: Self-pay

## 2021-03-24 DIAGNOSIS — R0602 Shortness of breath: Secondary | ICD-10-CM | POA: Diagnosis not present

## 2021-03-24 NOTE — Progress Notes (Signed)
Pt presents today for a EKG per Dr. Diona Browner. Pt called [facility] office on 03/23/21 and was c/o irregular heartbeat and palpitations. Pt states that they are severe feeling. She had to call her home health company twice and they stayed with her and suggested that she contact her Cardiologist.

## 2021-03-26 ENCOUNTER — Telehealth: Payer: Self-pay | Admitting: Cardiology

## 2021-03-26 NOTE — Telephone Encounter (Signed)
EKG has not been interpreted yet. Will give pt results when it has been done.

## 2021-03-26 NOTE — Telephone Encounter (Signed)
New message    Patient calling for her EKG results

## 2021-04-02 ENCOUNTER — Telehealth: Payer: Self-pay | Admitting: *Deleted

## 2021-04-02 ENCOUNTER — Ambulatory Visit (INDEPENDENT_AMBULATORY_CARE_PROVIDER_SITE_OTHER): Payer: Medicare Other

## 2021-04-02 ENCOUNTER — Other Ambulatory Visit: Payer: Self-pay | Admitting: Cardiology

## 2021-04-02 ENCOUNTER — Telehealth: Payer: Self-pay | Admitting: Cardiology

## 2021-04-02 DIAGNOSIS — R002 Palpitations: Secondary | ICD-10-CM

## 2021-04-02 NOTE — Telephone Encounter (Signed)
-----   Message from Jonelle Sidle, MD sent at 03/31/2021  5:33 PM EDT ----- Please see recent phone notes.  ECG shows sinus rhythm.  Check with patient regarding frequency of recent palpitations.  If this is recurring once or twice weekly, would obtain a 7-day Zio patch.  If it is happening less frequently, then would obtain a 14-day Zio patch. ----- Message ----- From: Fredia Sorrow Sent: 03/31/2021   8:38 AM EDT To: Jonelle Sidle, MD

## 2021-04-02 NOTE — Telephone Encounter (Signed)
Pre-cert Verification for the following procedure     14 Day ZIO/[place]

## 2021-04-02 NOTE — Telephone Encounter (Signed)
Reports having palpitations about 7 times in a 6 week period. Agrees to wearing ZIO patch. Advised that a 14 day zio will be sent to her home address and if she is unable to place it on, to contact our office for assistance with monitor. Verbalized understanding of plan.

## 2021-04-08 ENCOUNTER — Other Ambulatory Visit: Payer: Self-pay

## 2021-04-08 ENCOUNTER — Ambulatory Visit: Payer: Medicare Other

## 2021-04-08 ENCOUNTER — Other Ambulatory Visit: Payer: Self-pay | Admitting: Emergency Medicine

## 2021-04-08 DIAGNOSIS — R002 Palpitations: Secondary | ICD-10-CM

## 2021-04-13 ENCOUNTER — Telehealth: Payer: Self-pay | Admitting: Internal Medicine

## 2021-04-13 NOTE — Telephone Encounter (Signed)
disregard

## 2021-05-01 ENCOUNTER — Telehealth: Payer: Self-pay | Admitting: *Deleted

## 2021-05-01 ENCOUNTER — Ambulatory Visit: Payer: Medicare Other | Admitting: Family Medicine

## 2021-05-01 ENCOUNTER — Encounter: Payer: Self-pay | Admitting: Family Medicine

## 2021-05-01 VITALS — BP 118/72 | HR 83 | Ht 63.0 in | Wt 145.2 lb

## 2021-05-01 DIAGNOSIS — R0602 Shortness of breath: Secondary | ICD-10-CM | POA: Diagnosis not present

## 2021-05-01 DIAGNOSIS — Z952 Presence of prosthetic heart valve: Secondary | ICD-10-CM | POA: Diagnosis not present

## 2021-05-01 DIAGNOSIS — R002 Palpitations: Secondary | ICD-10-CM | POA: Diagnosis not present

## 2021-05-01 DIAGNOSIS — I10 Essential (primary) hypertension: Secondary | ICD-10-CM | POA: Diagnosis not present

## 2021-05-01 NOTE — Telephone Encounter (Signed)
Lesle Chris, LPN  5/69/[id number] 01:65 AM EDT Back to Top     Patient notified at OV today by Nena Polio, NP.  Per AQ - patient stated that her palpitations were much better & declined to begin the Bisoprolol at thistime.  Copy to pcp.

## 2021-05-01 NOTE — Telephone Encounter (Signed)
-----   Message from Jonelle Sidle, MD sent at 04/29/2021  9:14 AM EDT ----- Results reviewed.  Cardiac monitor shows overall normal rhythm however she does have occasional PACs and episodes of PSVT as well as NSVT which she is likely sensing is palpitations.  Recommend starting bisoprolol 2.5 mg daily to see if this helps with her symptoms.  May be able to uptitrate dose depending on how she does.

## 2021-05-01 NOTE — Patient Instructions (Signed)
Medication Instructions:  Continue all current medications.   Labwork: none  Testing/Procedures: none  Follow-Up: 6 months   Any Other Special Instructions Will Be Listed Below (If Applicable).   If you need a refill on your cardiac medications before your next appointment, please call your pharmacy.  

## 2021-05-01 NOTE — Progress Notes (Signed)
Cardiology Office Note  Date: 05/01/2021   ID: April Hull, DOB Jun 18, 1941, MRN [id number]  PCP:  [name], MD  Cardiologist:  [name], MD Electrophysiologist:  None   Chief Complaint: Follow-up  History of Present Illness: April Hull is a 80 y.o. female with a history of COPD, HTN, hypothyroidism, HLD, osteoarthritis, osteoporosis, status post TAVR, aortic stenosis.  Palpitations  Telephone encounter on 03/23/2021 complaining of irregular heartbeats that lasted between 2 to 3 hours.  Patient stated this had been occurring over several weeks.  She states she was sure she was in atrial failure.  She was having some shortness of breath.  A cardiac monitor was ordered.  Patient had a recent cardiac monitor showing overall normal sinus rhythm with occasional disease and episodes of PSVT as well as NSVT.  Dr. Diona Browner recommended starting metoprolol 2.5 mg daily to see if this would help with her symptoms.  He stated may be able to uptitrate dosing depending on how she did.  She is here for follow-up for cardiac monitor.  She states her palpitations have significantly improved and she has not had any for a while.  She states she does not believe she needs to be on bisoprolol for now.  She denies any other issues except for some chronic dyspnea from her COPD.  She has an upcoming echocardiogram on August 10 for follow-up status post TAVR with history of LVH, diastolic dysfunction, mitral regurgitation, tricuspid regurgitation on previous echo in 2020.  She denies any orthostatic symptoms, CVA or TIA-like symptoms, PND, orthopnea.  Denies any claudication-like symptoms, bleeding like symptoms, DVT or PE-like symptoms, or lower extremity edema.  She states she has had some recent issues with vertigo.  She was taking meclizine but currently has scopolamine patch which is helping some.    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   26 mm Edwards Sapien 3 transcatheter heart valve placed via percutaneous left transfemoral approach   Sigmoid diverticulitis     Past Surgical History:  Procedure Laterality Date   ABDOMINAL HYSTERECTOMY  1972   APPENDECTOMY     COLONOSCOPY  10/15/2005   Rehman: Sigmoid colon diverticulosis, small polyp ablated via cold biopsy in the transverse colon   COLOSTOMY N/A 11/27/2014   Procedure: COLOSTOMY;  Surgeon: Harriette Bouillon, MD;  Location: [facility] OR;  Service: General;  Laterality: N/A;   COLOSTOMY TAKEDOWN N/A 04/09/2015   Procedure: LAPAROSCOPIC ASSISTED COLOSTOMY CLOSURE; RIGID SIGMOIDOSCOPY;  Surgeon: Harriette Bouillon, MD;  Location: [facility] OR;  Service: General;  Laterality: N/A;   ESOPHAGEAL DILATION  06/18/2020   Procedure: ESOPHAGEAL DILATION;  Surgeon: Corbin Ade, MD;  Location: [facility] SUITE;  Service: Endoscopy;;   ESOPHAGOGASTRODUODENOSCOPY  10/15/2005   Rehman: Normal exam, status post esophageal dilation for history of dysphagia.   ESOPHAGOGASTRODUODENOSCOPY N/A 06/18/2020   Rourk: Moderate Schatzki ring status post dilation, medium sized hiatal hernia, scattered erosions and streaky erythema of the gastric mucosa with benign biopsies.  No H. pylori.  hammer toes Bilateral    6   LAPAROSCOPIC SIGMOID COLECTOMY N/A 11/27/2014   Procedure: LAPAROSCOPIC ASSISTED SIGMOID COLECTOMY;  Surgeon: Harriette Bouillon, MD;  Location: [facility] OR;  Service: General;  Laterality: N/A;   LUMBAR DISC SURGERY     x 2   RIGHT/LEFT HEART CATH AND CORONARY ANGIOGRAPHY N/A 03/04/2017   Procedure: Right/Left Heart Cath and Coronary Angiography;  Surgeon: Kathleene Hazel, MD;  Location: [facility] INVASIVE CV LAB;  Service: Cardiovascular;  Laterality: N/A;   ROTATOR CUFF REPAIR Left 2009   TEE WITHOUT CARDIOVERSION N/A 04/05/2017   Procedure: TRANSESOPHAGEAL ECHOCARDIOGRAM (TEE);  Surgeon: Kathleene Hazel, MD;  Location: [facility] OR;  Service: Open Heart Surgery;  Laterality: N/A;   TRANSCATHETER AORTIC VALVE REPLACEMENT, TRANSFEMORAL N/A 04/05/2017   Procedure: TRANSCATHETER AORTIC VALVE REPLACEMENT, TRANSFEMORAL;  Surgeon: Kathleene Hazel, MD;  Location: [facility] OR;  Service: Open Heart Surgery;  Laterality: N/A;    Current Outpatient Medications  Medication Sig Dispense Refill   albuterol (PROVENTIL) (2.5 MG/3ML) 0.083% nebulizer solution Take 3 mLs (2.5 mg total) by nebulization every 6 (six) hours as needed for wheezing or shortness of breath. 75 mL 3   aspirin EC 81 MG tablet Take 1 tablet (81 mg total) by mouth daily. 90 tablet 3   atorvastatin (LIPITOR) 10 MG tablet Take 10 mg by mouth at bedtime.      Calcium Carbonate-Vitamin D (CALCIUM 500/D PO) Take 1 tablet by mouth daily.     EQ LORATADINE 10 MG tablet Take 1 tablet by mouth once daily (Patient taking differently: Take 10 mg by mouth daily.) 90 tablet 3   fluticasone (FLONASE) 50 MCG/ACT nasal spray Use 2 spray(s) in each nostril once daily 48 g 2   latanoprost (XALATAN) 0.005 % ophthalmic solution Place 1 drop into both eyes at bedtime.     levothyroxine (SYNTHROID, LEVOTHROID) 88 MCG tablet Take 88 mcg by mouth daily before breakfast.     Multiple Vitamin (MULTIVITAMIN WITH MINERALS) TABS tablet Take 1 tablet by mouth daily.      ondansetron (ZOFRAN-ODT) 4 MG disintegrating tablet Take 1 tablet (4 mg total) by mouth every 8 (eight) hours as needed for nausea or vomiting. 20 tablet 0   pantoprazole (PROTONIX) 40 MG tablet Take 1 tablet (40 mg total) by mouth 2 (two) times daily before a meal. 180 tablet 1   scopolamine (TRANSDERM-SCOP) 1 MG/3DAYS 1 patch every 3 (three) days.      TRELEGY ELLIPTA 100-62.5-25 MCG/INH AEPB Inhale 1 puff into the lungs daily. 60 each 5   valsartan (DIOVAN) 160 MG tablet Take 1 tablet by mouth once daily 30 tablet 0   VENTOLIN HFA 108 (90 Base) MCG/ACT inhaler INHALE 2 PUFFS BY MOUTH EVERY 4 HOURS AS NEEDED FOR WHEEZING AND FOR SHORTNESS OF BREATH (Patient taking differently: Inhale 2 puffs into the lungs every 4 (four) hours as needed for wheezing or shortness of breath.) 18 g 5   meclizine (ANTIVERT) 12.5 MG tablet Take 12.5 mg by mouth every 8 (eight) hours as needed for dizziness. (Patient not taking: Reported on 05/01/2021)     No current facility-administered medications for this visit.   Allergies:  Bactrim [sulfamethoxazole-trimethoprim], Plavix [clopidogrel bisulfate], and Codeine   Social History: The patient  reports that she quit smoking about 4 years ago. Her smoking use included cigarettes. She started smoking about 64 years ago. She has a 10.00 pack-year smoking history. She has never used smokeless tobacco. She reports that she does not  drink alcohol and does not use drugs.   Family History: The patient's family history includes Breast cancer in her sister; CAD in her brother; COPD in her father; Congestive Heart Failure in her mother; Emphysema in her father; Heart disease in her sister; Uterine cancer in her daughter.   ROS:  Please see the history of present illness. Otherwise, complete review of systems is positive for none.  All other systems are reviewed and negative.   Physical Exam: VS:  BP 118/72   Pulse 83   Ht 5\' 3"  (1.6 m)   Wt 145 lb 3.2 oz (65.9 kg)   SpO2 94%   BMI 25.72 kg/m , BMI Body mass index is 25.72 kg/m.  Wt Readings from Last 3 Encounters:  05/01/21 145 lb 3.2 oz (65.9 kg)  07/23/20 140 lb 9.6 oz (63.8 kg)  07/09/20 139 lb (63 kg)    General: Patient appears comfortable at rest. Neck: Supple, no elevated JVP or carotid bruits, no thyromegaly. Lungs: Clear to auscultation, nonlabored breathing  at rest. Cardiac: Regular rate and rhythm, no S3 or significant systolic murmur, no pericardial rub. Extremities: No pitting edema, distal pulses 2+. Skin: Warm and dry. Musculoskeletal: No kyphosis. Neuropsychiatric: Alert and oriented x3, affect grossly appropriate.  ECG:  EKG March 24, 2021.  Normal sinus rhythm with a rate of 72.  Cannot rule out anterior infarct, age undetermined.  Recent Labwork: 07/02/2020: ALT 21; AST 27 07/21/2020: Creatinine, Ser 0.60     Component Value Date/Time   TRIG 129 12/02/2014 0215    Other Studies Reviewed Today:   Cardiac monitor 04/02/2021. Study Highlights  ZIO XT reviewed.  13 days, 23 hours analyzed.  Predominant rhythm is sinus with heart rate ranging from 49 bpm up to 143 bpm and average heart rate 77 bpm.  There were occasional PACs representing 1.4% total beats and otherwise rare atrial couplets and triplets.  There were rare PVCs including couplets and triplets representing less than 1% total beats.  Several relatively brief episodes of PSVT were noted, the longest of which was 20 beats with average heart rate 105 beats per minutes.  There were also 5 episodes of NSVT versus aberrantly conducted SVT, the longest of which was 15 seconds.  No pauses.   Echocardiogram 05/17/2019  IMPRESSIONS   1. The left ventricle has hyperdynamic systolic function, with an  ejection fraction of >65%. The cavity size was normal. There is mild  concentric left ventricular hypertrophy. Left ventricular diastolic  Doppler parameters are consistent with impaired  relaxation. Elevated left ventricular end-diastolic pressure No evidence  of left ventricular regional wall motion abnormalities.   2. The mitral valve is degenerative. Mild thickening of the mitral valve  leaflet. Mild calcification of the mitral valve leaflet. There is moderate  mitral annular calcification present. Mild regurgitation.   3. A 26 Edwards Sapien bioprosthetic aortic valve (TAVR) valve  is present  in the aortic position.It is functioning normally.   4. The tricuspid valve is grossly normal. Mild regurgitation.   5. The right ventricle has normal systolic function. The cavity was  normal. There is no increase in right ventricular wall thickness.   6. The aorta is normal in size and structure.  Assessment and Plan:  1. Palpitations   2. SOB (shortness of breath)   3. S/P TAVR (transcatheter aortic valve replacement)   4. Essential hypertension    1. Palpitations Patient states palpitations are much better.  We discussed the results of the cardiac monitor.  Given the significant decrease in her palpitations she wants to defer starting bisoprolol for now.  2. SOB (shortness of breath) She has chronic SOB from COPD/history of smoking.  She denies any shortness of breath or dyspnea out of the usual.  3. S/P TAVR (transcatheter aortic valve replacement) She has an upcoming echo in August for follow-up on TAVR.  Continue aspirin 81 mg daily.  4. Essential hypertension Blood pressure is currently well controlled on medical therapy.  Continue valsartan 160 mg daily.  5.  Hyperlipidemia Continue atorvastatin 10 mg daily.  Medication Adjustments/Labs and Tests Ordered: Current medicines are reviewed at length with the patient today.  Concerns regarding medicines are outlined above.   Disposition: Follow-up with Dr. Diona BrownerMcDowell or APP 6 months  Signed, Rennis HardingAndrew Quinn, NP 05/01/2021 10:28 AM    [facility] [place] [address], [place], [place] [address] Phone: [phone]; Fax: [phone]

## 2021-05-04 ENCOUNTER — Encounter: Payer: Self-pay | Admitting: Internal Medicine

## 2021-05-04 ENCOUNTER — Ambulatory Visit: Payer: Medicare Other | Admitting: Internal Medicine

## 2021-05-04 ENCOUNTER — Other Ambulatory Visit: Payer: Self-pay

## 2021-05-04 DIAGNOSIS — J449 Chronic obstructive pulmonary disease, unspecified: Secondary | ICD-10-CM | POA: Diagnosis not present

## 2021-05-04 NOTE — Progress Notes (Signed)
April Hull, female    DOB: 12-16-1940, 80 y.o.   MRN: [id number]   Brief patient profile:  79 yowf quit smoking 03/2017 with  GOLD 2 criteria copd then and maint on trelegy by Dr April Hull referred to pulmonary clinic in [place]  07/09/2020 by Dr  April Hull re possible abdominal surgery.       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[employer] can do [employer] a bit harder  Cough: none  Sleep: bed blocks fine  SABA use: not lately needing  Any at all 02 : none   Rec Stay as active as you possibly can  Plan A = Automatic = Always=    Trelegy one click each am  Plan B = Backup (to supplement plan A, not to replace it) Only use your albuterol inhaler as a rescue medication  Plan C = Crisis (instead of Plan B but only if Plan B stops working) - only use your albuterol nebulizer if you first try Plan B and it fails to help > ok to use the nebulizer up to every 4 hours but if start needing it regularly call for immediate appointment Plan D = Doctor  - call right away if ABC not working  Make sure you check your oxygen saturations at highest level of activity  Please schedule a follow up visit in 12 months but call sooner if needed     05/04/2021  f/u ov/[place] office/April Hull re: GOLD 2 copd maint on Trelegy  Chief Complaint  Patient presents with   Follow-up    Breathing is unchanged since the last visit. She is using her albuterol inhaler once per wk on average and has not used neb.   Dyspnea:  pushing buggy around [place] Cough: none  Sleeping: bed blocks/ no resp cc  SABA use: rarely  02: none Covid status: x 2 vax    No obvious day to day or daytime variability or assoc excess/ purulent sputum or mucus plugs or hemoptysis or cp or chest tightness, subjective wheeze or overt sinus or  hb symptoms.   Sleeping  without nocturnal  or early am exacerbation  of respiratory  c/o's or need for noct saba. Also denies any obvious fluctuation of symptoms with weather or environmental changes or other aggravating or alleviating factors except as outlined above   No unusual exposure hx or h/o childhood pna/ asthma or knowledge of premature birth.  Current Allergies, Complete Past Medical History, Past Surgical History, Family History, and Social History were reviewed in [employer] record.  ROS  The following are not active complaints unless bolded Hoarseness, sore throat, dysphagia, dental problems, itching, sneezing,  nasal congestion or discharge of excess mucus or purulent secretions, ear ache,   fever, chills, sweats, unintended wt loss or wt gain, classically pleuritic or exertional cp,  orthopnea pnd or arm/hand swelling  or leg swelling, presyncope, palpitations, abdominal pain, anorexia, nausea, vomiting, diarrhea  or change in bowel habits or change in bladder habits, change in stools or change in urine, dysuria, hematuria,  rash, arthralgias, visual complaints, headache, numbness, weakness or ataxia or problems with walking or coordination,  change in mood or  memory. Vertigo        Current Meds  Medication Sig  albuterol (PROVENTIL) (2.5 MG/3ML) 0.083% nebulizer solution Take 3 mLs (2.5 mg total) by nebulization every 6 (six) hours as needed for wheezing or shortness of breath.   aspirin EC 81 MG tablet Take 1 tablet (81 mg total) by mouth daily.   atorvastatin (LIPITOR) 10 MG tablet Take 10 mg by mouth at bedtime.    Calcium Carbonate-Vitamin D (CALCIUM 500/D PO) Take 1 tablet by mouth daily.   EQ LORATADINE 10 MG tablet Take 1 tablet by mouth once daily (Patient taking differently: Take 10 mg by mouth daily.)   fluticasone (FLONASE) 50 MCG/ACT nasal spray Use 2 spray(s) in each nostril once daily   latanoprost (XALATAN) 0.005 % ophthalmic solution  Place 1 drop into both eyes at bedtime.   levothyroxine (SYNTHROID, LEVOTHROID) 88 MCG tablet Take 88 mcg by mouth daily before breakfast.   meclizine (ANTIVERT) 12.5 MG tablet Take 12.5 mg by mouth every 8 (eight) hours as needed for dizziness.   Multiple Vitamin (MULTIVITAMIN WITH MINERALS) TABS tablet Take 1 tablet by mouth daily.    ondansetron (ZOFRAN-ODT) 4 MG disintegrating tablet Take 1 tablet (4 mg total) by mouth every 8 (eight) hours as needed for nausea or vomiting.   pantoprazole (PROTONIX) 40 MG tablet Take 1 tablet (40 mg total) by mouth 2 (two) times daily before a meal.   scopolamine (TRANSDERM-SCOP) 1 MG/3DAYS 1 patch every 3 (three) days.   TRELEGY ELLIPTA 100-62.5-25 MCG/INH AEPB Inhale 1 puff into the lungs daily.   valsartan (DIOVAN) 160 MG tablet Take 1 tablet by mouth once daily   VENTOLIN HFA 108 (90 Base) MCG/ACT inhaler INHALE 2 PUFFS BY MOUTH EVERY 4 HOURS AS NEEDED FOR WHEEZING AND FOR SHORTNESS OF BREATH (Patient taking differently: Inhale 2 puffs into the lungs every 4 (four) hours as needed for wheezing or shortness of breath.)             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    NECK :  without JVD/Nodes/TM/ nl carotid upstrokes bilaterally   LUNGS: no acc muscle use,  Mod barrel  contour chest wall with bilateral  Distant bs s audible wheeze and  without cough on insp or exp maneuvers and mod  Hyperresonant  to  percussion bilaterally     CV:  RRR  no s3  1-2/6 sem   s  increase in  P2, and no edema   ABD:  soft and nontender with pos mid insp Hoover's  in the supine position. No bruits or organomegaly appreciated, bowel sounds nl  MS:     ext warm without deformities, calf tenderness, cyanosis or clubbing No obvious joint restrictions   SKIN: warm and dry without lesions    NEURO:  alert, approp, nl sensorium with  no motor or cerebellar deficits apparent.             Assessment

## 2021-05-04 NOTE — Assessment & Plan Note (Signed)
Quit smoking  02/2017 - PFT's  03/09/17  FEV1 1.13 (58 % ) ratio 0.59    with DLCO  11.80 (51%) corrects to 3.07 (65%)  for alv volume and FV curve mild concavity only   - 07/09/2020  After extensive coaching inhaler device,  effectiveness =    75% (short ti) -  07/09/2020   Walked RA  approx   600 ft  @ avg pace  stopped due to  End of study with sats 96%  At end   Group D in terms of symptom/risk and laba/lama/ICS  therefore appropriate rx at this point >>>  Continue trelegy and prn saba   re saba: I spent extra time with pt today reviewing appropriate use of albuterol for prn use on exertion with the following points: 1) saba is for relief of sob that does not improve by walking a slower pace or resting but rather if the pt does not improve after trying this first. 2) If the pt is convinced, as many are, that saba helps recover from activity faster then it's easy to tell if this is the case by re-challenging : ie stop, take the inhaler, then p 5 minutes try the exact same activity (intensity of workload) that just caused the symptoms and see if they are substantially diminished or not after saba 3) if there is an activity that reproducibly causes the symptoms, try the saba 15 min before the activity on alternate days   If in fact the saba really does help, then fine to continue to use it prn but advised may need to look closer at the maintenance regimen being used to achieve better control of airways disease with exertion.          Each maintenance medication was reviewed in detail including emphasizing most importantly the difference between maintenance and prns and under what circumstances the prns are to be triggered using an action plan format where appropriate.  Total time for H and P, chart review, counseling, reviewing elipta/hfa/neb device(s) and generating customized AVS unique to this office visit / same day charting = 23 min

## 2021-05-04 NOTE — Patient Instructions (Signed)
Plan A = Automatic = Always=    trelegy one click each am   Plan B = Backup (to supplement plan A, not to replace it) Only use your albuterol inhaler as a rescue medication to be used if you can't catch your breath by resting or doing a relaxed purse lip breathing pattern.  - The less you use it, the better it will work when you need it. - Ok to use the inhaler up to 2 puffs  every 4 hours if you must but call for appointment if use goes up over your usual need - Don't leave home without it !!  (think of it like the spare tire for your car)   Plan C = Crisis (instead of Plan B but only if Plan B stops working) - only use your albuterol nebulizer if you first try Plan B and it fails to help > ok to use the nebulizer up to every 4 hours but if start needing it regularly call for immediate appointment   Plan D = Doctor - call me if B and C not adequate   Please schedule a follow up visit in 12  months but call sooner if needed

## 2021-05-18 ENCOUNTER — Other Ambulatory Visit: Payer: Self-pay | Admitting: Emergency Medicine

## 2021-05-27 ENCOUNTER — Other Ambulatory Visit: Payer: Medicare Other

## 2021-05-27 ENCOUNTER — Ambulatory Visit (HOSPITAL_COMMUNITY)
Admission: RE | Admit: 2021-05-27 | Discharge: 2021-05-27 | Disposition: A | Discharge status: Home / Self Care | Payer: Medicare Other | Source: Ambulatory Visit | Attending: Cardiology | Admitting: Cardiology

## 2021-05-27 ENCOUNTER — Other Ambulatory Visit: Payer: Self-pay

## 2021-05-27 DIAGNOSIS — Z952 Presence of prosthetic heart valve: Secondary | ICD-10-CM

## 2021-05-27 LAB — ECHOCARDIOGRAM COMPLETE
AR max vel: 2.16 cm2
AV Area VTI: 2.06 cm2
AV Area mean vel: 2.16 cm2
AV Mean grad: 6 mmHg
AV Peak grad: 10 mmHg
Ao pk vel: 1.58 m/s
Area-P 1/2: 2.25 cm2
S' Lateral: 2.8 cm

## 2021-05-27 NOTE — Progress Notes (Signed)
*  PRELIMINARY RESULTS* Echocardiogram 2D Echocardiogram has been performed.  Stacey Drain 05/27/2021, 2:54 PM

## 2021-06-01 ENCOUNTER — Telehealth: Payer: Self-pay | Admitting: *Deleted

## 2021-06-01 NOTE — Telephone Encounter (Signed)
Patient informed. Copy sent to PCP °

## 2021-06-01 NOTE — Telephone Encounter (Signed)
-----   Message from Ellsworth Lennox, [place] sent at 05/27/2021  5:13 PM EDT ----- Covering for Dr. Diona Browner - Please let the patient know her echocardiogram shows normal pumping function of the heart with a preserved ejection fraction of 60 to 65%.  She does have mildly abnormal relaxation of the heart which is common to see with hypertension and with age, therefore good blood pressure control is essential. Her prosthetic aortic valve is working well with no significant change when compared to prior imaging from 05/2020.  Please forward a copy of results to [name], MD.

## 2021-06-13 ENCOUNTER — Other Ambulatory Visit: Payer: Self-pay | Admitting: Nurse Practitioner

## 2021-06-13 DIAGNOSIS — K219 Gastro-esophageal reflux disease without esophagitis: Secondary | ICD-10-CM

## 2021-06-16 ENCOUNTER — Telehealth: Payer: Self-pay

## 2021-06-16 NOTE — Telephone Encounter (Signed)
error 

## 2021-06-24 ENCOUNTER — Encounter: Payer: Self-pay | Admitting: Cardiology

## 2021-06-24 ENCOUNTER — Ambulatory Visit: Payer: Medicare Other | Admitting: Cardiology

## 2021-06-24 VITALS — BP 132/84 | HR 80 | Ht 63.0 in | Wt 144.2 lb

## 2021-06-24 DIAGNOSIS — I471 Supraventricular tachycardia: Secondary | ICD-10-CM | POA: Diagnosis not present

## 2021-06-24 DIAGNOSIS — R002 Palpitations: Secondary | ICD-10-CM | POA: Diagnosis not present

## 2021-06-24 DIAGNOSIS — Z952 Presence of prosthetic heart valve: Secondary | ICD-10-CM

## 2021-06-24 NOTE — Progress Notes (Signed)
Cardiology Office Note  Date: 06/24/2021   ID: April Hull, DOB July 02, 1941, MRN [id number]  PCP:  [name], MD  Cardiologist:  [name], MD Electrophysiologist:  None   Chief Complaint  Patient presents with   Cardiac follow-up    History of Present Illness: April Hull is an 80 y.o. female last seen in July by Mr. April Hews NP, she presents for an early follow-up visit.  She describes no major change in status, specifically no progressive sense of palpitations.  She has NYHA class II-III dyspnea at baseline which is also unchanged.  She continues on stable pulmonary regimen and follow-up with Dr. Sherene Sires.  Recent cardiac monitor and echocardiogram results are outlined below.  We discussed these results today.  She is status post TAVR with normal functioning prosthesis, LVEF remains normal at 60 to 65%.  We have discussed the possibility of addition of low-dose beta-blocker such as bisoprolol if palpitations worsen, but for now she is comfortable with observation.  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   26 mm Edwards Sapien 3 transcatheter heart valve placed via percutaneous left transfemoral approach   Sigmoid diverticulitis     Past Surgical History:  Procedure Laterality Date   ABDOMINAL HYSTERECTOMY  1972   APPENDECTOMY     COLONOSCOPY  10/15/2005   Rehman: Sigmoid colon diverticulosis, small polyp ablated via cold biopsy in the transverse colon   COLOSTOMY N/A 11/27/2014   Procedure: COLOSTOMY;  Surgeon: Harriette Bouillon, MD;  Location: [facility] OR;  Service: General;   Laterality: N/A;   COLOSTOMY TAKEDOWN N/A 04/09/2015   Procedure: LAPAROSCOPIC ASSISTED COLOSTOMY CLOSURE; RIGID SIGMOIDOSCOPY;  Surgeon: Harriette Bouillon, MD;  Location: [facility] OR;  Service: General;  Laterality: N/A;   ESOPHAGEAL DILATION  06/18/2020   Procedure: ESOPHAGEAL DILATION;  Surgeon: Corbin Ade, MD;  [facility];  Service: Endoscopy;;   ESOPHAGOGASTRODUODENOSCOPY  10/15/2005   Rehman: Normal exam, status post esophageal dilation for history of dysphagia.   ESOPHAGOGASTRODUODENOSCOPY N/A 06/18/2020   Rourk: Moderate Schatzki ring status post dilation, medium sized hiatal hernia, scattered erosions and streaky erythema of the gastric mucosa with benign biopsies.  No H. pylori.   hammer toes Bilateral    6   LAPAROSCOPIC SIGMOID COLECTOMY N/A 11/27/2014   Procedure: LAPAROSCOPIC ASSISTED SIGMOID COLECTOMY;  Surgeon: Harriette Bouillon, MD;  Location: [facility] OR;  Service: General;  Laterality: N/A;   LUMBAR DISC SURGERY     x 2   RIGHT/LEFT HEART CATH AND CORONARY ANGIOGRAPHY N/A 03/04/2017   Procedure: Right/Left Heart Cath and Coronary Angiography;  Surgeon: Kathleene Hazel, MD;  Location: [facility] INV[facility] LAB;  Service: Cardiovascular;  Laterality: N/A;   ROTATOR CUFF REPAIR Left 2009   TEE WITHOUT CARDIOVERSION N/A 04/05/2017   Procedure: TRANSESOPHAGEAL ECHOCARDIOGRAM (TEE);  Surgeon: Kathleene Hazel, MD;  Location: [facility] OR;  Service: Open Heart Surgery;  Laterality: N/A;   TRANSCATHETER AORTIC VALVE REPLACEMENT, TRANSFEMORAL N/A 04/05/2017   Procedure: TRANSCATHETER AORTIC VALVE REPLACEMENT, TRANSFEMORAL;  Surgeon:  Kathleene Hazel, MD;  Location: [facility] OR;  Service: Open Heart Surgery;  Laterality: N/A;    Current Outpatient Medications  Medication Sig Dispense Refill   albuterol (PROVENTIL) (2.5 MG/3ML) 0.083% nebulizer solution Take 3 mLs (2.5 mg total) by nebulization every 6 (six) hours as needed for wheezing or shortness of breath. 75 mL 3   aspirin EC 81 MG  tablet Take 1 tablet (81 mg total) by mouth daily. 90 tablet 3   atorvastatin (LIPITOR) 10 MG tablet Take 10 mg by mouth at bedtime.      Calcium Carbonate-Vitamin D (CALCIUM 500/D PO) Take 1 tablet by mouth daily.     EQ ALLERGY RELIEF 10 MG tablet Take 1 tablet by mouth once daily 90 tablet 0   fluticasone (FLONASE) 50 MCG/ACT nasal spray Use 2 spray(s) in each nostril once daily 48 g 2   furosemide (LASIX) 20 MG tablet Take 20 mg by mouth daily.     latanoprost (XALATAN) 0.005 % ophthalmic solution Place 1 drop into both eyes at bedtime.     levothyroxine (SYNTHROID, LEVOTHROID) 88 MCG tablet Take 88 mcg by mouth daily before breakfast.     meclizine (ANTIVERT) 12.5 MG tablet Take 12.5 mg by mouth every 8 (eight) hours as needed for dizziness.     Multiple Vitamin (MULTIVITAMIN WITH MINERALS) TABS tablet Take 1 tablet by mouth daily.      ondansetron (ZOFRAN-ODT) 4 MG disintegrating tablet Take 1 tablet (4 mg total) by mouth every 8 (eight) hours as needed for nausea or vomiting. 20 tablet 0   pantoprazole (PROTONIX) 40 MG tablet TAKE 1 TABLET BY MOUTH TWICE DAILY BEFORE  A  MEAL 180 tablet 2   TRELEGY ELLIPTA 100-62.5-25 MCG/INH AEPB Inhale 1 puff into the lungs daily. 60 each 5   valsartan (DIOVAN) 160 MG tablet Take 1 tablet by mouth once daily 30 tablet 0   VENTOLIN HFA 108 (90 Base) MCG/ACT inhaler INHALE 2 PUFFS BY MOUTH EVERY 4 HOURS AS NEEDED FOR WHEEZING AND FOR SHORTNESS OF BREATH (Patient taking differently: Inhale 2 puffs into the lungs every 4 (four) hours as needed for wheezing or shortness of breath.) 18 g 5   scopolamine (TRANSDERM-SCOP) 1 MG/3DAYS 1 patch every 3 (three) days. (Patient not taking: Reported on 06/24/2021)     No current facility-administered medications for this visit.   Allergies:  Bactrim [sulfamethoxazole-trimethoprim], Plavix [clopidogrel bisulfate], and Codeine   ROS: No orthopnea or PND.  Physical Exam: VS:  BP 132/84   Pulse 80   Ht 5\' 3"  (1.6 m)    Wt 144 lb 3.2 oz (65.4 kg)   SpO2 96%   BMI 25.54 kg/m , BMI Body mass index is 25.54 kg/m.  Wt Readings from Last 3 Encounters:  06/24/21 144 lb 3.2 oz (65.4 kg)  05/04/21 145 lb (65.8 kg)  05/01/21 145 lb 3.2 oz (65.9 kg)    General: Patient appears comfortable at rest. HEENT: Conjunctiva and lids normal, wearing a mask. Neck: Supple, no elevated JVP or carotid bruits, no thyromegaly. Lungs: Clear to auscultation, nonlabored breathing at rest. Cardiac: Regular rate and rhythm, no S3, 2/6 systolic murmur, no pericardial rub. Extremities: No pitting edema.  ECG:  An ECG dated 03/24/2021 was personally reviewed today and demonstrated:  Sinus rhythm with low voltage and decreased R wave progression.  Recent Labwork: 07/02/2020: ALT 21; AST 27 07/21/2020: Creatinine, Ser 0.60     Component Value Date/Time   TRIG 129 12/02/2014 0215  Other Studies Reviewed Today:  Cardiac monitor July 2022: ZIO XT reviewed.  13 days, 23 hours analyzed.  Predominant rhythm is sinus with heart rate ranging from 49 bpm up to 143 bpm and average heart rate 77 bpm.  There were occasional PACs representing 1.4% total beats and otherwise rare atrial couplets and triplets.  There were rare PVCs including couplets and triplets representing less than 1% total beats.  Several relatively brief episodes of PSVT were noted, the longest of which was 20 beats with average heart rate 105 beats per minutes.  There were also 5 episodes of NSVT versus aberrantly conducted SVT, the longest of which was 15 seconds.  No pauses.  Echocardiogram 05/27/2021:  1. Left ventricular ejection fraction, by estimation, is 60 to 65%. The  left ventricle has normal function. The left ventricle has no regional  wall motion abnormalities. There is mild left ventricular hypertrophy.  Left ventricular diastolic parameters  are consistent with Grade I diastolic dysfunction (impaired relaxation).  Elevated left atrial pressure.   2. Right  ventricular systolic function is normal. The right ventricular  size is normal.   3. Trivial mitral valve regurgitation.   4. 26 mm Edwards Sapien prosthetic valve present (04/05/2017). Opens well.  Peak and mean gradients through the valve are10 and 6 mm Hg respectively  No signficant change from report of 05/21/20.. The aortic valve has been  repaired/replaced. Aortic valve  regurgitation is not visualized.   5. The inferior vena cava is normal in size with greater than 50%  respiratory variability, suggesting right atrial pressure of 3 mmHg.   Assessment and Plan:  1.  Aortic stenosis status post TAVR.  Prosthetic function normal by recent follow-up echocardiogram with mean gradient 6 mmHg and no paravalvular leak.  Continue observation.  2.  History of palpitations, no progressive symptoms.  Cardiac monitor from July did show some brief episodes of PSVT.  For now we will continue with observation.  Could consider addition of low-dose bisoprolol if symptoms worsen.  3.  COPD, continues to follow with Dr. Sherene Sires.  Medication Adjustments/Labs and Tests Ordered: Current medicines are reviewed at length with the patient today.  Concerns regarding medicines are outlined above.   Tests Ordered: No orders of the defined types were placed in this encounter.   Medication Changes: No orders of the defined types were placed in this encounter.   Disposition:  Follow up  6 months.  Signed, Jonelle Sidle, MD, [facility] 06/24/2021 11:34 AM    [facility] at [facility] [address] [place], [place], [place] [address] Phone: [phone]; Fax: [phone]

## 2021-06-24 NOTE — Patient Instructions (Addendum)

## 2021-07-28 ENCOUNTER — Ambulatory Visit: Payer: Medicare Other | Admitting: Cardiology

## 2021-08-25 ENCOUNTER — Other Ambulatory Visit: Payer: Self-pay | Admitting: Internal Medicine

## 2021-10-06 ENCOUNTER — Other Ambulatory Visit: Payer: Self-pay | Admitting: Emergency Medicine

## 2021-11-03 ENCOUNTER — Ambulatory Visit: Payer: Medicare Other | Admitting: Cardiology

## 2021-12-29 NOTE — Progress Notes (Signed)
? ? ?Cardiology Office Note ? ?Date: 12/30/2021  ? ?ID: April Hull, DOB 22-Jan-1941, MRN [id number] ? ?PCP:  Glenda Chroman, MD  ?Cardiologist:  Rozann Lesches, MD ?Electrophysiologist:  None  ? ?Chief Complaint  ?Patient presents with  ? Cardiac follow-up  ? ? ?History of Present Illness: ?April Hull is an 81 y.o. female last seen in September 2022.  She is here for a routine visit.  Reports no angina symptoms, stable palpitations, no dizziness or syncope.  She remains functional with ADLs. ? ?I reviewed her medications.  She reports compliance with current medical regimen, no obvious intolerances.  She will be seeing her PCP this summer for follow-up lab work. ? ?Her last echocardiogram was in August 2022 for follow-up of TAVR, she will have a repeat study later this summer. ? ?Past Medical History:  ?Diagnosis Date  ? Anxiety   ? Aortic stenosis   ? Colon polyp   ? COPD (chronic obstructive pulmonary disease) ([place])   ? Diarrhea   ? Essential hypertension   ? GERD (gastroesophageal reflux disease)   ? History of blood transfusion   ? History of pneumonia   ? Hypothyroidism   ? Mixed hyperlipidemia   ? OA (osteoarthritis of the spine)   ? Osteoporosis   ? 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 26 mm Edwards Sapien 3 transcatheter heart valve placed via percutaneous left transfemoral approach  ? Sigmoid diverticulitis   ? ? ?Past Surgical History:  ?Procedure Laterality Date  ? ABDOMINAL HYSTERECTOMY  1972  ? APPENDECTOMY    ? COLONOSCOPY  10/15/2005  ? Rehman: Sigmoid colon diverticulosis, small polyp ablated via cold biopsy in the transverse colon  ? COLOSTOMY N/A 11/27/2014  ? Procedure: COLOSTOMY;  Surgeon: Erroll Luna, MD;  Location: [place];  Service: General;  Laterality: N/A;  ? COLOSTOMY TAKEDOWN N/A 04/09/2015  ? Procedure: LAPAROSCOPIC ASSISTED COLOSTOMY CLOSURE; RIGID SIGMOIDOSCOPY;  Surgeon: Erroll Luna, MD;  Location: [place];  Service: General;  Laterality: N/A;  ?  ESOPHAGEAL DILATION  06/18/2020  ? Procedure: ESOPHAGEAL DILATION;  Surgeon: Daneil Dolin, MD;  [facility];  Service: Endoscopy;;  ? ESOPHAGOGASTRODUODENOSCOPY  10/15/2005  ? Rehman: Normal exam, status post esophageal dilation for history of dysphagia.  ? ESOPHAGOGASTRODUODENOSCOPY N/A 06/18/2020  ? Rourk: Moderate Schatzki ring status post dilation, medium sized hiatal hernia, scattered erosions and streaky erythema of the gastric mucosa with benign biopsies.  No H. pylori.  ? hammer toes Bilateral   ? 6  ? LAPAROSCOPIC SIGMOID COLECTOMY N/A 11/27/2014  ? Procedure: LAPAROSCOPIC ASSISTED SIGMOID COLECTOMY;  Surgeon: Erroll Luna, MD;  Location: [place];  Service: General;  Laterality: N/A;  ? LUMBAR [place]    ? x 2  ? RIGHT/LEFT HEART CATH AND CORONARY ANGIOGRAPHY N/A 03/04/2017  ? Procedure: Right/Left Heart Cath and Coronary Angiography;  Surgeon: Burnell Blanks, MD;  Location: [place] CV LAB;  Service: Cardiovascular;  Laterality: N/A;  ? ROTATOR CUFF REPAIR Left 2009  ? TEE WITHOUT CARDIOVERSION N/A 04/05/2017  ? Procedure: TRANSESOPHAGEAL ECHOCARDIOGRAM (TEE);  Surgeon: Burnell Blanks, MD;  Location: [place];  Service: Open Heart Surgery;  Laterality: N/A;  ? TRANSCATHETER AORTIC VALVE REPLACEMENT, TRANSFEMORAL N/A 04/05/2017  ? Procedure: TRANSCATHETER AORTIC VALVE REPLACEMENT, TRANSFEMORAL;  Surgeon: Burnell Blanks, MD;  Location: [place];  Service: Open Heart Surgery;  Laterality: N/A;  ? ? ?Current Outpatient Medications  ?Medication Sig Dispense Refill  ? albuterol (PROVENTIL) (2.5  MG/3ML) 0.083% nebulizer solution Take 3 mLs (2.5 mg total) by nebulization every 6 (six) hours as needed for wheezing or shortness of breath. 75 mL 3  ? aspirin EC 81 MG tablet Take 1 tablet (81 mg total) by mouth daily. 90 tablet 3  ? atorvastatin (LIPITOR) 10 MG tablet Take 10 mg by mouth at bedtime.     ? Calcium Carbonate-Vitamin D (CALCIUM 500/D PO) Take 1 tablet by mouth daily.     ? fluticasone (FLONASE) 50 MCG/ACT nasal spray Use 2 spray(s) in each nostril once daily 48 g 2  ? furosemide (LASIX) 20 MG tablet Take 20 mg by mouth daily.    ? latanoprost (XALATAN) 0.005 % ophthalmic solution Place 1 drop into both eyes at bedtime.    ? levothyroxine (SYNTHROID, LEVOTHROID) 88 MCG tablet Take 88 mcg by mouth daily before breakfast.    ? loratadine (EQ ALLERGY RELIEF) 10 MG tablet Take 1 tablet by mouth once daily 90 tablet 3  ? meclizine (ANTIVERT) 12.5 MG tablet Take 12.5 mg by mouth every 8 (eight) hours as needed for dizziness.    ? Multiple Vitamin (MULTIVITAMIN WITH MINERALS) TABS tablet Take 1 tablet by mouth daily.     ? ondansetron (ZOFRAN-ODT) 4 MG disintegrating tablet Take 1 tablet (4 mg total) by mouth every 8 (eight) hours as needed for nausea or vomiting. 20 tablet 0  ? pantoprazole (PROTONIX) 40 MG tablet TAKE 1 TABLET BY MOUTH TWICE DAILY BEFORE  A  MEAL 180 tablet 2  ? TRELEGY ELLIPTA 100-62.5-25 MCG/INH AEPB Inhale 1 puff into the lungs daily. 60 each 5  ? valsartan (DIOVAN) 160 MG tablet Take 1 tablet by mouth once daily 30 tablet 0  ? VENTOLIN HFA 108 (90 Base) MCG/ACT inhaler INHALE 2 PUFFS BY MOUTH EVERY 4 HOURS AS NEEDED FOR WHEEZING AND FOR SHORTNESS OF BREATH (Patient taking differently: Inhale 2 puffs into the lungs every 4 (four) hours as needed for wheezing or shortness of breath.) 18 g 5  ? ?No current facility-administered medications for this visit.  ? ?Allergies:  Bactrim [sulfamethoxazole-trimethoprim], Plavix [clopidogrel bisulfate], and Codeine  ? ?ROS: No orthopnea or PND. ? ?Physical Exam: ?VS:  BP 112/72   Pulse 85   Ht 5\' 3"  (1.6 m)   Wt 134 lb 9.6 oz (61.1 kg)   SpO2 96%   BMI 23.84 kg/m? , BMI Body mass index is 23.84 kg/m?. ? ?Wt Readings from Last 3 Encounters:  ?12/30/21 134 lb 9.6 oz (61.1 kg)  ?06/24/21 144 lb 3.2 oz (65.4 kg)  ?05/04/21 145 lb (65.8 kg)  ?  ?General: Patient appears comfortable at rest. ?HEENT: Conjunctiva and lids normal,  wearing a mask. ?Neck: Supple, no elevated JVP or carotid bruits, no thyromegaly. ?Lungs: Clear to auscultation, nonlabored breathing at rest. ?Cardiac: Regular rate and rhythm, no S3, [id number] systolic murmur, no pericardial rub. ?Extremities: No pitting edema. ? ?ECG:  An ECG dated 03/24/2021 was personally reviewed today and demonstrated:  Sinus rhythm with low voltage and decreased R wave progression. ? ?Recent Labwork: ? ?No interval lab work for review today. ? ?Other Studies Reviewed Today: ? ?Cardiac monitor July 2022: ?ZIO XT reviewed.  13 days, 23 hours analyzed.  Predominant rhythm is sinus with heart rate ranging from 49 bpm up to 143 bpm and average heart rate 77 bpm.  There were occasional PACs representing 1.4% total beats and otherwise rare atrial couplets and triplets.  There were rare PVCs including couplets and triplets representing less  than 1% total beats.  Several relatively brief episodes of PSVT were noted, the longest of which was 20 beats with average heart rate 105 beats per minutes.  There were also 5 episodes of NSVT versus aberrantly conducted SVT, the longest of which was 15 seconds.  No pauses. ? ?Echocardiogram 05/27/2021: ? 1. Left ventricular ejection fraction, by estimation, is 60 to 65%. The  ?left ventricle has normal function. The left ventricle has no regional  ?wall motion abnormalities. There is mild left ventricular hypertrophy.  ?Left ventricular diastolic parameters  ?are consistent with Grade I diastolic dysfunction (impaired relaxation).  ?Elevated left atrial pressure.  ? 2. Right ventricular systolic function is normal. The right ventricular  ?size is normal.  ? 3. Trivial mitral valve regurgitation.  ? 4. 26 mm Edwards Sapien prosthetic valve present (04/05/2017). Opens well.  ?Peak and mean gradients through the valve are10 and 6 mm Hg respectively  ?No signficant change from report of 05/21/20.. The aortic valve has been  ?repaired/replaced. Aortic valve  ?regurgitation is  not visualized.  ? 5. The inferior vena cava is normal in size with greater than 50%  ?respiratory variability, suggesting right atrial pressure of 3 mmHg.  ? ?Assessment and Plan: ? ?1.  Aortic stenosis status po

## 2021-12-30 ENCOUNTER — Ambulatory Visit: Payer: Medicare Other | Admitting: Cardiology

## 2021-12-30 ENCOUNTER — Encounter: Payer: Self-pay | Admitting: Cardiology

## 2021-12-30 VITALS — BP 112/72 | HR 85 | Ht 63.0 in | Wt 134.6 lb

## 2021-12-30 DIAGNOSIS — Z952 Presence of prosthetic heart valve: Secondary | ICD-10-CM

## 2021-12-30 DIAGNOSIS — R002 Palpitations: Secondary | ICD-10-CM | POA: Diagnosis not present

## 2021-12-30 NOTE — Patient Instructions (Signed)

## 2022-01-10 ENCOUNTER — Other Ambulatory Visit: Payer: Self-pay | Admitting: Emergency Medicine

## 2022-03-24 ENCOUNTER — Other Ambulatory Visit: Payer: Self-pay | Admitting: Cardiology

## 2022-03-24 DIAGNOSIS — Z952 Presence of prosthetic heart valve: Secondary | ICD-10-CM

## 2022-03-24 DIAGNOSIS — I359 Nonrheumatic aortic valve disorder, unspecified: Secondary | ICD-10-CM

## 2022-05-05 ENCOUNTER — Ambulatory Visit (INDEPENDENT_AMBULATORY_CARE_PROVIDER_SITE_OTHER): Payer: Medicare Other | Admitting: Internal Medicine

## 2022-05-05 ENCOUNTER — Encounter: Payer: Self-pay | Admitting: Internal Medicine

## 2022-05-05 DIAGNOSIS — J449 Chronic obstructive pulmonary disease, unspecified: Secondary | ICD-10-CM | POA: Diagnosis not present

## 2022-05-05 DIAGNOSIS — R058 Other specified cough: Secondary | ICD-10-CM | POA: Diagnosis not present

## 2022-05-05 DIAGNOSIS — J9611 Chronic respiratory failure with hypoxia: Secondary | ICD-10-CM

## 2022-05-05 MED ORDER — BREZTRI AEROSPHERE 160-9-4.8 MCG/ACT IN AERO: INHALATION_SPRAY | RESPIRATORY_TRACT | 11 refills | Status: AC

## 2022-05-05 NOTE — Patient Instructions (Addendum)
Stop trelegy and start  Breztri Take 1-2 puffs first thing in am and then another 1-2 puffs about 12 hours later.   Work on inhaler technique:  relax and gently blow all the way out then take a nice smooth full deep breath back in, triggering the inhaler at same time you start breathing in.  Hold for up to 5 seconds if you can. Blow out thru nose. Rinse and gargle with water when done.  If mouth or throat bother you at all,  try brushing teeth/gums/tongue with arm and hammer toothpaste/ make a slurry and gargle and spit out.   Make sure you check your oxygen saturation  AT  your highest level of activity (not after you stop)   to be sure it stays over 90% and adjust  02 flow upward to maintain this level if needed but remember to turn it back to previous settings when you stop (to conserve your supply).   GERD (REFLUX)  is an extremely common cause of respiratory symptoms just like yours , many times with no obvious heartburn at all.    It can be treated with medication, but also with lifestyle changes including elevation of the head of your bed (ideally with 6 -8inch blocks under the headboard of your bed),  Smoking cessation, avoidance of late meals, excessive alcohol, and avoid fatty foods, chocolate, peppermint, colas, red wine, and acidic juices such as orange juice.  NO MINT OR MENTHOL PRODUCTS SO NO COUGH DROPS  USE SUGARLESS CANDY INSTEAD (Jolley ranchers or Stover's or Life Savers) or even ice chips will also do - the key is to swallow to prevent all throat clearing. NO OIL BASED VITAMINS - use powdered substitutes.  Avoid fish oil when coughing.      Please schedule a follow up office visit in 6 weeks, call sooner if needed - bring inhalers with you

## 2022-05-05 NOTE — Progress Notes (Signed)
April Hull, female    DOB: [birth year], 81 y.o.   MRN: [id number]   Brief patient profile:  79 yowf quit smoking 03/2017 with  GOLD 2 criteria copd then and maint on trelegy by Dr Delton Coombes referred to pulmonary clinic in [place]  07/09/2020 by Dr  Sherril Croon re possible abdominal surgery.       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 [employer], [employer] a bit harder  Cough: none  Sleep: bed blocks fine  SABA use: not lately needing  Any at all 02 : none   Rec Stay as active as you possibly can  Plan A = Automatic = Always=    Trelegy one click each am  Plan B = Backup (to supplement plan A, not to replace it) Only use your albuterol inhaler as a rescue medication  Plan C = Crisis (instead of Plan B but only if Plan B stops working) - only use your albuterol nebulizer if you first try Plan B and it fails to help > ok to use the nebulizer up to every 4 hours but if start needing it regularly call for immediate appointment Plan D = Doctor  - call right away if ABC not working  Make sure you check your oxygen saturations at highest level of activity  Please schedule a follow up visit in 12 months but call sooner if needed     05/04/2021  f/u ov/[place] office/Salaya Holtrop re: GOLD 2 copd maint on Trelegy  Chief Complaint  Patient presents with   Follow-up    Breathing is unchanged since the last visit. She is using her albuterol inhaler once per wk on average and has not used neb.   Dyspnea:  pushing buggy around wm Cough: none  Sleeping: bed blocks/ no resp cc  SABA use: rarely  02: none Covid status: x 2 vax  Rec Plan A = Automatic = Always=    trelegy one click each am  Plan B = Backup (to supplement plan A, not to replace it) Only use your albuterol inhaler as a rescue  medication Plan C = Crisis (instead of Plan B but only if Plan B stops working) - only use your albuterol nebulizer if you first try Plan B and it fails to help Plan D = Doctor - call me if B and C not adequate     05/05/2022  f/u ov/[place] office/Angellica Maddison re: GOLD 2 copd  maint on trelegy with worsening cough  / in SNF p lap chole  04/23/22  Chief Complaint  Patient presents with   Follow-up    Patient was in hospital recently. Breathing is about the same since last visit. Has been put on O2 since hospital visit and is at [facility] nursing facility with rehab. Not ambulating   Dyspnea:  4th day of SNF on 02 2lpm  Cough: throat irritation/ choking on dpi but no excess mucus  Sleeping: slt elevation hosp bed  SABA use: rarely 02: 2lpm        No obvious day to day or daytime variability or assoc excess/ purulent sputum or mucus plugs or hemoptysis or cp or chest tightness, subjective wheeze or overt sinus or hb symptoms.   Sleeping  without nocturnal  or early am exacerbation  of respiratory  c/o's or need for noct saba. Also denies any obvious fluctuation of symptoms with weather or environmental changes or other aggravating or alleviating factors except as outlined above   No unusual exposure hx or h/o childhood pna/ asthma or knowledge of premature birth.  Current Allergies, Complete Past Medical History, Past Surgical History, Family History, and Social History were reviewed in [employer] record.  ROS  The following are not active complaints unless bolded Hoarseness, sore throat, dysphagia, dental problems, itching, sneezing,  nasal congestion or discharge of excess mucus or purulent secretions, ear ache,   fever, chills, sweats, unintended wt loss or wt gain, classically pleuritic or exertional cp,  orthopnea pnd or arm/hand swelling  or leg swelling, presyncope, palpitations, abdominal pain, anorexia, nausea, vomiting, diarrhea  or change in bowel habits or change  in bladder habits, change in stools or change in urine, dysuria, hematuria,  rash, arthralgias, visual complaints, headache, numbness, weakness or ataxia or problems with walking or coordination,  change in mood or  memory.        Current Meds  Medication Sig   albuterol (PROVENTIL) (2.5 MG/3ML) 0.083% nebulizer solution Take 3 mLs (2.5 mg total) by nebulization every 6 (six) hours as needed for wheezing or shortness of breath.   atorvastatin (LIPITOR) 10 MG tablet Take 10 mg by mouth at bedtime.    diltiazem (CARDIZEM CD) 120 MG 24 hr capsule Take 120 mg by mouth daily.   ELIQUIS 5 MG TABS tablet Take 5 mg by mouth 2 (two) times daily.   levothyroxine (SYNTHROID, LEVOTHROID) 88 MCG tablet Take 88 mcg by mouth daily before breakfast.   loratadine (EQ ALLERGY RELIEF) 10 MG tablet Take 1 tablet by mouth once daily   meclizine (ANTIVERT) 12.5 MG tablet Take 12.5 mg by mouth every 8 (eight) hours as needed for dizziness.   Multiple Vitamin (MULTIVITAMIN WITH MINERALS) TABS tablet Take 1 tablet by mouth daily.    omeprazole (PRILOSEC) 40 MG capsule Take 40 mg by mouth daily.   oxyCODONE-acetaminophen (PERCOCET/ROXICET) 5-325 MG tablet Take by mouth every 4 (four) hours as needed for severe pain.   pantoprazole (PROTONIX) 40 MG tablet TAKE 1 TABLET BY MOUTH TWICE DAILY BEFORE  A  MEAL   TRELEGY ELLIPTA 100-62.5-25 MCG/INH AEPB Inhale 1 puff into the lungs daily.   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   26 mm Edwards Sapien 3 transcatheter heart valve placed via percutaneous left transfemoral approach   Sigmoid diverticulitis         Objective:        05/05/2022      not able to weigh   05/04/21 145 lb (65.8 kg)  05/01/21 145 lb  3.2 oz (65.9 kg)  07/23/20 140 lb 9.6 oz (63.8 kg)      Vital signs reviewed  05/05/2022  - Note at rest 02 sats  94% on 2lpm NP    General appearance:    w/c bound elderly wf nad / dentures       HEENT :  Oropharynx  clear / full dentures   Nasal turbinates nl    NECK :  without JVD/Nodes/TM/ nl carotid upstrokes bilaterally   LUNGS: no acc muscle use,  Mod barrel  contour chest wall with bilateral  Distant bs s audible wheeze and  without cough on insp or exp maneuvers and mod  Hyperresonant  to  percussion bilaterally     CV:  RRR  no s3  with 2/6 sem  or increase in P2, and no edema   ABD:  soft and nontender with pos mid insp Hoover's  in the supine position. No bruits or organomegaly appreciated, bowel sounds nl  MS:   Ext warm without deformities or   obvious joint restrictions , calf tenderness, cyanosis or clubbing  SKIN: warm and dry without lesions    NEURO:  alert, approp, nl sensorium with  no motor or cerebellar deficits apparent.              Assessment

## 2022-05-06 ENCOUNTER — Encounter: Payer: Self-pay | Admitting: Internal Medicine

## 2022-05-06 DIAGNOSIS — J9611 Chronic respiratory failure with hypoxia: Secondary | ICD-10-CM | POA: Insufficient documentation

## 2022-05-06 NOTE — Assessment & Plan Note (Signed)
Placed on 2lpm at d/c from lap chole 04/2022   Should be able to wean at least the daytime 02 for sat > 90% and will address this at next ov at which time hopes to be more ambulatory  In meantime advised of goals of 02 rx  In terms of monitoring her own sats to assure > 90% at peak exertion           Each maintenance medication was reviewed in detail including emphasizing most importantly the difference between maintenance and prns and under what circumstances the prns are to be triggered using an action plan format where appropriate.  Total time for H and P, chart review, counseling, reviewing hfa/neb/02 device(s) and generating customized AVS unique to this office visit / same day charting  > 30 min for post hosp f/u pt

## 2022-05-06 NOTE — Assessment & Plan Note (Signed)
D/c trelegy 05/05/2022  > max gerd rx   Upper airway cough syndrome (previously labeled PNDS),  is so named because it's frequently impossible to sort out how much is  CR/sinusitis with freq throat clearing (which can be related to primary GERD)   vs  causing  secondary (" extra esophageal")  GERD from wide swings in gastric pressure that occur with throat clearing, often  promoting self use of mint and menthol lozenges that reduce the lower esophageal sphincter tone and exacerbate the problem further in a cyclical fashion.   These are the same pts (now being labeled as having "irritable larynx syndrome" by some cough centers) who not infrequently have a history of having failed to tolerate ace inhibitors,  dry powder inhalers or biphosphonates or report having atypical/extraesophageal reflux symptoms that don't respond to standard doses of PPI  and are easily confused as having aecopd or asthma flares by even experienced allergists/ pulmonologists (myself included).   reviewe diet/ use of hard rock candy to prevent throat clearing

## 2022-05-06 NOTE — Assessment & Plan Note (Signed)
Quit smoking  02/2017 - PFT's  03/09/17  FEV1 1.13 (58 % ) ratio 0.59    with DLCO  11.80 (51%) corrects to 3.07 (65%)  for alv volume and FV curve mild concavity only   - 07/09/2020  After extensive coaching inhaler device,  effectiveness =    75% (short ti) -  07/09/2020   Walked RA  approx   600 ft  @ avg pace  stopped due to  End of study with sats 96%  At end -05/05/2022  After extensive coaching inhaler device,  effectiveness =    60% with hfa > try breztri 1-2 bid and d/c trelegy (dpi effects)  . Group D (now reclassified as E) in terms of symptom/risk and laba/lama/ICS  therefore appropriate rx at this point >>>  Try breztri as above plus GERD diet due to upper airway symptoms

## 2022-05-19 ENCOUNTER — Other Ambulatory Visit: Payer: Medicare Other

## 2022-05-19 ENCOUNTER — Telehealth: Payer: Self-pay | Admitting: Cardiology

## 2022-05-19 NOTE — Telephone Encounter (Signed)
Patient states that she was told that she had a prescription for eliquis that needed to be picked up and was unsure why this medication was sent in. States that eliquis was called in by [facility] facility for Afib. After looking through the chart, I informed the patient that according to her recent hospital note, she was diagnosed with Afib and started on eliquis as a result. States that she did not understand why whe was not told to follow up with her cardiologist. Informed patient that she has an upcoming appointment with Dr. Diona Browner on 9/18. States that she will pick up and start the medications and further discuss with Dr. Diona Browner at that visit.

## 2022-05-19 NOTE — Telephone Encounter (Signed)
Patient's home health nurse called about patient 's ELIQUIS 5 MG TABS tablet and diltiazem (CARDIZEM CD) 120 MG 24 hr capsule

## 2022-06-16 ENCOUNTER — Ambulatory Visit: Payer: Medicare Other | Admitting: Internal Medicine

## 2022-06-16 NOTE — Progress Notes (Deleted)
April Hull, female    DOB: Nov 13, 1940, 81 y.o.   MRN: [id number]   Brief patient profile:  79 yowf quit smoking 03/2017 with  GOLD 2 criteria copd then and maint on trelegy by Dr Delton Coombes referred to pulmonary clinic in [place]  07/09/2020 by Dr  Sherril Croon re possible abdominal surgery.       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[employer] a bit harder  Cough: none  Sleep: bed blocks fine  SABA use: not lately needing  Any at all 02 : none   Rec Stay as active as you possibly can  Plan A = Automatic = Always=    Trelegy one click each am  Plan B = Backup (to supplement plan A, not to replace it) Only use your albuterol inhaler as a rescue medication  Plan C = Crisis (instead of Plan B but only if Plan B stops working) - only use your albuterol nebulizer if you first try Plan B and it fails to help > ok to use the nebulizer up to every 4 hours but if start needing it regularly call for immediate appointment Plan D = Doctor  - call right away if ABC not working  Make sure you check your oxygen saturations at highest level of activity  Please schedule a follow up visit in 12 months but call sooner if needed     05/04/2021  f/u ov/[place] office/April Hull re: GOLD 2 copd maint on Trelegy  Chief Complaint  Patient presents with   Follow-up    Breathing is unchanged since the last visit. She is using her albuterol inhaler once per wk on average and has not used neb.   Dyspnea:  pushing buggy around wm Cough: none  Sleeping: bed blocks/ no resp cc  SABA use: rarely  02: none Covid status: x 2 vax  Rec Plan A = Automatic = Always=    trelegy one click each am  Plan B = Backup (to supplement plan A, not to replace it) Only use your albuterol inhaler as a rescue  medication Plan C = Crisis (instead of Plan B but only if Plan B stops working) - only use your albuterol nebulizer if you first try Plan B and it fails to help Plan D = Doctor - call me if B and C not adequate     05/05/2022  f/u ov/[place] office/April Hull re: GOLD 2 copd  maint on trelegy with worsening cough  / in SNF p lap chole  04/23/22  Chief Complaint  Patient presents with   Follow-up    Patient was in hospital recently. Breathing is about the same since last visit. Has been put on O2 since hospital visit and is at [facility] nursing facility with rehab. Not ambulating   Dyspnea:  4th day of SNF on 02 2lpm  Cough: throat irritation/ choking on dpi but no excess mucus  Sleeping: slt elevation hosp bed  SABA use: rarely 02: 2lpm  Rec Stop trelegy and start  Breztri Take 1-2 puffs first thing in am and then another 1-2 puffs about 12 hours later.  Work on inhaler technique:  Make sure you check your oxygen saturation  AT  your highest level of activity (not after you stop)  to be sure it stays over 90%  GERD diet  Please schedule a follow up office visit in 6 weeks, call sooner if needed - bring inhalers with you      06/16/2022  f/u ov/[place] office/April Hull re: *** maint on ***  No chief complaint on file.   Dyspnea:  *** Cough: *** Sleeping: *** SABA use: *** 02: *** Covid status: *** Lung cancer screening: ***   No obvious day to day or daytime variability or assoc excess/ purulent sputum or mucus plugs or hemoptysis or cp or chest tightness, subjective wheeze or overt sinus or hb symptoms.   *** without nocturnal  or early am exacerbation  of respiratory  c/o's or need for noct saba. Also denies any obvious fluctuation of symptoms with weather or environmental changes or other aggravating or alleviating factors except as outlined above   No unusual exposure hx or h/o childhood pna/ asthma or knowledge of premature birth.  Current Allergies, Complete Past Medical  History, Past Surgical History, Family History, and Social History were reviewed in [employer] record.  ROS  The following are not active complaints unless bolded Hoarseness, sore throat, dysphagia, dental problems, itching, sneezing,  nasal congestion or discharge of excess mucus or purulent secretions, ear ache,   fever, chills, sweats, unintended wt loss or wt gain, classically pleuritic or exertional cp,  orthopnea pnd or arm/hand swelling  or leg swelling, presyncope, palpitations, abdominal pain, anorexia, nausea, vomiting, diarrhea  or change in bowel habits or change in bladder habits, change in stools or change in urine, dysuria, hematuria,  rash, arthralgias, visual complaints, headache, numbness, weakness or ataxia or problems with walking or coordination,  change in mood or  memory.        No outpatient medications have been marked as taking for the 06/16/22 encounter (Appointment) with April Cowden, MD.                 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

## 2022-06-30 ENCOUNTER — Other Ambulatory Visit (HOSPITAL_COMMUNITY): Payer: Self-pay | Admitting: Internal Medicine

## 2022-06-30 DIAGNOSIS — I350 Nonrheumatic aortic (valve) stenosis: Secondary | ICD-10-CM

## 2022-07-02 ENCOUNTER — Ambulatory Visit (HOSPITAL_COMMUNITY)
Admission: RE | Admit: 2022-07-02 | Discharge: 2022-07-02 | Disposition: A | Discharge status: Home / Self Care | Payer: Medicare Other | Source: Ambulatory Visit | Attending: Internal Medicine | Admitting: Internal Medicine

## 2022-07-02 DIAGNOSIS — I080 Rheumatic disorders of both mitral and aortic valves: Secondary | ICD-10-CM | POA: Diagnosis not present

## 2022-07-02 DIAGNOSIS — I119 Hypertensive heart disease without heart failure: Secondary | ICD-10-CM | POA: Diagnosis not present

## 2022-07-02 DIAGNOSIS — I350 Nonrheumatic aortic (valve) stenosis: Secondary | ICD-10-CM

## 2022-07-04 LAB — ECHOCARDIOGRAM COMPLETE
AR max vel: 3.57 cm2
AV Area VTI: 2.49 cm2
AV Area mean vel: 2.22 cm2
AV Mean grad: 3.5 mmHg
AV Peak grad: 3.5 mmHg
Ao pk vel: 0.94 m/s
Area-P 1/2: 1.79 cm2
S' Lateral: 2.4 cm

## 2022-07-04 NOTE — Progress Notes (Deleted)
Cardiology Office Note  Date: 07/04/2022   ID: April Hull, DOB Aug 25, 1941, MRN [id number]  PCP:  [name], MD  Cardiologist:  [name], MD Electrophysiologist:  None   No chief complaint on file.   History of Present Illness: April Hull is an 81 y.o. female last seen in March.  Recent follow-up echocardiogram shows LVEF 65 to 70% with mild diastolic dysfunction, stable TAVR prosthesis with normal mean gradient and more evident mild paravalvular regurgitation.  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   26 mm Edwards Sapien 3 transcatheter heart valve placed via percutaneous left transfemoral approach   Sigmoid diverticulitis     Past Surgical History:  Procedure Laterality Date   ABDOMINAL HYSTERECTOMY  1972   APPENDECTOMY     COLONOSCOPY  10/15/2005   Rehman: Sigmoid colon diverticulosis, small polyp ablated via cold biopsy in the transverse colon   COLOSTOMY N/A 11/27/2014   Procedure: COLOSTOMY;  Surgeon: Harriette Bouillon, MD;  Location: [facility] OR;  Service: General;  Laterality: N/A;   COLOSTOMY TAKEDOWN N/A 04/09/2015   Procedure: LAPAROSCOPIC ASSISTED COLOSTOMY CLOSURE; RIGID SIGMOIDOSCOPY;  Surgeon: Harriette Bouillon, MD;  Location: [facility] OR;  Service: General;  Laterality: N/A;   ESOPHAGEAL DILATION  06/18/2020   Procedure: ESOPHAGEAL DILATION;  Surgeon: Corbin Ade, MD;  Location: [facility];  Service: Endoscopy;;   ESOPHAGOGASTRODUODENOSCOPY  10/15/2005   Rehman: Normal exam, status post esophageal dilation for history of dysphagia.    ESOPHAGOGASTRODUODENOSCOPY N/A 06/18/2020   Rourk: Moderate Schatzki ring status post dilation, medium sized hiatal hernia, scattered erosions and streaky erythema of the gastric mucosa with benign biopsies.  No H. pylori.   hammer toes Bilateral    6   LAPAROSCOPIC SIGMOID COLECTOMY N/A 11/27/2014   Procedure: LAPAROSCOPIC ASSISTED SIGMOID COLECTOMY;  Surgeon: Harriette Bouillon, MD;  Location: [facility] OR;  Service: General;  Laterality: N/A;   LUMBAR DISC SURGERY     x 2   RIGHT/LEFT HEART CATH AND CORONARY ANGIOGRAPHY N/A 03/04/2017   Procedure: Right/Left Heart Cath and Coronary Angiography;  Surgeon: Kathleene Hazel, MD;  Location: [facility] IN[facility]AS[facility] CV LAB;  Service: Cardiovascular;  Laterality: N/A;   ROTATOR CUFF REPAIR Left 2009   TEE WITHOUT CARDIOVERSION N/A 04/05/2017   Procedure: TRANSESOPHAGEAL ECHOCARDIOGRAM (TEE);  Surgeon: Kathleene Hazel, MD;  Location: [facility] OR;  Service: Open Heart Surgery;  Laterality: N/A;   TRANSCATHETER AORTIC VALVE REPLACEMENT, TRANSFEMORAL N/A 04/05/2017   Procedure: TRANSCATHETER AORTIC VALVE REPLACEMENT, TRANSFEMORAL;  Surgeon: Kathleene Hazel, MD;  Location: [facility] OR;  Service: Open Heart Surgery;  Laterality: N/A;    Current Outpatient Medications  Medication Sig Dispense Refill   albuterol (PROVENTIL) (2.5 MG/3ML) 0.083% nebulizer solution Take 3 mLs (2.5 mg total) by nebulization every 6 (six) hours as needed for wheezing or shortness of breath. 75 mL 3   atorvastatin (LIPITOR) 10 MG tablet Take 10 mg by mouth at bedtime.      Budeson-Glycopyrrol-Formoterol (BREZTRI AEROSPHERE) 160-9-4.8 MCG/ACT AERO 1-2 puffs every 12  hours 10.7 g 11   diltiazem (CARDIZEM CD) 120 MG 24 hr capsule Take 120 mg by mouth daily.     ELIQUIS 5 MG TABS tablet Take 5 mg by mouth 2 (two) times daily.     fluticasone (FLONASE) 50 MCG/ACT nasal spray Use 2 spray(s) in each nostril once daily (Patient not taking: Reported on 05/05/2022) 48 g 2   levothyroxine (SYNTHROID,  LEVOTHROID) 88 MCG tablet Take 88 mcg by mouth daily before breakfast.     loratadine (EQ ALLERGY RELIEF) 10 MG tablet Take 1 tablet by mouth once daily 90 tablet 3   meclizine (ANTIVERT) 12.5 MG tablet Take 12.5 mg by mouth every 8 (eight) hours as needed for dizziness.     Multiple Vitamin (MULTIVITAMIN WITH MINERALS) TABS tablet Take 1 tablet by mouth daily.      omeprazole (PRILOSEC) 40 MG capsule Take 40 mg by mouth daily.     oxyCODONE-acetaminophen (PERCOCET/ROXICET) 5-325 MG tablet Take by mouth every 4 (four) hours as needed for severe pain.     pantoprazole (PROTONIX) 40 MG tablet TAKE 1 TABLET BY MOUTH TWICE DAILY BEFORE  A  MEAL 180 tablet 2   valsartan (DIOVAN) 160 MG tablet Take 1 tablet by mouth once daily 30 tablet 0   VENTOLIN HFA 108 (90 Base) MCG/ACT inhaler INHALE 2 PUFFS BY MOUTH EVERY 4 HOURS AS NEEDED FOR WHEEZING AND FOR SHORTNESS OF BREATH 18 g 0   No current facility-administered medications for this visit.   Allergies:  Bactrim [sulfamethoxazole-trimethoprim], Clopidogrel bisulfate, Penicillin g, Sulfa antibiotics, Sulfamethoxazole-trimethoprim, Codeine, and Diphenhydramine hcl   Social History: The patient  reports that she quit smoking about 5 years ago. Her smoking use included cigarettes. She started smoking about 65 years ago. She has a 10.00 pack-year smoking history. She has never used smokeless tobacco. She reports that she does not drink alcohol and does not use drugs.   Family History: The patient's family history includes Breast cancer in her sister; CAD in her brother; COPD in her father; Congestive Heart Failure in her mother; Emphysema in her father; Heart disease in her sister; Uterine cancer in her daughter.   ROS:  Please see the history of present illness. Otherwise, complete review of systems is positive for {NONE DEFAULTED:18576}.  All other systems are reviewed and negative.   Physical Exam: VS:  There were no vitals taken for this visit., BMI  There is no height or weight on file to calculate BMI.  Wt Readings from Last 3 Encounters:  12/30/21 134 lb 9.6 oz (61.1 kg)  06/24/21 144 lb 3.2 oz (65.4 kg)  05/04/21 145 lb (65.8 kg)    General: Patient appears comfortable at rest. HEENT: Conjunctiva and lids normal, oropharynx clear with moist mucosa. Neck: Supple, no elevated JVP or carotid bruits, no thyromegaly. Lungs: Clear to auscultation, nonlabored breathing at rest. Cardiac: Regular rate and rhythm, no S3 or significant systolic murmur, no pericardial rub. Abdomen: Soft, nontender, no hepatomegaly, bowel sounds present, no guarding or rebound. Extremities: No pitting edema, distal pulses 2+. Skin: Warm and dry. Musculoskeletal: No kyphosis. Neuropsychiatric: Alert and oriented x3, affect grossly appropriate.  ECG:  An ECG dated 06/24/2021 was personally reviewed today and demonstrated:  Sinus rhythm with low voltage and decreased R wave progression.  Recent Labwork:  August 2023: Potassium 2.9, BUN 6, creatinine 0.55, magnesium 1.6, hemoglobin 10.5, platelets 223, pro-BNP 565, high-sensitivity troponin I 12  Other Studies Reviewed Today:  Echocardiogram 07/02/2022:  1. Left ventricular ejection  fraction, by estimation, is 65 to 70%. The  left ventricle has normal function. The left ventricle has no regional  wall motion abnormalities. There is mild concentric left ventricular  hypertrophy. Left ventricular diastolic  parameters are consistent with Grade I diastolic dysfunction (impaired  relaxation).   2. Right ventricular systolic function is normal. The right ventricular  size is normal. There is normal pulmonary artery systolic pressure. The  estimated right ventricular systolic pressure is 76.2 mmHg.   3. The mitral valve is abnormal. Mild to moderate mitral valve  regurgitation. Moderate mitral annular calcification.   4. The aortic valve has been repaired/replaced. Aortic valve  regurgitation is mild,  paravalvular between 1 and 3 o'clock in parasternal  short axis. There is a 26 mm Sapien prosthetic (TAVR) valve present in the  aortic position. Aortic valve mean  gradient measures 3.5 mmHg.   5. The inferior vena cava is normal in size with greater than 50%  respiratory variability, suggesting right atrial pressure of 3 mmHg.   Assessment and Plan:    Medication Adjustments/Labs and Tests Ordered: Current medicines are reviewed at length with the patient today.  Concerns regarding medicines are outlined above.   Tests Ordered: No orders of the defined types were placed in this encounter.   Medication Changes: No orders of the defined types were placed in this encounter.   Disposition:  Follow up {follow up:15908}  Signed, Satira Sark, MD, [facility] 07/04/2022 2:05 PM    [facility] [place], [place], [place] [address] Phone: [phone]; Fax: [phone]

## 2022-07-05 ENCOUNTER — Ambulatory Visit: Payer: Medicare Other | Admitting: Cardiology

## 2022-07-05 DIAGNOSIS — Z952 Presence of prosthetic heart valve: Secondary | ICD-10-CM

## 2022-09-17 DEATH — deceased
